# Patient Record
Sex: Female | Born: 1990
Health system: Southern US, Community
[De-identification: ages and names within clinical notes are randomized; demographics above are authoritative.]

## PROBLEM LIST (undated history)

## (undated) ENCOUNTER — Inpatient Hospital Stay (HOSPITAL_COMMUNITY): Payer: Self-pay

## (undated) DIAGNOSIS — I6350 Cerebral infarction due to unspecified occlusion or stenosis of unspecified cerebral artery: Secondary | ICD-10-CM

## (undated) DIAGNOSIS — Z9181 History of falling: Secondary | ICD-10-CM

## (undated) DIAGNOSIS — J45909 Unspecified asthma, uncomplicated: Secondary | ICD-10-CM

## (undated) DIAGNOSIS — F419 Anxiety disorder, unspecified: Secondary | ICD-10-CM

## (undated) DIAGNOSIS — K829 Disease of gallbladder, unspecified: Secondary | ICD-10-CM

## (undated) DIAGNOSIS — R531 Weakness: Secondary | ICD-10-CM

## (undated) DIAGNOSIS — F32A Depression, unspecified: Secondary | ICD-10-CM

## (undated) DIAGNOSIS — I639 Cerebral infarction, unspecified: Secondary | ICD-10-CM

## (undated) DIAGNOSIS — E669 Obesity, unspecified: Secondary | ICD-10-CM

## (undated) DIAGNOSIS — R7303 Prediabetes: Secondary | ICD-10-CM

## (undated) DIAGNOSIS — D580 Hereditary spherocytosis: Secondary | ICD-10-CM

## (undated) DIAGNOSIS — G4730 Sleep apnea, unspecified: Secondary | ICD-10-CM

## (undated) DIAGNOSIS — M419 Scoliosis, unspecified: Secondary | ICD-10-CM

## (undated) DIAGNOSIS — Z3480 Encounter for supervision of other normal pregnancy, unspecified trimester: Principal | ICD-10-CM

## (undated) DIAGNOSIS — O139 Gestational [pregnancy-induced] hypertension without significant proteinuria, unspecified trimester: Secondary | ICD-10-CM

## (undated) DIAGNOSIS — M549 Dorsalgia, unspecified: Secondary | ICD-10-CM

## (undated) DIAGNOSIS — E7800 Pure hypercholesterolemia, unspecified: Secondary | ICD-10-CM

## (undated) HISTORY — DX: Dorsalgia, unspecified: M54.9

## (undated) HISTORY — DX: Scoliosis, unspecified: M41.9

## (undated) HISTORY — DX: Neonatal cerebral infarction, unspecified side: P91.829

## (undated) HISTORY — DX: History of falling: Z91.81

## (undated) HISTORY — DX: Pure hypercholesterolemia, unspecified: E78.00

## (undated) HISTORY — DX: Obesity, unspecified: E66.9

## (undated) HISTORY — DX: Gestational (pregnancy-induced) hypertension without significant proteinuria, unspecified trimester: O13.9

## (undated) HISTORY — DX: Weakness: R53.1

## (undated) HISTORY — DX: Encounter for supervision of other normal pregnancy, unspecified trimester: Z34.80

## (undated) HISTORY — DX: Cerebral infarction, unspecified: I63.9

## (undated) HISTORY — PX: FOOT SURGERY: SHX648

## (undated) HISTORY — DX: Cerebral infarction due to unspecified occlusion or stenosis of unspecified cerebral artery: I63.50

## (undated) HISTORY — DX: Anxiety disorder, unspecified: F41.9

## (undated) HISTORY — DX: Hereditary spherocytosis: D58.0

## (undated) HISTORY — DX: Depression, unspecified: F32.A

## (undated) HISTORY — DX: Unspecified asthma, uncomplicated: J45.909

## (undated) HISTORY — PX: OTHER SURGICAL HISTORY: SHX169

## (undated) HISTORY — DX: Disease of gallbladder, unspecified: K82.9

## (undated) HISTORY — DX: Prediabetes: R73.03

---
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
## 2009-01-08 ENCOUNTER — Telehealth: Payer: Self-pay | Admitting: Internal Medicine

## 2009-01-09 ENCOUNTER — Encounter: Payer: Self-pay | Admitting: Internal Medicine

## 2009-01-16 DIAGNOSIS — M4120 Other idiopathic scoliosis, site unspecified: Secondary | ICD-10-CM | POA: Insufficient documentation

## 2009-07-03 ENCOUNTER — Ambulatory Visit: Payer: Self-pay | Admitting: Internal Medicine

## 2009-07-03 DIAGNOSIS — J029 Acute pharyngitis, unspecified: Secondary | ICD-10-CM | POA: Insufficient documentation

## 2009-07-03 DIAGNOSIS — R599 Enlarged lymph nodes, unspecified: Secondary | ICD-10-CM | POA: Insufficient documentation

## 2009-07-03 DIAGNOSIS — B2790 Infectious mononucleosis, unspecified without complication: Secondary | ICD-10-CM | POA: Insufficient documentation

## 2009-07-03 LAB — CONVERTED CEMR LAB: Rapid Strep: NEGATIVE

## 2009-07-04 ENCOUNTER — Encounter: Payer: Self-pay | Admitting: Internal Medicine

## 2009-07-15 ENCOUNTER — Ambulatory Visit: Payer: Self-pay | Admitting: Internal Medicine

## 2009-07-15 DIAGNOSIS — T50995A Adverse effect of other drugs, medicaments and biological substances, initial encounter: Secondary | ICD-10-CM | POA: Insufficient documentation

## 2009-07-15 DIAGNOSIS — R21 Rash and other nonspecific skin eruption: Secondary | ICD-10-CM | POA: Insufficient documentation

## 2009-08-12 ENCOUNTER — Ambulatory Visit: Payer: Self-pay | Admitting: Internal Medicine

## 2009-08-22 ENCOUNTER — Emergency Department (HOSPITAL_COMMUNITY): Admission: EM | Admit: 2009-08-22 | Discharge: 2009-08-22 | Payer: Self-pay | Admitting: Emergency Medicine

## 2010-03-31 ENCOUNTER — Observation Stay (HOSPITAL_COMMUNITY): Admission: AD | Admit: 2010-03-31 | Discharge: 2010-04-01 | Payer: Self-pay | Admitting: Obstetrics & Gynecology

## 2010-03-31 ENCOUNTER — Ambulatory Visit: Payer: Self-pay | Admitting: Obstetrics & Gynecology

## 2010-04-09 ENCOUNTER — Ambulatory Visit: Payer: Self-pay | Admitting: Family Medicine

## 2010-04-09 ENCOUNTER — Inpatient Hospital Stay (HOSPITAL_COMMUNITY): Admission: AD | Admit: 2010-04-09 | Discharge: 2010-04-12 | Payer: Self-pay | Admitting: Family Medicine

## 2011-02-16 ENCOUNTER — Ambulatory Visit (INDEPENDENT_AMBULATORY_CARE_PROVIDER_SITE_OTHER): Payer: PRIVATE HEALTH INSURANCE | Admitting: Internal Medicine

## 2011-02-16 ENCOUNTER — Encounter: Payer: Self-pay | Admitting: Internal Medicine

## 2011-02-16 VITALS — BP 120/80 | HR 66 | Wt 176.0 lb

## 2011-02-16 DIAGNOSIS — G8190 Hemiplegia, unspecified affecting unspecified side: Secondary | ICD-10-CM

## 2011-02-16 DIAGNOSIS — G8191 Hemiplegia, unspecified affecting right dominant side: Secondary | ICD-10-CM | POA: Insufficient documentation

## 2011-02-16 DIAGNOSIS — M25539 Pain in unspecified wrist: Secondary | ICD-10-CM

## 2011-02-16 DIAGNOSIS — S4990XA Unspecified injury of shoulder and upper arm, unspecified arm, initial encounter: Secondary | ICD-10-CM

## 2011-02-16 DIAGNOSIS — S4980XA Other specified injuries of shoulder and upper arm, unspecified arm, initial encounter: Secondary | ICD-10-CM

## 2011-02-16 NOTE — Progress Notes (Signed)
  Subjective:    Patient ID: Tina Mckee, female    DOB: 1991/06/06, 20 y.o.   MRN: [id number]  HPI  patient comes in today for the above problem. Her last visit with [place] was in 2010 since that time she has had vaginal delivery under child is now 37 months old.  She was in her usual state of health when about 3 weeks ago she slipped on ice on the steps x3 and fell mostly on her back but also on her right arm. The next day she noted pain in her right wrist and hand that is since then traveled up her forearm and has been significant. She describes it as a pain of 10 out of 10.   She doesn't remember bruising but there is swelling at her wrist arm area.   The right arm is paretic from a perinatal stroke. But she is weaker because of pain and she has having a hard time gripping doing her job and taking care of her child. Her shoulder and elbow do not hurt.  has taken an over-the-counter pain medicine without significant help. Past Medical History  Diagnosis Date  . Stroke birth    rt hemiparesis  Perinatal  . Scoliosis   . Perinatal arterial ischemic stroke    Past Surgical History  Procedure Date  . Foot surgery     rt foot    reports that she has quit smoking. She does not have any smokeless tobacco history on file. She reports that she drinks alcohol. She reports that she does not use illicit drugs. family history includes Asthma in her mother and sister and Hypertension in her father and mother.     Review of Systems  negative chest pain shortness of breath bruising bleeding other joint swelling. No history of fracture in her right arm.  Gets back pain from her scoliosis.    Objective:   Physical Exam  this is a well-nourished healthy-appearing young lady in no acute distress but has some moderate pain with her right extremity. Images a red mostly distally with some mild edema of the wrist. There is no redness or bruising. She is very tender along the medial and lateral breast and  distal forearm. Her elbow and shoulder are nontender. Examination is limited by her weakness. No obvious paresthesia and pulses are good.       Assessment & Plan:   right upper extremity injury he appears to be near the wrist and hand and a previously progress upper extremity with continuing severe pain.    She will need imaging and a consult Will refer to orthopedics as soon as possible.

## 2011-03-03 LAB — COMPREHENSIVE METABOLIC PANEL
ALT: 19 U/L (ref 0–35)
AST: 22 U/L (ref 0–37)
Albumin: 2.4 g/dL — ABNORMAL LOW (ref 3.5–5.2)
Albumin: 2.8 g/dL — ABNORMAL LOW (ref 3.5–5.2)
Albumin: 3.1 g/dL — ABNORMAL LOW (ref 3.5–5.2)
Alkaline Phosphatase: 74 U/L (ref 39–117)
Alkaline Phosphatase: 87 U/L (ref 39–117)
BUN: 11 mg/dL (ref 6–23)
BUN: 9 mg/dL (ref 6–23)
Calcium: 6.4 mg/dL — CL (ref 8.4–10.5)
Chloride: 105 mEq/L (ref 96–112)
Creatinine, Ser: 0.47 mg/dL (ref 0.4–1.2)
Creatinine, Ser: 0.57 mg/dL (ref 0.4–1.2)
GFR calc Af Amer: >60 mL/min (ref >60)
Glucose, Bld: 75 mg/dL (ref 70–99)
Glucose, Bld: 85 mg/dL (ref 70–99)
Potassium: 3.7 mEq/L (ref 3.5–5.1)
Sodium: 136 mEq/L (ref 135–145)
Total Bilirubin: 1 mg/dL (ref 0.3–1.2)
Total Protein: 4.7 g/dL — ABNORMAL LOW (ref 6.0–8.3)
Total Protein: 5.6 g/dL — ABNORMAL LOW (ref 6.0–8.3)
Total Protein: 6.2 g/dL (ref 6.0–8.3)

## 2011-03-03 LAB — CBC
HCT: 36.9 % (ref 36.0–46.0)
HCT: 39.8 % (ref 36.0–46.0)
Hemoglobin: 12.8 g/dL (ref 12.0–15.0)
Hemoglobin: 13.4 g/dL (ref 12.0–15.0)
MCHC: 34.7 g/dL (ref 30.0–36.0)
MCV: 90.6 fL (ref 78.0–100.0)
MCV: 91.4 fL (ref 78.0–100.0)
MCV: 91.6 fL (ref 78.0–100.0)
Platelets: 232 10*3/uL (ref 150–400)
Platelets: 260 10*3/uL (ref 150–400)
Platelets: 280 10*3/uL (ref 150–400)
Platelets: 283 10*3/uL (ref 150–400)
RBC: 4.16 MIL/uL (ref 3.87–5.11)
RDW: 13 % (ref 11.5–15.5)
RDW: 13.2 % (ref 11.5–15.5)
RDW: 13.2 % (ref 11.5–15.5)
WBC: 16.3 10*3/uL — ABNORMAL HIGH (ref 4.0–10.5)

## 2011-03-03 LAB — CREATININE CLEARANCE, URINE, 24 HOUR
Collection Interval-CRCL: 24 hours
Creatinine Clearance: 184 mL/min — ABNORMAL HIGH (ref 75–115)
Creatinine, 24H Ur: 1191 mg/d (ref 700–1800)
Creatinine, Urine: 50.7 mg/dL
Urine Total Volume-CRCL: 2350 mL

## 2011-03-03 LAB — URINALYSIS, DIPSTICK ONLY
Bilirubin Urine: NEGATIVE
Nitrite: NEGATIVE
Specific Gravity, Urine: 1.025 (ref 1.005–1.030)
pH: 6.5 (ref 5.0–8.0)

## 2011-03-03 LAB — RPR: RPR Ser Ql: NONREACTIVE

## 2011-03-03 LAB — RH IMMUNE GLOB WKUP(>/=20WKS)(NOT WOMEN'S HOSP): Fetal Screen: NEGATIVE

## 2011-03-03 LAB — STREP B DNA PROBE: Strep Group B Ag: NEGATIVE

## 2011-03-03 LAB — PROTEIN, URINE, 24 HOUR
Collection Interval-UPROT: 24 hours
Protein, Urine: 10 mg/dL

## 2011-03-03 LAB — URIC ACID: Uric Acid, Serum: 4.8 mg/dL (ref 2.4–7.0)

## 2011-03-03 LAB — MRSA PCR SCREENING: MRSA by PCR: NEGATIVE

## 2011-07-03 ENCOUNTER — Telehealth: Payer: Self-pay | Admitting: Internal Medicine

## 2011-07-03 NOTE — Telephone Encounter (Signed)
Per Dr. Fabian Sharp- can do prior to cpx or if she wants we can wait until the day of the visit.

## 2011-07-03 NOTE — Telephone Encounter (Signed)
Pt reschedule cpx.. There was no lab attached does pt need lab prior to cpx? Please contact.

## 2011-07-06 NOTE — Telephone Encounter (Signed)
Let message for pt to contact and schedule lab.

## 2011-07-07 ENCOUNTER — Other Ambulatory Visit: Payer: Self-pay | Admitting: Obstetrics and Gynecology

## 2011-07-07 ENCOUNTER — Ambulatory Visit: Payer: Self-pay | Admitting: Internal Medicine

## 2011-07-11 ENCOUNTER — Encounter (HOSPITAL_COMMUNITY): Payer: Self-pay | Admitting: Emergency Medicine

## 2011-07-11 ENCOUNTER — Emergency Department (HOSPITAL_COMMUNITY)
Admission: EM | Admit: 2011-07-11 | Discharge: 2011-07-11 | Disposition: A | Discharge status: Home / Self Care | Payer: PRIVATE HEALTH INSURANCE | Attending: Emergency Medicine | Admitting: Emergency Medicine

## 2011-07-11 DIAGNOSIS — Z8679 Personal history of other diseases of the circulatory system: Secondary | ICD-10-CM | POA: Insufficient documentation

## 2011-07-11 DIAGNOSIS — M2550 Pain in unspecified joint: Secondary | ICD-10-CM

## 2011-07-11 DIAGNOSIS — M25559 Pain in unspecified hip: Secondary | ICD-10-CM | POA: Insufficient documentation

## 2011-07-11 DIAGNOSIS — M25539 Pain in unspecified wrist: Secondary | ICD-10-CM | POA: Insufficient documentation

## 2011-07-11 MED ORDER — ONDANSETRON HCL 4 MG PO TABS
4.0000 mg | ORAL_TABLET | Freq: Once | ORAL | Status: AC
  Administered 2011-07-11: 4 mg via ORAL
  Filled 2011-07-11: qty 1

## 2011-07-11 MED ORDER — IBUPROFEN 800 MG PO TABS
800.0000 mg | ORAL_TABLET | Freq: Once | ORAL | Status: AC
  Administered 2011-07-11: 800 mg via ORAL
  Filled 2011-07-11: qty 1

## 2011-07-11 MED ORDER — HYDROCODONE-ACETAMINOPHEN 5-325 MG PO TABS: ORAL_TABLET | ORAL | Status: DC

## 2011-07-11 MED ORDER — HYDROCODONE-ACETAMINOPHEN 5-325 MG PO TABS
2.0000 | ORAL_TABLET | Freq: Once | ORAL | Status: AC
  Administered 2011-07-11: 2 via ORAL
  Filled 2011-07-11: qty 2

## 2011-07-11 MED ORDER — MELOXICAM 7.5 MG PO TABS: ORAL_TABLET | ORAL | Status: DC

## 2011-07-11 NOTE — ED Provider Notes (Signed)
History     Chief Complaint  Patient presents with  . Wrist Pain  . Hip Pain   HPI Comments: Pt report hx of CVA at birth involving weakness of the right upper ext. 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 She does report hx of a fall a few months ago, but not recent injury.  Patient is a 20 y.o. female presenting with wrist pain and hip pain.  Wrist Pain Associated symptoms include arthralgias, myalgias and weakness. Pertinent negatives include no abdominal pain, chest pain, coughing or neck pain.  Hip Pain Associated symptoms include arthralgias, myalgias and weakness. Pertinent negatives include no abdominal pain, chest pain, coughing or neck pain.    Past Medical History  Diagnosis Date  . Stroke birth    rt hemiparesis  Perinatal  . Scoliosis   . Perinatal arterial ischemic stroke     Past Surgical History  Procedure Date  . Foot surgery     rt foot  . Foot surgery     Bone placed    Family History  Problem Relation Age of Onset  . Hypertension Mother   . Asthma Mother   . Hypertension Father   . Asthma Sister     History  Substance Use Topics  . Smoking status: Former Smoker    Quit date: 07/11/2007  . Smokeless tobacco: Never Used  . Alcohol Use: 0.5 oz/week    1 drink(s) per week     weekends every 3 weeks    OB History    Grav Para Term Preterm Abortions TAB SAB Ect Mult Living   1 1  1             Review of Systems  Constitutional: Negative for activity change.       All ROS Neg except as noted in HPI  HENT: Negative for nosebleeds and neck pain.   Eyes: Negative for photophobia and discharge.  Respiratory: Negative for cough, shortness of breath and wheezing.   Cardiovascular: Negative for chest pain and palpitations.  Gastrointestinal: Negative for abdominal pain and blood in stool.  Genitourinary: Negative for dysuria, frequency and hematuria.  Musculoskeletal: Positive for myalgias and arthralgias.  Negative for back pain.  Skin: Negative.   Neurological: Positive for weakness. Negative for dizziness, seizures and speech difficulty.  Psychiatric/Behavioral: Negative for hallucinations and confusion.    Physical Exam  BP 139/90  Pulse 91  Temp(Src) 98.7 F (37.1 C) (Oral)  Resp 18  Ht 5\' 1"  (1.549 m)  Wt 187 lb (84.823 kg)  BMI 35.33 kg/m2  SpO2 98%  LMP 07/06/2011  Physical Exam  Nursing note and vitals reviewed. Constitutional: She is oriented to person, place, and time. She appears well-developed and well-nourished.  Non-toxic appearance.  HENT:  Head: Normocephalic.  Right Ear: Tympanic membrane and external ear normal.  Left Ear: Tympanic membrane and external ear normal.  Eyes: EOM and lids are normal. Pupils are equal, round, and reactive to light.  Neck: Normal range of motion. Neck supple. Carotid bruit is not present.  Cardiovascular: Normal rate, regular rhythm, normal heart sounds, intact distal pulses and normal pulses.   Pulmonary/Chest: Breath sounds normal. No respiratory distress.  Abdominal: Soft. Bowel sounds are normal. There is no tenderness. There is no guarding.  Musculoskeletal: Normal range of motion.  Lymphadenopathy:       Head (right side): No submandibular adenopathy present.       Head (left side): No  submandibular adenopathy present.    She has no cervical adenopathy.  Neurological: She is alert and oriented to person, place, and time. She has normal strength. No cranial nerve deficit or sensory deficit.  Skin: Skin is warm and dry.  Psychiatric: She has a normal mood and affect. Her speech is normal.    ED Course  Procedures  MDM       Kathie Dike, [place] 07/17/11 1326

## 2011-07-11 NOTE — ED Notes (Signed)
Patient c/o right wrist/hand and right hip pain. Patient states "I feel a couple of months ago and I have had pain in my wrist and hand ever since. I have been to the doctors multiple times and they can't find anything wrong. This morning I woke up and It was hurting really bad.

## 2011-07-11 NOTE — ED Notes (Signed)
Pt states that she fell in may, had xrays done at [facility] orthopedic, was told that is may be soft tissue damage, has a splint for  right wrist that pt is not wearing, , pt c/o pain to right wrist and right hip area, denies any new injury, states that the splint makes that pain worse,

## 2011-07-31 ENCOUNTER — Ambulatory Visit (INDEPENDENT_AMBULATORY_CARE_PROVIDER_SITE_OTHER): Payer: PRIVATE HEALTH INSURANCE | Admitting: Family Medicine

## 2011-07-31 ENCOUNTER — Encounter: Payer: Self-pay | Admitting: Family Medicine

## 2011-07-31 VITALS — BP 118/80 | HR 90 | Temp 98.4°F | Wt 181.0 lb

## 2011-07-31 DIAGNOSIS — M549 Dorsalgia, unspecified: Secondary | ICD-10-CM

## 2011-07-31 MED ORDER — OXYCODONE-ACETAMINOPHEN 10-325 MG PO TABS: 1.0000 | ORAL_TABLET | Freq: Four times a day (QID) | ORAL | Status: DC | PRN

## 2011-07-31 MED ORDER — PREDNISONE (PAK) 10 MG PO TABS: ORAL_TABLET | ORAL | Status: DC

## 2011-07-31 MED ORDER — KETOROLAC TROMETHAMINE 60 MG/2ML IM SOLN
60.0000 mg | Freq: Once | INTRAMUSCULAR | Status: AC
  Administered 2011-07-31: 60 mg via INTRAMUSCULAR

## 2011-07-31 NOTE — ED Provider Notes (Signed)
Medical screening examination/treatment/procedure(s) were conducted as a shared visit with non-physician practitioner(s) and myself.  I personally evaluated the patient during the encounter  Doug Sou, MD 07/31/11 [phone]

## 2011-07-31 NOTE — Progress Notes (Signed)
  Subjective:    Patient ID: Tina Mckee, female    DOB: [birth year], 20 y.o.   MRN: [id number]  HPI Here for the onset 10 days ago of severe pains in the neck and upper back which radiate down the left arm. There is a lot of spasm and stiffness in the neck and upper back. She has scoliosis and is used to chronic back pain, but this pain is new for her. No recent trauma. She is getting no relief with heat, Mobic, and Norco.    Review of Systems  Constitutional: Negative.   Musculoskeletal: Positive for back pain.       Objective:   Physical Exam  Constitutional:       In a lot of pain  Musculoskeletal:       She has scoliosis with a pronounced spinal curvature. She is very tender along the entire neck and upper back with spasm greatest along the left side. Very reduced ROM.           Assessment & Plan:  Acute pain which indicated pinched nerves along the neck and upper back, on top of scoliosis. We will write her oput of work over this weekend. Use heat. Given a Toradol shot. Try a Prednisone taper pack and Percocet for pain. Follow up with Dr. Fabian Sharp on Monday

## 2011-08-03 ENCOUNTER — Ambulatory Visit (INDEPENDENT_AMBULATORY_CARE_PROVIDER_SITE_OTHER): Payer: PRIVATE HEALTH INSURANCE | Admitting: Internal Medicine

## 2011-08-03 ENCOUNTER — Encounter: Payer: Self-pay | Admitting: Internal Medicine

## 2011-08-03 VITALS — BP 120/80 | HR 72 | Ht 62.0 in | Wt 186.0 lb

## 2011-08-03 DIAGNOSIS — G8191 Hemiplegia, unspecified affecting right dominant side: Secondary | ICD-10-CM

## 2011-08-03 DIAGNOSIS — G8190 Hemiplegia, unspecified affecting unspecified side: Secondary | ICD-10-CM

## 2011-08-03 DIAGNOSIS — Z0000 Encounter for general adult medical examination without abnormal findings: Secondary | ICD-10-CM

## 2011-08-03 DIAGNOSIS — M5412 Radiculopathy, cervical region: Secondary | ICD-10-CM | POA: Insufficient documentation

## 2011-08-03 DIAGNOSIS — M4120 Other idiopathic scoliosis, site unspecified: Secondary | ICD-10-CM

## 2011-08-03 DIAGNOSIS — IMO0002 Reserved for concepts with insufficient information to code with codable children: Secondary | ICD-10-CM

## 2011-08-03 DIAGNOSIS — Z23 Encounter for immunization: Secondary | ICD-10-CM

## 2011-08-03 LAB — LIPID PANEL
LDL Cholesterol: 82 mg/dL (ref 0–99)
Total CHOL/HDL Ratio: 3
Triglycerides: 118 mg/dL (ref 0.0–149.0)

## 2011-08-03 LAB — TSH: TSH: 0.52 u[IU]/mL (ref 0.35–5.50)

## 2011-08-03 LAB — CBC WITH DIFFERENTIAL/PLATELET
Basophils Relative: 0.2 % (ref 0.0–3.0)
Eosinophils Relative: 0 % (ref 0.0–5.0)
MCV: 88.2 fl (ref 78.0–100.0)
Monocytes Relative: 4 % (ref 3.0–12.0)
Neutrophils Relative %: 84.2 % — ABNORMAL HIGH (ref 43.0–77.0)
Platelets: 467 10*3/uL — ABNORMAL HIGH (ref 150.0–400.0)
RBC: 4.64 Mil/uL (ref 3.87–5.11)
WBC: 15.9 10*3/uL — ABNORMAL HIGH (ref 4.5–10.5)

## 2011-08-03 LAB — BASIC METABOLIC PANEL
CO2: 25 mEq/L (ref 19–32)
Calcium: 9.1 mg/dL (ref 8.4–10.5)
Chloride: 104 mEq/L (ref 96–112)
Creatinine, Ser: 0.6 mg/dL (ref 0.4–1.2)
Sodium: 140 mEq/L (ref 135–145)

## 2011-08-03 LAB — HEPATIC FUNCTION PANEL
ALT: 20 U/L (ref 0–35)
Alkaline Phosphatase: 67 U/L (ref 39–117)
Bilirubin, Direct: 0.1 mg/dL (ref 0.0–0.3)
Total Bilirubin: 0.6 mg/dL (ref 0.3–1.2)
Total Protein: 7 g/dL (ref 6.0–8.3)

## 2011-08-03 NOTE — Patient Instructions (Signed)
I agree with seeing specialist about your arm and neck pain and numbness.  Will notify you  of labs when available. When convenient can schedule for pap and pelvic exam .  lifestyle intervention healthy eating and exercise  In the meantime

## 2011-08-03 NOTE — Progress Notes (Signed)
Subjective:    Patient ID: Tina Mckee, female    DOB: 06/13/1991, 20 y.o.   MRN: [id number]  HPI Patient comes in today for preventive visit and followup of medical problems. She was seen by Dr. Clent Ridges last week for increasing neck pain left arm pain and numbness. 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  And then  Having left arm pain and numbness left arm  that has progressed to the point of more severe pain affecting her sleep. Sometimes it  feels weak and then hip hurts. She was placed on prednisone and pain medication. She's been on this at least 3 days Not better at all.(  Hip and arm still hurts from fall in   February. ) Denies any weakness new in her leg. Her right hemiparesis from birth is no change. Has been difficult at work to do her job because of the above.   Her last Pap smear was before her child was born and she is 33 months old. No history of abnormals.   Is on her period today so not doing Pap. Uses Camila and condoms for contraception she does not use tobacco.   Review of Systems ROS:  GEN/ HEENTNo fever, significant weight changes sweats headaches vision problems hearing changes, CV/ PULM; No chest pain shortness of breath cough, syncope,edema  change in exercise tolerance. GI /GU: No adominal pain, vomiting, change in bowel habits. No blood in the stool. No significant GU symptoms. SKIN/HEME: ,no acute skin rashes suspicious lesions or bleeding. No lymphadenopathy, nodules, masses.  NEURO/ PSYCH:   No depression anxiety. Except as related to above IMM/ Allergy: No unusual infections.  Allergy .   REST of 12 system review negative Past history family history social history reviewed in the electronic medical record. Past Medical History  Diagnosis Date  . Stroke birth    rt hemiparesis  Perinatal  . Scoliosis   . Perinatal arterial ischemic stroke    Past Surgical History  Procedure Date  . Foot surgery     rt foot  . Foot surgery     Bone placed    reports that  she quit smoking about 4 years ago. She has never used smokeless tobacco. She reports that she drinks about .5 ounces of alcohol per week. She reports that she does not use illicit drugs. family history includes Asthma in her mother and sister and Hypertension in her father and mother. Allergies  Allergen Reactions  . Cephalexin     REACTION: rash  with mono        Objective:   Physical Exam Physical Exam: Vital signs reviewed WUJ:WJXB is a  well-nourished alert cooperative  white female who appears her stated age in no acute distress. mild discomfort here with her 55 m old. Has right hemiparesis and weakness and some atrophy right arm.  HEENT: normocephalic  traumatic , Eyes: PERRL EOM's full, conjunctiva clear, Nares: paten,t no deformity discharge or tenderness., Ears: no deformity EAC's clear TMs with normal landmarks. Mouth: clear OP, no lesions, edema.  Moist mucous membranes. Dentition in adequate repair. NECK: supple without masses, thyromegaly or bruits. Tender left neck trapezius CHEST/PULM:  Clear to auscultation and percussion breath sounds equal no wheeze , rales or rhonchi. No chest wall deformities or tenderness. CV: PMI is nondisplaced, S1 S2 no gallops, murmurs, rubs. Peripheral pulses are full without delay.No JVD .  Breast: normal by inspection . No dimpling, discharge, masses, tenderness or discharge . LN: no cervical  axillary inguinal adenopathy ABDOMEN: Bowel sounds normal nontender  No guard or rebound, no hepato splenomegal no CVA tenderness.  No hernia. Extremtities:  No clubbing cyanosis or edema, no acute joint swelling or redness right UE with some atrophy strength dec right ? Seems normal left . NEURO:  Oriented x3, cranial nerves 3-12 appear to be intact, right  Hemiparesis rue more than le.    Reflex brisker on right more than left one beat clonus right foot.  SKIN: No acute rashes normal turgor, color, no bruising or petechiae. Has facial erythema no papules    PSYCH: Oriented, good eye contact, no obvious depression anxiety, cognition and judgment appear normal. Some stress with pain issue     Assessment & Plan:  Preventive Health Care Counseled regarding healthy nutrition, exercise, sleep, injury prevention, calcium vit d and healthy weight .  Contraception  Return when convenient for pap/pelvic   get cpx labs today realizing she is on prednisone  Could change wbc and bg .  Left neck pain with radiating arm pain and numbness  Concern for radiculopathy  In her normal extremity. ( right hemiparesis)  No response to pred at this time. Disc referral and will have Dr Ophelia Charter see her tomorrow   And management as per her.   Stable right hemiparesis from  Perinatal cva.

## 2011-08-05 ENCOUNTER — Encounter: Payer: Self-pay | Admitting: *Deleted

## 2011-09-10 ENCOUNTER — Telehealth: Payer: Self-pay | Admitting: Internal Medicine

## 2011-09-10 NOTE — Telephone Encounter (Signed)
Pt was recently in for her physical but could not receive a pap because of her menstrual cycle. Pt is wanting to come back in to receive her pap. How does this need to be scheduled?

## 2011-09-10 NOTE — Telephone Encounter (Signed)
Please schedule her in a 15 min pap only appt per me.

## 2011-09-21 ENCOUNTER — Encounter: Payer: Self-pay | Admitting: Internal Medicine

## 2011-09-21 ENCOUNTER — Other Ambulatory Visit (HOSPITAL_COMMUNITY)
Admission: RE | Admit: 2011-09-21 | Discharge: 2011-09-21 | Disposition: A | Discharge status: Home / Self Care | Payer: PRIVATE HEALTH INSURANCE | Source: Ambulatory Visit | Attending: Internal Medicine | Admitting: Internal Medicine

## 2011-09-21 ENCOUNTER — Ambulatory Visit (INDEPENDENT_AMBULATORY_CARE_PROVIDER_SITE_OTHER): Payer: PRIVATE HEALTH INSURANCE | Admitting: Internal Medicine

## 2011-09-21 DIAGNOSIS — Z0000 Encounter for general adult medical examination without abnormal findings: Secondary | ICD-10-CM

## 2011-09-21 DIAGNOSIS — R739 Hyperglycemia, unspecified: Secondary | ICD-10-CM

## 2011-09-21 DIAGNOSIS — Z01419 Encounter for gynecological examination (general) (routine) without abnormal findings: Secondary | ICD-10-CM | POA: Insufficient documentation

## 2011-09-21 DIAGNOSIS — IMO0002 Reserved for concepts with insufficient information to code with codable children: Secondary | ICD-10-CM

## 2011-09-21 DIAGNOSIS — Z23 Encounter for immunization: Secondary | ICD-10-CM

## 2011-09-21 DIAGNOSIS — R7309 Other abnormal glucose: Secondary | ICD-10-CM | POA: Insufficient documentation

## 2011-09-21 DIAGNOSIS — M4120 Other idiopathic scoliosis, site unspecified: Secondary | ICD-10-CM

## 2011-09-21 DIAGNOSIS — M5412 Radiculopathy, cervical region: Secondary | ICD-10-CM

## 2011-09-21 DIAGNOSIS — R296 Repeated falls: Secondary | ICD-10-CM | POA: Insufficient documentation

## 2011-09-21 DIAGNOSIS — G8190 Hemiplegia, unspecified affecting unspecified side: Secondary | ICD-10-CM

## 2011-09-21 DIAGNOSIS — R279 Unspecified lack of coordination: Secondary | ICD-10-CM

## 2011-09-21 DIAGNOSIS — W19XXXA Unspecified fall, initial encounter: Secondary | ICD-10-CM

## 2011-09-21 DIAGNOSIS — G8191 Hemiplegia, unspecified affecting right dominant side: Secondary | ICD-10-CM

## 2011-09-21 LAB — GLUCOSE, POCT (MANUAL RESULT ENTRY): POC Glucose: 105

## 2011-09-21 MED ORDER — NORETHINDRONE 0.35 MG PO TABS: 1.0000 | ORAL_TABLET | Freq: Every day | ORAL | Status: DC

## 2011-09-21 NOTE — Progress Notes (Signed)
  Subjective:    Patient ID: Tina Mckee, female    DOB: 11-12-1991, 20 y.o.   MRN: [id number]  HPI Comes in today for GYNE exam and also follow up of ms pain.   Since last visit has seen Dr Ophelia Charter and had  x rays done.  So far treatment  Plan  not  Helpful in getting better .  Taking pain meds but has pain in  left side continuing  ; after fall on right.  Has cut back hours of work because of this.  No period issues at present . Needs refill of camila.  recurrent falling even at home unsure why she is doing this now.  Used to see Dr Sharene Skeans last time in HS .  No headache new numbness . Concerned because of her 26 month old at home.    Review of Systems Neg cp sob fever uti other gu sx . Pain back  No head injury, no change in vision  Hearing unusual headaches . Joint swelling bleeding.  Past history family history social history reviewed in the electronic medical record.     Objective:   Physical Exam  WDWN in nad Abdomen:  Sof,t normal bowel sounds without hepatosplenomegaly, no guarding rebound or masses no CVA tenderness Oriented x 3 right hemiparesis some spasticity  Or right side no tremor  Pelvic: NL ext GU, labia clear without lesions or rash . Vagina no lesions .Cervix: clear  UTERUS: Neg CMT Adnexa:  clear no masses . PAP done. Labs reviewed with patient. bg fbs 105      Assessment & Plan:  Nl gyne exam pap done  Persistent pain after fall .  Recurring falling  Out of range of normal per patient.   Advise neuro consult   May need to transition to adult neurologist ( used to see Dr Sharene Skeans  And he may be only seeing peds patients at this time)   Hyperglycemia  Fasting   Counseled. About prevention of getting diabetes etc. Stable hemiparesis

## 2011-09-21 NOTE — Patient Instructions (Addendum)
Avoid simple sugars and sweets to  Avoid getting diabetes. Will get  Neuro consult Will notify you  of PAP when available. Check up in a year or as needed

## 2011-09-23 ENCOUNTER — Encounter: Payer: Self-pay | Admitting: Internal Medicine

## 2011-09-23 DIAGNOSIS — R739 Hyperglycemia, unspecified: Secondary | ICD-10-CM | POA: Insufficient documentation

## 2011-09-23 NOTE — Progress Notes (Signed)
Quick Note:  Tell patient PAP is normal. ______ 

## 2011-09-24 ENCOUNTER — Encounter: Payer: Self-pay | Admitting: *Deleted

## 2012-04-08 ENCOUNTER — Telehealth: Payer: Self-pay | Admitting: Internal Medicine

## 2012-04-08 NOTE — Telephone Encounter (Signed)
Pt needs a doctors note stating that she had a cpx in August of last year, for her work. Pt also needs to get the doctors report from that visit. Pls fax to [facility] : attn Marianne Sofia at Fax # [phone].

## 2012-04-08 NOTE — Telephone Encounter (Signed)
Pls advise.  

## 2012-04-08 NOTE — Telephone Encounter (Signed)
Ok to do what patient asks for . Need to sign relase for her notes.  ( 2 visits)  (NO t sure why I need to give the ok for this)

## 2012-04-11 ENCOUNTER — Ambulatory Visit (INDEPENDENT_AMBULATORY_CARE_PROVIDER_SITE_OTHER): Payer: PRIVATE HEALTH INSURANCE | Admitting: Internal Medicine

## 2012-04-11 ENCOUNTER — Telehealth: Payer: Self-pay | Admitting: Internal Medicine

## 2012-04-11 ENCOUNTER — Encounter: Payer: Self-pay | Admitting: Internal Medicine

## 2012-04-11 VITALS — BP 128/86 | HR 94 | Temp 98.2°F | Wt 195.0 lb

## 2012-04-11 DIAGNOSIS — G8190 Hemiplegia, unspecified affecting unspecified side: Secondary | ICD-10-CM

## 2012-04-11 DIAGNOSIS — R739 Hyperglycemia, unspecified: Secondary | ICD-10-CM

## 2012-04-11 DIAGNOSIS — G8191 Hemiplegia, unspecified affecting right dominant side: Secondary | ICD-10-CM

## 2012-04-11 DIAGNOSIS — N390 Urinary tract infection, site not specified: Secondary | ICD-10-CM

## 2012-04-11 DIAGNOSIS — R609 Edema, unspecified: Secondary | ICD-10-CM

## 2012-04-11 DIAGNOSIS — R7309 Other abnormal glucose: Secondary | ICD-10-CM

## 2012-04-11 LAB — POCT URINALYSIS DIPSTICK
Nitrite, UA: POSITIVE
Protein, UA: NEGATIVE
Spec Grav, UA: 1.025
Urobilinogen, UA: 1

## 2012-04-11 NOTE — Telephone Encounter (Addendum)
Triage call:  Pt states that both her hands are swollen up to wrist with tingling since last night.  No other symtoms.   Please let me know when we can see her? Phone note given to [facility] to discuss with Dr. Fabian Sharp.

## 2012-04-11 NOTE — Telephone Encounter (Signed)
Pt came into the office to sign release for paperwork.

## 2012-04-11 NOTE — Telephone Encounter (Signed)
Pt hands are swollen requesting ov today.

## 2012-04-11 NOTE — Telephone Encounter (Signed)
Pt is sch with Dr Fabian Sharp 3pm

## 2012-04-11 NOTE — Progress Notes (Signed)
  Subjective:    Patient ID: Tina Mckee, female    DOB: [birth year], 21 y.o.   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 She had noted the onset yesterday evening of some swelling in her hands after her daughter's 42-year-old birthday party. She went to bed and this morning had puffy hands but was associated with tingly feeling and harder to feel but no severe pain she also noted that her feet are puffy. She has taken no medication for this. No injury but does work at [employer] as a Conservation officer, nature. She only works a few days a week.  She has not had this problem before although  No chest pain new shortness of breath swelling in her face high as cough chest pain vomiting diarrhea or falling. She has not taken Loxitane on for about 3 weeks. She is not on narcotics for her pain.  She is on Camila. R. irregular missing at times. Notes that her blood pressure has been borderline elevated and she had a history of preeclampsia with her pregnancy and that her glucose was slightly elevated but she had labs done through her husband's wellness lab at his work. She realizes she has gained weight over the last year and should lose some.   Review of Systems No fever weight loss vision changes her right hemiparesthesias is about the same although sometimes feels weaker. 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 Back pain. No uti sx or blood in urine  Past history family history social history reviewed in the electronic medical record.     Objective:   Physical Exam BP 128/86  Pulse 94  Temp(Src) 98.2 F (36.8 C) (Oral)  Wt 195 lb (88.451 kg)  SpO2 98%  Bp left arm 140/88 reg cuff  Sitting Right hemiparesis mild   HEETN op clear  eyes clear  Neck: Supple without adenopathy or masses or bruits Chest:  Clear to A&P without wheezes rales or rhonchi CV:  S1-S2 no gallops or murmurs  peripheral perfusion is normal Abdomen:  Sof,t normal bowel sounds without hepatosplenomegaly, no guarding rebound or masses no CVA tenderness Extr Puffiness on bothe hands lweft for than right  ( some right hemiparesis and atrophy noted old) also puffiness in feet        Assessment & Plan:   swelling ; Uncertain cause; ok exam except for puffiness in hands and feet. Seems more in her left non-paretic hand than the right. cv seem intact borderline elevated bp  Today.; Need to check labs to check renal function liver thyroid etc. We'll also check a UCG.   Blood pressure borderline today history of hyperglycemia labs pending. Do not think that this is related to above. She did have preeclampsia in her pregnancy that required IV medication.

## 2012-04-11 NOTE — Telephone Encounter (Signed)
Left a detailed message for pt stating that a release needs to be signed for her office notes.

## 2012-04-11 NOTE — Telephone Encounter (Signed)
Nettie Elm had asked me to schedule this pt at 3 pm today, but there is no answer.  Left message to call us back.  She wanted to  See her at 3 pm.

## 2012-04-11 NOTE — Patient Instructions (Addendum)
Uncertain why you are swelling. However your heart and lung sounds normal and I see no other new abnormalities on your exam today.  I would like you to avoid taking Mobic and anti-inflammatories in case that causes fluid retention.  can take Tylenol if you need for pain.  A feeding salt and sodium in your food would be helpful at this time.  If this is not improving we can consider a diuretic pill.   We will notify you when labs are back.    Plan followup visit in  2-3 weeks or earlier if it is getting worse.

## 2012-04-12 LAB — CBC WITH DIFFERENTIAL/PLATELET
Basophils Absolute: 0 10*3/uL (ref 0.0–0.1)
Eosinophils Relative: 2.8 % (ref 0.0–5.0)
HCT: 44.2 % (ref 36.0–46.0)
Hemoglobin: 15.1 g/dL — ABNORMAL HIGH (ref 12.0–15.0)
Lymphs Abs: 3.1 10*3/uL (ref 0.7–4.0)
MCV: 87.7 fl (ref 78.0–100.0)
Monocytes Absolute: 0.6 10*3/uL (ref 0.1–1.0)
Monocytes Relative: 5.5 % (ref 3.0–12.0)
Neutro Abs: 6.4 10*3/uL (ref 1.4–7.7)
RDW: 12.5 % (ref 11.5–14.6)

## 2012-04-12 LAB — HEPATIC FUNCTION PANEL
ALT: 24 U/L (ref 0–35)
AST: 26 U/L (ref 0–37)
Albumin: 4.8 g/dL (ref 3.5–5.2)

## 2012-04-12 LAB — T4, FREE: Free T4: 0.72 ng/dL (ref 0.60–1.60)

## 2012-04-12 LAB — BASIC METABOLIC PANEL
BUN: 11 mg/dL (ref 6–23)
CO2: 22 mEq/L (ref 19–32)
Chloride: 106 mEq/L (ref 96–112)
GFR: 155.32 mL/min (ref >60.00)
Glucose, Bld: 72 mg/dL (ref 70–99)
Potassium: 3.9 mEq/L (ref 3.5–5.1)
Sodium: 141 mEq/L (ref 135–145)

## 2012-04-12 LAB — TSH: TSH: 1.4 u[IU]/mL (ref 0.35–5.50)

## 2012-04-12 NOTE — Telephone Encounter (Signed)
Pt was in on 04/11/12 and signed release.

## 2012-04-13 LAB — URINE CULTURE: Colony Count: >=100000

## 2012-04-13 NOTE — Progress Notes (Signed)
Quick Note:  Left a message for pt to return call. ______ 

## 2012-04-14 MED ORDER — AMOXICILLIN 500 MG PO CAPS: 500.0000 mg | ORAL_CAPSULE | Freq: Three times a day (TID) | ORAL | Status: AC

## 2012-04-14 NOTE — Progress Notes (Signed)
Quick Note:  Pt aware ______ 

## 2012-04-14 NOTE — Progress Notes (Signed)
Addended by: Azucena Freed on: 04/14/2012 08:06 AM   Modules accepted: Orders

## 2012-04-14 NOTE — Progress Notes (Signed)
Quick Note:  Pt aware of lab results and states she can take either penicillin or amoxicillin. ______

## 2012-04-22 ENCOUNTER — Ambulatory Visit (INDEPENDENT_AMBULATORY_CARE_PROVIDER_SITE_OTHER): Payer: BC Managed Care – PPO | Admitting: Internal Medicine

## 2012-04-22 ENCOUNTER — Encounter: Payer: Self-pay | Admitting: Internal Medicine

## 2012-04-22 VITALS — BP 114/90 | HR 91 | Temp 98.4°F | Wt 190.0 lb

## 2012-04-22 DIAGNOSIS — G8190 Hemiplegia, unspecified affecting unspecified side: Secondary | ICD-10-CM

## 2012-04-22 DIAGNOSIS — R609 Edema, unspecified: Secondary | ICD-10-CM

## 2012-04-22 DIAGNOSIS — R259 Unspecified abnormal involuntary movements: Secondary | ICD-10-CM

## 2012-04-22 DIAGNOSIS — N390 Urinary tract infection, site not specified: Secondary | ICD-10-CM

## 2012-04-22 DIAGNOSIS — R251 Tremor, unspecified: Secondary | ICD-10-CM

## 2012-04-22 DIAGNOSIS — G8191 Hemiplegia, unspecified affecting right dominant side: Secondary | ICD-10-CM

## 2012-04-22 LAB — POCT URINALYSIS DIP (MANUAL ENTRY)
Bilirubin, UA: NEGATIVE
Glucose, UA: NEGATIVE
Ketones, POC UA: NEGATIVE
Protein Ur, POC: NEGATIVE
Spec Grav, UA: >=1.03

## 2012-04-22 NOTE — Progress Notes (Signed)
  Subjective:    Patient ID: Tina Mckee, female    DOB: 04-01-1991, 21 y.o.   MRN: [id number]  HPI Patient comes in for followup as recommended. She was seen a few weeks ago for swelling and what she calls shaking. Since that time she had laboratory studies done swelling is a little bit better but she still has tremors and shaking at time mostly with her left arm. She states it could be from anxiety but she has to do lifting with her small child on her nonaffected arm. She may be getting some tremors in her right arm also which is the paretic arm. She has decreased her soda and caffeine some but not totally. She has limited her stop the anti-inflammatory  Her laboratory studies shows that she had a group B strep UTI so we began her on amoxicillin. Review of Systems Negative for chest pain shortness of breath recent syncope or numbness. Past history family history social history reviewed in the electronic medical record.     Objective:   Physical Exam BP 114/90  Pulse 91  Temp(Src) 98.4 F (36.9 C) (Oral)  Wt 190 lb (86.183 kg)  SpO2 96% Well-nourished alert in no acute distress here with her small child. She may have mild anxiety. Right upper arm paretic no tremor left upper extremity minimal puffiness no significant edema redness.  Laboratory studies reviewed with patient individually.       Assessment & Plan:  Swelling seems to be some better does not look like she has a cause of renal disease cardiac disease or liver disease.  Is not taking an anti-inflammatory this time which may have helped.  UTI with group B strep without significant symptoms except for swelling.  Tremo/r shaking does not sound like a seizure could be anxiety but with hemiparesis  and history of neck pain and falling.   I recommend we proceed with a neurology consul.

## 2012-04-22 NOTE — Patient Instructions (Signed)
The tremor may be  From anxiety but   i would like the neurology opinion on this  For the reasons we discussed .  Continue avoiding.    Sodium salt and  Antiinflammatory.    Someone will contact you in about the referral. Contact us if  Getting worse.

## 2012-04-28 ENCOUNTER — Encounter: Payer: Self-pay | Admitting: Neurology

## 2012-05-26 ENCOUNTER — Ambulatory Visit (INDEPENDENT_AMBULATORY_CARE_PROVIDER_SITE_OTHER): Payer: BC Managed Care – PPO | Admitting: Neurology

## 2012-05-26 ENCOUNTER — Encounter: Payer: Self-pay | Admitting: Neurology

## 2012-05-26 VITALS — BP 118/80 | HR 88 | Wt 194.0 lb

## 2012-05-26 DIAGNOSIS — R569 Unspecified convulsions: Secondary | ICD-10-CM

## 2012-05-26 MED ORDER — LEVETIRACETAM 250 MG PO TABS: 750.0000 mg | ORAL_TABLET | Freq: Two times a day (BID) | ORAL | Status: DC

## 2012-05-26 NOTE — Patient Instructions (Addendum)
Your MRI is scheduled for Thursday, June 20 at 8:00am.  Please arrive to [facility], first floor admitting by 7:45am  [phone].  Your EEG is also scheduled for Thursday, June 20 at 9:00am.   [phone].   Titration to Keppra(levetiracetam) 750mg  twice a day using  Keppra 250mg  tablets.   Morning Dose Evening Dose  Week 1 0 tablets  1 tablet (250mg )  Week 2 1 tablet (250mg ) 1 tablet (250mg )  Week 3 1 tablet (250mg ) 2 tablets (500mg )  Week 4 2 tablets (500mg ) 2 tablets (500mg )  Week 5 2 tablets (500mg ) 3 tablets (750mg )  Week 6 3 tablets (750mg ) 3 tablets (750mg )  After Week 6 continue at 3 tablets twice per day. Titration requires 147 tablets 250mg  tablets.

## 2012-05-26 NOTE — Progress Notes (Signed)
Dear Dr. Fabian Sharp,  Thank you for having me see Tina Mckee in consultation today at [facility] for her problem with frequent twitching movements of her arms.  As you may recall, she is a 21 y.o. 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  She says that it is continuous, but at times it wakes her out of sleep, with a feeling of her heart pounding.  She has had chronic twitching of her right foot for years, but notes that it has gotten worse.  She sometimes gets intermittent numbness of her right face.  Interestingly, she doesn't think the jerking in her right foot occurs at the same time as the jerking in her left hand.  There is some minor pain involved in her left arm at times.  She also gets what sounds like dystonic cramping of her left foot particularly at night.  She is wondering if it is related to anxiety.  She has noted no precipitating factors that may have explained her exacerbation.  She has use of her right arm and leg.  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  She has not noted a worsening in her function.  Past Medical History  Diagnosis Date  . Stroke birth    rt hemiparesis  Perinatal  . Scoliosis   . Perinatal arterial ischemic stroke     Past Surgical History  Procedure Date  . Foot surgery     rt foot  . Foot surgery     Bone placed    History   Social History  . Marital Status: Single    Spouse Name: N/A    Number of Children: N/A  . Years of Education: N/A   Social History Main Topics  . Smoking status: Former Smoker    Quit date: 07/11/2007  . Smokeless tobacco: Never Used  . Alcohol Use: 0.5 oz/week    1 drink(s) per week     weekends every 3 weeks  . Drug Use: No  . Sexually Active: Yes    Birth Control/ Protection: Pill   Other Topics Concern  . 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  Currently nonsmoking HH of 3 no pets  had mild preeclampsia with vaginal childbirth.  2011    Family History  Problem Relation Age of Onset  . Hypertension Mother   . Asthma Mother   . Hypertension Father   . Asthma Sister     Current Outpatient Prescriptions on File Prior to Visit  Medication Sig Dispense Refill  . acetaminophen (TYLENOL) 500 MG tablet Take 1,000 mg by mouth every 6 (six) hours as needed. Pain       . Multiple Vitamins-Minerals (WOMENS MULTIVITAMIN PLUS PO) Take 1 tablet by mouth daily.        . norethindrone (CAMILA) 0.35 MG tablet Take 1 tablet (0.35 mg total) by mouth daily.  1 Package  12  . levETIRAcetam (KEPPRA) 250 MG tablet Take 3 tablets (750 mg total) by mouth 2 (two) times daily. Please increase as per titration schedule.  180 tablet  3    Allergies  Allergen Reactions  . Cephalexin     REACTION: rash  with mono  ROS:  13 systems were reviewed and are notable for a history of back pain, particularly when she was a CNA.  It has improved.  She also has a history of migraine headaches, for which she takes excedrin migraine for with modest success.  All other review of systems are unremarkable.   Examination:  Filed Vitals:   05/26/12 1011  BP: 118/80  Pulse: 88  Weight: 194 lb (87.998 kg)     In general, well nourished appearing women.  H&N:  obvious right facial hypoplasia  Cardiovascular: The patient has a regular rate and rhythm and no carotid bruits.  Fundoscopy:  Disks are flat. Vessel caliber within normal limits.  Mental status:   The patient is oriented to person, place and time. Recent and remote memory are intact. Attention span and concentration are normal. Language including repetition, naming, following commands are intact. Fund of knowledge of current and historical events, as well as vocabulary are normal.  Cranial Nerves: Pupils  are equally round and reactive to light. Visual fields full to confrontation. EOMs are full. Facial sensation and muscles of mastication are intact. Muscles of facial expression move symmetrically. Hearing intact to bilateral finger rub. Tongue protrusion, uvula, palate midline.  Shoulder shrug intact  Motor:  The patient has mildly increased spasticity in her RUE, but it is mild. She has continuous asynchronous movements of her right foot, that are myoclonic, and not like tremor.  They have different directions of action.  She has similar movements of her left hand, which are not as high amplitude and no synchronous with her right foot.  I do not see similar movements in her right hand.  She does have jerking movements of her right arm, asynchronously when she holds it in place - mainly elbow extension movements.  I do not notice movements of right or left face.  5/5 muscle strength bilaterally except for the intrinsic muscles of her right hand which are 3/5.    Reflexes:  Brisk on the right, 2+ left.  Right toe up, left toe down.  Coordination:  Normal finger to nose.  No dysdiadokinesia.  Sensation is decreased to light touch on the right, particularly in arm and leg.  Gait and Station are hemiparetic although she walks quite well.   Impression/Recs: 1.  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  However, they are not stereotyped, but this can happen in [facility].  The foot is also an unusual area to be affected.  Other differentials are tremor, dystonic tremor, and myoclonus.  I am going to go ahead and start the patient on Keppra to increase to 750 bid.  She will also get a routine EEG and MRI brain to see the area of involvement.   We will see the patient back in 1 month.  Thank you for having [place] see Tina Mckee in consultation.  Feel free to contact me  with any questions.  Lupita Raider Modesto Charon, MD [facility], [place] [address] [place], [place] [address] Phone: [phone] Fax: [phone].

## 2012-06-02 ENCOUNTER — Ambulatory Visit (HOSPITAL_COMMUNITY)
Admission: RE | Admit: 2012-06-02 | Discharge: 2012-06-02 | Disposition: A | Discharge status: Home / Self Care | Payer: BC Managed Care – PPO | Source: Ambulatory Visit | Attending: Neurology | Admitting: Neurology

## 2012-06-02 DIAGNOSIS — R569 Unspecified convulsions: Secondary | ICD-10-CM

## 2012-06-02 DIAGNOSIS — G9389 Other specified disorders of brain: Secondary | ICD-10-CM | POA: Insufficient documentation

## 2012-06-06 ENCOUNTER — Telehealth: Payer: Self-pay | Admitting: Neurology

## 2012-06-06 NOTE — Telephone Encounter (Signed)
Spoke with [employer]. Info given as per Dr. Modesto Charon below. She reports that she is still twitching but is a little less since starting the medication. She is still in the titration phase of the Keppra and currently taking one in the am and one in the pm. This Thursday she will increase to one in the am and two in the pm. She is tolerating the Keppra ok but states she feels tired and is sleeping better at night. She says she can deal with the fatigue as long as the twitching gets better. I told her I would let Dr. Modesto Charon know. **Dr. Modesto Charon, [place] Childes.Marland KitchenMarland Kitchen

## 2012-06-06 NOTE — Telephone Encounter (Signed)
Left a message for the patient to return my call.  

## 2012-06-06 NOTE — Telephone Encounter (Signed)
Message copied by Benay Spice on Mon Jun 06, 2012  2:39 PM ------      Message from: Milas Gain      Created: Mon Jun 06, 2012  2:25 PM       Let Saryn know that her MRI showed the old damage from her stroke but nothing that would supprot new damage.  The EEG was abnormal, and I think it is very likely that the twitching events are seizures.  Has she noted any improvement on the medication?

## 2012-06-08 NOTE — Procedures (Signed)
EEG NUMBER:  [id number]  This routine EEG was requested in this 21 year old female who has a history of a left hemispheric perinatal stroke.  She started having bilateral trembling involving her left side.  The purpose of this EEG is to determine if these trembling episodes were secondary to seizures.  The EEG was done with the patient awake.  During periods of maximal wakefulness, she had a 10 cycle per second posterior dominant rhythm that attenuated with eye opening and did appear largely symmetric.  Note was made of bilateral beta activities that were frontally dominant. However, in addition, there was delta and theta slowing, greatest in the left posterior quadrant in particular in the left central parietal region.  There were frequent spells of right leg and right arm jerking throughout the tracing.  Some of the right arm jerks did seem to correlate with the onset at the intermittent slowing in the left central parietal region.  Slowing at times did seem to also involve the right central region sometimes synchronously with the left central region as well as sometimes on its own.  The spells of the left arm trembling does not seem to have clear correlate.  No clear interictal epileptiform discharges were seen.  The patient did not sleep.  CLINICAL INTERPRETATION:  This routine EEG done with the patient awake is abnormal.  Intermittent arrhythmic theta and delta range slowing were seen in particular in the left central parietal regions.  Sometimes, these seemed to correlate with right arm jerking.  Left arm jerks which were much less frequent, did not seem to have a clear correlate.  However, it is felt that these jerking episodes are likely epileptic in nature.          ______________________________ Denton Meek, MD    [id number] D:  06/02/2012 16:22:41  T:  06/02/2012 16:48:52  Job #:  [id number]

## 2012-06-29 ENCOUNTER — Encounter: Payer: Self-pay | Admitting: Neurology

## 2012-06-29 ENCOUNTER — Ambulatory Visit (INDEPENDENT_AMBULATORY_CARE_PROVIDER_SITE_OTHER): Payer: BC Managed Care – PPO | Admitting: Neurology

## 2012-06-29 VITALS — BP 118/76 | HR 76 | Wt 195.0 lb

## 2012-06-29 DIAGNOSIS — R251 Tremor, unspecified: Secondary | ICD-10-CM

## 2012-06-29 DIAGNOSIS — R259 Unspecified abnormal involuntary movements: Secondary | ICD-10-CM

## 2012-06-29 DIAGNOSIS — G8190 Hemiplegia, unspecified affecting unspecified side: Secondary | ICD-10-CM

## 2012-06-29 DIAGNOSIS — G8191 Hemiplegia, unspecified affecting right dominant side: Secondary | ICD-10-CM

## 2012-06-29 MED ORDER — CLONAZEPAM 0.5 MG PO TABS: 1.0000 mg | ORAL_TABLET | Freq: Two times a day (BID) | ORAL | Status: DC

## 2012-06-29 NOTE — Progress Notes (Signed)
Dear Dr. Fabian Sharp,  I saw  Tina Mckee back in [place] [facility] for her problem with congenital right hemiparesis and possible EPC .  As you may recall, she is a 21 y.o. year old female with a history of congenital left MCA ischemic stroke who has right hemiparesis who has a long history of arrhythmic movements of her right foot, who over the last several months has developed shaking of her left arm and hand.  I felt that the movements in her right foot were likely EPC, and felt that the left hand movements may be epileptic in origin as well.  An MRI brain revealed old left MCA encephalomalacia, while an EEG revealed intermittent theta and delta range slowing in the left central parietal area, that I felt might correlate with the right sided leg movements.  Left sided movements did not have an EEG correlate.  I started her on Keppra and increased it to 750mg  bid.  She feels that the movements on the right side are better, but the movements on the left side are equivocally better.  Unfortunately she feels the Keppra is making her sleepy as well as causing spells of diaphoresis and dizziness that can last minutes.  These spells may occur about once per week.  She did not notice them before starting the Keppra.  Medical history, social history, and family history were reviewed and have not changed since the last clinic visit.  Current Outpatient Prescriptions on File Prior to Visit  Medication Sig Dispense Refill  . acetaminophen (TYLENOL) 500 MG tablet Take 1,000 mg by mouth every 6 (six) hours as needed. Pain       . levETIRAcetam (KEPPRA) 250 MG tablet Take 3 tablets (750 mg total) by mouth 2 (two) times daily. Please increase as per titration schedule.  180 tablet  3  . Multiple Vitamins-Minerals (WOMENS MULTIVITAMIN PLUS PO) Take 1 tablet by mouth daily.        . norethindrone (CAMILA) 0.35 MG tablet Take 1 tablet (0.35 mg total) by mouth daily.  1 Package  12  .        Allergies    Allergen Reactions  . Cephalexin     REACTION: rash  with mono    ROS:  13 systems were reviewed andare unremarkable.  Exam: . Filed Vitals:   06/29/12 0942  BP: 118/76  Pulse: 76  Weight: 195 lb (88.451 kg)    In general, well appearing obese women.   Motor:  Right spastic hemiparesis, there are frequent inversion movements of the foot, sometimes with a brief myoclonic component.  The movements in the left hand and forearm are different.  Most of the time they look like a high frequency low amplitude tremor, but at other times there is a brief myoclonic component   Impression/Recommendations:  1.  Abnormal movements - I am unsure as to the nature of the left hand movements.  The more I look at them, the less I think they are a seizure phenomenon.  I do think the right foot movements may be EPC, but certainly an this could be athetosis as well.  I am going to wean her off her Keppra to see if her nausea gets better.  When she is off the Keppra she will start clonazepam with the goal of helping the left hand tremor as that is what bothers her the most.  We will also see if the dizziness gets better.   Lupita Raider Modesto Charon, MD [facility],  [place]

## 2012-06-29 NOTE — Patient Instructions (Signed)
Decrease Keppra as instructed.  then start clonazepam 0.5mg  tabs  take 1/2 tab at night for 5 days, then 1/2 tab twice a day for 5 days, then 1/2 tab in the a.m., 1 at night for 5 days, then 1 tab twice a day for 5 days, then 1 in the a.m., 2 at night for 5 days then 2 twice a day from then on.

## 2012-08-16 ENCOUNTER — Ambulatory Visit (INDEPENDENT_AMBULATORY_CARE_PROVIDER_SITE_OTHER): Payer: BC Managed Care – PPO | Admitting: Neurology

## 2012-08-16 ENCOUNTER — Encounter: Payer: Self-pay | Admitting: Neurology

## 2012-08-16 VITALS — BP 108/74 | HR 80 | Wt 202.0 lb

## 2012-08-16 DIAGNOSIS — G8190 Hemiplegia, unspecified affecting unspecified side: Secondary | ICD-10-CM

## 2012-08-16 DIAGNOSIS — G8191 Hemiplegia, unspecified affecting right dominant side: Secondary | ICD-10-CM

## 2012-08-16 MED ORDER — CLONAZEPAM 1 MG PO TABS: 1.0000 mg | ORAL_TABLET | Freq: Two times a day (BID) | ORAL | Status: DC

## 2012-08-16 MED ORDER — DEXAMETHASONE 2 MG PO TABS: ORAL_TABLET | ORAL | Status: AC

## 2012-08-17 NOTE — Progress Notes (Signed)
Dear Dr. Fabian Sharp,   I saw Tina Mckee back in [place] [facility] clinic for her problem with congenital right hemiparesis and possible EPC and left sided tremor/myoclonus . As you may recall, she is a 21 y.o. year old female with a history of congenital left MCA ischemic stroke who has right hemiparesis who has a long history of arrhythmic movements of her right foot, who over the last several months has developed shaking of her left arm and hand. I felt that the movements in her right foot were likely EPC, and felt that the left hand movements may be epileptic in origin as well. An MRI brain revealed old left MCA encephalomalacia, while an EEG revealed intermittent theta and delta range slowing in the left central parietal area, that I felt might correlate with the right sided leg movements. Left sided movements did not have an EEG correlate.   I initially started her on Keppra and increased it to 750mg  bid. She felt that the movements on the right side were better, but the movements on the left side which were the most debilitating(as this is her good side) were not much better. Unfortunately she felt that the Keppra was  making her sleepy as well as causing spells of diaphoresis and dizziness that could last minutes. These spells could occur about once per week. She did not notice them before starting the Keppra.   At her last visit I instructed her to withdrawal the Keppra and start clonazepam and increase to 1mg  bid.  Her right sided movements which I am still unsure as to their etiology attenuated.  Her right foot movements which may be EPC also attenuated.  In addition she has not had the sleepiness and spells of dizziness after coming off the Keppra.  She also complains today of mainly left sided headaches, that are occuring every day, with photophobia.  She is taking Excedrin every day for them and has been doing so for months.  Medical history, social history, and family history were reviewed  and have not changed since the last clinic visit.  Current Outpatient Prescriptions on File Prior to Visit  Medication Sig Dispense Refill  . acetaminophen (TYLENOL) 500 MG tablet Take 1,000 mg by mouth every 6 (six) hours as needed. Pain       . clonazePAM (KLONOPIN) 1 MG tablet Take 1 tablet (1 mg total) by mouth 2 (two) times daily.  60 tablet  5  . Multiple Vitamins-Minerals (WOMENS MULTIVITAMIN PLUS PO) Take 1 tablet by mouth daily.        . norethindrone (CAMILA) 0.35 MG tablet Take 1 tablet (0.35 mg total) by mouth daily.  1 Package  12    Allergies  Allergen Reactions  . Cephalexin     REACTION: rash  with mono    ROS:  13 systems were reviewed and are notable for excessive fatigue(although she does not think this is worse on clonazepam).  All other review of systems are unremarkable.  Exam: . Filed Vitals:   08/16/12 1535  BP: 108/74  Pulse: 80  Weight: 202 lb (91.627 kg)    In general, obese young women.  Motor:  Right sided spastic hemiparesis, no obvious movements of left hand today, minor movements of right foot.   Impression/Recommendations:  1.  ?continuous movements of right foot/ and new tremor/myoclonic movements of left hand/arm - I think these may have two different etiologies; The clonazepam has certainly helped.  I am wary that these may return if  the affect of clonazepam attenuates. 2.  Migraine headaches - these are likely medication overuse headaches.  Stop Excedrin and will use decadron taper.  I may end up starting her on a preventative later. ?Topamax.  I am concerned about her use of an OCP with poorly controlled migraines - but given it is a progesterone  only medication it is likely safer than estrogen based meds.  We will see the patient back in 3 months.  She will follow with me at [facility].  Lupita Raider Modesto Charon, MD [facility], [place]

## 2012-09-20 ENCOUNTER — Other Ambulatory Visit (INDEPENDENT_AMBULATORY_CARE_PROVIDER_SITE_OTHER): Payer: BC Managed Care – PPO

## 2012-09-20 DIAGNOSIS — Z0000 Encounter for general adult medical examination without abnormal findings: Secondary | ICD-10-CM

## 2012-09-20 LAB — CBC WITH DIFFERENTIAL/PLATELET
Basophils Absolute: 0 10*3/uL (ref 0.0–0.1)
Eosinophils Absolute: 0.3 10*3/uL (ref 0.0–0.7)
Lymphocytes Relative: 35.1 % (ref 12.0–46.0)
MCHC: 33.7 g/dL (ref 30.0–36.0)
MCV: 89.7 fl (ref 78.0–100.0)
Monocytes Absolute: 0.7 10*3/uL (ref 0.1–1.0)
Neutrophils Relative %: 53.4 % (ref 43.0–77.0)
Platelets: 512 10*3/uL — ABNORMAL HIGH (ref 150.0–400.0)
RDW: 13.4 % (ref 11.5–14.6)

## 2012-09-20 LAB — BASIC METABOLIC PANEL
BUN: 11 mg/dL (ref 6–23)
CO2: 24 mEq/L (ref 19–32)
Calcium: 8.8 mg/dL (ref 8.4–10.5)
Creatinine, Ser: 0.6 mg/dL (ref 0.4–1.2)
Glucose, Bld: 103 mg/dL — ABNORMAL HIGH (ref 70–99)

## 2012-09-20 LAB — LDL CHOLESTEROL, DIRECT: Direct LDL: 101.2 mg/dL

## 2012-09-20 LAB — POCT URINALYSIS DIPSTICK
Bilirubin, UA: NEGATIVE
Ketones, UA: NEGATIVE
Protein, UA: NEGATIVE
Spec Grav, UA: 1.025
pH, UA: 5.5

## 2012-09-20 LAB — LIPID PANEL
HDL: 29.5 mg/dL — ABNORMAL LOW (ref >39.00)
Triglycerides: 227 mg/dL — ABNORMAL HIGH (ref 0.0–149.0)

## 2012-09-20 LAB — HEPATIC FUNCTION PANEL
ALT: 37 U/L — ABNORMAL HIGH (ref 0–35)
AST: 27 U/L (ref 0–37)
Alkaline Phosphatase: 63 U/L (ref 39–117)
Total Bilirubin: 0.9 mg/dL (ref 0.3–1.2)

## 2012-09-20 LAB — MICROALBUMIN / CREATININE URINE RATIO: Microalb Creat Ratio: 0.5 mg/g (ref 0.0–30.0)

## 2012-09-20 LAB — HEMOGLOBIN A1C: Hgb A1c MFr Bld: 5.5 % (ref 4.6–6.5)

## 2012-09-20 LAB — TSH: TSH: 3.42 u[IU]/mL (ref 0.35–5.50)

## 2012-09-23 ENCOUNTER — Other Ambulatory Visit: Payer: Self-pay | Admitting: Internal Medicine

## 2012-09-27 ENCOUNTER — Ambulatory Visit (INDEPENDENT_AMBULATORY_CARE_PROVIDER_SITE_OTHER): Payer: BC Managed Care – PPO | Admitting: Internal Medicine

## 2012-09-27 ENCOUNTER — Other Ambulatory Visit (HOSPITAL_COMMUNITY)
Admission: RE | Admit: 2012-09-27 | Discharge: 2012-09-27 | Disposition: A | Discharge status: Home / Self Care | Payer: BC Managed Care – PPO | Source: Ambulatory Visit | Attending: Internal Medicine | Admitting: Internal Medicine

## 2012-09-27 ENCOUNTER — Encounter: Payer: Self-pay | Admitting: Internal Medicine

## 2012-09-27 VITALS — BP 116/80 | HR 97 | Temp 98.4°F | Ht 62.75 in | Wt 208.0 lb

## 2012-09-27 DIAGNOSIS — R7989 Other specified abnormal findings of blood chemistry: Secondary | ICD-10-CM | POA: Insufficient documentation

## 2012-09-27 DIAGNOSIS — R945 Abnormal results of liver function studies: Secondary | ICD-10-CM

## 2012-09-27 DIAGNOSIS — M4120 Other idiopathic scoliosis, site unspecified: Secondary | ICD-10-CM

## 2012-09-27 DIAGNOSIS — Z23 Encounter for immunization: Secondary | ICD-10-CM

## 2012-09-27 DIAGNOSIS — D75839 Thrombocytosis, unspecified: Secondary | ICD-10-CM

## 2012-09-27 DIAGNOSIS — G8190 Hemiplegia, unspecified affecting unspecified side: Secondary | ICD-10-CM

## 2012-09-27 DIAGNOSIS — Z01419 Encounter for gynecological examination (general) (routine) without abnormal findings: Secondary | ICD-10-CM

## 2012-09-27 DIAGNOSIS — R635 Abnormal weight gain: Secondary | ICD-10-CM

## 2012-09-27 DIAGNOSIS — N760 Acute vaginitis: Secondary | ICD-10-CM | POA: Insufficient documentation

## 2012-09-27 DIAGNOSIS — R7309 Other abnormal glucose: Secondary | ICD-10-CM

## 2012-09-27 DIAGNOSIS — R739 Hyperglycemia, unspecified: Secondary | ICD-10-CM

## 2012-09-27 DIAGNOSIS — E781 Pure hyperglyceridemia: Secondary | ICD-10-CM

## 2012-09-27 DIAGNOSIS — D473 Essential (hemorrhagic) thrombocythemia: Secondary | ICD-10-CM

## 2012-09-27 DIAGNOSIS — Z0000 Encounter for general adult medical examination without abnormal findings: Secondary | ICD-10-CM

## 2012-09-27 DIAGNOSIS — G8191 Hemiplegia, unspecified affecting right dominant side: Secondary | ICD-10-CM

## 2012-09-27 NOTE — Patient Instructions (Addendum)
Weight loss and healthy eating and activity  will help your cholesterol level and avoid diabetes and fatty liver. And also heart burn. Plan labs in 4 months and ROV. We can refer for nutritional  advice at any time.  Stop all simple sugars  And sweets. This can make you hungrier . Get sleep .  Consider weight watchers ,water exercise .   Hypertriglyceridemia  Diet for High blood levels of Triglycerides Most fats in food are triglycerides. Triglycerides in your blood are stored as fat in your body. High levels of triglycerides in your blood may put you at a greater risk for heart disease and stroke.  Normal triglyceride levels are less than 150 mg/dL. Borderline high levels are 150-199 mg/dl. High levels are 200 - 499 mg/dL, and very high triglyceride levels are greater than 500 mg/dL. The decision to treat high triglycerides is generally based on the level. For people with borderline or high triglyceride levels, treatment includes weight loss and exercise. Drugs are recommended for people with very high triglyceride levels. Many people who need treatment for high triglyceride levels have metabolic syndrome. This syndrome is a collection of disorders that often include: insulin resistance, high blood pressure, blood clotting problems, high cholesterol and triglycerides. TESTING PROCEDURE FOR TRIGLYCERIDES  You should not eat 4 hours before getting your triglycerides measured. The normal range of triglycerides is between 10 and 250 milligrams per deciliter (mg/dl). Some people may have extreme levels (1000 or above), but your triglyceride level may be too high if it is above 150 mg/dl, depending on what other risk factors you have for heart disease.  People with high blood triglycerides may also have high blood cholesterol levels. If you have high blood cholesterol as well as high blood triglycerides, your risk for heart disease is probably greater than if you only had high triglycerides. High blood  cholesterol is one of the main risk factors for heart disease. CHANGING YOUR DIET  Your weight can affect your blood triglyceride level. If you are more than 20% above your ideal body weight, you may be able to lower your blood triglycerides by losing weight. Eating less and exercising regularly is the best way to combat this. Fat provides more calories than any other food. The best way to lose weight is to eat less fat. Only 30% of your total calories should come from fat. Less than 7% of your diet should come from saturated fat. A diet low in fat and saturated fat is the same as a diet to decrease blood cholesterol. By eating a diet lower in fat, you may lose weight, lower your blood cholesterol, and lower your blood triglyceride level.  Eating a diet low in fat, especially saturated fat, may also help you lower your blood triglyceride level. Ask your dietitian to help you figure how much fat you can eat based on the number of calories your caregiver has prescribed for you.  Exercise, in addition to helping with weight loss may also help lower triglyceride levels.   Alcohol can increase blood triglycerides. You may need to stop drinking alcoholic beverages.  Too much carbohydrate in your diet may also increase your blood triglycerides. Some complex carbohydrates are necessary in your diet. These may include bread, rice, potatoes, other starchy vegetables and cereals.  Reduce "simple" carbohydrates. These may include pure sugars, candy, honey, and jelly without losing other nutrients. If you have the kind of high blood triglycerides that is affected by the amount of carbohydrates in your diet,  you will need to eat less sugar and less high-sugar foods. Your caregiver can help you with this.  Adding 2-4 grams of fish oil (EPA+ DHA) may also help lower triglycerides. Speak with your caregiver before adding any supplements to your regimen. Following the Diet  Maintain your ideal weight. Your caregivers  can help you with a diet. Generally, eating less food and getting more exercise will help you lose weight. Joining a weight control group may also help. Ask your caregivers for a good weight control group in your area.  Eat low-fat foods instead of high-fat foods. This can help you lose weight too.  These foods are lower in fat. Eat MORE of these:   Dried beans, peas, and lentils.  Egg whites.  Low-fat cottage cheese.  Fish.  Lean cuts of meat, such as round, sirloin, rump, and flank (cut extra fat off meat you fix).  Whole grain breads, cereals and pasta.  Skim and nonfat dry milk.  Low-fat yogurt.  Poultry without the skin.  Cheese made with skim or part-skim milk, such as mozzarella, parmesan, farmers', ricotta, or pot cheese. These are higher fat foods. Eat LESS of these:   Whole milk and foods made from whole milk, such as American, blue, cheddar, monterey jack, and swiss cheese  High-fat meats, such as luncheon meats, sausages, knockwurst, bratwurst, hot dogs, ribs, corned beef, ground pork, and regular ground beef.  Fried foods. Limit saturated fats in your diet. Substituting unsaturated fat for saturated fat may decrease your blood triglyceride level. You will need to read package labels to know which products contain saturated fats.  These foods are high in saturated fat. Eat LESS of these:   Fried pork skins.  Whole milk.  Skin and fat from poultry.  Palm oil.  Butter.  Shortening.  Cream cheese.  Tomasa Blase.  Margarines and baked goods made from listed oils.  Vegetable shortenings.  Chitterlings.  Fat from meats.  Coconut oil.  Palm kernel oil.  Lard.  Cream.  Sour cream.  Fatback.  Coffee whiteners and non-dairy creamers made with these oils.  Cheese made from whole milk. Use unsaturated fats (both polyunsaturated and monounsaturated) moderately. Remember, even though unsaturated fats are better than saturated fats; you still want a diet  low in total fat.  These foods are high in unsaturated fat:   Canola oil.  Sunflower oil.  Mayonnaise.  Almonds.  Peanuts.  Pine nuts.  Margarines made with these oils.  Safflower oil.  Olive oil.  Avocados.  Cashews.  Peanut butter.  Sunflower seeds.  Soybean oil.  Peanut oil.  Olives.  Pecans.  Walnuts.  Pumpkin seeds. Avoid sugar and other high-sugar foods. This will decrease carbohydrates without decreasing other nutrients. Sugar in your food goes rapidly to your blood. When there is excess sugar in your blood, your liver may use it to make more triglycerides. Sugar also contains calories without other important nutrients.  Eat LESS of these:   Sugar, brown sugar, powdered sugar, jam, jelly, preserves, honey, syrup, molasses, pies, candy, cakes, cookies, frosting, pastries, colas, soft drinks, punches, fruit drinks, and regular gelatin.  Avoid alcohol. Alcohol, even more than sugar, may increase blood triglycerides. In addition, alcohol is high in calories and low in nutrients. Ask for sparkling water, or a diet soft drink instead of an alcoholic beverage. Suggestions for planning and preparing meals   Bake, broil, grill or roast meats instead of frying.  Remove fat from meats and skin from poultry before cooking.  Add spices, herbs, lemon juice or vinegar to vegetables instead of salt, rich sauces or gravies.  Use a non-stick skillet without fat or use no-stick sprays.  Cool and refrigerate stews and broth. Then remove the hardened fat floating on the surface before serving.  Refrigerate meat drippings and skim off fat to make low-fat gravies.  Serve more fish.  Use less butter, margarine and other high-fat spreads on bread or vegetables.  Use skim or reconstituted non-fat dry milk for cooking.  Cook with low-fat cheeses.  Substitute low-fat yogurt or cottage cheese for all or part of the sour cream in recipes for sauces, dips or congealed  salads.  Use half yogurt/half mayonnaise in salad recipes.  Substitute evaporated skim milk for cream. Evaporated skim milk or reconstituted non-fat dry milk can be whipped and substituted for whipped cream in certain recipes.  Choose fresh fruits for dessert instead of high-fat foods such as pies or cakes. Fruits are naturally low in fat. When Dining Out   Order low-fat appetizers such as fruit or vegetable juice, pasta with vegetables or tomato sauce.  Select clear, rather than cream soups.  Ask that dressings and gravies be served on the side. Then use less of them.  Order foods that are baked, broiled, poached, steamed, stir-fried, or roasted.  Ask for margarine instead of butter, and use only a small amount.  Drink sparkling water, unsweetened tea or coffee, or diet soft drinks instead of alcohol or other sweet beverages. QUESTIONS AND ANSWERS ABOUT OTHER FATS IN THE BLOOD: SATURATED FAT, TRANS FAT, AND CHOLESTEROL What is trans fat? Trans fat is a type of fat that is formed when vegetable oil is hardened through a process called hydrogenation. This process helps makes foods more solid, gives them shape, and prolongs their shelf life. Trans fats are also called hydrogenated or partially hydrogenated oils.  What do saturated fat, trans fat, and cholesterol in foods have to do with heart disease? Saturated fat, trans fat, and cholesterol in the diet all raise the level of LDL "bad" cholesterol in the blood. The higher the LDL cholesterol, the greater the risk for coronary heart disease (CHD). Saturated fat and trans fat raise LDL similarly.  What foods contain saturated fat, trans fat, and cholesterol? High amounts of saturated fat are found in animal products, such as fatty cuts of meat, chicken skin, and full-fat dairy products like butter, whole milk, cream, and cheese, and in tropical vegetable oils such as palm, palm kernel, and coconut oil. Trans fat is found in some of the same  foods as saturated fat, such as vegetable shortening, some margarines (especially hard or stick margarine), crackers, cookies, baked goods, fried foods, salad dressings, and other processed foods made with partially hydrogenated vegetable oils. Small amounts of trans fat also occur naturally in some animal products, such as milk products, beef, and lamb. Foods high in cholesterol include liver, other organ meats, egg yolks, shrimp, and full-fat dairy products. How can I use the new food label to make heart-healthy food choices? Check the Nutrition Facts panel of the food label. Choose foods lower in saturated fat, trans fat, and cholesterol. For saturated fat and cholesterol, you can also use the Percent Daily Value (%DV): 5% DV or less is low, and 20% DV or more is high. (There is no %DV for trans fat.) Use the Nutrition Facts panel to choose foods low in saturated fat and cholesterol, and if the trans fat is not listed, read the ingredients and  limit products that list shortening or hydrogenated or partially hydrogenated vegetable oil, which tend to be high in trans fat. POINTS TO REMEMBER:   Discuss your risk for heart disease with your caregivers, and take steps to reduce risk factors.  Change your diet. Choose foods that are low in saturated fat, trans fat, and cholesterol.  Add exercise to your daily routine if it is not already being done. Participate in physical activity of moderate intensity, like brisk walking, for at least 30 minutes on most, and preferably all days of the week. No time? Break the 30 minutes into three, 10-minute segments during the day.  Stop smoking. If you do smoke, contact your caregiver to discuss ways in which they can help you quit.  Do not use street drugs.  Maintain a normal weight.  Maintain a healthy blood pressure.  Keep up with your blood work for checking the fats in your blood as directed by your caregiver. Document Released: 09/17/2004 Document Revised:  05/31/2012 Document Reviewed: 04/15/2009 [facility] Patient Information 2013 [place], [place].

## 2012-09-27 NOTE — Progress Notes (Signed)
Subjective:    Patient ID: Tina Mckee, female    DOB: 20-Jun-1991, 21 y.o.   MRN: [id number]  HPI Patient comes in today for preventive visit and follow-up of medical issues. Update  history since  last visit: Needs PAP:  On minipill  Skipping periods    3-4 days.  When gets them .  Has gained a good bit of weight No hops of injury  Working part time grocery store. Under care Neuro Dr Modesto Charon for her Right hemiparesis and tremor  . Trying different meds  Will fu with him at [facility].   Review of Systems ROS:  GEN/ HEENT: No fever, significant weight changes sweats headaches vision problems hearing changes, CV/ PULM; No chest pain shortness of breath has had a nocturnal cough recently no sob wheeze , syncope,edema  change in exercise tolerance. GI /GU: No adominal pain, vomiting, change in bowel habits. No blood in the stool.  Has some heart burn at times SKIN/HEME: ,no acute skin rashes suspicious lesions or bleeding. No lymphadenopathy, nodules, masses.  NEURO/ PSYCH:  As above  No depression anxiety. IMM/ Allergy: No unusual infections.  Allergy .  Ragweed seasonal REST of 12 system review negative except as per HPI  Outpatient Encounter Prescriptions as of 09/27/2012  Medication Sig Dispense Refill  . clonazePAM (KLONOPIN) 1 MG tablet Take 1 tablet (1 mg total) by mouth 2 (two) times daily.  60 tablet  5  . Multiple Vitamins-Minerals (WOMENS MULTIVITAMIN PLUS PO) Take 1 tablet by mouth daily.        . norethindrone (MICRONOR,CAMILA,ERRIN) 0.35 MG tablet TAKE 1 TABLET BY MOUTH DAILY.  28 tablet  0  . DISCONTD: acetaminophen (TYLENOL) 500 MG tablet Take 1,000 mg by mouth every 6 (six) hours as needed. Pain            Objective:   Physical Exam BP 116/80  Pulse 97  Temp 98.4 F (36.9 C) (Oral)  Ht 5' 2.75" (1.594 m)  Wt 208 lb (94.348 kg)  BMI 37.14 kg/m2  SpO2 98%  LMP 09/20/2012 Wt Readings from Last 3 Encounters:  09/27/12 208 lb (94.348 kg)  08/16/12 202 lb (91.627  kg)  06/29/12 195 lb (88.451 kg)    Physical Exam: Vital signs reviewed [id number] is a well-nourished alert cooperative  white female who appears her stated age in no acute distress.  HEENT: normocephalic atraumatic , Eyes: PERRL EOM's full, conjunctiva clear, Nares: paten,t no deformity discharge or tenderness., Ears: no deformity EAC's clear TMs with normal landmarks. Mouth: clear OP, no lesions, edema.  Moist mucous membranes. Dentition in adequate repair. NECK: supple without masses, thyromegaly or bruits. CHEST/PULM:  Clear to auscultation and percussion breath sounds equal no wheeze , rales or rhonchi. No chest wall deformities or tenderness. Breast: normal by inspection . No dimpling, discharge, masses, tenderness or discharge . CV: PMI is nondisplaced, S1 S2 no gallops, murmurs, rubs. Peripheral pulses are full without delay.No JVD .  ABDOMEN: Bowel sounds normal nontender  No guard or rebound, no hepato splenomegal no CVA tenderness.  No hernia. Extremtities:  No clubbing cyanosis or edema, no acute joint swelling or redness no focal atrophy NEURO:  Oriented x3, cranial nerves 3-12 appear to be intact, right hemiparesis not obv  At rest   Left hand not shaking today l SKIN: No acute rashes normal turgor, color, no bruising or petechiae. PSYCH: Oriented, good eye contact, no obvious depression anxiety, cognition and judgment appear normal. LN: no cervical axillary inguinal adenopathy  Pelvic: NL ext GU, labia clear without lesions or rash . Vagina no lesions .Cervix: clear  Inferior not fully visualized   UTERUS: Neg CMT Adnexa:  clear no masses . PAP done white dc    Lab Results  Component Value Date   WBC 9.2 09/20/2012   HGB 14.7 09/20/2012   HCT 43.6 09/20/2012   PLT 512.0* 09/20/2012   GLUCOSE 103* 09/20/2012   CHOL 159 09/20/2012   TRIG 227.0* 09/20/2012   HDL 29.50* 09/20/2012   LDLDIRECT 101.2 09/20/2012   LDLCALC 82 08/03/2011   ALT 37* 09/20/2012   AST 27 09/20/2012   NA 138  09/20/2012   K 3.6 09/20/2012   CL 106 09/20/2012   CREATININE 0.6 09/20/2012   BUN 11 09/20/2012   CO2 24 09/20/2012   TSH 3.42 09/20/2012   HGBA1C 5.5 09/20/2012   MICROALBUR 0.9 09/20/2012        Assessment & Plan:  Preventive Health Care Counseled regarding healthy nutrition, exercise, sleep, injury prevention, calcium vit d and healthy weight . Weight gain noted   prob causing metabolic changes  Hyper glycemia triglycerides Uncertain cause of the elevated pt count will follow . No obv iron defic.  Minor alt change related to above probably.  Recent cough prob allergy or resolving uri  Nl exam fu if  persistent or progressive consider elr as cause Neuro hemiparesis and tremor shaking  Under care with meds .

## 2012-10-01 DIAGNOSIS — D75839 Thrombocytosis, unspecified: Secondary | ICD-10-CM | POA: Insufficient documentation

## 2012-10-01 DIAGNOSIS — D473 Essential (hemorrhagic) thrombocythemia: Secondary | ICD-10-CM | POA: Insufficient documentation

## 2012-10-04 ENCOUNTER — Encounter: Payer: Self-pay | Admitting: Family Medicine

## 2012-10-04 NOTE — Progress Notes (Signed)
Quick Note:  Tell patient PAP is normal. ______ 

## 2012-10-09 ENCOUNTER — Telehealth: Payer: Self-pay | Admitting: Internal Medicine

## 2012-10-09 ENCOUNTER — Encounter: Payer: Self-pay | Admitting: Internal Medicine

## 2012-10-10 ENCOUNTER — Encounter: Payer: Self-pay | Admitting: Internal Medicine

## 2012-10-10 ENCOUNTER — Other Ambulatory Visit: Payer: Self-pay | Admitting: Internal Medicine

## 2012-10-10 MED ORDER — NORETHINDRONE 0.35 MG PO TABS: 1.0000 | ORAL_TABLET | Freq: Every day | ORAL | Status: DC

## 2012-10-10 NOTE — Telephone Encounter (Signed)
Sent to the pharmacy by e-scribe. 

## 2013-01-18 ENCOUNTER — Other Ambulatory Visit (INDEPENDENT_AMBULATORY_CARE_PROVIDER_SITE_OTHER): Payer: BC Managed Care – PPO

## 2013-01-18 DIAGNOSIS — D649 Anemia, unspecified: Secondary | ICD-10-CM

## 2013-01-18 DIAGNOSIS — E119 Type 2 diabetes mellitus without complications: Secondary | ICD-10-CM

## 2013-01-18 DIAGNOSIS — E785 Hyperlipidemia, unspecified: Secondary | ICD-10-CM

## 2013-01-18 LAB — CBC WITH DIFFERENTIAL/PLATELET
Basophils Absolute: 0 10*3/uL (ref 0.0–0.1)
Eosinophils Absolute: 0.3 10*3/uL (ref 0.0–0.7)
HCT: 44.2 % (ref 36.0–46.0)
Hemoglobin: 15.1 g/dL — ABNORMAL HIGH (ref 12.0–15.0)
Lymphs Abs: 3.4 10*3/uL (ref 0.7–4.0)
MCHC: 34.2 g/dL (ref 30.0–36.0)
Neutro Abs: 5.3 10*3/uL (ref 1.4–7.7)
RDW: 12.4 % (ref 11.5–14.6)

## 2013-01-18 LAB — HEPATIC FUNCTION PANEL: Albumin: 4.2 g/dL (ref 3.5–5.2)

## 2013-01-18 LAB — HEMOGLOBIN A1C: Hgb A1c MFr Bld: 5.3 % (ref 4.6–6.5)

## 2013-01-18 LAB — LIPID PANEL: Cholesterol: 166 mg/dL (ref 0–200)

## 2013-01-25 ENCOUNTER — Ambulatory Visit: Payer: BC Managed Care – PPO | Admitting: Internal Medicine

## 2013-02-14 ENCOUNTER — Other Ambulatory Visit: Payer: Self-pay | Admitting: Neurology

## 2013-02-20 ENCOUNTER — Other Ambulatory Visit: Payer: Self-pay | Admitting: Internal Medicine

## 2013-02-21 NOTE — Telephone Encounter (Signed)
Had CPE on 09/27/12.  Last filled on 08/16/12 #60 with 5 additional refills.  No follow up scheduled.  Please advise.  Thanks!!

## 2013-09-15 ENCOUNTER — Encounter: Payer: Self-pay | Admitting: Internal Medicine

## 2013-09-22 ENCOUNTER — Other Ambulatory Visit: Payer: BC Managed Care – PPO

## 2013-09-29 ENCOUNTER — Other Ambulatory Visit (HOSPITAL_COMMUNITY)
Admission: RE | Admit: 2013-09-29 | Discharge: 2013-09-29 | Disposition: A | Discharge status: Home / Self Care | Payer: BC Managed Care – PPO | Source: Ambulatory Visit | Attending: Internal Medicine | Admitting: Internal Medicine

## 2013-09-29 ENCOUNTER — Encounter: Payer: Self-pay | Admitting: Internal Medicine

## 2013-09-29 ENCOUNTER — Ambulatory Visit (INDEPENDENT_AMBULATORY_CARE_PROVIDER_SITE_OTHER): Payer: BC Managed Care – PPO | Admitting: Internal Medicine

## 2013-09-29 VITALS — BP 102/80 | HR 94 | Temp 97.4°F | Ht 62.5 in | Wt 207.0 lb

## 2013-09-29 DIAGNOSIS — Z23 Encounter for immunization: Secondary | ICD-10-CM

## 2013-09-29 DIAGNOSIS — E669 Obesity, unspecified: Secondary | ICD-10-CM | POA: Insufficient documentation

## 2013-09-29 DIAGNOSIS — Z01419 Encounter for gynecological examination (general) (routine) without abnormal findings: Secondary | ICD-10-CM | POA: Insufficient documentation

## 2013-09-29 DIAGNOSIS — G8191 Hemiplegia, unspecified affecting right dominant side: Secondary | ICD-10-CM

## 2013-09-29 DIAGNOSIS — R7309 Other abnormal glucose: Secondary | ICD-10-CM

## 2013-09-29 DIAGNOSIS — Z0000 Encounter for general adult medical examination without abnormal findings: Secondary | ICD-10-CM

## 2013-09-29 DIAGNOSIS — G8190 Hemiplegia, unspecified affecting unspecified side: Secondary | ICD-10-CM

## 2013-09-29 DIAGNOSIS — E781 Pure hyperglyceridemia: Secondary | ICD-10-CM

## 2013-09-29 DIAGNOSIS — R739 Hyperglycemia, unspecified: Secondary | ICD-10-CM

## 2013-09-29 MED ORDER — NORETHINDRONE 0.35 MG PO TABS: 1.0000 | ORAL_TABLET | Freq: Every day | ORAL | Status: DC

## 2013-09-29 NOTE — Patient Instructions (Signed)
150 minutes of exercise weeks  ,  Lose weight  To healthy levels.bmi Avoid trans fats and processed foods;  Increase fresh fruits and veges to 5 servings per day. And avoid sweet beverages  Including tea and juice.  Keep in touch with your neurologist if needed.

## 2013-09-29 NOTE — Progress Notes (Signed)
Chief Complaint  Patient presents with  . Annual Exam  . Gynecologic Exam    HPI: Patient comes in today for Preventive Health Care visit  Here with daughter  Would like also to get pap cause of family hx of concerns .  Period ok on  Mini pill most of the time .  No more seizures or shakes  Wonders if from stress .   Not on meds now.   Has labs from husband work program and nl lipids but tg 161  fbs 105 nl a1c at  5.3  No change in her paresis  working  ROS:  GEN/ HEENT: No fever, significant weight changes sweats headaches vision problems hearing changes, CV/ PULM; No chest pain shortness of breath cough, syncope,edema  change in exercise tolerance. GI /GU: No adominal pain, vomiting, change in bowel habits. No blood in the stool. No significant GU symptoms. SKIN/HEME: ,no acute skin rashes suspicious lesions or bleeding. No lymphadenopathy, nodules, masses.  NEURO/ PSYCH:  No neurologic signs such as weakness numbness. No depression anxiety. IMM/ Allergy: No unusual infections.  Allergy .   REST of 12 system review negative except as per HPI   Past Medical History  Diagnosis Date  . Stroke birth    rt hemiparesis  Perinatal  . Scoliosis   . Perinatal arterial ischemic stroke     Family History  Problem Relation Age of Onset  . Hypertension Mother   . Asthma Mother   . Hypertension Father   . Asthma Sister     History   Social History  . Marital Status: Single    Spouse Name: N/A    Number of Children: N/A  . Years of Education: N/A   Social History Main Topics  . Smoking status: Former Smoker    Quit date: 07/11/2007  . Smokeless tobacco: Never Used  . Alcohol Use: 0.5 oz/week    1 drink(s) per week     Comment: weekends every 3 weeks  . Drug Use: No  . Sexual Activity: Yes    Birth Control/ Protection: Pill   Other Topics Concern  . 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  5 days per week 2 days off .     Currently nonsmoking    HH of 3 no pets     had mild preeclampsia with vaginal childbirth.  2011   ocass etoh.    Exercise  Walking     Sleep ok    caffiene limited per day .                 Outpatient Encounter Prescriptions as of 09/29/2013  Medication Sig Dispense Refill  . Multiple Vitamins-Minerals (WOMENS MULTIVITAMIN PLUS PO) Take 1 tablet by mouth daily.        . norethindrone (MICRONOR,CAMILA,ERRIN) 0.35 MG tablet Take 1 tablet (0.35 mg total) by mouth daily.  28 tablet  11  . [DISCONTINUED] norethindrone (MICRONOR,CAMILA,ERRIN) 0.35 MG tablet Take 1 tablet (0.35 mg total) by mouth daily.  28 tablet  11  . [DISCONTINUED] clonazePAM (KLONOPIN) 1 MG tablet Take 1 tablet (1 mg total) by mouth 2 (two) times daily.  60 tablet  5   No facility-administered encounter medications on file as of 09/29/2013.    EXAM:  BP 102/80  Pulse 94  Temp(Src) 97.4 F (36.3 C) (Oral)  Ht 5'  2.5" (1.588 m)  Wt 207 lb (93.895 kg)  BMI 37.23 kg/m2  SpO2 98%  LMP 09/02/2013  Body mass index is 37.23 kg/(m^2).  Physical Exam: Vital signs reviewed [id number] is a well-developed well-nourished alert cooperative   female who appears her stated age in no acute distress.  HEENT: normocephalic atraumatic , Eyes: PERRL EOM's full, conjunctiva clear, Nares: paten,t no deformity discharge or tenderness., Ears: no deformity EAC's clear TMs with normal landmarks. Mouth: clear OP, no lesions, edema.  Moist mucous membranes. Dentition in adequate repair. NECK: supple without masses, thyromegaly or bruits. CHEST/PULM:  Clear to auscultation and percussion breath sounds equal no wheeze , rales or rhonchi. No chest wall deformities or tenderness. Breast: normal by inspection . No dimpling, discharge, masses, tenderness or discharge .  CV: PMI is nondisplaced, S1 S2 no gallops, murmurs, rubs. Peripheral pulses are full without delay.No JVD .  ABDOMEN: Bowel sounds  normal nontender  No guard or rebound, no hepato splenomegal no CVA tenderness.  No hernia. Extremtities:  No clubbing cyanosis or edema, no acute joint swelling or redness right arm dec muscle mass mild weakness NEURO:  Oriented x3, cranial nerves 3-12 appear to be intact, mild paresisi right mostly upper extremity ,gait within normal limitsSKIN: No acute rashes normal turgor, color, no bruising or petechiae. PSYCH: Oriented, good eye contact, no obvious depression anxiety, cognition and judgment appear normal. LN: no cervical axillary inguinal adenopathy Pelvic: NL ext GU, labia clear without lesions or rash . Vagina no lesions .Cervix: clear  UTERUS: Neg CMT Adnexa:  clear no masses . PAP done   Lab Results  Component Value Date   WBC 9.7 01/18/2013   HGB 15.1* 01/18/2013   HCT 44.2 01/18/2013   PLT 415.0* 01/18/2013   GLUCOSE 103* 09/20/2012   CHOL 166 01/18/2013   TRIG 272.0* 01/18/2013   HDL 26.80* 01/18/2013   LDLDIRECT 104.9 01/18/2013   LDLCALC 82 08/03/2011   ALT 20 01/18/2013   AST 17 01/18/2013   NA 138 09/20/2012   K 3.6 09/20/2012   CL 106 09/20/2012   CREATININE 0.6 09/20/2012   BUN 11 09/20/2012   CO2 24 09/20/2012   TSH 3.42 09/20/2012   HGBA1C 5.3 01/18/2013   MICROALBUR 0.9 09/20/2012    ASSESSMENT AND PLAN:  Discussed the following assessment and plan:  Visit for preventive health examination  Routine gynecological examination  Hemiparesis, right  High blood triglycerides  Need for prophylactic vaccination and inoculation against influenza - Plan: Flu Vaccine QUAD 36+ mos PF IM (Fluarix), Cytology - PAP  Hyperglycemia - mild not diabeteic Disc other optinos  iud etc  Will think about this  Patient Care Team: Madelin Headings, MD as PCP - General Deetta Perla, MD (Neurology) Tilda Burrow, MD (Obstetrics and Gynecology) Eldred Manges, MD (Orthopedic Surgery) wong (Neurology) Patient Instructions  150 minutes of exercise weeks  ,  Lose weight  To healthy levels.bmi  Avoid trans fats and processed foods;  Increase fresh fruits and veges to 5 servings per day. And avoid sweet beverages  Including tea and juice.  Keep in touch with your neurologist if needed.       anda K. Panosh M.D. Health Maintenance  Topic Date Due  . Influenza Vaccine  07/14/2013  . Pap Smear  09/28/2015  . Tetanus/tdap  08/02/2021   Health Maintenance Review

## 2013-10-03 NOTE — Progress Notes (Signed)
Quick Note:  Tell patient PAP is normal. ______ 

## 2013-10-04 ENCOUNTER — Encounter: Payer: Self-pay | Admitting: Family Medicine

## 2013-10-20 ENCOUNTER — Telehealth: Payer: Self-pay | Admitting: Internal Medicine

## 2013-10-20 ENCOUNTER — Ambulatory Visit (INDEPENDENT_AMBULATORY_CARE_PROVIDER_SITE_OTHER): Payer: BC Managed Care – PPO | Admitting: Family Medicine

## 2013-10-20 ENCOUNTER — Encounter: Payer: Self-pay | Admitting: Family Medicine

## 2013-10-20 VITALS — BP 110/80 | HR 90 | Temp 98.1°F | Wt 207.0 lb

## 2013-10-20 DIAGNOSIS — J988 Other specified respiratory disorders: Secondary | ICD-10-CM

## 2013-10-20 MED ORDER — ALBUTEROL SULFATE HFA 108 (90 BASE) MCG/ACT IN AERS: 2.0000 | INHALATION_SPRAY | RESPIRATORY_TRACT | Status: DC | PRN

## 2013-10-20 MED ORDER — PREDNISONE 10 MG PO TABS: ORAL_TABLET | ORAL | Status: DC

## 2013-10-20 NOTE — Telephone Encounter (Signed)
Patient Information:  Caller Name: Rogene  Phone: [phone]  Patient: Tina Mckee  Gender: Female  DOB: 1991/01/15  Age: 22 Years  PCP: Berniece Andreas [facility])  Pregnant: No  Office Follow Up:  Does the office need to follow up with this patient?: Yes  Instructions For The Office: Go to office Now per guideline.  Next available is 13:45 with Dr. Caryl Never at time of call.  Patient declined to schedule at alternate office.  Please follow up and contact patient.  I scheduled her for 13:45 pending your checking with provider so place will not be lost; cancel appointment if you want her to go to [facility] or ED. Thank you.   Symptoms  Reason For Call & Symptoms: Patient calling.  Reports stuffy nose, followed by loss of voice, felt like elephant on chest 10/19/13.  She sounds hoarse.  Body aches, back pain in shoulders.  Chest pain rated at 6 of 10 with deep breath.  Wheezing reported.    Congested cough heard during the conversation.   Go to Office Now per Cough protocol due to Wheezing is present.  Reviewed Health History In EMR: Yes  Reviewed Medications In EMR: Yes  Reviewed Allergies In EMR: Yes  Reviewed Surgeries / Procedures: Yes  Date of Onset of Symptoms: 10/19/2013  Treatments Tried: Benadryl - no relief  Treatments Tried Worked: No OB / GYN:  LMP: 10/01/2013  Guideline(s) Used:  Breathing Difficulty  Cough  Disposition Per Guideline:   Go to Office Now  Reason For Disposition Reached:   Wheezing is present  Advice Given:  Reassurance  Coughing is the way that our lungs remove irritants and mucus. It helps protect our lungs from getting pneumonia.  You can get a dry hacking cough after a chest cold. Sometimes this type of cough can last 1-3 weeks, and be worse at night.  You can also get a cough after being exposed to irritating substances like smoke, strong perfumes, and dust.  Here is some care advice that should help.  Cough Medicines:  OTC Cough Drops:  Cough drops can help a lot, especially for mild coughs. They reduce coughing by soothing your irritated throat and removing that tickle sensation in the back of the throat. Cough drops also have the advantage of portability - you can carry them with you.  Home Remedy - Hard Candy: Hard candy works just as well as medicine-flavored OTC cough drops. Diabetics should use sugar-free candy.  Home Remedy - Honey: This old home remedy has been shown to help decrease coughing at night. The adult dosage is 2 teaspoons (10 ml) at bedtime. Honey should not be given to infants under one year of age.  Coughing Spasms:  Drink warm fluids. Inhale warm mist (Reason: both relax the airway and loosen up the phlegm).  Suck on cough drops or hard candy to coat the irritated throat.  Prevent Dehydration:  Drink adequate liquids.  This will help soothe an irritated or dry throat and loosen up the phlegm.  Fever Medicines:  For fevers above 101 F (38.3 C) take either acetaminophen or ibuprofen.  They are over-the-counter (OTC) drugs that help treat both fever and pain. You can buy them at the drugstore.l  The goal of fever therapy is to bring the fever down to a comfortable level. Remember that fever medicine usually lowers fever 2 degrees F (1 - 1 1/2 degrees C).  Ibuprofen (e.g., Motrin, Advil):  Take 400 mg (two 200 mg pills) by  mouth every 6 hours.  Another choice is to take 600 mg (three 200 mg pills) by mouth every 8 hours.  The most you should take each day is 1,200 mg (six 200 mg pills), unless your doctor has told you to take more.  Expected Course:   The expected course depends on what is causing the cough.  Viral bronchitis (chest cold) causes a cough that lasts 1 to 3 weeks. Sometimes you may cough up lots of phlegm (sputum, mucus). The mucus can normally be white, gray, yellow, or green.  Call Back If:  You become worse.  RN Overrode Recommendation:  Make Appointment  Go to Office Now  per  guideline.

## 2013-10-20 NOTE — Telephone Encounter (Signed)
On BB schedule.  Please check to make sure this is ok.  I will notify WP.  Thanks!

## 2013-10-20 NOTE — Progress Notes (Signed)
  Subjective:    Patient ID: Tina Mckee, female    DOB: Jan 15, 1991, 22 y.o.   MRN: [id number]  HPI  Acute visit Onset yesterday of cough, rhinorrhea, body aches, wheezing, intermittent hoarseness. No fever. No sick contacts. Denies any nausea vomiting. She's had some mild headaches. No neck stiffness. No skin rash. She's had some tightness in her chest which she attributed to wheezing. No history of asthma. Nonsmoker. Does have positive family history of asthma in sister and mother.  Past Medical History  Diagnosis Date  . Stroke birth    rt hemiparesis  Perinatal  . Scoliosis   . Perinatal arterial ischemic stroke    Past Surgical History  Procedure Laterality Date  . Foot surgery      rt foot  . Foot surgery      Bone placed    reports that she quit smoking about 6 years ago. She has never used smokeless tobacco. She reports that she drinks about 0.5 ounces of alcohol per week. She reports that she does not use illicit drugs. family history includes Asthma in her mother and sister; Hypertension in her father and mother. Allergies  Allergen Reactions  . Cephalexin     REACTION: rash  with mono     Review of Systems  Constitutional: Positive for fatigue. Negative for fever and chills.  HENT: Positive for rhinorrhea, sore throat and voice change.   Respiratory: Positive for cough and wheezing.   Cardiovascular: Negative for chest pain.       Objective:   Physical Exam  Constitutional: She appears well-developed and well-nourished.  HENT:  Right Ear: External ear normal.  Left Ear: External ear normal.  Mouth/Throat: Oropharynx is clear and moist.  Neck: Neck supple.  Cardiovascular: Normal rate and regular rhythm.   Pulmonary/Chest:  Patient has some diffuse wheezes but no retractions. No rales.  Lymphadenopathy:    She has no cervical adenopathy.  Skin: No rash noted.          Assessment & Plan:  Wheezing associated respiratory illness. No respiratory  distress. Pulse oximetry 98%. Suspect viral. Prednisone taper over the next week. Albuterol inhaler for as needed use. Followup for any progressive symptoms

## 2013-10-20 NOTE — Telephone Encounter (Signed)
Pt is on the schedule  For this afternoon.

## 2013-10-20 NOTE — Telephone Encounter (Signed)
Spoke to the pt to see if she wanted [facility] to work her in later this afternoon.  Stated she will keep the appt with BB.

## 2013-10-20 NOTE — Patient Instructions (Signed)
Acute Bronchitis Bronchitis is inflammation of the airways that extend from the windpipe into the lungs (bronchi). The inflammation often causes mucus to develop. This leads to a cough, which is the most common symptom of bronchitis.  In acute bronchitis, the condition usually develops suddenly and goes away over time, usually in a couple weeks. Smoking, allergies, and asthma can make bronchitis worse. Repeated episodes of bronchitis may cause further lung problems.  CAUSES Acute bronchitis is most often caused by the same virus that causes a cold. The virus can spread from person to person (contagious).  SIGNS AND SYMPTOMS   Cough.   Fever.   Coughing up mucus.   Body aches.   Chest congestion.   Chills.   Shortness of breath.   Sore throat.  DIAGNOSIS  Acute bronchitis is usually diagnosed through a physical exam. Tests, such as chest X-rays, are sometimes done to rule out other conditions.  TREATMENT  Acute bronchitis usually goes away in a couple weeks. Often times, no medical treatment is necessary. Medicines are sometimes given for relief of fever or cough. Antibiotics are usually not needed but may be prescribed in certain situations. In some cases, an inhaler may be recommended to help reduce shortness of breath and control the cough. A cool mist vaporizer may also be used to help thin bronchial secretions and make it easier to clear the chest.  HOME CARE INSTRUCTIONS  Get plenty of rest.   Drink enough fluids to keep your urine clear or pale yellow (unless you have a medical condition that requires fluid restriction). Increasing fluids may help thin your secretions and will prevent dehydration.   Only take over-the-counter or prescription medicines as directed by your health care provider.   Avoid smoking and secondhand smoke. Exposure to cigarette smoke or irritating chemicals will make bronchitis worse. If you are a smoker, consider using nicotine gum or skin  patches to help control withdrawal symptoms. Quitting smoking will help your lungs heal faster.   Reduce the chances of another bout of acute bronchitis by washing your hands frequently, avoiding people with cold symptoms, and trying not to touch your hands to your mouth, nose, or eyes.   Follow up with your health care provider as directed.  SEEK MEDICAL CARE IF: Your symptoms do not improve after 1 week of treatment.  SEEK IMMEDIATE MEDICAL CARE IF:  You develop an increased fever or chills.   You have chest pain.   You have severe shortness of breath.  You have bloody sputum.   You develop dehydration.  You develop fainting.  You develop repeated vomiting.  You develop a severe headache. MAKE SURE YOU:   Understand these instructions.  Will watch your condition.  Will get help right away if you are not doing well or get worse. Document Released: 01/07/2005 Document Revised: 08/02/2013 Document Reviewed: 05/23/2013 [employer] Patient Information 2014 [employer].  

## 2013-11-17 ENCOUNTER — Encounter: Payer: Self-pay | Admitting: Nurse Practitioner

## 2013-11-17 ENCOUNTER — Ambulatory Visit (INDEPENDENT_AMBULATORY_CARE_PROVIDER_SITE_OTHER): Payer: BC Managed Care – PPO | Admitting: Nurse Practitioner

## 2013-11-17 ENCOUNTER — Encounter: Payer: Self-pay | Admitting: Internal Medicine

## 2013-11-17 VITALS — BP 120/80 | HR 71 | Temp 98.0°F | Ht 62.5 in | Wt 203.2 lb

## 2013-11-17 DIAGNOSIS — N926 Irregular menstruation, unspecified: Secondary | ICD-10-CM

## 2013-11-17 DIAGNOSIS — R1013 Epigastric pain: Secondary | ICD-10-CM

## 2013-11-17 LAB — POCT URINALYSIS DIPSTICK
Glucose, UA: NEGATIVE
Nitrite, UA: NEGATIVE
Urobilinogen, UA: 2
pH, UA: 6

## 2013-11-17 LAB — POCT URINE PREGNANCY: Preg Test, Ur: NEGATIVE

## 2013-11-17 MED ORDER — OMEPRAZOLE 40 MG PO CPDR: 40.0000 mg | DELAYED_RELEASE_CAPSULE | Freq: Every day | ORAL | Status: DC

## 2013-11-17 NOTE — Progress Notes (Signed)
Pre-visit discussion using our clinic review tool. No additional management support is needed unless otherwise documented below in the visit note.  

## 2013-11-17 NOTE — Progress Notes (Signed)
Subjective:     Tina Mckee is a 22 y.o. female who presents for evaluation of abdominal pain. Onset was 5 days ago. Symptoms have been unchanged. The pain is described as burning, and is 7/10 in intensity. Pain is located in the epigastric region without radiation.  Aggravating factors: eating.  Alleviating factors: using pepto-bismol and store brand antacid w/no relief. Associated symptoms: none. The patient denies belching, constipation, diarrhea, dysuria, fever, frequency, nausea and vomiting. Pt is tearful as she describes pain. She also c/o irreg menses. Had Mc 1 mo ago for 4 days, then started spotting again 2 da. Took home preg test 2 da-neg. Does not always take minipill at same hour every day.   The patient's history has been marked as reviewed and updated as appropriate.  Review of Systems Constitutional: negative for chills, fevers and weight loss Ears, nose, mouth, throat, and face: negative Respiratory: negative, treated for bronchitis 1 mo -feels better although still coughing some. Cardiovascular: negative for chest pain, irregular heart beat, lower extremity edema and near-syncope Gastrointestinal: positive for abdominal pain and reflux symptoms, negative for diarrhea and vomiting Genitourinary:negative Behavioral/Psych: states she drinks 2 drinks weekly now, but prior to daughter being born, she drank heavily, States father is alcoholic.     Objective:    BP 120/80  Pulse 71  Temp(Src) 98 F (36.7 C) (Oral)  Ht 5' 2.5" (1.588 m)  Wt 203 lb 4 oz (92.194 kg)  BMI 36.56 kg/m2  SpO2 98%  LMP 11/03/2013 General appearance: alert, cooperative, appears stated age, mild distress and tearful as she discusses pain and fear that she may be pregnant. Head: Normocephalic, without obvious abnormality, atraumatic Eyes: negative findings: lids and lashes normal and conjunctivae and sclerae normal Throat: lips, mucosa, and tongue normal; teeth and gums normal and ips are chapped.  jaw is asymmetrical, short mandible. Lungs: clear to auscultation bilaterally Heart: regular rate and rhythm, S1, S2 normal, no murmur, click, rub or gallop Abdomen: No HSM, epigastric tenderness    Assessment:    Abdominal pain, likely secondary to PUD, GERD, or gastritis, UTI.    Strong odor to urine when ran preg test:sent for culture POC ua-sm bili, mod blood, trace protein, tr leuks.  Abnormal menses, likely r/t not taking minipill consistently every 24 hours POC preg-neg     Plan:   PPI X 4 weeks, diet changes. See pt instructions. Will monitor for urine cx results. Discussed more reliable birth control method such as mirena or depo-provera. Pt does not want implanon. Pt will consider.

## 2013-11-17 NOTE — Patient Instructions (Signed)
I think your pain is related to gastritis, reflux, or peptic ulcer disease. Start omeprazole daily. Eat bland diet-no spicy foods, no carbonated drinks or strong peppermint. Eat Fruits & vegetables, yogurt, baked or broiled meats. 4-5 small meals rather than 3 meals. Limit coffee and alcohol as these can make symptoms worse. If your pain does not get better, you will need further tests like blood work and possibly endoscopy. Regarding irregular menses, progesterone oral birth control is time sensitive. It works best when you take within the same hour every day. The efficacy declines for preventing pregnancy when it is not taken every 24 hours. Not taking it every 24 hours could also cause spotting. Your pregnancy test today is negative. Consider a more reliable method such as mirena or depo-provera.  Please follow up with Dr Fabian Sharp in 3 weeks or sooner if pain gets worse. Pleasure to meet you!  Peptic Ulcer A peptic ulcer is a sore in the lining of in your esophagus (esophageal ulcer), stomach (gastric ulcer), or in the first part of your small intestine (duodenal ulcer). The ulcer causes erosion into the deeper tissue. CAUSES  Normally, the lining of the stomach and the small intestine protects itself from the acid that digests food. The protective lining can be damaged by:  An infection caused by a bacterium called Helicobacter pylori (H. pylori).  Regular use of nonsteroidal anti-inflammatory drugs (NSAIDs), such as ibuprofen or aspirin.  Smoking tobacco. Other risk factors include being older than 50, drinking alcohol excessively, and having a family history of ulcer disease.  SYMPTOMS   Burning pain or gnawing in the area between the chest and the belly button.  Heartburn.  Nausea and vomiting.  Bloating. The pain can be worse on an empty stomach and at night. If the ulcer results in bleeding, it can cause:  Black, tarry stools.  Vomiting of bright red blood.  Vomiting of coffee  ground looking materials. DIAGNOSIS  A diagnosis is usually made based upon your history and an exam. Other tests and procedures may be performed to find the cause of the ulcer. Finding a cause will help determine the best treatment. Tests and procedures may include:  Blood tests, stool tests, or breath tests to check for the bacterium H. pylori.  An upper gastrointestinal (GI) series of the esophagus, stomach, and small intestine.  An endoscopy to examine the esophagus, stomach, and small intestine.  A biopsy. TREATMENT  Treatment may include:  Eliminating the cause of the ulcer, such as smoking, NSAIDs, or alcohol.  Medicines to reduce the amount of acid in your digestive tract.  Antibiotic medicines if the ulcer is caused by the H. pylori bacterium.  An upper endoscopy to treat a bleeding ulcer.  Surgery if the bleeding is severe or if the ulcer created a hole somewhere in the digestive system. HOME CARE INSTRUCTIONS   Avoid tobacco, alcohol, and caffeine. Smoking can increase the acid in the stomach, and continued smoking will impair the healing of ulcers.  Avoid foods and drinks that seem to cause discomfort or aggravate your ulcer.  Only take medicines as directed by your caregiver. Do not substitute over-the-counter medicines for prescription medicines without talking to your caregiver.  Keep any follow-up appointments and tests as directed. SEEK MEDICAL CARE IF:   Your do not improve within 7 days of starting treatment.  You have ongoing indigestion or heartburn. SEEK IMMEDIATE MEDICAL CARE IF:   You have sudden, sharp, or persistent abdominal pain.  You have  bloody or dark black, tarry stools.  You vomit blood or vomit that looks like coffee grounds.  You become light headed, weak, or feel faint.  You become sweaty or clammy. MAKE SURE YOU:   Understand these instructions.  Will watch your condition.  Will get help right away if you are not doing well or  get worse. Document Released: 11/27/2000 Document Revised: 08/24/2012 Document Reviewed: 06/29/2012 [facility] Patient Information 2014 [place], [place].

## 2013-11-19 LAB — URINALYSIS, ROUTINE W REFLEX MICROSCOPIC
Blood, UA: POSITIVE — AB
Glucose: NEGATIVE
Ketones, UA: NEGATIVE
Leukocytes, UA: NEGATIVE
Protein, UA: NEGATIVE
pH: 5.5

## 2013-11-28 ENCOUNTER — Telehealth: Payer: Self-pay | Admitting: Nurse Practitioner

## 2013-11-28 NOTE — Telephone Encounter (Signed)
Urine culture appears to be contaminated_ 70,000 colonies, polymorphous bacteria. Urine micro shows trace blood o/w nml. Pt reported having menses. No treatment.

## 2013-11-28 NOTE — Telephone Encounter (Signed)
[employer] and they are faxing results to office.

## 2013-12-12 ENCOUNTER — Ambulatory Visit: Payer: BC Managed Care – PPO | Admitting: Internal Medicine

## 2013-12-14 NOTE — L&D Delivery Note (Signed)
Patient is 23 y.o. [id number] [redacted]w[redacted]d admitted for IOL 2/2 gHTN with superimposed preeclampsia, hx of stroke at birth, CF carrier, obesity, Rh neg, rubella non-immune   Delivery Note At 6:49 PM a viable female was delivered via Vaginal, Spontaneous Delivery (Presentation: Left Occiput Anterior).  APGAR: 8, 8; weight  .   Placenta status: Intact, Spontaneous.  Cord: 3 vessels with the following complications: None.   Anesthesia: Epidural  Episiotomy: None Lacerations: None Suture Repair: n/a Est. Blood Loss (mL): 450 -received cytotec 1058mcg PR  Mom to postpartum.  Baby to Couplet care / Skin to Skin.  Sangeeta Youse ROCIO 12/13/2014, 7:21 PM

## 2014-02-05 ENCOUNTER — Other Ambulatory Visit: Payer: Self-pay

## 2014-02-05 DIAGNOSIS — R1013 Epigastric pain: Secondary | ICD-10-CM

## 2014-02-05 MED ORDER — OMEPRAZOLE 40 MG PO CPDR: 40.0000 mg | DELAYED_RELEASE_CAPSULE | Freq: Every day | ORAL | Status: DC

## 2014-04-20 ENCOUNTER — Encounter: Payer: Self-pay | Admitting: Obstetrics & Gynecology

## 2014-04-20 ENCOUNTER — Ambulatory Visit (INDEPENDENT_AMBULATORY_CARE_PROVIDER_SITE_OTHER): Payer: BC Managed Care – PPO | Admitting: Obstetrics & Gynecology

## 2014-04-20 VITALS — BP 130/88 | Ht 62.0 in | Wt 206.0 lb

## 2014-04-20 DIAGNOSIS — Z3201 Encounter for pregnancy test, result positive: Secondary | ICD-10-CM

## 2014-04-20 LAB — POCT URINE PREGNANCY: PREG TEST UR: POSITIVE

## 2014-04-20 NOTE — Progress Notes (Signed)
Patient ID: Tina Mckee, female   DOB: 1991-01-10, 23 y.o.   MRN: [id number] Pt here for pregnancy test, resulted positive. Pt states LMP 03/12/2014. Pt to return in 2 weeks for new ob. Pt states taking PNV.

## 2014-04-28 ENCOUNTER — Encounter: Payer: Self-pay | Admitting: Internal Medicine

## 2014-04-30 ENCOUNTER — Ambulatory Visit (INDEPENDENT_AMBULATORY_CARE_PROVIDER_SITE_OTHER): Payer: BC Managed Care – PPO | Admitting: Internal Medicine

## 2014-04-30 ENCOUNTER — Encounter: Payer: Self-pay | Admitting: Internal Medicine

## 2014-04-30 VITALS — BP 126/90 | Temp 98.6°F | Ht 62.5 in | Wt 204.0 lb

## 2014-04-30 DIAGNOSIS — G8190 Hemiplegia, unspecified affecting unspecified side: Secondary | ICD-10-CM

## 2014-04-30 DIAGNOSIS — G8191 Hemiplegia, unspecified affecting right dominant side: Secondary | ICD-10-CM

## 2014-04-30 DIAGNOSIS — Z331 Pregnant state, incidental: Secondary | ICD-10-CM

## 2014-04-30 DIAGNOSIS — J069 Acute upper respiratory infection, unspecified: Secondary | ICD-10-CM

## 2014-04-30 NOTE — Patient Instructions (Addendum)
This acts like a viral respiratory infectoin viral.  Although could have allergy also . This may last a week.  Your chest exam is normal today.  Can use afrin nose spray for 3 days limit to help with congestion and face pain.  alos nasanex nose spray for nose allergy sx . Avoid deocngestants  Hot decon tea and honey  Can calm cough in children . Can ask ob office what they advise with a head and chest cold . Plain mucinex   Ok.  Other cough meds have hydorcodone narcotic ingredients.  Contact us  If face pain is not improving   After the week or if worse.

## 2014-04-30 NOTE — Progress Notes (Signed)
Pre visit review using our clinic review tool, if applicable. No additional management support is needed unless otherwise documented below in the visit note.   Chief Complaint  Patient presents with  . Cough    Started Saturday.  Patient is [redacted] weeks pregnant.  . Nasal Congestion  . Abdominal Pain    HPI: Patient comes in today for SDA for  new problem evaluation. 3-4 days of "allergy" but now worse  ? Daughter  Got sick  Recently.  Allergies and now she is sick .  Cough is worset sx and hurting stomach after coughing but no vomitng chills or wheezing , has face pressure tenderness and very stuffy nose.[facility] in [place].  She is [redacted] weeks pregnant has first visit this week .  ROS: See pertinent positives and negatives per HPI.remote hx of bronchitis  Not seeing neuro stable since dec stress   Past Medical History  Diagnosis Date  . Stroke birth    rt hemiparesis  Perinatal  . Scoliosis   . Perinatal arterial ischemic stroke     Family History  Problem Relation Age of Onset  . Hypertension Mother   . Asthma Mother   . Hypertension Father   . Asthma Sister     History   Social History  . Marital Status: Single    Spouse Name: N/A    Number of Children: N/A  . Years of Education: N/A   Social History Main Topics  . Smoking status: Former Smoker    Quit date: 07/11/2007  . Smokeless tobacco: Never Used  . Alcohol Use: No     Comment: prior to pregnancy  . Drug Use: No  . Sexual Activity: Yes    Birth Control/ Protection: Pill   Other Topics Concern  . 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  5 days per week 2 days off .     Currently nonsmoking    HH of 3 no pets     had mild preeclampsia with vaginal childbirth.  2011   ocass etoh.    Exercise  Walking     Sleep ok    caffiene limited per day .                 Outpatient Encounter Prescriptions as of  04/30/2014  Medication Sig  . Prenatal Multivit-Min-Fe-FA (PRENATAL VITAMINS PO) Take 1 tablet by mouth daily.  Marland Kitchen albuterol (PROVENTIL HFA;VENTOLIN HFA) 108 (90 BASE) MCG/ACT inhaler Inhale 2 puffs into the lungs every 4 (four) hours as needed for wheezing or shortness of breath.  . Multiple Vitamins-Minerals (WOMENS MULTIVITAMIN PLUS PO) Take 1 tablet by mouth daily.    . norethindrone (MICRONOR,CAMILA,ERRIN) 0.35 MG tablet Take 1 tablet (0.35 mg total) by mouth daily.  Marland Kitchen omeprazole (PRILOSEC) 40 MG capsule Take 1 capsule (40 mg total) by mouth daily.    EXAM:  BP 126/90  Temp(Src) 98.6 F (37 C) (Oral)  Ht 5' 2.5" (1.588 m)  Wt 204 lb (92.534 kg)  BMI 36.69 kg/m2  LMP 03/12/2014  Body mass index is 36.69 kg/(m^2). WDWN in NAD  quiet respirations;very nasally congested  somewhat hoarse. Non toxic . HEENT: Normocephalic ;atraumatic , Eyes;  PERRL, EOMs  Full, lids and conjunctiva clear,,Ears: no deformities, canals nl, TM landmarks normal, Nose: no deformity clear  discharge congested;face minimally tender Mouth : OP clear without lesion  or edema . Neck: Supple without adenopathy or masses or bruits Chest:  Clear to A without wheezes rales or rhonchi CV:  S1-S2 no gallops or murmurs peripheral perfusion is normal Skin :nl perfusion and no acute rashes  abd soft no masses's noted no g or r  PSYCH: pleasant and cooperative, no obvious depression or anxiety  ASSESSMENT AND PLAN:  Discussed the following assessment and plan:  Acute upper respiratory infection of multiple sites - samples o nasaonex given local care fu if needed chest clear today expectant managmentfor complications  Pregnancy as incidental finding  -Patient advised to return or notify health care team  if symptoms worsen ,persist or new concerns arise.  Patient Instructions  This acts like a viral respiratory infectoin viral.  Although could have allergy also . This may last a week.  Your chest exam is normal  today.  Can use afrin nose spray for 3 days limit to help with congestion and face pain.  alos nasanex nose spray for nose allergy sx . Avoid deocngestants  Hot decon tea and honey  Can calm cough in children . Can ask ob office what they advise with a head and chest cold . Plain mucinex   Ok.  Other cough meds have hydorcodone narcotic ingredients.  Contact us  If face pain is not improving   After the week or if worse.     Standley Brooking. Panosh M.D.

## 2014-05-02 ENCOUNTER — Other Ambulatory Visit: Payer: Self-pay | Admitting: Obstetrics and Gynecology

## 2014-05-02 DIAGNOSIS — O3680X0 Pregnancy with inconclusive fetal viability, not applicable or unspecified: Secondary | ICD-10-CM

## 2014-05-03 ENCOUNTER — Other Ambulatory Visit: Payer: Self-pay | Admitting: Obstetrics and Gynecology

## 2014-05-03 ENCOUNTER — Ambulatory Visit (INDEPENDENT_AMBULATORY_CARE_PROVIDER_SITE_OTHER): Payer: BC Managed Care – PPO

## 2014-05-03 DIAGNOSIS — O26849 Uterine size-date discrepancy, unspecified trimester: Secondary | ICD-10-CM

## 2014-05-03 DIAGNOSIS — O3680X0 Pregnancy with inconclusive fetal viability, not applicable or unspecified: Secondary | ICD-10-CM

## 2014-05-03 NOTE — Progress Notes (Signed)
U/S-single IUP with +FCA noted, FHR-132 bpm, CRL c/w 6+3wks EDD 12/24/2014, cx appears long and closed, bilateral adnexa appears wnl with C.L. Noted on RT, no free fluid noted within the pelvis

## 2014-05-04 ENCOUNTER — Ambulatory Visit (INDEPENDENT_AMBULATORY_CARE_PROVIDER_SITE_OTHER): Payer: BC Managed Care – PPO | Admitting: Adult Health

## 2014-05-04 ENCOUNTER — Encounter: Payer: Self-pay | Admitting: Adult Health

## 2014-05-04 VITALS — BP 120/78 | Wt 207.0 lb

## 2014-05-04 DIAGNOSIS — M4120 Other idiopathic scoliosis, site unspecified: Secondary | ICD-10-CM

## 2014-05-04 DIAGNOSIS — Z331 Pregnant state, incidental: Secondary | ICD-10-CM

## 2014-05-04 DIAGNOSIS — Z1389 Encounter for screening for other disorder: Secondary | ICD-10-CM

## 2014-05-04 DIAGNOSIS — O360990 Maternal care for other rhesus isoimmunization, unspecified trimester, not applicable or unspecified: Secondary | ICD-10-CM

## 2014-05-04 DIAGNOSIS — Z3480 Encounter for supervision of other normal pregnancy, unspecified trimester: Secondary | ICD-10-CM

## 2014-05-04 DIAGNOSIS — G8191 Hemiplegia, unspecified affecting right dominant side: Secondary | ICD-10-CM

## 2014-05-04 DIAGNOSIS — O09899 Supervision of other high risk pregnancies, unspecified trimester: Secondary | ICD-10-CM | POA: Insufficient documentation

## 2014-05-04 DIAGNOSIS — Z8673 Personal history of transient ischemic attack (TIA), and cerebral infarction without residual deficits: Secondary | ICD-10-CM | POA: Insufficient documentation

## 2014-05-04 HISTORY — DX: Encounter for supervision of other normal pregnancy, unspecified trimester: Z34.80

## 2014-05-04 LAB — POCT URINALYSIS DIPSTICK
Glucose, UA: NEGATIVE
Ketones, UA: NEGATIVE
Leukocytes, UA: NEGATIVE
NITRITE UA: NEGATIVE
Protein, UA: NEGATIVE
RBC UA: NEGATIVE

## 2014-05-04 LAB — CBC
HCT: 41 % (ref 36.0–46.0)
Hemoglobin: 14.2 g/dL (ref 12.0–15.0)
MCH: 29.2 pg (ref 26.0–34.0)
MCHC: 34.6 g/dL (ref 30.0–36.0)
MCV: 84.4 fL (ref 78.0–100.0)
Platelets: 408 10*3/uL — ABNORMAL HIGH (ref 150–400)
RBC: 4.86 MIL/uL (ref 3.87–5.11)
RDW: 13.4 % (ref 11.5–15.5)
WBC: 10.8 10*3/uL — ABNORMAL HIGH (ref 4.0–10.5)

## 2014-05-04 NOTE — Progress Notes (Signed)
Subjective:  Tina Mckee is a 23 y.o. G78P1001 Caucasian female at [redacted]w[redacted]d by [place] being seen today for her first obstetrical visit.  Her obstetrical history is significant for stroke at birth with right sided weakness, had preeclampsia last pregnancy delivered at 37 weeks.  Pregnancy history fully reviewed.  Patient reports no complaints. Denies vb, cramping, uti s/s, abnormal/malodorous vag d/c, or vulvovaginal itching/irritation.  BP 120/78  Wt 207 lb (93.895 kg)  LMP 03/12/2014  HISTORY: OB History  Gravida Para Term Preterm AB SAB TAB Ectopic Multiple Living  2 1 1  0      1    # Outcome Date GA Lbr Len/2nd Weight Sex Delivery Anes PTL Lv  2 CUR           1 TRM 04/10/10 [redacted]w[redacted]d   F  EPI  Y     Past Medical History  Diagnosis Date  . Stroke birth    rt hemiparesis  Perinatal  . Scoliosis   . Perinatal arterial ischemic stroke   . Gestational hypertension   . Supervision of other normal pregnancy 05/04/2014  . Asthma    Past Surgical History  Procedure Laterality Date  . Foot surgery      rt foot  . Foot surgery      Bone placed   Family History  Problem Relation Age of Onset  . Hypertension Mother   . Asthma Mother   . Hypertension Father   . Asthma Sister   . Cancer Maternal Grandfather     pancreatic  . Cancer Paternal Grandmother     cancer all over  . Other Paternal Grandfather     brain damage    Exam   System:     General: Well developed & nourished, no acute distress   Skin: Warm & dry, normal coloration and turgor, no rashes   Neurologic: Alert & oriented, normal mood   Cardiovascular: Regular rate & rhythm   Respiratory: Effort & rate normal, LCTAB, acyanotic   Abdomen: Soft, non tender   Extremities: normal strength, tone   Pelvic Exam:    Perineum: deferred   Vulva: deferrred   Vagina:  deferred   Cervix: deferred   Uterus: deferred   Thin prep pap smear  Was negative 09/29/13 FHR: 141 [place]   Assessment:   Pregnancy: G2P1001 Patient  Active Problem List   Diagnosis Date Noted  . Supervision of other normal pregnancy 05/04/2014  . Pregnancy as incidental finding 04/30/2014  . Acute upper respiratory infection of multiple sites 04/30/2014  . Obesity (BMI 30-39.9) 09/29/2013  . Thrombocytosis 10/01/2012  . High blood triglycerides 09/27/2012  . Abnormal LFTs 09/27/2012  . Weight gain 09/27/2012  . Visit for preventive health examination 09/27/2012  . Shaking 04/22/2012  . Swelling 04/22/2012  . Hyperglycemia 09/23/2011  . Routine gynecological examination 09/21/2011  . Abnormal glucose 09/21/2011  . Falling episodes 09/21/2011  . Cervical radicular pain 08/03/2011  . Wrist pain 02/16/2011  . Hemiparesis, right 02/16/2011  . ADVERSE REACTION TO MEDICATION 07/15/2009  . SCOLIOSIS 01/16/2009    [redacted]w[redacted]d G2P1001 New OB visit     Plan:  Initial labs drawn Continue prenatal vitamins Problem list reviewed and updated Reviewed n/v relief measures and warning s/s to report Reviewed recommended weight gain based on pre-gravid BMI Encouraged well-balanced diet Genetic Screening discussed Integrated Screen: requested Cystic fibrosis screening discussed declined Ultrasound discussed; fetal survey: requested Follow up in 4 weeks for OB visit with Dr Elonda Husky [place]ill Dooms, NP 05/04/2014  11:12 AM

## 2014-05-04 NOTE — Patient Instructions (Signed)

## 2014-05-05 LAB — URINALYSIS
BILIRUBIN URINE: NEGATIVE
GLUCOSE, UA: NEGATIVE mg/dL
Hgb urine dipstick: NEGATIVE
Ketones, ur: NEGATIVE mg/dL
Leukocytes, UA: NEGATIVE
Nitrite: NEGATIVE
PROTEIN: NEGATIVE mg/dL
Specific Gravity, Urine: 1.023 (ref 1.005–1.030)
Urobilinogen, UA: 0.2 mg/dL (ref 0.0–1.0)
pH: 5.5 (ref 5.0–8.0)

## 2014-05-05 LAB — DRUG SCREEN, URINE, NO CONFIRMATION
Amphetamine Screen, Ur: NEGATIVE
Barbiturate Quant, Ur: NEGATIVE
Benzodiazepines.: NEGATIVE
COCAINE METABOLITES: NEGATIVE
Creatinine,U: 169 mg/dL
Marijuana Metabolite: NEGATIVE
Methadone: NEGATIVE
Opiate Screen, Urine: NEGATIVE
PHENCYCLIDINE (PCP): NEGATIVE
Propoxyphene: NEGATIVE

## 2014-05-05 LAB — HEPATITIS B SURFACE ANTIGEN: Hepatitis B Surface Ag: NEGATIVE

## 2014-05-05 LAB — RUBELLA SCREEN: RUBELLA: 0.88 {index} (ref <0.90)

## 2014-05-05 LAB — ABO AND RH: Rh Type: NEGATIVE

## 2014-05-05 LAB — HIV ANTIBODY (ROUTINE TESTING W REFLEX): HIV: NONREACTIVE

## 2014-05-05 LAB — ANTIBODY SCREEN: Antibody Screen: NEGATIVE

## 2014-05-05 LAB — GC/CHLAMYDIA PROBE AMP
CT Probe RNA: NEGATIVE
GC Probe RNA: NEGATIVE

## 2014-05-05 LAB — TSH: TSH: 1.099 u[IU]/mL (ref 0.350–4.500)

## 2014-05-05 LAB — OXYCODONE SCREEN, UA, RFLX CONFIRM: OXYCODONE SCRN UR: NEGATIVE ng/mL

## 2014-05-05 LAB — RPR

## 2014-05-05 LAB — VARICELLA ZOSTER ANTIBODY, IGG: Varicella IgG: 905.3 Index — ABNORMAL HIGH (ref <135.00)

## 2014-05-06 LAB — URINE CULTURE: Colony Count: >=100000

## 2014-05-08 ENCOUNTER — Encounter: Payer: Self-pay | Admitting: Adult Health

## 2014-05-29 ENCOUNTER — Ambulatory Visit (INDEPENDENT_AMBULATORY_CARE_PROVIDER_SITE_OTHER): Payer: BC Managed Care – PPO | Admitting: Obstetrics and Gynecology

## 2014-05-29 VITALS — BP 120/76 | Wt 202.0 lb

## 2014-05-29 DIAGNOSIS — Z1389 Encounter for screening for other disorder: Secondary | ICD-10-CM

## 2014-05-29 DIAGNOSIS — Z331 Pregnant state, incidental: Secondary | ICD-10-CM

## 2014-05-29 DIAGNOSIS — Z3480 Encounter for supervision of other normal pregnancy, unspecified trimester: Secondary | ICD-10-CM

## 2014-05-29 LAB — POCT URINALYSIS DIPSTICK
Blood, UA: NEGATIVE
GLUCOSE UA: NEGATIVE
Ketones, UA: NEGATIVE
LEUKOCYTES UA: NEGATIVE
NITRITE UA: NEGATIVE
Protein, UA: NEGATIVE

## 2014-05-29 MED ORDER — ASPIRIN EC 81 MG PO TBEC: 81.0000 mg | DELAYED_RELEASE_TABLET | Freq: Every day | ORAL | Status: DC

## 2014-05-29 NOTE — Progress Notes (Signed)
[redacted]w[redacted]d G2P1001 1. Hx PreE with first preg---> ASA 81 mg /day 2. Sx noted of heart fluttering intermittently, noted several x /day. No syncope. Pt feeels "winded", No vision changes.      Pulse is 70-80  With perception of a skipped beat. Not able to catch the irregularity on pulse monitoring. Will follow for now. Consider holter if pt becomes symptomatic.

## 2014-05-29 NOTE — Progress Notes (Signed)
Pt states that she is having chest pain, for about a week, pain kind of comes and goes, and heart feels like fluttering.

## 2014-06-01 ENCOUNTER — Encounter: Payer: BC Managed Care – PPO | Admitting: Obstetrics and Gynecology

## 2014-06-26 ENCOUNTER — Other Ambulatory Visit: Payer: BC Managed Care – PPO

## 2014-07-02 ENCOUNTER — Ambulatory Visit (INDEPENDENT_AMBULATORY_CARE_PROVIDER_SITE_OTHER): Payer: BC Managed Care – PPO | Admitting: Obstetrics & Gynecology

## 2014-07-02 ENCOUNTER — Other Ambulatory Visit: Payer: BC Managed Care – PPO

## 2014-07-02 ENCOUNTER — Encounter: Payer: Self-pay | Admitting: Obstetrics & Gynecology

## 2014-07-02 VITALS — BP 100/80 | Wt 201.0 lb

## 2014-07-02 DIAGNOSIS — Z1389 Encounter for screening for other disorder: Secondary | ICD-10-CM

## 2014-07-02 DIAGNOSIS — Z3482 Encounter for supervision of other normal pregnancy, second trimester: Secondary | ICD-10-CM

## 2014-07-02 DIAGNOSIS — Z3480 Encounter for supervision of other normal pregnancy, unspecified trimester: Secondary | ICD-10-CM

## 2014-07-02 DIAGNOSIS — Z331 Pregnant state, incidental: Secondary | ICD-10-CM

## 2014-07-02 LAB — POCT URINALYSIS DIPSTICK
Blood, UA: NEGATIVE
GLUCOSE UA: NEGATIVE
Ketones, UA: NEGATIVE
NITRITE UA: NEGATIVE
PROTEIN UA: NEGATIVE

## 2014-07-02 NOTE — Addendum Note (Signed)
Addended by: Doyne Keel on: 07/02/2014 02:27 PM   Modules accepted: Orders

## 2014-07-02 NOTE — Progress Notes (Signed)
G2P1001 [redacted]w[redacted]d Estimated Date of Delivery: 12/24/14  Blood pressure 100/80, weight 201 lb (91.173 kg), last menstrual period 03/12/2014.   BP weight and urine results all reviewed and noted.  Please refer to the obstetrical flow sheet for the fundal height and fetal heart rate documentation:  Patient reports good fetal movement, denies any bleeding and no rupture of membranes symptoms or regular contractions. Patient is without complaints. All questions were answered.  Plan:  Continued routine obstetrical care,   Follow up in 4 weeks for OB appointment, sonogram Quad today

## 2014-07-03 LAB — AFP, QUAD SCREEN
AFP: 23.4 IU/mL
Age Alone: 1:1030 {titer}
CURR GEST AGE: 15 wks.days
Down Syndrome Scr Risk Est: 1:15600 {titer}
HCG TOTAL: 30233 m[IU]/mL
INH: 267.9 pg/mL
INTERPRETATION-AFP: NEGATIVE
MOM FOR AFP: 1.07
MOM FOR INH: 1.74
MoM for hCG: 1.17
OPEN SPINA BIFIDA: NEGATIVE
Osb Risk: 1:10600 {titer}
Tri 18 Scr Risk Est: NEGATIVE
Trisomy 18 (Edward) Syndrome Interp.: <1:90600 {titer}
UE3 MOM: 2.68
uE3 Value: 0.9 ng/mL

## 2014-07-31 ENCOUNTER — Ambulatory Visit (INDEPENDENT_AMBULATORY_CARE_PROVIDER_SITE_OTHER): Payer: BC Managed Care – PPO

## 2014-07-31 ENCOUNTER — Other Ambulatory Visit: Payer: Self-pay | Admitting: Obstetrics & Gynecology

## 2014-07-31 ENCOUNTER — Encounter: Payer: Self-pay | Admitting: Women's Health

## 2014-07-31 ENCOUNTER — Ambulatory Visit (INDEPENDENT_AMBULATORY_CARE_PROVIDER_SITE_OTHER): Payer: BC Managed Care – PPO | Admitting: Women's Health

## 2014-07-31 VITALS — BP 122/80 | Wt 203.0 lb

## 2014-07-31 DIAGNOSIS — Z6791 Unspecified blood type, Rh negative: Secondary | ICD-10-CM | POA: Insufficient documentation

## 2014-07-31 DIAGNOSIS — Z1389 Encounter for screening for other disorder: Secondary | ICD-10-CM

## 2014-07-31 DIAGNOSIS — Z3480 Encounter for supervision of other normal pregnancy, unspecified trimester: Secondary | ICD-10-CM

## 2014-07-31 DIAGNOSIS — Z283 Underimmunization status: Secondary | ICD-10-CM | POA: Insufficient documentation

## 2014-07-31 DIAGNOSIS — O360121 Maternal care for anti-D [Rh] antibodies, second trimester, fetus 1: Secondary | ICD-10-CM

## 2014-07-31 DIAGNOSIS — Z363 Encounter for antenatal screening for malformations: Secondary | ICD-10-CM

## 2014-07-31 DIAGNOSIS — O09299 Supervision of pregnancy with other poor reproductive or obstetric history, unspecified trimester: Secondary | ICD-10-CM

## 2014-07-31 DIAGNOSIS — Z331 Pregnant state, incidental: Secondary | ICD-10-CM

## 2014-07-31 DIAGNOSIS — O09292 Supervision of pregnancy with other poor reproductive or obstetric history, second trimester: Secondary | ICD-10-CM

## 2014-07-31 DIAGNOSIS — Z3482 Encounter for supervision of other normal pregnancy, second trimester: Secondary | ICD-10-CM

## 2014-07-31 DIAGNOSIS — O26899 Other specified pregnancy related conditions, unspecified trimester: Secondary | ICD-10-CM

## 2014-07-31 DIAGNOSIS — Z2839 Other underimmunization status: Secondary | ICD-10-CM

## 2014-07-31 DIAGNOSIS — Z8759 Personal history of other complications of pregnancy, childbirth and the puerperium: Secondary | ICD-10-CM

## 2014-07-31 DIAGNOSIS — O9989 Other specified diseases and conditions complicating pregnancy, childbirth and the puerperium: Secondary | ICD-10-CM

## 2014-07-31 LAB — POCT URINALYSIS DIPSTICK
Glucose, UA: NEGATIVE
KETONES UA: NEGATIVE
LEUKOCYTES UA: NEGATIVE
NITRITE UA: NEGATIVE
RBC UA: NEGATIVE

## 2014-07-31 NOTE — Progress Notes (Signed)
Low-risk OB appointment G2P1001 [redacted]w[redacted]d Estimated Date of Delivery: 12/24/14 BP 122/80  Wt 203 lb (92.08 kg)  LMP 03/12/2014  BP, weight, and urine reviewed.  Refer to obstetrical flow sheet for FH & FHR.  Reports good fm.  Denies regular uc's, lof, vb, or uti s/s. Still having occ nose bleeds, stops easily- reassured Reviewed ptl s/s, fm. Plan:  Continue routine obstetrical care  F/U in 4wks for OB appointment

## 2014-07-31 NOTE — Patient Instructions (Signed)
Second Trimester of Pregnancy The second trimester is from week 13 through week 28, months 4 through 6. The second trimester is often a time when you feel your best. Your body has also adjusted to being pregnant, and you begin to feel better physically. Usually, morning sickness has lessened or quit completely, you may have more energy, and you may have an increase in appetite. The second trimester is also a time when the fetus is growing rapidly. At the end of the sixth month, the fetus is about 9 inches long and weighs about 1 pounds. You will likely begin to feel the baby move (quickening) between 18 and 20 weeks of the pregnancy. BODY CHANGES Your body goes through many changes during pregnancy. The changes vary from woman to woman.   Your weight will continue to increase. You will notice your lower abdomen bulging out.  You may begin to get stretch marks on your hips, abdomen, and breasts.  You may develop headaches that can be relieved by medicines approved by your health care provider.  You may urinate more often because the fetus is pressing on your bladder.  You may develop or continue to have heartburn as a result of your pregnancy.  You may develop constipation because certain hormones are causing the muscles that push waste through your intestines to slow down.  You may develop hemorrhoids or swollen, bulging veins (varicose veins).  You may have back pain because of the weight gain and pregnancy hormones relaxing your joints between the bones in your pelvis and as a result of a shift in weight and the muscles that support your balance.  Your breasts will continue to grow and be tender.  Your gums may bleed and may be sensitive to brushing and flossing.  Dark spots or blotches (chloasma, mask of pregnancy) may develop on your face. This will likely fade after the baby is born.  A dark line from your belly button to the pubic area (linea nigra) may appear. This will likely fade  after the baby is born.  You may have changes in your hair. These can include thickening of your hair, rapid growth, and changes in texture. Some women also have hair loss during or after pregnancy, or hair that feels dry or thin. Your hair will most likely return to normal after your baby is born. WHAT TO EXPECT AT YOUR PRENATAL VISITS During a routine prenatal visit:  You will be weighed to make sure you and the fetus are growing normally.  Your blood pressure will be taken.  Your abdomen will be measured to track your baby's growth.  The fetal heartbeat will be listened to.  Any test results from the previous visit will be discussed. Your health care provider may ask you:  How you are feeling.  If you are feeling the baby move.  If you have had any abnormal symptoms, such as leaking fluid, bleeding, severe headaches, or abdominal cramping.  If you have any questions. Other tests that may be performed during your second trimester include:  Blood tests that check for:  Low iron levels (anemia).  Gestational diabetes (between 24 and 28 weeks).  Rh antibodies.  Urine tests to check for infections, diabetes, or protein in the urine.  An ultrasound to confirm the proper growth and development of the baby.  An amniocentesis to check for possible genetic problems.  Fetal screens for spina bifida and Down syndrome. HOME CARE INSTRUCTIONS   Avoid all smoking, herbs, alcohol, and unprescribed   drugs. These chemicals affect the formation and growth of the baby.  Follow your health care provider's instructions regarding medicine use. There are medicines that are either safe or unsafe to take during pregnancy.  Exercise only as directed by your health care provider. Experiencing uterine cramps is a good sign to stop exercising.  Continue to eat regular, healthy meals.  Wear a good support bra for breast tenderness.  Do not use hot tubs, steam rooms, or saunas.  Wear your  seat belt at all times when driving.  Avoid raw meat, uncooked cheese, cat litter boxes, and soil used by cats. These carry germs that can cause birth defects in the baby.  Take your prenatal vitamins.  Try taking a stool softener (if your health care provider approves) if you develop constipation. Eat more high-fiber foods, such as fresh vegetables or fruit and whole grains. Drink plenty of fluids to keep your urine clear or pale yellow.  Take warm sitz baths to soothe any pain or discomfort caused by hemorrhoids. Use hemorrhoid cream if your health care provider approves.  If you develop varicose veins, wear support hose. Elevate your feet for 15 minutes, 3-4 times a day. Limit salt in your diet.  Avoid heavy lifting, wear low heel shoes, and practice good posture.  Rest with your legs elevated if you have leg cramps or low back pain.  Visit your dentist if you have not gone yet during your pregnancy. Use a soft toothbrush to brush your teeth and be gentle when you floss.  A sexual relationship may be continued unless your health care provider directs you otherwise.  Continue to go to all your prenatal visits as directed by your health care provider. SEEK MEDICAL CARE IF:   You have dizziness.  You have mild pelvic cramps, pelvic pressure, or nagging pain in the abdominal area.  You have persistent nausea, vomiting, or diarrhea.  You have a bad smelling vaginal discharge.  You have pain with urination. SEEK IMMEDIATE MEDICAL CARE IF:   You have a fever.  You are leaking fluid from your vagina.  You have spotting or bleeding from your vagina.  You have severe abdominal cramping or pain.  You have rapid weight gain or loss.  You have shortness of breath with chest pain.  You notice sudden or extreme swelling of your face, hands, ankles, feet, or legs.  You have not felt your baby move in over an hour.  You have severe headaches that do not go away with  medicine.  You have vision changes. Document Released: 11/24/2001 Document Revised: 12/05/2013 Document Reviewed: 01/31/2013 [employer] Patient Information 2015 [employer]. This information is not intended to replace advice given to you by your health care provider. Make sure you discuss any questions you have with your health care provider.  

## 2014-07-31 NOTE — Progress Notes (Signed)
U/S(19+1wks)-active fetus, meas c/w dates, fluid wnl, posterior Gr 0 placenta, cx appears closed (4.2cm), bilateral adnexa appears WNL, no major abnl noted, FHR-148 bpm

## 2014-08-28 ENCOUNTER — Ambulatory Visit (INDEPENDENT_AMBULATORY_CARE_PROVIDER_SITE_OTHER): Payer: BC Managed Care – PPO | Admitting: Women's Health

## 2014-08-28 ENCOUNTER — Encounter: Payer: Self-pay | Admitting: Women's Health

## 2014-08-28 VITALS — BP 122/78 | Wt 203.0 lb

## 2014-08-28 DIAGNOSIS — Z3482 Encounter for supervision of other normal pregnancy, second trimester: Secondary | ICD-10-CM

## 2014-08-28 DIAGNOSIS — Z3480 Encounter for supervision of other normal pregnancy, unspecified trimester: Secondary | ICD-10-CM

## 2014-08-28 DIAGNOSIS — Z1389 Encounter for screening for other disorder: Secondary | ICD-10-CM

## 2014-08-28 DIAGNOSIS — Z331 Pregnant state, incidental: Secondary | ICD-10-CM

## 2014-08-28 LAB — POCT URINALYSIS DIPSTICK
Blood, UA: NEGATIVE
Glucose, UA: NEGATIVE
Ketones, UA: NEGATIVE
Nitrite, UA: NEGATIVE
PROTEIN UA: NEGATIVE

## 2014-08-28 NOTE — Progress Notes (Signed)
Low-risk OB appointment G2P1001 [redacted]w[redacted]d Estimated Date of Delivery: 12/24/14 BP 122/78  Wt 203 lb (92.08 kg)  LMP 03/12/2014  BP, weight, and urine reviewed.  Refer to obstetrical flow sheet for FH & FHR.  Reports good fm.  Denies regular uc's, lof, vb, or uti s/s. Had asthma attack over weekend, in-date albuterol inhaler didn't really help, states she just slept it off- didn't want to go to hospital. Last attack was prior to beginning of pregnancy, usually brought on by allergies. Can try zyrtec or claritin. If happens again, and inhaler not working- to go immediately to hospital.  Reviewed ptl s/s, fm. Plan:  Continue routine obstetrical care  F/U in 4wks for OB appointment and pn2

## 2014-08-28 NOTE — Patient Instructions (Signed)
You will have your sugar test next visit.  Please do not eat or drink anything after midnight the night before you come, not even water.  You will be here for at least two hours.     Second Trimester of Pregnancy The second trimester is from week 13 through week 28, months 4 through 6. The second trimester is often a time when you feel your best. Your body has also adjusted to being pregnant, and you begin to feel better physically. Usually, morning sickness has lessened or quit completely, you may have more energy, and you may have an increase in appetite. The second trimester is also a time when the fetus is growing rapidly. At the end of the sixth month, the fetus is about 9 inches long and weighs about 1 pounds. You will likely begin to feel the baby move (quickening) between 18 and 20 weeks of the pregnancy. BODY CHANGES Your body goes through many changes during pregnancy. The changes vary from woman to woman.   Your weight will continue to increase. You will notice your lower abdomen bulging out.  You may begin to get stretch marks on your hips, abdomen, and breasts.  You may develop headaches that can be relieved by medicines approved by your health care provider.  You may urinate more often because the fetus is pressing on your bladder.  You may develop or continue to have heartburn as a result of your pregnancy.  You may develop constipation because certain hormones are causing the muscles that push waste through your intestines to slow down.  You may develop hemorrhoids or swollen, bulging veins (varicose veins).  You may have back pain because of the weight gain and pregnancy hormones relaxing your joints between the bones in your pelvis and as a result of a shift in weight and the muscles that support your balance.  Your breasts will continue to grow and be tender.  Your gums may bleed and may be sensitive to brushing and flossing.  Dark spots or blotches (chloasma, mask of  pregnancy) may develop on your face. This will likely fade after the baby is born.  A dark line from your belly button to the pubic area (linea nigra) may appear. This will likely fade after the baby is born.  You may have changes in your hair. These can include thickening of your hair, rapid growth, and changes in texture. Some women also have hair loss during or after pregnancy, or hair that feels dry or thin. Your hair will most likely return to normal after your baby is born. WHAT TO EXPECT AT YOUR PRENATAL VISITS During a routine prenatal visit:  You will be weighed to make sure you and the fetus are growing normally.  Your blood pressure will be taken.  Your abdomen will be measured to track your baby's growth.  The fetal heartbeat will be listened to.  Any test results from the previous visit will be discussed. Your health care provider may ask you:  How you are feeling.  If you are feeling the baby move.  If you have had any abnormal symptoms, such as leaking fluid, bleeding, severe headaches, or abdominal cramping.  If you have any questions. Other tests that may be performed during your second trimester include:  Blood tests that check for:  Low iron levels (anemia).  Gestational diabetes (between 24 and 28 weeks).  Rh antibodies.  Urine tests to check for infections, diabetes, or protein in the urine.  An ultrasound  to confirm the proper growth and development of the baby.  An amniocentesis to check for possible genetic problems.  Fetal screens for spina bifida and Down syndrome. HOME CARE INSTRUCTIONS   Avoid all smoking, herbs, alcohol, and unprescribed drugs. These chemicals affect the formation and growth of the baby.  Follow your health care provider's instructions regarding medicine use. There are medicines that are either safe or unsafe to take during pregnancy.  Exercise only as directed by your health care provider. Experiencing uterine cramps is a  good sign to stop exercising.  Continue to eat regular, healthy meals.  Wear a good support bra for breast tenderness.  Do not use hot tubs, steam rooms, or saunas.  Wear your seat belt at all times when driving.  Avoid raw meat, uncooked cheese, cat litter boxes, and soil used by cats. These carry germs that can cause birth defects in the baby.  Take your prenatal vitamins.  Try taking a stool softener (if your health care provider approves) if you develop constipation. Eat more high-fiber foods, such as fresh vegetables or fruit and whole grains. Drink plenty of fluids to keep your urine clear or pale yellow.  Take warm sitz baths to soothe any pain or discomfort caused by hemorrhoids. Use hemorrhoid cream if your health care provider approves.  If you develop varicose veins, wear support hose. Elevate your feet for 15 minutes, 3-4 times a day. Limit salt in your diet.  Avoid heavy lifting, wear low heel shoes, and practice good posture.  Rest with your legs elevated if you have leg cramps or low back pain.  Visit your dentist if you have not gone yet during your pregnancy. Use a soft toothbrush to brush your teeth and be gentle when you floss.  A sexual relationship may be continued unless your health care provider directs you otherwise.  Continue to go to all your prenatal visits as directed by your health care provider. SEEK MEDICAL CARE IF:   You have dizziness.  You have mild pelvic cramps, pelvic pressure, or nagging pain in the abdominal area.  You have persistent nausea, vomiting, or diarrhea.  You have a bad smelling vaginal discharge.  You have pain with urination. SEEK IMMEDIATE MEDICAL CARE IF:   You have a fever.  You are leaking fluid from your vagina.  You have spotting or bleeding from your vagina.  You have severe abdominal cramping or pain.  You have rapid weight gain or loss.  You have shortness of breath with chest pain.  You notice sudden  or extreme swelling of your face, hands, ankles, feet, or legs.  You have not felt your baby move in over an hour.  You have severe headaches that do not go away with medicine.  You have vision changes. Document Released: 11/24/2001 Document Revised: 12/05/2013 Document Reviewed: 01/31/2013 [facility] Patient Information 2015 [place], [place]. This information is not intended to replace advice given to you by your health care provider. Make sure you discuss any questions you have with your health care provider.

## 2014-09-25 ENCOUNTER — Ambulatory Visit (INDEPENDENT_AMBULATORY_CARE_PROVIDER_SITE_OTHER): Payer: BC Managed Care – PPO | Admitting: Women's Health

## 2014-09-25 ENCOUNTER — Other Ambulatory Visit: Payer: BC Managed Care – PPO

## 2014-09-25 ENCOUNTER — Encounter: Payer: Self-pay | Admitting: Women's Health

## 2014-09-25 VITALS — BP 126/78 | Wt 206.0 lb

## 2014-09-25 DIAGNOSIS — Z331 Pregnant state, incidental: Secondary | ICD-10-CM

## 2014-09-25 DIAGNOSIS — Z131 Encounter for screening for diabetes mellitus: Secondary | ICD-10-CM

## 2014-09-25 DIAGNOSIS — Z23 Encounter for immunization: Secondary | ICD-10-CM

## 2014-09-25 DIAGNOSIS — Z113 Encounter for screening for infections with a predominantly sexual mode of transmission: Secondary | ICD-10-CM

## 2014-09-25 DIAGNOSIS — Z3482 Encounter for supervision of other normal pregnancy, second trimester: Secondary | ICD-10-CM

## 2014-09-25 DIAGNOSIS — Z114 Encounter for screening for human immunodeficiency virus [HIV]: Secondary | ICD-10-CM

## 2014-09-25 DIAGNOSIS — Z0184 Encounter for antibody response examination: Secondary | ICD-10-CM

## 2014-09-25 DIAGNOSIS — Z1389 Encounter for screening for other disorder: Secondary | ICD-10-CM

## 2014-09-25 LAB — POCT URINALYSIS DIPSTICK
Blood, UA: NEGATIVE
GLUCOSE UA: NEGATIVE
Ketones, UA: NEGATIVE
Nitrite, UA: NEGATIVE
Protein, UA: NEGATIVE

## 2014-09-25 LAB — CBC
HCT: 37.8 % (ref 36.0–46.0)
Hemoglobin: 13.1 g/dL (ref 12.0–15.0)
MCH: 30.5 pg (ref 26.0–34.0)
MCHC: 34.7 g/dL (ref 30.0–36.0)
MCV: 87.9 fL (ref 78.0–100.0)
Platelets: 321 10*3/uL (ref 150–400)
RBC: 4.3 MIL/uL (ref 3.87–5.11)
RDW: 14.6 % (ref 11.5–15.5)
WBC: 12.9 10*3/uL — ABNORMAL HIGH (ref 4.0–10.5)

## 2014-09-25 NOTE — Progress Notes (Signed)
Low-risk OB appointment G2P1001 [redacted]w[redacted]d Estimated Date of Delivery: 12/24/14 BP 126/78  Wt 206 lb (93.441 kg)  LMP 03/12/2014  BP, weight, and urine reviewed.  Refer to obstetrical flow sheet for FH & FHR.  Reports good fm.  Denies regular uc's, lof, vb, or uti s/s. Some pressure when standing for >4hrs at work, so is cutting back hours- also recommended pregnancy belt.  Reviewed ptl s/s, fkc. Recommended Tdap at HD/PCP per CDC guidelines.  Plan:  Continue routine obstetrical care  F/U in 3wks for OB appointment  PN2 today, Flu shot today

## 2014-09-25 NOTE — Patient Instructions (Signed)
Tdap Vaccine  It is recommended that you get the Tdap vaccine during the third trimester of EACH pregnancy to help protect your baby from getting pertussis (whooping cough)  27-36 weeks is the BEST time to do this so that you can pass the protection on to your baby. During pregnancy is better than after pregnancy, but if you are unable to get it during pregnancy it will be offered at the hospital.   You can get this vaccine at the health department or your family doctor  Everyone who will be around your baby should also be up-to-date on their vaccines. Adults (who are not pregnant) only need 1 dose of Tdap during adulthood.    Call the office [phone]) or go to [facility] if:  You begin to have strong, frequent contractions  Your water breaks.  Sometimes it is a big gush of fluid, sometimes it is just a trickle that keeps getting your panties wet or running down your legs  You have vaginal bleeding.  It is normal to have a small amount of spotting if your cervix was checked.   You don't feel your baby moving like normal.  If you don't, get you something to eat and drink and lay down and focus on feeling your baby move.  You should feel at least 10 movements in 2 hours.  If you don't, you should call the office or go to [place] [facility] Pregnancy The third trimester is from week 29 through week 42, months 7 through 9. The third trimester is a time when the fetus is growing rapidly. At the end of the ninth month, the fetus is about 20 inches in length and weighs 6-10 pounds.  BODY CHANGES Your body goes through many changes during pregnancy. The changes vary from woman to woman.   Your weight will continue to increase. You can expect to gain 25-35 pounds (11-16 kg) by the end of the pregnancy.  You may begin to get stretch marks on your hips, abdomen, and breasts.  You may urinate more often because the fetus is moving lower into your pelvis and pressing on  your bladder.  You may develop or continue to have heartburn as a result of your pregnancy.  You may develop constipation because certain hormones are causing the muscles that push waste through your intestines to slow down.  You may develop hemorrhoids or swollen, bulging veins (varicose veins).  You may have pelvic pain because of the weight gain and pregnancy hormones relaxing your joints between the bones in your pelvis. Backaches may result from overexertion of the muscles supporting your posture.  You may have changes in your hair. These can include thickening of your hair, rapid growth, and changes in texture. Some women also have hair loss during or after pregnancy, or hair that feels dry or thin. Your hair will most likely return to normal after your baby is born.  Your breasts will continue to grow and be tender. A yellow discharge may leak from your breasts called colostrum.  Your belly button may stick out.  You may feel short of breath because of your expanding uterus.  You may notice the fetus "dropping," or moving lower in your abdomen.  You may have a bloody mucus discharge. This usually occurs a few days to a week before labor begins.  Your cervix becomes thin and soft (effaced) near your due date. WHAT TO EXPECT AT YOUR PRENATAL EXAMS  You will have  prenatal exams every 2 weeks until week 36. Then, you will have weekly prenatal exams. During a routine prenatal visit:  You will be weighed to make sure you and the fetus are growing normally.  Your blood pressure is taken.  Your abdomen will be measured to track your baby's growth.  The fetal heartbeat will be listened to.  Any test results from the previous visit will be discussed.  You may have a cervical check near your due date to see if you have effaced. At around 36 weeks, your caregiver will check your cervix. At the same time, your caregiver will also perform a test on the secretions of the vaginal tissue.  This test is to determine if a type of bacteria, Group B streptococcus, is present. Your caregiver will explain this further. Your caregiver may ask you:  What your birth plan is.  How you are feeling.  If you are feeling the baby move.  If you have had any abnormal symptoms, such as leaking fluid, bleeding, severe headaches, or abdominal cramping.  If you have any questions. Other tests or screenings that may be performed during your third trimester include:  Blood tests that check for low iron levels (anemia).  Fetal testing to check the health, activity level, and growth of the fetus. Testing is done if you have certain medical conditions or if there are problems during the pregnancy. FALSE LABOR You may feel small, irregular contractions that eventually go away. These are called Braxton Hicks contractions, or false labor. Contractions may last for hours, days, or even weeks before true labor sets in. If contractions come at regular intervals, intensify, or become painful, it is best to be seen by your caregiver.  SIGNS OF LABOR   Menstrual-like cramps.  Contractions that are 5 minutes apart or less.  Contractions that start on the top of the uterus and spread down to the lower abdomen and back.  A sense of increased pelvic pressure or back pain.  A watery or bloody mucus discharge that comes from the vagina. If you have any of these signs before the 37th week of pregnancy, call your caregiver right away. You need to go to the hospital to get checked immediately. HOME CARE INSTRUCTIONS   Avoid all smoking, herbs, alcohol, and unprescribed drugs. These chemicals affect the formation and growth of the baby.  Follow your caregiver's instructions regarding medicine use. There are medicines that are either safe or unsafe to take during pregnancy.  Exercise only as directed by your caregiver. Experiencing uterine cramps is a good sign to stop exercising.  Continue to eat regular,  healthy meals.  Wear a good support bra for breast tenderness.  Do not use hot tubs, steam rooms, or saunas.  Wear your seat belt at all times when driving.  Avoid raw meat, uncooked cheese, cat litter boxes, and soil used by cats. These carry germs that can cause birth defects in the baby.  Take your prenatal vitamins.  Try taking a stool softener (if your caregiver approves) if you develop constipation. Eat more high-fiber foods, such as fresh vegetables or fruit and whole grains. Drink plenty of fluids to keep your urine clear or pale yellow.  Take warm sitz baths to soothe any pain or discomfort caused by hemorrhoids. Use hemorrhoid cream if your caregiver approves.  If you develop varicose veins, wear support hose. Elevate your feet for 15 minutes, 3-4 times a day. Limit salt in your diet.  Avoid heavy lifting, wear low  heal shoes, and practice good posture.  Rest a lot with your legs elevated if you have leg cramps or low back pain.  Visit your dentist if you have not gone during your pregnancy. Use a soft toothbrush to brush your teeth and be gentle when you floss.  A sexual relationship may be continued unless your caregiver directs you otherwise.  Do not travel far distances unless it is absolutely necessary and only with the approval of your caregiver.  Take prenatal classes to understand, practice, and ask questions about the labor and delivery.  Make a trial run to the hospital.  Pack your hospital bag.  Prepare the baby's nursery.  Continue to go to all your prenatal visits as directed by your caregiver. SEEK MEDICAL CARE IF:  You are unsure if you are in labor or if your water has broken.  You have dizziness.  You have mild pelvic cramps, pelvic pressure, or nagging pain in your abdominal area.  You have persistent nausea, vomiting, or diarrhea.  You have a bad smelling vaginal discharge.  You have pain with urination. SEEK IMMEDIATE MEDICAL CARE IF:    You have a fever.  You are leaking fluid from your vagina.  You have spotting or bleeding from your vagina.  You have severe abdominal cramping or pain.  You have rapid weight loss or gain.  You have shortness of breath with chest pain.  You notice sudden or extreme swelling of your face, hands, ankles, feet, or legs.  You have not felt your baby move in over an hour.  You have severe headaches that do not go away with medicine.  You have vision changes. Document Released: 11/24/2001 Document Revised: 12/05/2013 Document Reviewed: 01/31/2013 [facility] Patient Information 2015 [place], [place]. This information is not intended to replace advice given to you by your health care provider. Make sure you discuss any questions you have with your health care provider.

## 2014-09-26 ENCOUNTER — Encounter: Payer: Self-pay | Admitting: Women's Health

## 2014-09-26 LAB — RPR

## 2014-09-26 LAB — HSV 2 ANTIBODY, IGG

## 2014-09-26 LAB — GLUCOSE TOLERANCE, 2 HOURS W/ 1HR
Glucose, 1 hour: 128 mg/dL (ref 70–170)
Glucose, 2 hour: 127 mg/dL (ref 70–139)
Glucose, Fasting: 81 mg/dL (ref 70–99)

## 2014-09-26 LAB — HIV ANTIBODY (ROUTINE TESTING W REFLEX): HIV: NONREACTIVE

## 2014-09-26 LAB — ANTIBODY SCREEN: Antibody Screen: NEGATIVE

## 2014-10-03 ENCOUNTER — Encounter: Payer: BC Managed Care – PPO | Admitting: Internal Medicine

## 2014-10-15 ENCOUNTER — Encounter: Payer: Self-pay | Admitting: Women's Health

## 2014-10-16 ENCOUNTER — Encounter: Payer: Self-pay | Admitting: Advanced Practice Midwife

## 2014-10-16 ENCOUNTER — Ambulatory Visit (INDEPENDENT_AMBULATORY_CARE_PROVIDER_SITE_OTHER): Payer: BC Managed Care – PPO | Admitting: Advanced Practice Midwife

## 2014-10-16 VITALS — BP 120/80 | Wt 206.0 lb

## 2014-10-16 DIAGNOSIS — Z3483 Encounter for supervision of other normal pregnancy, third trimester: Secondary | ICD-10-CM

## 2014-10-16 DIAGNOSIS — O360931 Maternal care for other rhesus isoimmunization, third trimester, fetus 1: Secondary | ICD-10-CM

## 2014-10-16 DIAGNOSIS — Z331 Pregnant state, incidental: Secondary | ICD-10-CM

## 2014-10-16 DIAGNOSIS — O360131 Maternal care for anti-D [Rh] antibodies, third trimester, fetus 1: Secondary | ICD-10-CM

## 2014-10-16 DIAGNOSIS — Z1389 Encounter for screening for other disorder: Secondary | ICD-10-CM

## 2014-10-16 LAB — POCT URINALYSIS DIPSTICK
Blood, UA: NEGATIVE
GLUCOSE UA: NEGATIVE
KETONES UA: NEGATIVE
Nitrite, UA: NEGATIVE
Protein, UA: NEGATIVE

## 2014-10-16 MED ORDER — RHO D IMMUNE GLOBULIN 1500 UNIT/2ML IJ SOSY
300.0000 ug | PREFILLED_SYRINGE | Freq: Once | INTRAMUSCULAR | Status: AC
  Administered 2014-10-16: 300 ug via INTRAMUSCULAR

## 2014-10-16 NOTE — Progress Notes (Signed)
G2P1001 [redacted]w[redacted]d Estimated Date of Delivery: 12/24/14  Blood pressure 120/80, weight 206 lb (93.441 kg), last menstrual period 03/12/2014.   BP weight and urine results all reviewed and noted.  Please refer to the obstetrical flow sheet for the fundal height and fetal heart rate documentation: is not gaining weight because she eats a lot more healthy.   Patient reports good fetal movement, denies any bleeding and no rupture of membranes symptoms or regular contractions. Thinks she lost her mucus plug, but it wasn't bloody.  SSE:  Normal appearing discharge.  CX feels LTC, no presenting part.  Patient is without complaints. All questions were answered.  Plan:  Continued routine obstetrical care, baby asa  Follow up in 2 weeks for OB appointment,

## 2014-10-16 NOTE — Progress Notes (Signed)
Pt states that she thinks she lost her mucus plug. Pt denies any bleeding.

## 2014-10-18 ENCOUNTER — Inpatient Hospital Stay (HOSPITAL_COMMUNITY)
Admission: AD | Admit: 2014-10-18 | Discharge: 2014-10-18 | DRG: 778 | Disposition: A | Discharge status: Other Acute Inpatient Hospital | Payer: BC Managed Care – PPO | Source: Ambulatory Visit | Attending: Obstetrics and Gynecology | Admitting: Obstetrics and Gynecology

## 2014-10-18 ENCOUNTER — Encounter (HOSPITAL_COMMUNITY): Payer: Self-pay | Admitting: *Deleted

## 2014-10-18 DIAGNOSIS — O4703 False labor before 37 completed weeks of gestation, third trimester: Secondary | ICD-10-CM

## 2014-10-18 DIAGNOSIS — Z3A30 30 weeks gestation of pregnancy: Secondary | ICD-10-CM | POA: Diagnosis present

## 2014-10-18 DIAGNOSIS — Z8249 Family history of ischemic heart disease and other diseases of the circulatory system: Secondary | ICD-10-CM | POA: Diagnosis not present

## 2014-10-18 DIAGNOSIS — Y92512 Supermarket, store or market as the place of occurrence of the external cause: Secondary | ICD-10-CM

## 2014-10-18 DIAGNOSIS — Y990 Civilian activity done for income or pay: Secondary | ICD-10-CM | POA: Diagnosis not present

## 2014-10-18 DIAGNOSIS — M419 Scoliosis, unspecified: Secondary | ICD-10-CM | POA: Diagnosis present

## 2014-10-18 DIAGNOSIS — Z825 Family history of asthma and other chronic lower respiratory diseases: Secondary | ICD-10-CM

## 2014-10-18 DIAGNOSIS — Z8673 Personal history of transient ischemic attack (TIA), and cerebral infarction without residual deficits: Secondary | ICD-10-CM

## 2014-10-18 DIAGNOSIS — W010XXA Fall on same level from slipping, tripping and stumbling without subsequent striking against object, initial encounter: Secondary | ICD-10-CM | POA: Diagnosis present

## 2014-10-18 DIAGNOSIS — O99333 Smoking (tobacco) complicating pregnancy, third trimester: Secondary | ICD-10-CM | POA: Diagnosis present

## 2014-10-18 DIAGNOSIS — Z7982 Long term (current) use of aspirin: Secondary | ICD-10-CM | POA: Diagnosis not present

## 2014-10-18 DIAGNOSIS — J45909 Unspecified asthma, uncomplicated: Secondary | ICD-10-CM | POA: Diagnosis present

## 2014-10-18 LAB — KLEIHAUER-BETKE STAIN
# Vials RhIg: 1
FETAL CELLS %: 0 %
Quantitation Fetal Hemoglobin: 0 mL

## 2014-10-18 LAB — CBC
HCT: 36.8 % (ref 36.0–46.0)
Hemoglobin: 12.7 g/dL (ref 12.0–15.0)
MCH: 30.2 pg (ref 26.0–34.0)
MCHC: 34.5 g/dL (ref 30.0–36.0)
MCV: 87.6 fL (ref 78.0–100.0)
PLATELETS: 303 10*3/uL (ref 150–400)
RBC: 4.2 MIL/uL (ref 3.87–5.11)
RDW: 14.2 % (ref 11.5–15.5)
WBC: 15.1 10*3/uL — ABNORMAL HIGH (ref 4.0–10.5)

## 2014-10-18 MED ORDER — BETAMETHASONE SOD PHOS & ACET 6 (3-3) MG/ML IJ SUSP
12.0000 mg | INTRAMUSCULAR | Status: DC
  Administered 2014-10-18: 12 mg via INTRAMUSCULAR
  Filled 2014-10-18: qty 2

## 2014-10-18 MED ORDER — PRENATAL MULTIVITAMIN CH: 1.0000 | ORAL_TABLET | Freq: Every day | ORAL | Status: DC

## 2014-10-18 MED ORDER — ACETAMINOPHEN 325 MG PO TABS
650.0000 mg | ORAL_TABLET | ORAL | Status: DC | PRN
  Filled 2014-10-18: qty 2

## 2014-10-18 MED ORDER — NIFEDIPINE 10 MG PO CAPS: 10.0000 mg | ORAL_CAPSULE | Freq: Four times a day (QID) | ORAL | Status: DC

## 2014-10-18 MED ORDER — LACTATED RINGERS IV BOLUS (SEPSIS)
1000.0000 mL | Freq: Once | INTRAVENOUS | Status: AC
  Administered 2014-10-18: 1000 mL via INTRAVENOUS

## 2014-10-18 MED ORDER — DOCUSATE SODIUM 100 MG PO CAPS
100.0000 mg | ORAL_CAPSULE | Freq: Every day | ORAL | Status: DC
  Filled 2014-10-18 (×2): qty 1

## 2014-10-18 MED ORDER — ZOLPIDEM TARTRATE 5 MG PO TABS: 5.0000 mg | ORAL_TABLET | Freq: Every evening | ORAL | Status: DC | PRN

## 2014-10-18 MED ORDER — NIFEDIPINE 10 MG PO CAPS
20.0000 mg | ORAL_CAPSULE | Freq: Once | ORAL | Status: AC
  Administered 2014-10-18: 20 mg via ORAL
  Filled 2014-10-18: qty 2

## 2014-10-18 MED ORDER — CALCIUM CARBONATE ANTACID 500 MG PO CHEW: 2.0000 | CHEWABLE_TABLET | ORAL | Status: DC | PRN

## 2014-10-18 NOTE — Progress Notes (Signed)
Patient feels like her hand are swelling

## 2014-10-18 NOTE — MAU Note (Signed)
Patient states she tripped over a teter line for grocery carts and fell on her abdomen, hit her right knee and her head. Denies bleeding or leaking and reports feellng fetal movement. Patient states she has had a stroke that affects her right side with weakness.

## 2014-10-18 NOTE — H&P (Signed)
CC: Fall  HPI: Tina Mckee is a 23 y.o. female G2P1001 with IUP at [redacted]w[redacted]d presenting s/p fall approximately 3.5 hours prior to arrival in the MAU (fell around 1:15pm). Patient states that she was working at [employer] when she tripped over a tether line and fell flat on her abdomen and knees. Denies LOC or seizure like activity. Reports feeling pain in her hips and lower abdomen immediately afterwards. She thinks she may have started feeling contractions since her fall, but she is not sure. Patient reports that she works in [place], so it took her awhile to get to the MAU.   Denies any LOF or vaginal bleeding. Endorses good fetal movement.  Prenatal History/Complications: [facility]  FOB dustin Halter 23 yo wm 2nd  Dating By 6wk u/s  Pap 09/29/13 negative  GC/CT Initial:    -/-            36+wks:  Genetic Screen AFP: neg   CF screen Declined, pt known carrier  Dad negative  Anatomic [place] Normal female 'Emma'  Flu vaccine 09/25/14  Tdap Recommended ~ 28wks  Glucose Screen  2 hr normal: 81/128/127  GBS   Feed Preference breast  Contraception POPs  Circumcision Yes, if boy  Childbirth Classes declined  Pediatrician [facility]     Past Medical History: Past Medical History  Diagnosis Date  . Stroke birth    rt hemiparesis  Perinatal  . Scoliosis   . Perinatal arterial ischemic stroke   . Gestational hypertension   . Supervision of other normal pregnancy 05/04/2014  . Asthma     Past Surgical History: Past Surgical History  Procedure Laterality Date  . Foot surgery      rt foot  . Foot surgery      Bone placed    Obstetrical History: OB History    Gravida Para Term Preterm AB TAB SAB Ectopic Multiple Living   2 1 1  0      1      Social History: History   Social History  . Marital Status: Single    Spouse Name: N/A    Number of Children: N/A  . Years of Education: N/A   Social History Main Topics  . Smoking status: Former Smoker    Types:  Cigarettes    Quit date: 07/11/2007  . Smokeless tobacco: Never Used  . Alcohol Use: No     Comment: prior to pregnancy  . Drug Use: No  . Sexual Activity: Yes    Birth Control/ Protection: None   Other Topics Concern  . 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  5 days per week 2 days off .     Currently nonsmoking    HH of 3 no pets     had mild preeclampsia with vaginal childbirth.  2011   ocass etoh.    Exercise  Walking     Sleep ok    caffiene limited per day .                 Family History: Family History  Problem Relation Age of Onset  . Hypertension Mother   . Asthma Mother   . Hypertension Father   . Asthma Sister   . Cancer Maternal Grandfather     pancreatic  . Cancer Paternal Grandmother     cancer all over  .  Other Paternal Grandfather     brain damage    Allergies: Allergies  Allergen Reactions  . Cephalexin Rash    Prescriptions prior to admission  Medication Sig Dispense Refill Last Dose  . albuterol (PROVENTIL HFA;VENTOLIN HFA) 108 (90 BASE) MCG/ACT inhaler Inhale 2 puffs into the lungs every 4 (four) hours as needed for wheezing or shortness of breath. 1 Inhaler 2 Past Week at Unknown time  . aspirin EC 81 MG tablet Take 1 tablet (81 mg total) by mouth daily. 100 tablet prn 10/18/2014 at Unknown time  . Prenatal Vit-Fe Fumarate-FA (PRENATAL MULTIVITAMIN) TABS tablet Take 1 tablet by mouth daily at 12 noon.   10/18/2014 at Unknown time     Review of Systems-As per HPI, otherwise negative  Last menstrual period 03/12/2014. General appearance: alert, cooperative and no distress Lungs: clear to auscultation bilaterally Heart: regular rate and rhythm Abdomen: soft, non-tender; bowel sounds normal Extremities: Homans sign is negative, no sign of DVT DTR's wnl Presentation: unsure Fetal monitoringBaseline: 120 bpm, Variability: Good {> 6 bpm), Accelerations:  Reactive and Decelerations: Absent Uterine activity: Irregular Every 4-7 minutes Dilation: 1 Effacement (%): Thick Station: -3 Exam by:: Dr Lorelee New   Prenatal labs: ABO, Rh: A/NEG/-- (05/22 1135) Antibody: NEG (10/13 0859) Rubella:   RPR: NON REAC (10/13 0859)  HBsAg: NEGATIVE (05/22 1135)  HIV: NONREACTIVE (10/13 0859)  GBS:       No results found for this or any previous visit (from the past 24 hour(s)).  Assessment: Tina Mckee is a 23 y.o. G2P1001 at [redacted]w[redacted]d here with contractions s/p fall onto abdomen. FHT reassuring.   P: Will admit for 24 hour observation Will obtain CBC and Kleihauer-Betke stain (given Rh negative status) Will give LR bolus and procardia for tocolysis  Will administer betamethazone   Dimas Chyle 10/18/2014, 4:30 PM   OB fellow attestation:  I have seen and examined this patient; I agree with above documentation in the residet's note.   Tina Mckee is a 23 y.o. G2P1001 reporting to MAU after fall about 4 hours ago.  Has right hip and knee pain which are increased from baseline +FM, denies LOF, VB, vaginal discharge.  PE: LMP 03/12/2014 Gen: calm comfortable when not moving, NAD Resp: normal effort, no distress Abd: gravid  ROS, labs, PMH reviewed NST reactive, q2-3 contractions SVE: 1/thick/-3, moderate  Plan: Patient is 23 y.o. G2P1001 [redacted]w[redacted]d with hx of perinatal stroke with resulting right hemiparesis here with complaints of contractions after fall concerning for preterm labor with change in cervix as was closed 2 days ago  - admit for 24h observation, discussed with NICU and no beds available at this time and advise patient be transferred if able.   - BMZ, procardia, 1L IV now, continue to monitor contraction pattern, patient advised of risk of preterm labor. - Rh neg => KB stain, CBC - discussed with Dr. Elly Modena, will initiate transfer  Merla Riches, MD 4:43 PM

## 2014-10-18 NOTE — MAU Note (Signed)
Pt states she  Fell at 1315. Pt states she fell on her abdomen, while trying to catch herself on her knees.

## 2014-10-19 ENCOUNTER — Telehealth: Payer: Self-pay | Admitting: *Deleted

## 2014-10-31 ENCOUNTER — Ambulatory Visit (INDEPENDENT_AMBULATORY_CARE_PROVIDER_SITE_OTHER): Payer: BC Managed Care – PPO | Admitting: Women's Health

## 2014-10-31 ENCOUNTER — Encounter: Payer: Self-pay | Admitting: Women's Health

## 2014-10-31 VITALS — BP 110/80 | Wt 208.0 lb

## 2014-10-31 DIAGNOSIS — Z331 Pregnant state, incidental: Secondary | ICD-10-CM

## 2014-10-31 DIAGNOSIS — Z1389 Encounter for screening for other disorder: Secondary | ICD-10-CM

## 2014-10-31 DIAGNOSIS — Z3483 Encounter for supervision of other normal pregnancy, third trimester: Secondary | ICD-10-CM

## 2014-10-31 LAB — POCT URINALYSIS DIPSTICK
Blood, UA: NEGATIVE
Glucose, UA: NEGATIVE
Ketones, UA: NEGATIVE
LEUKOCYTES UA: NEGATIVE
Nitrite, UA: NEGATIVE
PROTEIN UA: NEGATIVE

## 2014-10-31 NOTE — Patient Instructions (Signed)
Call the office ([phone]) or go to [facility] if:  You begin to have strong, frequent contractions  Your water breaks.  Sometimes it is a big gush of fluid, sometimes it is just a trickle that keeps getting your panties wet or running down your legs  You have vaginal bleeding.  It is normal to have a small amount of spotting if your cervix was checked.   You don't feel your baby moving like normal.  If you don't, get you something to eat and drink and lay down and focus on feeling your baby move.  You should feel at least 10 movements in 2 hours.  If you don't, you should call the office or go to [facility].    Preterm Labor Information Preterm labor is when labor starts at less than 37 weeks of pregnancy. The normal length of a pregnancy is 39 to 41 weeks. CAUSES Often, there is no identifiable underlying cause as to why a woman goes into preterm labor. One of the most common known causes of preterm labor is infection. Infections of the uterus, cervix, vagina, amniotic sac, bladder, kidney, or even the lungs (pneumonia) can cause labor to start. Other suspected causes of preterm labor include:   Urogenital infections, such as yeast infections and bacterial vaginosis.   Uterine abnormalities (uterine shape, uterine septum, fibroids, or bleeding from the placenta).   A cervix that has been operated on (it may fail to stay closed).   Malformations in the fetus.   Multiple gestations (twins, triplets, and so on).   Breakage of the amniotic sac.  RISK FACTORS  Having a previous history of preterm labor.   Having premature rupture of membranes (PROM).   Having a placenta that covers the opening of the cervix (placenta previa).   Having a placenta that separates from the uterus (placental abruption).   Having a cervix that is too weak to hold the fetus in the uterus (incompetent cervix).   Having too much fluid in the amniotic sac (polyhydramnios).   Taking  illegal drugs or smoking while pregnant.   Not gaining enough weight while pregnant.   Being younger than 18 and older than 25 years old.   Having a low socioeconomic status.   Being African American. SYMPTOMS Signs and symptoms of preterm labor include:   Menstrual-like cramps, abdominal pain, or back pain.  Uterine contractions that are regular, as frequent as six in an hour, regardless of their intensity (may be mild or painful).  Contractions that start on the top of the uterus and spread down to the lower abdomen and back.   A sense of increased pelvic pressure.   A watery or bloody mucus discharge that comes from the vagina.  TREATMENT Depending on the length of the pregnancy and other circumstances, your health care provider may suggest bed rest. If necessary, there are medicines that can be given to stop contractions and to mature the fetal lungs. If labor happens before 34 weeks of pregnancy, a prolonged hospital stay may be recommended. Treatment depends on the condition of both you and the fetus.  WHAT SHOULD YOU DO IF YOU THINK YOU ARE IN PRETERM LABOR? Call your health care provider right away. You will need to go to the hospital to get checked immediately. HOW CAN YOU PREVENT PRETERM LABOR IN FUTURE PREGNANCIES? You should:   Stop smoking if you smoke.  Maintain healthy weight gain and avoid chemicals and drugs that are not necessary.  Be watchful for   any type of infection.  Inform your health care provider if you have a known history of preterm labor. Document Released: 02/20/2004 Document Revised: 08/02/2013 Document Reviewed: 01/02/2013 [employer] Patient Information 2015 [employer]. This information is not intended to replace advice given to you by your health care provider. Make sure you discuss any questions you have with your health care provider.  

## 2014-10-31 NOTE — Progress Notes (Signed)
Low-risk OB appointment G2P1001 [redacted]w[redacted]d Estimated Date of Delivery: 12/24/14 BP 110/80 mmHg  Wt 208 lb (94.348 kg)  LMP 03/12/2014  BP, weight, and urine reviewed.  Refer to obstetrical flow sheet for FH & FHR.  Reports good fm.  Denies regular uc's, lof, vb, or uti s/s. No complaints. Fell at work the other week, went to [employer] was tx to [employer] and nicu being full, had normal 24hr obs and uc's stopped.  Reviewed ptl s/s, fkc. Plan:  Continue routine obstetrical care  F/U in 2wks for OB appointment

## 2014-11-02 ENCOUNTER — Encounter (HOSPITAL_COMMUNITY): Payer: Self-pay | Admitting: *Deleted

## 2014-11-02 ENCOUNTER — Telehealth: Payer: Self-pay | Admitting: *Deleted

## 2014-11-02 ENCOUNTER — Inpatient Hospital Stay (HOSPITAL_COMMUNITY)
Admission: AD | Admit: 2014-11-02 | Discharge: 2014-11-02 | Disposition: A | Discharge status: Home / Self Care | Payer: BC Managed Care – PPO | Source: Ambulatory Visit | Attending: Family Medicine | Admitting: Family Medicine

## 2014-11-02 DIAGNOSIS — Z87891 Personal history of nicotine dependence: Secondary | ICD-10-CM | POA: Diagnosis not present

## 2014-11-02 DIAGNOSIS — O4703 False labor before 37 completed weeks of gestation, third trimester: Secondary | ICD-10-CM

## 2014-11-02 DIAGNOSIS — Z3A32 32 weeks gestation of pregnancy: Secondary | ICD-10-CM | POA: Diagnosis not present

## 2014-11-02 DIAGNOSIS — O479 False labor, unspecified: Secondary | ICD-10-CM

## 2014-11-02 LAB — URINALYSIS, ROUTINE W REFLEX MICROSCOPIC
Bilirubin Urine: NEGATIVE
GLUCOSE, UA: NEGATIVE mg/dL
KETONES UR: NEGATIVE mg/dL
NITRITE: NEGATIVE
Protein, ur: NEGATIVE mg/dL
SPECIFIC GRAVITY, URINE: 1.01 (ref 1.005–1.030)
Urobilinogen, UA: 0.2 mg/dL (ref 0.0–1.0)
pH: 6 (ref 5.0–8.0)

## 2014-11-02 LAB — URINE MICROSCOPIC-ADD ON

## 2014-11-02 MED ORDER — NIFEDIPINE 20 MG PO CAPS: 20.0000 mg | ORAL_CAPSULE | Freq: Three times a day (TID) | ORAL | Status: DC | PRN

## 2014-11-02 MED ORDER — NIFEDIPINE 10 MG PO CAPS: 20.0000 mg | ORAL_CAPSULE | Freq: Three times a day (TID) | ORAL | Status: DC | PRN

## 2014-11-02 NOTE — MAU Note (Signed)
Patient states she is having contractions every 5-8 minutes. Had a gush of fluid last night but none today. Reports good fetal movement.

## 2014-11-02 NOTE — Discharge Instructions (Signed)
Braxton Hicks Contractions °Contractions of the uterus can occur throughout pregnancy. Contractions are not always a sign that you are in labor.  °WHAT ARE BRAXTON HICKS CONTRACTIONS?  °Contractions that occur before labor are called Braxton Hicks contractions, or false labor. Toward the end of pregnancy (32-34 weeks), these contractions can develop more often and may become more forceful. This is not true labor because these contractions do not result in opening (dilatation) and thinning of the cervix. They are sometimes difficult to tell apart from true labor because these contractions can be forceful and people have different pain tolerances. You should not feel embarrassed if you go to the hospital with false labor. Sometimes, the only way to tell if you are in true labor is for your health care provider to look for changes in the cervix. °If there are no prenatal problems or other health problems associated with the pregnancy, it is completely safe to be sent home with false labor and await the onset of true labor. °HOW CAN YOU TELL THE DIFFERENCE BETWEEN TRUE AND FALSE LABOR? °False Labor °· The contractions of false labor are usually shorter and not as hard as those of true labor.   °· The contractions are usually irregular.   °· The contractions are often felt in the front of the lower abdomen and in the groin.   °· The contractions may go away when you walk around or change positions while lying down.   °· The contractions get weaker and are shorter lasting as time goes on.   °· The contractions do not usually become progressively stronger, regular, and closer together as with true labor.   °True Labor °· Contractions in true labor last 30-70 seconds, become very regular, usually become more intense, and increase in frequency.   °· The contractions do not go away with walking.   °· The discomfort is usually felt in the top of the uterus and spreads to the lower abdomen and low back.   °· True labor can be  determined by your health care provider with an exam. This will show that the cervix is dilating and getting thinner.   °WHAT TO REMEMBER °· Keep up with your usual exercises and follow other instructions given by your health care provider.   °· Take medicines as directed by your health care provider.   °· Keep your regular prenatal appointments.   °· Eat and drink lightly if you think you are going into labor.   °· If Braxton Hicks contractions are making you uncomfortable:   °¨ Change your position from lying down or resting to walking, or from walking to resting.   °¨ Sit and rest in a tub of warm water.   °¨ Drink 2-3 glasses of water. Dehydration may cause these contractions.   °¨ Do slow and deep breathing several times an hour.   °WHEN SHOULD I SEEK IMMEDIATE MEDICAL CARE? °Seek immediate medical care if: °· Your contractions become stronger, more regular, and closer together.   °· You have fluid leaking or gushing from your vagina.   °· You have a fever.   °· You pass blood-tinged mucus.   °· You have vaginal bleeding.   °· You have continuous abdominal pain.   °· You have low back pain that you never had before.   °· You feel your baby's head pushing down and causing pelvic pressure.   °· Your baby is not moving as much as it used to.   °Document Released: 11/30/2005 Document Revised: 12/05/2013 Document Reviewed: 09/11/2013 °[employer]® Patient Information ©2015 [employer]. This information is not intended to replace advice given to you by your health care   provider. Make sure you discuss any questions you have with your health care provider. ° °

## 2014-11-02 NOTE — MAU Note (Signed)
PT SAYS  SHE THINKS SROM AT 5-6PM- YESTERDAY   WHILE SHE WAS  IN CHAIR.  PT ON BEDREST- SINCE FELL -   TRIPPED OVER SOMETHING-   FELL ON ABD .    NOW  SHE DOES NOT FEEL FLUID COMING OUT OF VAG.     STARTED FEELING UC  TODAY  AT 12 NOON-  THEN AT 130PM-  STRONGER.     PNC-   [facility]-   SEEN LAST ON WED.   NEXT APPOINTMENT  -  12-2.      DENIES HSV AND MRSA.      PT SAYS HAD STROKE  WHEN BORN-  HAS PARTIAL PARALYSIS  ON RIGHT  SIDE- FOOT DRAGS.

## 2014-11-02 NOTE — MAU Provider Note (Signed)
History     CSN: [id number]  Arrival date and time: 11/02/14 1421   None     Chief Complaint  Patient presents with  . Labor Eval   HPI Tina Mckee is a 23yo G2P1001 @ 32.4wks presenting for eval of contractions. She denies leaking or bldg. Reports +FM. Her preg has been followed by the [facility] service and has been remarkable for 1) perinatal stroke resulting in hemiparesis 2) RH neg 3) rubella non immune 4) hx preeclampsia 5) obesity  OB History    Gravida Para Term Preterm AB TAB SAB Ectopic Multiple Living   2 1 1  0      1      Past Medical History  Diagnosis Date  . Stroke birth    rt hemiparesis  Perinatal  . Scoliosis   . Perinatal arterial ischemic stroke   . Gestational hypertension   . Supervision of other normal pregnancy 05/04/2014  . Asthma     Past Surgical History  Procedure Laterality Date  . Foot surgery      rt foot  . Foot surgery      Bone placed    Family History  Problem Relation Age of Onset  . Hypertension Mother   . Asthma Mother   . Hypertension Father   . Asthma Sister   . Cancer Maternal Grandfather     pancreatic  . Cancer Paternal Grandmother     cancer all over  . Other Paternal Grandfather     brain damage    History  Substance Use Topics  . Smoking status: Former Smoker    Types: Cigarettes    Quit date: 07/11/2007  . Smokeless tobacco: Never Used  . Alcohol Use: No     Comment: prior to pregnancy    Allergies:  Allergies  Allergen Reactions  . Cephalexin Rash    Prescriptions prior to admission  Medication Sig Dispense Refill Last Dose  . albuterol (PROVENTIL HFA;VENTOLIN HFA) 108 (90 BASE) MCG/ACT inhaler Inhale 2 puffs into the lungs every 4 (four) hours as needed for wheezing or shortness of breath. 1 Inhaler 2 Past Week at Unknown time  . aspirin EC 81 MG tablet Take 1 tablet (81 mg total) by mouth daily. 100 tablet prn 11/02/2014 at Unknown time  . Prenatal Vit-Fe Fumarate-FA (PRENATAL MULTIVITAMIN)  TABS tablet Take 1 tablet by mouth daily at 12 noon.   11/02/2014 at Unknown time    ROS Physical Exam   Blood pressure 138/82, pulse 111, temperature 97.9 F (36.6 C), temperature source Oral, resp. rate 20, height 5\' 2"  (1.575 m), weight 93.895 kg (207 lb), last menstrual period 03/12/2014, SpO2 100 %.  Physical Exam  Constitutional: She is oriented to person, place, and time. She appears well-developed.  HENT:  Head: Normocephalic.  Neck: Normal range of motion.  Cardiovascular: Normal rate.   Respiratory: Effort normal.  GI:  EFM 140s, +accels, no decels Irreg ctx; no pattern  Genitourinary: Vagina normal.  Cx closed/long; white vag d/c present  Neurological: She is alert and oriented to person, place, and time.  Skin: Skin is warm and dry.  Psychiatric: She has a normal mood and affect. Her behavior is normal. Thought content normal.   Urinalysis    Component Value Date/Time   COLORURINE YELLOW 11/02/2014 1445   APPEARANCEUR HAZY* 11/02/2014 1445   LABSPEC 1.010 11/02/2014 1445   PHURINE 6.0 11/02/2014 1445   GLUCOSEU NEGATIVE 11/02/2014 1445   HGBUR TRACE* 11/02/2014 1445   BILIRUBINUR  NEGATIVE 11/02/2014 1445   BILIRUBINUR Negative 11/19/2013   BILIRUBINUR small 11/17/2013 1622   BILIRUBINUR negative 04/22/2012 1005   BILIRUBINUR negative 04/22/2012 1005   KETONESUR NEGATIVE 11/02/2014 1445   PROTEINUR NEGATIVE 11/02/2014 1445   PROTEINUR neg 10/31/2014 1109   UROBILINOGEN 0.2 11/02/2014 1445   UROBILINOGEN 2.0 11/17/2013 1622   NITRITE NEGATIVE 11/02/2014 1445   NITRITE neg 10/31/2014 1109   NITRITE Negative 11/19/2013   NITRITE Negative 04/22/2012 1005   LEUKOCYTESUR LARGE* 11/02/2014 1445   LEUKOCYTESUR Negative 11/19/2013   ** leuks prob from vag d/c   MAU Course  Procedures    Assessment and Plan  IUP @ 32.4wks Braxton hicks ctx  D/C home with preterm labor precautions Offered Procardia prn due to discomfort of irreg ctx- pt agreeable- given  script to take prn over the next 2-3 weeks F/U as scheduled at [facility] [place] 11/02/2014, 4:35 PM

## 2014-11-02 NOTE — Telephone Encounter (Signed)
Pt states having contractions 6 minutes apart for the last 45 minutes, +FM, no gush of fluids. Pt informed to have someone drive her to [facility] for evaluation. Pt verbalized understanding.

## 2014-11-14 ENCOUNTER — Encounter: Payer: Self-pay | Admitting: Women's Health

## 2014-11-14 ENCOUNTER — Ambulatory Visit (INDEPENDENT_AMBULATORY_CARE_PROVIDER_SITE_OTHER): Payer: BC Managed Care – PPO | Admitting: Women's Health

## 2014-11-14 VITALS — BP 112/80 | Wt 209.0 lb

## 2014-11-14 DIAGNOSIS — Z3483 Encounter for supervision of other normal pregnancy, third trimester: Secondary | ICD-10-CM

## 2014-11-14 DIAGNOSIS — O360131 Maternal care for anti-D [Rh] antibodies, third trimester, fetus 1: Secondary | ICD-10-CM

## 2014-11-14 DIAGNOSIS — O09899 Supervision of other high risk pregnancies, unspecified trimester: Secondary | ICD-10-CM | POA: Insufficient documentation

## 2014-11-14 DIAGNOSIS — Z1389 Encounter for screening for other disorder: Secondary | ICD-10-CM

## 2014-11-14 DIAGNOSIS — O09893 Supervision of other high risk pregnancies, third trimester: Secondary | ICD-10-CM

## 2014-11-14 DIAGNOSIS — Z141 Cystic 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: Secondary | ICD-10-CM

## 2014-11-14 DIAGNOSIS — Z331 Pregnant state, incidental: Secondary | ICD-10-CM

## 2014-11-14 LAB — POCT URINALYSIS DIPSTICK
Glucose, UA: NEGATIVE
Ketones, UA: NEGATIVE
NITRITE UA: NEGATIVE
PROTEIN UA: NEGATIVE
RBC UA: NEGATIVE

## 2014-11-14 NOTE — Patient Instructions (Signed)
Call the office ([phone]) or go to [facility] if:  You begin to have strong, frequent contractions  Your water breaks.  Sometimes it is a big gush of fluid, sometimes it is just a trickle that keeps getting your panties wet or running down your legs  You have vaginal bleeding.  It is normal to have a small amount of spotting if your cervix was checked.   You don't feel your baby moving like normal.  If you don't, get you something to eat and drink and lay down and focus on feeling your baby move.  You should feel at least 10 movements in 2 hours.  If you don't, you should call the office or go to [facility].    Preterm Labor Information Preterm labor is when labor starts at less than 37 weeks of pregnancy. The normal length of a pregnancy is 39 to 41 weeks. CAUSES Often, there is no identifiable underlying cause as to why a woman goes into preterm labor. One of the most common known causes of preterm labor is infection. Infections of the uterus, cervix, vagina, amniotic sac, bladder, kidney, or even the lungs (pneumonia) can cause labor to start. Other suspected causes of preterm labor include:   Urogenital infections, such as yeast infections and bacterial vaginosis.   Uterine abnormalities (uterine shape, uterine septum, fibroids, or bleeding from the placenta).   A cervix that has been operated on (it may fail to stay closed).   Malformations in the fetus.   Multiple gestations (twins, triplets, and so on).   Breakage of the amniotic sac.  RISK FACTORS  Having a previous history of preterm labor.   Having premature rupture of membranes (PROM).   Having a placenta that covers the opening of the cervix (placenta previa).   Having a placenta that separates from the uterus (placental abruption).   Having a cervix that is too weak to hold the fetus in the uterus (incompetent cervix).   Having too much fluid in the amniotic sac (polyhydramnios).   Taking  illegal drugs or smoking while pregnant.   Not gaining enough weight while pregnant.   Being younger than 18 and older than 25 years old.   Having a low socioeconomic status.   Being African American. SYMPTOMS Signs and symptoms of preterm labor include:   Menstrual-like cramps, abdominal pain, or back pain.  Uterine contractions that are regular, as frequent as six in an hour, regardless of their intensity (may be mild or painful).  Contractions that start on the top of the uterus and spread down to the lower abdomen and back.   A sense of increased pelvic pressure.   A watery or bloody mucus discharge that comes from the vagina.  TREATMENT Depending on the length of the pregnancy and other circumstances, your health care provider may suggest bed rest. If necessary, there are medicines that can be given to stop contractions and to mature the fetal lungs. If labor happens before 34 weeks of pregnancy, a prolonged hospital stay may be recommended. Treatment depends on the condition of both you and the fetus.  WHAT SHOULD YOU DO IF YOU THINK YOU ARE IN PRETERM LABOR? Call your health care provider right away. You will need to go to the hospital to get checked immediately. HOW CAN YOU PREVENT PRETERM LABOR IN FUTURE PREGNANCIES? You should:   Stop smoking if you smoke.  Maintain healthy weight gain and avoid chemicals and drugs that are not necessary.  Be watchful for   any type of infection.  Inform your health care provider if you have a known history of preterm labor. Document Released: 02/20/2004 Document Revised: 08/02/2013 Document Reviewed: 01/02/2013 [employer] Patient Information 2015 [employer]. This information is not intended to replace advice given to you by your health care provider. Make sure you discuss any questions you have with your health care provider.  

## 2014-11-14 NOTE — Progress Notes (Signed)
Low-risk OB appointment G2P1001 [redacted]w[redacted]d Estimated Date of Delivery: 12/24/14 BP 112/80 mmHg  Wt 209 lb (94.802 kg)  LMP 03/12/2014  BP, weight, and urine reviewed.  Refer to obstetrical flow sheet for FH & FHR.  Reports good fm.  Denies lof, vb, or uti s/s. Has been having regular uc's- went to [facility]- [facility] were [facility].  Reviewed ptl s/s, fkc. To increase fluids, rest.  Plan:  Continue routine obstetrical care  F/U in 2wks for OB appointment and gbs

## 2014-11-28 ENCOUNTER — Ambulatory Visit (INDEPENDENT_AMBULATORY_CARE_PROVIDER_SITE_OTHER): Payer: BC Managed Care – PPO | Admitting: Obstetrics and Gynecology

## 2014-11-28 ENCOUNTER — Encounter: Payer: Self-pay | Admitting: Obstetrics and Gynecology

## 2014-11-28 VITALS — BP 130/90 | Wt 208.0 lb

## 2014-11-28 DIAGNOSIS — Z1389 Encounter for screening for other disorder: Secondary | ICD-10-CM

## 2014-11-28 DIAGNOSIS — N390 Urinary tract infection, site not specified: Secondary | ICD-10-CM

## 2014-11-28 DIAGNOSIS — Z331 Pregnant state, incidental: Secondary | ICD-10-CM

## 2014-11-28 LAB — POCT URINALYSIS DIPSTICK
Glucose, UA: NEGATIVE
KETONES UA: NEGATIVE
RBC UA: NEGATIVE

## 2014-11-28 MED ORDER — NITROFURANTOIN MONOHYD MACRO 100 MG PO CAPS: 100.0000 mg | ORAL_CAPSULE | Freq: Two times a day (BID) | ORAL | Status: DC

## 2014-11-28 NOTE — Progress Notes (Signed)
G2P1001 [redacted]w[redacted]d Estimated Date of Delivery: 12/24/14  Blood pressure 130/90, weight 208 lb (94.348 kg), last menstrual period 03/12/2014.   refer to the ob flow sheet for FH and FHR, also BP, Wt, Urine results:notable for positive for Nitrite, moderate 2+ Leukocytes, and trace of protein, otherwise negative  Patient reports good fetal movement, denies any bleeding and no rupture of membranes symptoms or regular contractions. Patient complaints: she reports that she has not noticed any symptoms of a bladder infection. she denies any other symptoms. She had a vaginal delivery for her previous birth.  Fetal Heart Rate: 146 Fundal Height: 38 cm   Questions were answered. Assessment:  Plan:  Continued routine obstetrical care.  Rx for bladder infection Macrobid  F/u in 1 weeks for routine OB  This chart was scribed for Jonnie Kind, MD by Steva Colder, ED Scribe. The patient was seen in room 3 at 11:49 AM.

## 2014-11-28 NOTE — Progress Notes (Signed)
Pt states that she has an area under her right arm.

## 2014-11-29 LAB — URINALYSIS
BILIRUBIN URINE: NEGATIVE
GLUCOSE, UA: NEGATIVE mg/dL
HGB URINE DIPSTICK: NEGATIVE
Ketones, ur: NEGATIVE mg/dL
Nitrite: POSITIVE — AB
Protein, ur: NEGATIVE mg/dL
Specific Gravity, Urine: 1.025 (ref 1.005–1.030)
Urobilinogen, UA: 1 mg/dL (ref 0.0–1.0)
pH: 6 (ref 5.0–8.0)

## 2014-11-30 LAB — URINE CULTURE
Colony Count: NO GROWTH
Organism ID, Bacteria: NO GROWTH

## 2014-12-05 ENCOUNTER — Ambulatory Visit (INDEPENDENT_AMBULATORY_CARE_PROVIDER_SITE_OTHER): Payer: BC Managed Care – PPO | Admitting: Advanced Practice Midwife

## 2014-12-05 ENCOUNTER — Encounter: Payer: BC Managed Care – PPO | Admitting: Obstetrics and Gynecology

## 2014-12-05 VITALS — BP 140/90 | Wt 209.5 lb

## 2014-12-05 DIAGNOSIS — Z1159 Encounter for screening for other viral diseases: Secondary | ICD-10-CM

## 2014-12-05 DIAGNOSIS — O133 Gestational [pregnancy-induced] hypertension without significant proteinuria, third trimester: Secondary | ICD-10-CM

## 2014-12-05 DIAGNOSIS — Z331 Pregnant state, incidental: Secondary | ICD-10-CM

## 2014-12-05 DIAGNOSIS — Z118 Encounter for screening for other infectious and parasitic diseases: Secondary | ICD-10-CM

## 2014-12-05 DIAGNOSIS — Z3685 Encounter for antenatal screening for Streptococcus B: Secondary | ICD-10-CM

## 2014-12-05 DIAGNOSIS — Z1389 Encounter for screening for other disorder: Secondary | ICD-10-CM

## 2014-12-05 DIAGNOSIS — Z3483 Encounter for supervision of other normal pregnancy, third trimester: Secondary | ICD-10-CM

## 2014-12-05 LAB — CBC
HCT: 41.3 % (ref 36.0–46.0)
HEMOGLOBIN: 13.9 g/dL (ref 12.0–15.0)
MCH: 30 pg (ref 26.0–34.0)
MCHC: 33.7 g/dL (ref 30.0–36.0)
MCV: 89.2 fL (ref 78.0–100.0)
MPV: 9.8 fL (ref 9.4–12.4)
Platelets: 337 10*3/uL (ref 150–400)
RBC: 4.63 MIL/uL (ref 3.87–5.11)
RDW: 14.6 % (ref 11.5–15.5)
WBC: 13.9 10*3/uL — ABNORMAL HIGH (ref 4.0–10.5)

## 2014-12-05 LAB — POCT URINALYSIS DIPSTICK
GLUCOSE UA: NEGATIVE
KETONES UA: NEGATIVE
Nitrite, UA: NEGATIVE
Protein, UA: 1

## 2014-12-05 LAB — COMPREHENSIVE METABOLIC PANEL
ALT: 12 U/L (ref 0–35)
AST: 16 U/L (ref 0–37)
Albumin: 3.3 g/dL — ABNORMAL LOW (ref 3.5–5.2)
Alkaline Phosphatase: 116 U/L (ref 39–117)
BUN: 11 mg/dL (ref 6–23)
CALCIUM: 9.2 mg/dL (ref 8.4–10.5)
CHLORIDE: 104 meq/L (ref 96–112)
CO2: 20 meq/L (ref 19–32)
Creat: 0.43 mg/dL — ABNORMAL LOW (ref 0.50–1.10)
Glucose, Bld: 75 mg/dL (ref 70–99)
Potassium: 4.1 mEq/L (ref 3.5–5.3)
Sodium: 138 mEq/L (ref 135–145)
Total Bilirubin: 1.1 mg/dL (ref 0.2–1.2)
Total Protein: 5.7 g/dL — ABNORMAL LOW (ref 6.0–8.3)

## 2014-12-05 LAB — OB RESULTS CONSOLE GC/CHLAMYDIA
CHLAMYDIA, DNA PROBE: NEGATIVE
Gonorrhea: NEGATIVE

## 2014-12-05 NOTE — Progress Notes (Signed)
G2P1001 [redacted]w[redacted]d Estimated Date of Delivery: 12/24/14  Blood pressure 140/90, weight 209 lb 8 oz (95.029 kg), last menstrual period 03/12/2014.   BP weight and urine results all reviewed and noted.  Please refer to the obstetrical flow sheet for the fundal height and fetal heart rate documentation:  Patient reports good fetal movement, denies any bleeding and no rupture of membranes symptoms or regular contractions. Patient is without complaints.  Denies HA, RUQ pain, vision changes.  DTR 1+. All questions were answered.  Plan: F/U tomorrow for BP check; PreX labs, GBS today

## 2014-12-06 ENCOUNTER — Ambulatory Visit (INDEPENDENT_AMBULATORY_CARE_PROVIDER_SITE_OTHER): Payer: BC Managed Care – PPO | Admitting: Obstetrics & Gynecology

## 2014-12-06 ENCOUNTER — Encounter: Payer: Self-pay | Admitting: Obstetrics & Gynecology

## 2014-12-06 VITALS — BP 136/84 | Wt 211.0 lb

## 2014-12-06 DIAGNOSIS — Z1389 Encounter for screening for other disorder: Secondary | ICD-10-CM

## 2014-12-06 DIAGNOSIS — Z331 Pregnant state, incidental: Secondary | ICD-10-CM

## 2014-12-06 DIAGNOSIS — Z3483 Encounter for supervision of other normal pregnancy, third trimester: Secondary | ICD-10-CM

## 2014-12-06 LAB — GC/CHLAMYDIA PROBE AMP
CT Probe RNA: NEGATIVE
GC Probe RNA: NEGATIVE

## 2014-12-06 LAB — POCT URINALYSIS DIPSTICK
Blood, UA: NEGATIVE
Glucose, UA: NEGATIVE
Ketones, UA: NEGATIVE
Nitrite, UA: NEGATIVE

## 2014-12-06 LAB — PROTEIN / CREATININE RATIO, URINE
CREATININE, URINE: 147.1 mg/dL
Protein Creatinine Ratio: 0.21 — ABNORMAL HIGH (ref <0.15)
Total Protein, Urine: 31 mg/dL — ABNORMAL HIGH (ref 5–24)

## 2014-12-06 LAB — STREP B DNA PROBE: STREP GROUP B AG: NOT DETECTED

## 2014-12-06 NOTE — Progress Notes (Signed)
All labs are normal, reviewed  G2P1001 [redacted]w[redacted]d Estimated Date of Delivery: 12/24/14  Blood pressure 136/84, weight 211 lb (95.709 kg), last menstrual period 03/12/2014.   BP weight and urine results all reviewed and noted.  Please refer to the obstetrical flow sheet for the fundal height and fetal heart rate documentation:  Patient reports good fetal movement, denies any bleeding and no rupture of membranes symptoms or regular contractions. Patient is without complaints. All questions were answered.  Plan:  Continued routine obstetrical care,   Follow up in 1 weeks for OB appointment, routine

## 2014-12-10 ENCOUNTER — Inpatient Hospital Stay (HOSPITAL_COMMUNITY)
Admission: AD | Admit: 2014-12-10 | Discharge: 2014-12-11 | Disposition: A | Discharge status: Home / Self Care | Payer: BLUE CROSS/BLUE SHIELD | Source: Ambulatory Visit | Attending: Obstetrics & Gynecology | Admitting: Obstetrics & Gynecology

## 2014-12-10 ENCOUNTER — Encounter (HOSPITAL_COMMUNITY): Payer: Self-pay | Admitting: *Deleted

## 2014-12-10 DIAGNOSIS — Z8673 Personal history of transient ischemic attack (TIA), and cerebral infarction without residual deficits: Secondary | ICD-10-CM | POA: Diagnosis not present

## 2014-12-10 DIAGNOSIS — O471 False labor at or after 37 completed weeks of gestation: Secondary | ICD-10-CM

## 2014-12-10 DIAGNOSIS — O09299 Supervision of pregnancy with other poor reproductive or obstetric history, unspecified trimester: Secondary | ICD-10-CM

## 2014-12-10 DIAGNOSIS — Z87891 Personal history of nicotine dependence: Secondary | ICD-10-CM | POA: Insufficient documentation

## 2014-12-10 DIAGNOSIS — Z141 Cystic 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: Secondary | ICD-10-CM

## 2014-12-10 DIAGNOSIS — O133 Gestational [pregnancy-induced] hypertension without significant proteinuria, third trimester: Secondary | ICD-10-CM | POA: Diagnosis not present

## 2014-12-10 DIAGNOSIS — Z3A38 38 weeks gestation of pregnancy: Secondary | ICD-10-CM | POA: Diagnosis not present

## 2014-12-10 DIAGNOSIS — O09893 Supervision of other high risk pregnancies, third trimester: Secondary | ICD-10-CM

## 2014-12-10 LAB — COMPREHENSIVE METABOLIC PANEL
ALK PHOS: 106 U/L (ref 39–117)
ALT: 13 U/L (ref 0–35)
ANION GAP: 10 (ref 5–15)
AST: 17 U/L (ref 0–37)
Albumin: 2.9 g/dL — ABNORMAL LOW (ref 3.5–5.2)
BILIRUBIN TOTAL: 1.2 mg/dL (ref 0.3–1.2)
BUN: 12 mg/dL (ref 6–23)
CHLORIDE: 106 meq/L (ref 96–112)
CO2: 21 mmol/L (ref 19–32)
Calcium: 9 mg/dL (ref 8.4–10.5)
Creatinine, Ser: 0.37 mg/dL — ABNORMAL LOW (ref 0.50–1.10)
GFR calc Af Amer: >90 mL/min (ref >90)
GFR calc non Af Amer: >90 mL/min (ref >90)
Glucose, Bld: 81 mg/dL (ref 70–99)
POTASSIUM: 3.5 mmol/L (ref 3.5–5.1)
Sodium: 137 mmol/L (ref 135–145)
Total Protein: 6 g/dL (ref 6.0–8.3)

## 2014-12-10 LAB — CBC
HEMATOCRIT: 38.7 % (ref 36.0–46.0)
Hemoglobin: 13.3 g/dL (ref 12.0–15.0)
MCH: 29.9 pg (ref 26.0–34.0)
MCHC: 34.4 g/dL (ref 30.0–36.0)
MCV: 87 fL (ref 78.0–100.0)
Platelets: 309 10*3/uL (ref 150–400)
RBC: 4.45 MIL/uL (ref 3.87–5.11)
RDW: 14.9 % (ref 11.5–15.5)
WBC: 15.3 10*3/uL — AB (ref 4.0–10.5)

## 2014-12-10 LAB — PROTEIN / CREATININE RATIO, URINE
Creatinine, Urine: 145 mg/dL
Protein Creatinine Ratio: 0.21 — ABNORMAL HIGH (ref 0.00–0.15)
Total Protein, Urine: 31 mg/dL

## 2014-12-10 NOTE — MAU Note (Signed)
Pt states that she started having contractions at 2am this morning. Having pressure in pelvic area. Denies vaginal bleeding. Positive fetal movement. Denies SROM/LOF. Denies MRSA or STD's.

## 2014-12-10 NOTE — Discharge Instructions (Signed)

## 2014-12-10 NOTE — MAU Provider Note (Signed)
History     CSN: [id number]  Arrival date and time: 12/10/14 1953   None     Chief Complaint  Patient presents with  . Labor Eval   HPI This is a 23 y.o. female at [redacted]w[redacted]d who presents for labor evaluation. She was found to be hypertensive, so labs were ordered. Has a history of preeclampsia. States has some mild headaches, none now. No visual disturbances. Mild edema.  RN Note:  Expand All Collapse All   Pt states that she started having contractions at 2am this morning. Having pressure in pelvic area. Denies vaginal bleeding. Positive fetal movement. Denies SROM/LOF. Denies MRSA or STD's.           OB History    Gravida Para Term Preterm AB TAB SAB Ectopic Multiple Living   2 1 1  0      1      Past Medical History  Diagnosis Date  . Stroke birth    rt hemiparesis  Perinatal  . Scoliosis   . Perinatal arterial ischemic stroke   . Gestational hypertension   . Supervision of other normal pregnancy 05/04/2014  . Asthma     Past Surgical History  Procedure Laterality Date  . Foot surgery      rt foot  . Foot surgery      Bone placed    Family History  Problem Relation Age of Onset  . Hypertension Mother   . Asthma Mother   . Hypertension Father   . Asthma Sister   . Cancer Maternal Grandfather     pancreatic  . Cancer Paternal Grandmother     cancer all over  . Other Paternal Grandfather     brain damage    History  Substance Use Topics  . Smoking status: Former Smoker    Types: Cigarettes    Quit date: 07/11/2007  . Smokeless tobacco: Never Used  . Alcohol Use: No     Comment: prior to pregnancy    Allergies:  Allergies  Allergen Reactions  . Cephalexin Rash    Prescriptions prior to admission  Medication Sig Dispense Refill Last Dose  . albuterol (PROVENTIL HFA;VENTOLIN HFA) 108 (90 BASE) MCG/ACT inhaler Inhale 2 puffs into the lungs every 4 (four) hours as needed for wheezing or shortness of breath. 1 Inhaler 2 Past Month at Unknown  time  . aspirin EC 81 MG tablet Take 1 tablet (81 mg total) by mouth daily. 100 tablet prn 12/10/2014 at Unknown time  . Prenatal Vit-Fe Fumarate-FA (PRENATAL MULTIVITAMIN) TABS tablet Take 1 tablet by mouth daily at 12 noon.   12/10/2014 at Unknown time    Review of Systems  Constitutional: Negative for fever, chills and malaise/fatigue.  Eyes: Negative for blurred vision and double vision.  Gastrointestinal: Positive for abdominal pain. Negative for nausea, vomiting, diarrhea and constipation.  Neurological: Positive for headaches. Negative for speech change and focal weakness.   Physical Exam   Blood pressure 133/89, pulse 96, temperature 98.4 F (36.9 C), temperature source Oral, resp. rate 20, height 5\' 1"  (1.549 m), weight 210 lb 6.4 oz (95.437 kg), last menstrual period 03/12/2014.  Filed Vitals:   12/10/14 2201 12/10/14 2215 12/10/14 2230 12/10/14 2245  BP: 146/97 142/99 134/102 133/89  Pulse: 89 91 93 96  Temp:      TempSrc:      Resp:      Height:      Weight:        Physical Exam  Constitutional: She  is oriented to person, place, and time. She appears well-developed and well-nourished. No distress.  HENT:  Head: Normocephalic.  Cardiovascular: Normal rate.   Respiratory: Effort normal.  GI: Soft. She exhibits no distension. There is no tenderness. There is no rebound and no guarding.  Genitourinary: Vagina normal. No vaginal discharge found.  Dilation: 2 Effacement (%): Thick Cervical Position: Posterior Station: -3 Presentation: Vertex Exam by:: Jeannene Patella RN    Musculoskeletal: Normal range of motion. She exhibits edema (trace pedal).  Neurological: She is alert and oriented to person, place, and time.  Skin: Skin is warm and dry.  Psychiatric: She has a normal mood and affect.   FHR reactive UCs irregular and mild, every 4-8 min  MAU Course  Procedures  MDM Results for orders placed or performed during the hospital encounter of 12/10/14 (from the past  24 hour(s))  CBC     Status: Abnormal   Collection Time: 12/10/14  9:05 PM  Result Value Ref Range   WBC 15.3 (H) 4.0 - 10.5 K/uL   RBC 4.45 3.87 - 5.11 MIL/uL   Hemoglobin 13.3 12.0 - 15.0 g/dL   HCT 38.7 36.0 - 46.0 %   MCV 87.0 78.0 - 100.0 fL   MCH 29.9 26.0 - 34.0 pg   MCHC 34.4 30.0 - 36.0 g/dL   RDW 14.9 11.5 - 15.5 %   Platelets 309 150 - 400 K/uL  Comprehensive metabolic panel     Status: Abnormal   Collection Time: 12/10/14  9:05 PM  Result Value Ref Range   Sodium 137 135 - 145 mmol/L   Potassium 3.5 3.5 - 5.1 mmol/L   Chloride 106 96 - 112 mEq/L   CO2 21 19 - 32 mmol/L   Glucose, Bld 81 70 - 99 mg/dL   BUN 12 6 - 23 mg/dL   Creatinine, Ser 0.37 (L) 0.50 - 1.10 mg/dL   Calcium 9.0 8.4 - 10.5 mg/dL   Total Protein 6.0 6.0 - 8.3 g/dL   Albumin 2.9 (L) 3.5 - 5.2 g/dL   AST 17 0 - 37 U/L   ALT 13 0 - 35 U/L   Alkaline Phosphatase 106 39 - 117 U/L   Total Bilirubin 1.2 0.3 - 1.2 mg/dL   GFR calc non Af Amer >90 >90 mL/min   GFR calc Af Amer >90 >90 mL/min   Anion gap 10 5 - 15  Protein / creatinine ratio, urine     Status: Abnormal   Collection Time: 12/10/14  9:21 PM  Result Value Ref Range   Creatinine, Urine 145.00 mg/dL   Total Protein, Urine 31 mg/dL   Protein Creatinine Ratio 0.21 (H) 0.00 - 0.15   PR/CR unchanged from last readings.   Assessment and Plan  A:  SIUP at [redacted]w[redacted]d       Hypertension with normal labs      Prodromal contractions with no cervical change  P; Discussed with Dr Ihor Dow      Discharge home      Will not start antihypertensives at this time      If BPs elevate more, would induce labor      Followup in clinic this week.  Has appt in 2 days with Doree Fudge CNM  [facility] 12/10/2014, 11:02 PM

## 2014-12-12 ENCOUNTER — Encounter: Payer: Self-pay | Admitting: Women's Health

## 2014-12-12 ENCOUNTER — Inpatient Hospital Stay (HOSPITAL_COMMUNITY)
Admission: AD | Admit: 2014-12-12 | Discharge: 2014-12-15 | DRG: 774 | Disposition: A | Discharge status: Home / Self Care | Payer: BLUE CROSS/BLUE SHIELD | Source: Ambulatory Visit | Attending: Obstetrics and Gynecology | Admitting: Obstetrics and Gynecology

## 2014-12-12 ENCOUNTER — Ambulatory Visit (INDEPENDENT_AMBULATORY_CARE_PROVIDER_SITE_OTHER): Payer: BLUE CROSS/BLUE SHIELD | Admitting: Women's Health

## 2014-12-12 ENCOUNTER — Encounter (HOSPITAL_COMMUNITY): Payer: Self-pay

## 2014-12-12 VITALS — BP 140/104 | Wt 210.5 lb

## 2014-12-12 DIAGNOSIS — O09893 Supervision of other high risk pregnancies, third trimester: Secondary | ICD-10-CM | POA: Diagnosis not present

## 2014-12-12 DIAGNOSIS — Z141 Cystic 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: Secondary | ICD-10-CM

## 2014-12-12 DIAGNOSIS — O1490 Unspecified pre-eclampsia, unspecified trimester: Secondary | ICD-10-CM | POA: Diagnosis present

## 2014-12-12 DIAGNOSIS — Z331 Pregnant state, incidental: Secondary | ICD-10-CM

## 2014-12-12 DIAGNOSIS — Z825 Family history of asthma and other chronic lower respiratory diseases: Secondary | ICD-10-CM | POA: Diagnosis not present

## 2014-12-12 DIAGNOSIS — Z8673 Personal history of transient ischemic attack (TIA), and cerebral infarction without residual deficits: Secondary | ICD-10-CM | POA: Diagnosis not present

## 2014-12-12 DIAGNOSIS — O133 Gestational [pregnancy-induced] hypertension without significant proteinuria, third trimester: Secondary | ICD-10-CM

## 2014-12-12 DIAGNOSIS — Z3A38 38 weeks gestation of pregnancy: Secondary | ICD-10-CM | POA: Diagnosis present

## 2014-12-12 DIAGNOSIS — Z6839 Body mass index (BMI) 39.0-39.9, adult: Secondary | ICD-10-CM

## 2014-12-12 DIAGNOSIS — O99214 Obesity complicating childbirth: Secondary | ICD-10-CM | POA: Diagnosis present

## 2014-12-12 DIAGNOSIS — O1493 Unspecified pre-eclampsia, third trimester: Secondary | ICD-10-CM

## 2014-12-12 DIAGNOSIS — O9852 Other viral diseases complicating childbirth: Secondary | ICD-10-CM | POA: Diagnosis present

## 2014-12-12 DIAGNOSIS — Z8249 Family history of ischemic heart disease and other diseases of the circulatory system: Secondary | ICD-10-CM | POA: Diagnosis not present

## 2014-12-12 DIAGNOSIS — O1413 Severe pre-eclampsia, third trimester: Principal | ICD-10-CM | POA: Diagnosis present

## 2014-12-12 DIAGNOSIS — O139 Gestational [pregnancy-induced] hypertension without significant proteinuria, unspecified trimester: Secondary | ICD-10-CM | POA: Insufficient documentation

## 2014-12-12 DIAGNOSIS — Z87891 Personal history of nicotine dependence: Secondary | ICD-10-CM

## 2014-12-12 DIAGNOSIS — O09299 Supervision of pregnancy with other poor reproductive or obstetric history, unspecified trimester: Secondary | ICD-10-CM

## 2014-12-12 DIAGNOSIS — B069 Rubella without complication: Secondary | ICD-10-CM | POA: Diagnosis present

## 2014-12-12 DIAGNOSIS — Z1389 Encounter for screening for other disorder: Secondary | ICD-10-CM

## 2014-12-12 LAB — URINALYSIS, ROUTINE W REFLEX MICROSCOPIC
BILIRUBIN URINE: NEGATIVE
GLUCOSE, UA: NEGATIVE mg/dL
HGB URINE DIPSTICK: NEGATIVE
KETONES UR: NEGATIVE mg/dL
Nitrite: NEGATIVE
PROTEIN: NEGATIVE mg/dL
Specific Gravity, Urine: >1.03 — ABNORMAL HIGH (ref 1.005–1.030)
UROBILINOGEN UA: 0.2 mg/dL (ref 0.0–1.0)
pH: 6 (ref 5.0–8.0)

## 2014-12-12 LAB — COMPREHENSIVE METABOLIC PANEL
ALT: 14 U/L (ref 0–35)
ANION GAP: 9 (ref 5–15)
AST: 15 U/L (ref 0–37)
Albumin: 3 g/dL — ABNORMAL LOW (ref 3.5–5.2)
Alkaline Phosphatase: 125 U/L — ABNORMAL HIGH (ref 39–117)
BILIRUBIN TOTAL: 1.1 mg/dL (ref 0.3–1.2)
BUN: 13 mg/dL (ref 6–23)
CHLORIDE: 109 meq/L (ref 96–112)
CO2: 21 mmol/L (ref 19–32)
Calcium: 9 mg/dL (ref 8.4–10.5)
Creatinine, Ser: 0.31 mg/dL — ABNORMAL LOW (ref 0.50–1.10)
GLUCOSE: 84 mg/dL (ref 70–99)
Potassium: 3.9 mmol/L (ref 3.5–5.1)
Sodium: 139 mmol/L (ref 135–145)
Total Protein: 5.9 g/dL — ABNORMAL LOW (ref 6.0–8.3)

## 2014-12-12 LAB — PROTEIN / CREATININE RATIO, URINE
Creatinine, Urine: 143 mg/dL
Protein Creatinine Ratio: 0.24 — ABNORMAL HIGH (ref 0.00–0.15)
TOTAL PROTEIN, URINE: 34 mg/dL

## 2014-12-12 LAB — POCT URINALYSIS DIPSTICK
Blood, UA: NEGATIVE
GLUCOSE UA: NEGATIVE
Ketones, UA: NEGATIVE
Nitrite, UA: NEGATIVE

## 2014-12-12 LAB — CBC
HEMATOCRIT: 38.6 % (ref 36.0–46.0)
Hemoglobin: 13.3 g/dL (ref 12.0–15.0)
MCH: 30.4 pg (ref 26.0–34.0)
MCHC: 34.5 g/dL (ref 30.0–36.0)
MCV: 88.3 fL (ref 78.0–100.0)
PLATELETS: 278 10*3/uL (ref 150–400)
RBC: 4.37 MIL/uL (ref 3.87–5.11)
RDW: 14.3 % (ref 11.5–15.5)
WBC: 13.7 10*3/uL — AB (ref 4.0–10.5)

## 2014-12-12 LAB — URINE MICROSCOPIC-ADD ON

## 2014-12-12 LAB — TYPE AND SCREEN
ABO/RH(D): A NEG
ANTIBODY SCREEN: POSITIVE
DAT, IgG: NEGATIVE

## 2014-12-12 LAB — RPR

## 2014-12-12 MED ORDER — ZOLPIDEM TARTRATE 5 MG PO TABS
5.0000 mg | ORAL_TABLET | Freq: Every evening | ORAL | Status: DC | PRN
  Administered 2014-12-12: 5 mg via ORAL
  Filled 2014-12-12: qty 1

## 2014-12-12 MED ORDER — MISOPROSTOL 25 MCG QUARTER TABLET
25.0000 ug | ORAL_TABLET | ORAL | Status: DC | PRN
Start: 2014-12-12 — End: 2014-12-13
  Administered 2014-12-12 – 2014-12-13 (×2): 25 ug via VAGINAL
  Filled 2014-12-12 (×2): qty 0.25

## 2014-12-12 MED ORDER — MISOPROSTOL 25 MCG QUARTER TABLET
50.0000 ug | ORAL_TABLET | Freq: Once | ORAL | Status: AC
Start: 2014-12-12 — End: 2014-12-12
  Administered 2014-12-12: 50 ug via ORAL
  Filled 2014-12-12: qty 0.5

## 2014-12-12 MED ORDER — LACTATED RINGERS IV SOLN: 500.0000 mL | INTRAVENOUS | Status: DC | PRN

## 2014-12-12 MED ORDER — OXYTOCIN 40 UNITS IN LACTATED RINGERS INFUSION - SIMPLE MED
62.5000 mL/h | INTRAVENOUS | Status: DC
  Administered 2014-12-13: 62.5 mL/h via INTRAVENOUS

## 2014-12-12 MED ORDER — OXYCODONE-ACETAMINOPHEN 5-325 MG PO TABS
2.0000 | ORAL_TABLET | ORAL | Status: DC | PRN
Start: 2014-12-12 — End: 2014-12-13

## 2014-12-12 MED ORDER — OXYTOCIN BOLUS FROM INFUSION: 500.0000 mL | INTRAVENOUS | Status: DC

## 2014-12-12 MED ORDER — CITRIC ACID-SODIUM CITRATE 334-500 MG/5ML PO SOLN: 30.0000 mL | ORAL | Status: DC | PRN

## 2014-12-12 MED ORDER — ALBUTEROL SULFATE (2.5 MG/3ML) 0.083% IN NEBU: 3.0000 mL | INHALATION_SOLUTION | RESPIRATORY_TRACT | Status: DC | PRN

## 2014-12-12 MED ORDER — MAGNESIUM SULFATE BOLUS VIA INFUSION
4.0000 g | Freq: Once | INTRAVENOUS | Status: AC
  Administered 2014-12-12: 4 g via INTRAVENOUS
  Filled 2014-12-12: qty 500

## 2014-12-12 MED ORDER — ACETAMINOPHEN 325 MG PO TABS
650.0000 mg | ORAL_TABLET | ORAL | Status: DC | PRN
  Administered 2014-12-13: 650 mg via ORAL
  Filled 2014-12-12: qty 2

## 2014-12-12 MED ORDER — LACTATED RINGERS IV SOLN
INTRAVENOUS | Status: DC
  Administered 2014-12-12 – 2014-12-13 (×4): via INTRAVENOUS

## 2014-12-12 MED ORDER — OXYCODONE-ACETAMINOPHEN 5-325 MG PO TABS
1.0000 | ORAL_TABLET | ORAL | Status: DC | PRN
Start: 2014-12-12 — End: 2014-12-13

## 2014-12-12 MED ORDER — LIDOCAINE HCL (PF) 1 % IJ SOLN
30.0000 mL | INTRAMUSCULAR | Status: DC | PRN
  Filled 2014-12-12: qty 30

## 2014-12-12 MED ORDER — ONDANSETRON HCL 4 MG/2ML IJ SOLN: 4.0000 mg | Freq: Four times a day (QID) | INTRAMUSCULAR | Status: DC | PRN

## 2014-12-12 MED ORDER — MAGNESIUM SULFATE 40 G IN LACTATED RINGERS - SIMPLE
2.0000 g/h | INTRAVENOUS | Status: DC
  Administered 2014-12-12 – 2014-12-13 (×2): 2 g/h via INTRAVENOUS
  Filled 2014-12-12 (×2): qty 500

## 2014-12-12 NOTE — Progress Notes (Signed)
High Risk Pregnancy Diagnosis(es): GHTN G2P1001 [redacted]w[redacted]d Estimated Date of Delivery: 12/24/14 BP 138/102 mmHg  Wt 210 lb 8 oz (95.482 kg)  LMP 03/12/2014, repeat bp 140/104 Urinalysis: Positive for trace protein HPI:  'Feels terrible', bp has been up since Monday- went to [facility] for r/o [facility], had neg pre-e labs and sent home. Has been checking bp's at home, diastolic consistently 94M-768G. Dizzy/lightheaded. Some ha's- nothing she's had to take apap for. No scotomata, ruq/epigastric pain, or n/v.  BP, weight, and urine reviewed.  Reports good fm. Denies regular uc's, lof, vb, uti s/s.  Fundal Height:  38 Fetal Heart rate:  135 Edema:  Trace DTRs: 3+ Clonus: 1 beat Lt foot, Rt foot very jerky- states this is normal d/t stroke  [facility] for [facility], possible IOL. Called Dr. Deniece Ree to notify her to be expecting pt.  Reviewed pre-e s/s, labor s/s, fkc All questions were answered Assessment: [redacted]w[redacted]d GHTN, r/o Pre-e Medication(s) Plans:  Continue baby asa Treatment Plan:  If d/c'd from hospital return on Monday (office closed Friday for holiday) to begin 2x/wk testing until IOL @ 39wks unless indicated earlier Follow up on Monday for high-risk OB appt and bpp/afi/efw/dopp u/s if d/c'd from [facility]

## 2014-12-12 NOTE — Plan of Care (Signed)
Problem: Consults Goal: Birthing Suites Patient Information Press F2 to bring up selections list  Outcome: Completed/Met Date Met:  12/12/14  Pt 37-[redacted] weeks EGA

## 2014-12-12 NOTE — MAU Note (Signed)
Patient was seen at [facility] this am and had elevated blood pressure. Sent to MAU for evaluation. Has a slight headache. Denies contractions, leaking or bleeding and reports good fetal movement.

## 2014-12-12 NOTE — Patient Instructions (Signed)
Call the office [phone]) or go to [facility] if:  You begin to have strong, frequent contractions  Your water breaks.  Sometimes it is a big gush of fluid, sometimes it is just a trickle that keeps getting your panties wet or running down your legs  You have vaginal bleeding.  It is normal to have a small amount of spotting if your cervix was checked.   You don't feel your baby moving like normal.  If you don't, get you something to eat and drink and lay down and focus on feeling your baby move.  You should feel at least 10 movements in 2 hours.  If you don't, you should call the office or go to [facility].    Call the office [phone]) or go to [facility] hospital for these signs of pre-eclampsia:  Severe headache that does not go away with Tylenol  Visual changes- seeing spots, double, blurred vision  Pain under your right breast or upper abdomen that does not go away with Tums or heartburn medicine  Nausea and/or vomiting  Severe swelling in your hands, feet, and face    Hypertension During Pregnancy Hypertension, or high blood pressure, is when there is extra pressure inside your blood vessels that carry blood from the heart to the rest of your body (arteries). It can happen at any time in life, including pregnancy. Hypertension during pregnancy can cause problems for you and your baby. Your baby might not weigh as much as he or she should at birth or might be born early (premature). Very bad cases of hypertension during pregnancy can be life-threatening.  Different types of hypertension can occur during pregnancy. These include:  Chronic hypertension. This happens when a woman has hypertension before pregnancy and it continues during pregnancy.  Gestational hypertension. This is when hypertension develops during pregnancy.  Preeclampsia or toxemia of pregnancy. This is a very serious type of hypertension that develops only during pregnancy. It affects the whole body and  can be very dangerous for both mother and baby.  Gestational hypertension and preeclampsia usually go away after your baby is born. Your blood pressure will likely stabilize within 6 weeks. Women who have hypertension during pregnancy have a greater chance of developing hypertension later in life or with future pregnancies. RISK FACTORS There are certain factors that make it more likely for you to develop hypertension during pregnancy. These include:  Having hypertension before pregnancy.  Having hypertension during a previous pregnancy.  Being overweight.  Being older than 40 years.  Being pregnant with more than one baby.  Having diabetes or kidney problems. SIGNS AND SYMPTOMS Chronic and gestational hypertension rarely cause symptoms. Preeclampsia has symptoms, which may include:  Increased protein in your urine. Your health care provider will check for this at every prenatal visit.  Swelling of your hands and face.  Rapid weight gain.  Headaches.  Visual changes.  Being bothered by light.  Abdominal pain, especially in the upper right area.  Chest pain.  Shortness of breath.  Increased reflexes.  Seizures. These occur with a more severe form of preeclampsia, called eclampsia. DIAGNOSIS  You may be diagnosed with hypertension during a regular prenatal exam. At each prenatal visit, you may have:  Your blood pressure checked.  A urine test to check for protein in your urine. The type of hypertension you are diagnosed with depends on when you developed it. It also depends on your specific blood pressure reading.  Developing hypertension before 20 weeks of  pregnancy is consistent with chronic hypertension.  Developing hypertension after 20 weeks of pregnancy is consistent with gestational hypertension.  Hypertension with increased urinary protein is diagnosed as preeclampsia.  Blood pressure measurements that stay above 253 systolic or 664 diastolic are a sign of  severe preeclampsia. TREATMENT Treatment for hypertension during pregnancy varies. Treatment depends on the type of hypertension and how serious it is.  If you take medicine for chronic hypertension, you may need to switch medicines.  Medicines called ACE inhibitors should not be taken during pregnancy.  Low-dose aspirin may be suggested for women who have risk factors for preeclampsia.  If you have gestational hypertension, you may need to take a blood pressure medicine that is safe during pregnancy. Your health care provider will recommend the correct medicine.  If you have severe preeclampsia, you may need to be in the hospital. Health care providers will watch you and your baby very closely. You also may need to take medicine called magnesium sulfate to prevent seizures and lower blood pressure.  Sometimes, an early delivery is needed. This may be the case if the condition worsens. It would be done to protect you and your baby. The only cure for preeclampsia is delivery.  Your health care provider may recommend that you take one low-dose aspirin (81 mg) each day to help prevent high blood pressure during your pregnancy if you are at risk for preeclampsia. You may be at risk for preeclampsia if:  You had preeclampsia or eclampsia during a previous pregnancy.  Your baby did not grow as expected during a previous pregnancy.  You experienced preterm birth with a previous pregnancy.  You experienced a separation of the placenta from the uterus (placental abruption) during a previous pregnancy.  You experienced the loss of your baby during a previous pregnancy.  You are pregnant with more than one baby.  You have other medical conditions, such as diabetes or an autoimmune disease. HOME CARE INSTRUCTIONS  Schedule and keep all of your regular prenatal care appointments. This is important.  Take medicines only as directed by your health care provider. Tell your health care provider  about all medicines you take.  Eat as little salt as possible.  Get regular exercise.  Do not drink alcohol.  Do not use tobacco products.  Do not drink products with caffeine.  Lie on your left side when resting. SEEK IMMEDIATE MEDICAL CARE IF:  You have severe abdominal pain.  You have sudden swelling in your hands, ankles, or face.  You gain 4 pounds (1.8 kg) or more in 1 week.  You vomit repeatedly.  You have vaginal bleeding.  You do not feel your baby moving as much.  You have a headache.  You have blurred or double vision.  You have muscle twitching or spasms.  You have shortness of breath.  You have blue fingernails or lips.  You have blood in your urine. MAKE SURE YOU:  Understand these instructions.  Will watch your condition.  Will get help right away if you are not doing well or get worse. Document Released: 08/18/2011 Document Revised: 04/16/2014 Document Reviewed: 06/29/2013 [facility] Patient Information 2015 [place], [place]. This information is not intended to replace advice given to you by your health care provider. Make sure you discuss any questions you have with your health care provider.

## 2014-12-12 NOTE — MAU Provider Note (Signed)
Chief Complaint:  Hypertension   First Provider Initiated Contact with Patient 12/12/14 1146      HPI: Tina Mckee is a 23 y.o. G2P1001 at [redacted]w[redacted]d pt of [facility] who presents to maternity admissions sent from office for elevated BP and new onset mild h/a today.  She has hx of preeclamsia with IOL and NSVD with previous pregnancy.  She denies epigastric pain or visual disturbances.  She reports good fetal movement, denies LOF, vaginal bleeding, vaginal itching/burning, urinary symptoms, dizziness, n/v, or fever/chills.     Past Medical History: Past Medical History  Diagnosis Date  . Stroke birth    rt hemiparesis  Perinatal  . Scoliosis   . Perinatal arterial ischemic stroke   . Gestational hypertension   . Supervision of other normal pregnancy 05/04/2014  . Asthma     Past obstetric history: OB History  Gravida Para Term Preterm AB SAB TAB Ectopic Multiple Living  2 1 1  0      1    # Outcome Date GA Lbr Len/2nd Weight Sex Delivery Anes PTL Lv  2 Current           1 Term 04/10/10 [redacted]w[redacted]d   F Vag-Spont EPI  Y      Past Surgical History: Past Surgical History  Procedure Laterality Date  . Foot surgery      rt foot  . Foot surgery      Bone placed    Family History: Family History  Problem Relation Age of Onset  . Hypertension Mother   . Asthma Mother   . Hypertension Father   . Asthma Sister   . Cancer Maternal Grandfather     pancreatic  . Cancer Paternal Grandmother     cancer all over  . Other Paternal Grandfather     brain damage    Social History: History  Substance Use Topics  . Smoking status: Former Smoker    Types: Cigarettes    Quit date: 07/11/2007  . Smokeless tobacco: Never Used  . Alcohol Use: No     Comment: prior to pregnancy    Allergies:  Allergies  Allergen Reactions  . Cephalexin Rash    Meds:  Prescriptions prior to admission  Medication Sig Dispense Refill Last Dose  . albuterol (PROVENTIL HFA;VENTOLIN HFA) 108 (90 BASE)  MCG/ACT inhaler Inhale 2 puffs into the lungs every 4 (four) hours as needed for wheezing or shortness of breath. 1 Inhaler 2 Past Month at Unknown time  . aspirin EC 81 MG tablet Take 1 tablet (81 mg total) by mouth daily. 100 tablet prn 12/12/2014 at Unknown time  . Prenatal Vit-Fe Fumarate-FA (PRENATAL MULTIVITAMIN) TABS tablet Take 1 tablet by mouth daily at 12 noon.   12/12/2014 at Unknown time    ROS: Pertinent findings in history of present illness.  Physical Exam  Blood pressure 150/108, pulse 96, temperature 98.1 F (36.7 C), temperature source Oral, resp. rate 20, last menstrual period 03/12/2014, SpO2 97 %.   Patient Vitals for the past 24 hrs:  BP Temp Temp src Pulse Resp SpO2  12/12/14 1202 (!) 150/108 mmHg - - 96 - -  12/12/14 1147 (!) 144/103 mmHg - - 96 - -  12/12/14 1132 (!) 139/101 mmHg - - 97 - -  12/12/14 1120 138/100 mmHg - - 92 - -  12/12/14 1057 140/100 mmHg 98.1 F (36.7 C) Oral 95 20 97 %   GENERAL: Well-developed, well-nourished female in no acute distress.  HEENT: normocephalic HEART: normal  rate RESP: normal effort ABDOMEN: Soft, non-tender, gravid appropriate for gestational age EXTREMITIES: Nontender, no edema NEURO: alert and oriented    FHT:  Baseline 130 , moderate variability, accelerations present, no decelerations Contractions: None on toco or to palpation   Labs: Results for orders placed or performed during the hospital encounter of 12/12/14 (from the past 24 hour(s))  Protein / creatinine ratio, urine     Status: Abnormal   Collection Time: 12/12/14 11:05 AM  Result Value Ref Range   Creatinine, Urine 143.00 mg/dL   Total Protein, Urine 34 mg/dL   Protein Creatinine Ratio 0.24 (H) 0.00 - 0.15  Urinalysis, Routine w reflex microscopic     Status: Abnormal   Collection Time: 12/12/14 11:05 AM  Result Value Ref Range   Color, Urine YELLOW YELLOW   APPearance CLEAR CLEAR   Specific Gravity, Urine >1.030 (H) 1.005 - 1.030   pH 6.0 5.0 -  8.0   Glucose, UA NEGATIVE NEGATIVE mg/dL   Hgb urine dipstick NEGATIVE NEGATIVE   Bilirubin Urine NEGATIVE NEGATIVE   Ketones, ur NEGATIVE NEGATIVE mg/dL   Protein, ur NEGATIVE NEGATIVE mg/dL   Urobilinogen, UA 0.2 0.0 - 1.0 mg/dL   Nitrite NEGATIVE NEGATIVE   Leukocytes, UA SMALL (A) NEGATIVE  Urine microscopic-add on     Status: Abnormal   Collection Time: 12/12/14 11:05 AM  Result Value Ref Range   Squamous Epithelial / LPF MANY (A) RARE   WBC, UA 11-20 <3 WBC/hpf   Bacteria, UA FEW (A) RARE   Crystals CA OXALATE CRYSTALS (A) NEGATIVE  CBC     Status: Abnormal   Collection Time: 12/12/14 11:45 AM  Result Value Ref Range   WBC 13.7 (H) 4.0 - 10.5 K/uL   RBC 4.37 3.87 - 5.11 MIL/uL   Hemoglobin 13.3 12.0 - 15.0 g/dL   HCT 38.6 36.0 - 46.0 %   MCV 88.3 78.0 - 100.0 fL   MCH 30.4 26.0 - 34.0 pg   MCHC 34.5 30.0 - 36.0 g/dL   RDW 14.3 11.5 - 15.5 %   Platelets 278 150 - 400 K/uL  Comprehensive metabolic panel     Status: Abnormal   Collection Time: 12/12/14 11:45 AM  Result Value Ref Range   Sodium 139 135 - 145 mmol/L   Potassium 3.9 3.5 - 5.1 mmol/L   Chloride 109 96 - 112 mEq/L   CO2 21 19 - 32 mmol/L   Glucose, Bld 84 70 - 99 mg/dL   BUN 13 6 - 23 mg/dL   Creatinine, Ser 0.31 (L) 0.50 - 1.10 mg/dL   Calcium 9.0 8.4 - 10.5 mg/dL   Total Protein 5.9 (L) 6.0 - 8.3 g/dL   Albumin 3.0 (L) 3.5 - 5.2 g/dL   AST 15 0 - 37 U/L   ALT 14 0 - 35 U/L   Alkaline Phosphatase 125 (H) 39 - 117 U/L   Total Bilirubin 1.1 0.3 - 1.2 mg/dL   GFR calc non Af Amer >90 >90 mL/min   GFR calc Af Amer >90 >90 mL/min   Anion gap 9 5 - 15    Assessment: 1. Hx of preeclampsia, prior pregnancy, currently pregnant, unspecified trimester   2. Gestational hypertension w/o significant proteinuria in 3rd trimester     Plan: Admit to [facility] for IOL for GHTN Anticipate NSVD     Medication List    ASK your doctor about these medications        albuterol 108 (90 BASE) MCG/ACT  inhaler  Commonly known as:  PROVENTIL HFA;VENTOLIN HFA  Inhale 2 puffs into the lungs every 4 (four) hours as needed for wheezing or shortness of breath.     aspirin EC 81 MG tablet  Take 1 tablet (81 mg total) by mouth daily.     prenatal multivitamin Tabs tablet  Take 1 tablet by mouth daily at 12 noon.        Fatima Blank Certified Nurse-Midwife 12/12/2014 1:02 PM

## 2014-12-12 NOTE — MAU Note (Signed)
BP cycling stopped per CNM

## 2014-12-12 NOTE — MAU Note (Signed)
Report given to Myrtie Neither charge RN on [employer]. Will go to room 176 in 20 minutes

## 2014-12-12 NOTE — H&P (Signed)
Tina Mckee is a 23 y.o. female sent from office for evaluation for preeclampsia.  Pt reports headache for past two days.  Denies vision changes or epigastric pain.  Received prenatal care beginning at 6 wks IUP dated by 1st trimester ultrasound.  Pregnancy complicated by history of preeclampsia.    History OB History    Gravida Para Term Preterm AB TAB SAB Ectopic Multiple Living   2 1 1  0      1     Past Medical History  Diagnosis Date  . Stroke birth    rt hemiparesis  Perinatal  . Scoliosis   . Perinatal arterial ischemic stroke   . Gestational hypertension   . Supervision of other normal pregnancy 05/04/2014  . Asthma    Past Surgical History  Procedure Laterality Date  . Foot surgery      rt foot  . Foot surgery      Bone placed   Family History: family history includes Asthma in her mother and sister; Cancer in her maternal grandfather and paternal grandmother; Hypertension in her father and mother; Other in her paternal grandfather. Social History:  reports that she quit smoking about 7 years ago. Her smoking use included Cigarettes. She smoked 0.00 packs per day. She has never used smokeless tobacco. She reports that she does not drink alcohol or use illicit drugs.  Review of Systems  Eyes: Negative for blurred vision and double vision.  Gastrointestinal: Negative for abdominal pain.  Neurological: Positive for headaches.  All other systems reviewed and are negative.   Dilation: 1 Effacement (%): Thick Exam by:: W.Karim, CNM Blood pressure 150/108, pulse 96, temperature 98.1 F (36.7 C), temperature source Oral, resp. rate 20, last menstrual period 03/12/2014, SpO2 97 %. Maternal Exam:  Abdomen: Fetal presentation: vertex  Introitus: Vagina is positive for vaginal discharge (mucusy).  Pelvis: adequate for delivery.      Fetal Exam Fetal Monitor Review: Baseline rate: 130's.  Pattern: accelerations present.    Fetal State Assessment: Category I -  tracings are normal.     Physical Exam  Constitutional: She is oriented to person, place, and time. She appears well-developed and well-nourished. No distress.  HENT:  Head: Normocephalic.  Neck: Normal range of motion. Neck supple.  Cardiovascular: Normal rate, regular rhythm and normal heart sounds.   Respiratory: Effort normal and breath sounds normal.  GI: Soft. There is no tenderness.  Genitourinary: No bleeding in the vagina. Vaginal discharge (mucusy) found.  Musculoskeletal: Normal range of motion. She exhibits edema (1+ bilat pedal edema).  Neurological: She is alert and oriented to person, place, and time.  Skin: Skin is warm and dry.    Prenatal labs: ABO, Rh: A/NEG/-- (05/22 1135) Antibody: NEG (10/13 0859) Rubella: 0.88 (05/22 1135) RPR: NON REAC (10/13 0859)  HBsAg: NEGATIVE (05/22 1135)  HIV: NONREACTIVE (10/13 0859)  GBS: NOT DETECTED (12/23 1112)   Assessment/Plan: 23 yo G2P1001 at [redacted]w[redacted]d wks IUP Preeclampsia w/severe features GBS negative  Plan: Admit to [facility] augmentation with cytotec Magnesium Sulfate for seizure prophylaxis   KARIM, Vicky Schleich N 12/12/2014, 1:32 PM

## 2014-12-12 NOTE — MAU Note (Signed)
Urine in lab 

## 2014-12-13 ENCOUNTER — Encounter (HOSPITAL_COMMUNITY): Payer: Self-pay | Admitting: Anesthesiology

## 2014-12-13 ENCOUNTER — Inpatient Hospital Stay (HOSPITAL_COMMUNITY): Payer: BLUE CROSS/BLUE SHIELD | Admitting: Anesthesiology

## 2014-12-13 DIAGNOSIS — B069 Rubella without complication: Secondary | ICD-10-CM

## 2014-12-13 DIAGNOSIS — O1413 Severe pre-eclampsia, third trimester: Secondary | ICD-10-CM

## 2014-12-13 DIAGNOSIS — O9852 Other viral diseases complicating childbirth: Secondary | ICD-10-CM

## 2014-12-13 LAB — CBC
HEMATOCRIT: 39.2 % (ref 36.0–46.0)
HEMATOCRIT: 38 % (ref 36.0–46.0)
Hemoglobin: 13.5 g/dL (ref 12.0–15.0)
Hemoglobin: 13.2 g/dL (ref 12.0–15.0)
MCH: 30.4 pg (ref 26.0–34.0)
MCH: 30.6 pg (ref 26.0–34.0)
MCHC: 34.4 g/dL (ref 30.0–36.0)
MCHC: 34.7 g/dL (ref 30.0–36.0)
MCV: 88.3 fL (ref 78.0–100.0)
MCV: 88.2 fL (ref 78.0–100.0)
PLATELETS: 282 10*3/uL (ref 150–400)
PLATELETS: 284 10*3/uL (ref 150–400)
RBC: 4.44 MIL/uL (ref 3.87–5.11)
RBC: 4.31 MIL/uL (ref 3.87–5.11)
RDW: 14.4 % (ref 11.5–15.5)
RDW: 14.2 % (ref 11.5–15.5)
WBC: 15.2 10*3/uL — AB (ref 4.0–10.5)
WBC: 26.7 10*3/uL — AB (ref 4.0–10.5)

## 2014-12-13 MED ORDER — MISOPROSTOL 200 MCG PO TABS
1000.0000 ug | ORAL_TABLET | ORAL | Status: AC
  Administered 2014-12-13: 1000 ug via RECTAL

## 2014-12-13 MED ORDER — CARBOPROST TROMETHAMINE 250 MCG/ML IM SOLN: 250.0000 ug | Freq: Once | INTRAMUSCULAR | Status: DC

## 2014-12-13 MED ORDER — FLEET ENEMA 7-19 GM/118ML RE ENEM: 1.0000 | ENEMA | Freq: Every day | RECTAL | Status: DC | PRN

## 2014-12-13 MED ORDER — SIMETHICONE 80 MG PO CHEW: 80.0000 mg | CHEWABLE_TABLET | ORAL | Status: DC | PRN

## 2014-12-13 MED ORDER — PHENYLEPHRINE 40 MCG/ML (10ML) SYRINGE FOR IV PUSH (FOR BLOOD PRESSURE SUPPORT)
80.0000 ug | PREFILLED_SYRINGE | INTRAVENOUS | Status: DC | PRN
  Administered 2014-12-13: 80 ug via INTRAVENOUS
  Filled 2014-12-13: qty 2

## 2014-12-13 MED ORDER — ONDANSETRON HCL 4 MG/2ML IJ SOLN
4.0000 mg | INTRAMUSCULAR | Status: DC | PRN
Start: 2014-12-13 — End: 2014-12-15

## 2014-12-13 MED ORDER — EPHEDRINE 5 MG/ML INJ
10.0000 mg | INTRAVENOUS | Status: DC | PRN
  Filled 2014-12-13: qty 2

## 2014-12-13 MED ORDER — DIBUCAINE 1 % RE OINT: 1.0000 "application " | TOPICAL_OINTMENT | RECTAL | Status: DC | PRN

## 2014-12-13 MED ORDER — LACTATED RINGERS IV SOLN
500.0000 mL | Freq: Once | INTRAVENOUS | Status: AC
  Administered 2014-12-13: 500 mL via INTRAVENOUS

## 2014-12-13 MED ORDER — FENTANYL CITRATE 0.05 MG/ML IJ SOLN
100.0000 ug | INTRAMUSCULAR | Status: DC | PRN
Start: 2014-12-13 — End: 2014-12-13
  Administered 2014-12-13: 100 ug via INTRAVENOUS
  Filled 2014-12-13: qty 2

## 2014-12-13 MED ORDER — SODIUM CHLORIDE 0.9 % IV SOLN: 250.0000 mL | INTRAVENOUS | Status: DC | PRN

## 2014-12-13 MED ORDER — LIDOCAINE HCL (PF) 1 % IJ SOLN
INTRAMUSCULAR | Status: DC | PRN
  Administered 2014-12-13: 3 mL
  Administered 2014-12-13: 4 mL

## 2014-12-13 MED ORDER — OXYTOCIN 40 UNITS IN LACTATED RINGERS INFUSION - SIMPLE MED
1.0000 m[IU]/min | INTRAVENOUS | Status: DC
  Filled 2014-12-13: qty 1000

## 2014-12-13 MED ORDER — OXYTOCIN 40 UNITS IN LACTATED RINGERS INFUSION - SIMPLE MED: 62.5000 mL/h | INTRAVENOUS | Status: DC | PRN

## 2014-12-13 MED ORDER — MISOPROSTOL 200 MCG PO TABS
ORAL_TABLET | ORAL | Status: AC
  Filled 2014-12-13: qty 5

## 2014-12-13 MED ORDER — CARBOPROST TROMETHAMINE 250 MCG/ML IM SOLN
INTRAMUSCULAR | Status: AC
  Filled 2014-12-13: qty 1

## 2014-12-13 MED ORDER — WITCH HAZEL-GLYCERIN EX PADS: 1.0000 "application " | MEDICATED_PAD | CUTANEOUS | Status: DC | PRN

## 2014-12-13 MED ORDER — DIPHENHYDRAMINE HCL 50 MG/ML IJ SOLN: 12.5000 mg | INTRAMUSCULAR | Status: DC | PRN

## 2014-12-13 MED ORDER — FENTANYL CITRATE 0.05 MG/ML IJ SOLN
INTRAMUSCULAR | Status: DC | PRN
  Administered 2014-12-13: 100 ug via EPIDURAL

## 2014-12-13 MED ORDER — LANOLIN HYDROUS EX OINT: TOPICAL_OINTMENT | CUTANEOUS | Status: DC | PRN

## 2014-12-13 MED ORDER — SENNOSIDES-DOCUSATE SODIUM 8.6-50 MG PO TABS
2.0000 | ORAL_TABLET | ORAL | Status: DC
  Administered 2014-12-14: 2 via ORAL
  Filled 2014-12-13 (×2): qty 2

## 2014-12-13 MED ORDER — SODIUM BICARBONATE 8.4 % IV SOLN
INTRAVENOUS | Status: DC | PRN
  Administered 2014-12-13: 5 mL via EPIDURAL
  Administered 2014-12-13: 3 mL via EPIDURAL
  Administered 2014-12-13 (×2): 5 mL via EPIDURAL

## 2014-12-13 MED ORDER — MEASLES, MUMPS & RUBELLA VAC ~~LOC~~ INJ: 0.5000 mL | INJECTION | Freq: Once | SUBCUTANEOUS | Status: DC

## 2014-12-13 MED ORDER — IBUPROFEN 600 MG PO TABS
600.0000 mg | ORAL_TABLET | Freq: Four times a day (QID) | ORAL | Status: DC
  Administered 2014-12-13 – 2014-12-15 (×6): 600 mg via ORAL
  Filled 2014-12-13 (×6): qty 1

## 2014-12-13 MED ORDER — SODIUM CHLORIDE 0.9 % IJ SOLN: 3.0000 mL | INTRAMUSCULAR | Status: DC | PRN

## 2014-12-13 MED ORDER — FENTANYL CITRATE 0.05 MG/ML IJ SOLN
INTRAMUSCULAR | Status: AC
  Filled 2014-12-13: qty 2

## 2014-12-13 MED ORDER — PHENYLEPHRINE 40 MCG/ML (10ML) SYRINGE FOR IV PUSH (FOR BLOOD PRESSURE SUPPORT)
80.0000 ug | PREFILLED_SYRINGE | INTRAVENOUS | Status: DC | PRN
  Filled 2014-12-13: qty 2
  Filled 2014-12-13 (×2): qty 10

## 2014-12-13 MED ORDER — ZOLPIDEM TARTRATE 5 MG PO TABS
5.0000 mg | ORAL_TABLET | Freq: Every evening | ORAL | Status: DC | PRN
Start: 2014-12-13 — End: 2014-12-15

## 2014-12-13 MED ORDER — MISOPROSTOL 200 MCG PO TABS
50.0000 ug | ORAL_TABLET | ORAL | Status: DC | PRN
  Administered 2014-12-13: 50 ug via ORAL
  Filled 2014-12-13: qty 0.5

## 2014-12-13 MED ORDER — SODIUM CHLORIDE 0.9 % IJ SOLN: 3.0000 mL | Freq: Two times a day (BID) | INTRAMUSCULAR | Status: DC

## 2014-12-13 MED ORDER — FENTANYL 2.5 MCG/ML BUPIVACAINE 1/10 % EPIDURAL INFUSION (WH - ANES)
14.0000 mL/h | INTRAMUSCULAR | Status: DC | PRN
  Administered 2014-12-13 (×2): 14 mL/h via EPIDURAL
  Filled 2014-12-13 (×2): qty 125

## 2014-12-13 MED ORDER — FENTANYL 2.5 MCG/ML BUPIVACAINE 1/10 % EPIDURAL INFUSION (WH - ANES)
INTRAMUSCULAR | Status: DC | PRN
  Administered 2014-12-13: 13 mL/h via EPIDURAL

## 2014-12-13 MED ORDER — DIPHENHYDRAMINE HCL 25 MG PO CAPS: 25.0000 mg | ORAL_CAPSULE | Freq: Four times a day (QID) | ORAL | Status: DC | PRN

## 2014-12-13 MED ORDER — ONDANSETRON HCL 4 MG PO TABS: 4.0000 mg | ORAL_TABLET | ORAL | Status: DC | PRN

## 2014-12-13 MED ORDER — BISACODYL 10 MG RE SUPP
10.0000 mg | Freq: Every day | RECTAL | Status: DC | PRN
Start: 2014-12-13 — End: 2014-12-15

## 2014-12-13 MED ORDER — PRENATAL MULTIVITAMIN CH
1.0000 | ORAL_TABLET | Freq: Every day | ORAL | Status: DC
  Administered 2014-12-14 – 2014-12-15 (×2): 1 via ORAL
  Filled 2014-12-13 (×2): qty 1

## 2014-12-13 MED ORDER — BENZOCAINE-MENTHOL 20-0.5 % EX AERO: 1.0000 "application " | INHALATION_SPRAY | CUTANEOUS | Status: DC | PRN

## 2014-12-13 NOTE — Progress Notes (Signed)
1020 foley bulb fell out in bathroom. Few drops clear fluid noted on floor. Lab called for stat cbc to get epidural process started.

## 2014-12-13 NOTE — Progress Notes (Signed)
LABOR PROGRESS NOTE  Tina Mckee is a 23 y.o. G2P1001 at [redacted]w[redacted]d  admitted for induction of labor due to preEclampsia with severe features on magnesium.  Subjective: comfortable  Objective: BP 135/88 mmHg  Pulse 74  Temp(Src) 98 F (36.7 C) (Oral)  Resp 20  Ht 5\' 2"  (1.575 m)  Wt 210 lb 8 oz (95.482 kg)  BMI 38.49 kg/m2  SpO2 97%  LMP 03/12/2014 or  Filed Vitals:   12/13/14 0601 12/13/14 0701 12/13/14 0802 12/13/14 0902  BP: 128/94 132/87 133/83 135/88  Pulse: 74 80 81 74  Temp: 98.1 F (36.7 C)   98 F (36.7 C)  TempSrc: Oral   Oral  Resp: 18  18 20   Height:      Weight:      SpO2:        Total I/O In: 622.9 [P.O.:250; I.V.:372.9] Out: 500 [Urine:500]  FHT:  FHR: 130 bpm, variability: moderate,  accelerations:  Present,  decelerations:  Absent UC:   q7  SVE:   Dilation: 2.5 Effacement (%): 60 Station: -2 Exam by:: [email or web address],rnc-ob  Dilation: 2.5 Effacement (%): 60 Cervical Position: Posterior Station: -2 Presentation: Vertex Exam by:: rzhang,rnc-ob   Labs: Lab Results  Component Value Date   WBC 13.7* 12/12/2014   HGB 13.3 12/12/2014   HCT 38.6 12/12/2014   MCV 88.3 12/12/2014   PLT 278 12/12/2014    Assessment / Plan: IOL 2/2 preeclampsia currently on magnesium  Labor: Progressing normally, FB placed at 5945 without complication, give next dose of cytotec now (4th dose) Fetal Wellbeing:  Category I Pain Control:  Labor support without medications Anticipated MOD:  NSVD  [place] Huberty ROCIO, MD 12/13/2014, 9:21 AM

## 2014-12-13 NOTE — Anesthesia Procedure Notes (Signed)
Epidural Patient location during procedure: OB Start time: 12/13/2014 11:38 AM  Staffing Anesthesiologist: Fatmata Legere A. Performed by: anesthesiologist   Preanesthetic Checklist Completed: patient identified, site marked, surgical consent, pre-op evaluation, timeout performed, IV checked, risks and benefits discussed and monitors and equipment checked  Epidural Patient position: sitting Prep: site prepped and draped and DuraPrep Patient monitoring: continuous pulse ox and blood pressure Approach: midline Location: L3-L4 Injection technique: LOR air  Needle:  Needle type: Tuohy  Needle gauge: 17 G Needle length: 9 cm and 9 Needle insertion depth: 5 cm cm Catheter type: closed end flexible Catheter size: 19 Gauge Catheter at skin depth: 10 cm Test dose: negative and Other  Assessment Events: blood not aspirated, injection not painful, no injection resistance, negative IV test and no paresthesia  Additional Notes Patient identified. Risks and benefits discussed including failed block, incomplete  Pain control, post dural puncture headache, nerve damage, paralysis, blood pressure Changes, nausea, vomiting, reactions to medications-both toxic and allergic and post Partum back pain. All questions were answered. Patient expressed understanding and wished to proceed. Sterile technique was used throughout procedure. Epidural site was Dressed with sterile barrier dressing. No paresthesias, signs of intravascular injection Or signs of intrathecal spread were encountered.  Patient was more comfortable after the epidural was dosed. Please see RN's note for documentation of vital signs and FHR which are stable.

## 2014-12-13 NOTE — Plan of Care (Signed)
Problem: Phase I Progression Outcomes Goal: Assess per MD/Nurse,Routine-VS,FHR,UC,Head to Toe assess VS monitored closely per magnesium protocol. Pt educated on signs of worsening preeclampsia. Pt had mild headache this am but felt it was relieved after eating.

## 2014-12-13 NOTE — Anesthesia Preprocedure Evaluation (Signed)
Anesthesia Evaluation  Patient identified by MRN, date of birth, ID band Patient awake    Reviewed: Allergy & Precautions, H&P , NPO status , Patient's Chart, lab work & pertinent test results  Airway Mallampati: II  TM Distance: >3 FB Neck ROM: full    Dental no notable dental hx.    Pulmonary former smoker,  breath sounds clear to auscultation  Pulmonary exam normal       Cardiovascular hypertension, Pt. on medications Rhythm:regular Rate:Normal     Neuro/Psych Happened when a baby. Residual R sided weakness CVA, Residual Symptoms negative psych ROS   GI/Hepatic negative GI ROS, Neg liver ROS,   Endo/Other  Morbid obesity  Renal/GU negative Renal ROS     Musculoskeletal   Abdominal   Peds  Hematology negative hematology ROS (+)   Anesthesia Other Findings   Reproductive/Obstetrics (+) Pregnancy                             Anesthesia Physical Anesthesia Plan  ASA: III  Anesthesia Plan: Epidural   Post-op Pain Management:    Induction:   Airway Management Planned:   Additional Equipment:   Intra-op Plan:   Post-operative Plan:   Informed Consent: I have reviewed the patients History and Physical, chart, labs and discussed the procedure including the risks, benefits and alternatives for the proposed anesthesia with the patient or authorized representative who has indicated his/her understanding and acceptance.     Plan Discussed with:   Anesthesia Plan Comments:         Anesthesia Quick Evaluation

## 2014-12-14 LAB — CBC
HCT: 33.9 % — ABNORMAL LOW (ref 36.0–46.0)
Hemoglobin: 11.6 g/dL — ABNORMAL LOW (ref 12.0–15.0)
MCH: 30 pg (ref 26.0–34.0)
MCHC: 34.2 g/dL (ref 30.0–36.0)
MCV: 87.6 fL (ref 78.0–100.0)
Platelets: 257 10*3/uL (ref 150–400)
RBC: 3.87 MIL/uL (ref 3.87–5.11)
RDW: 14.4 % (ref 11.5–15.5)
WBC: 21.8 10*3/uL — AB (ref 4.0–10.5)

## 2014-12-14 LAB — MRSA PCR SCREENING: MRSA by PCR: NEGATIVE

## 2014-12-14 LAB — COMPREHENSIVE METABOLIC PANEL
ALT: 13 U/L (ref 0–35)
AST: 18 U/L (ref 0–37)
Albumin: 2.4 g/dL — ABNORMAL LOW (ref 3.5–5.2)
Alkaline Phosphatase: 90 U/L (ref 39–117)
Anion gap: 6 (ref 5–15)
BILIRUBIN TOTAL: 1.6 mg/dL — AB (ref 0.3–1.2)
BUN: 9 mg/dL (ref 6–23)
CO2: 25 mmol/L (ref 19–32)
Calcium: 7.1 mg/dL — ABNORMAL LOW (ref 8.4–10.5)
Chloride: 107 mEq/L (ref 96–112)
Creatinine, Ser: <0.3 mg/dL — ABNORMAL LOW (ref 0.50–1.10)
Glucose, Bld: 104 mg/dL — ABNORMAL HIGH (ref 70–99)
Potassium: 3.5 mmol/L (ref 3.5–5.1)
SODIUM: 138 mmol/L (ref 135–145)
Total Protein: 4.9 g/dL — ABNORMAL LOW (ref 6.0–8.3)

## 2014-12-14 MED ORDER — LACTATED RINGERS IV SOLN
INTRAVENOUS | Status: DC
  Administered 2014-12-14: 100 mL via INTRAVENOUS
  Administered 2014-12-14: 14:00:00 via INTRAVENOUS

## 2014-12-14 MED ORDER — RHO D IMMUNE GLOBULIN 1500 UNIT/2ML IJ SOSY
300.0000 ug | PREFILLED_SYRINGE | Freq: Once | INTRAMUSCULAR | Status: AC
  Administered 2014-12-14: 300 ug via INTRAVENOUS
  Filled 2014-12-14: qty 2

## 2014-12-14 MED ORDER — MAGNESIUM SULFATE 40 G IN LACTATED RINGERS - SIMPLE
2.0000 g/h | INTRAVENOUS | Status: AC
  Administered 2014-12-14: 2 g/h via INTRAVENOUS
  Filled 2014-12-14: qty 500

## 2014-12-14 NOTE — Lactation Note (Signed)
This note was copied from the chart of Tina Elveta Rape. Lactation Consultation Note  Patient Name: Tina Mckee Date: 12/14/2014 Reason for consult: Initial assessment; <6 lbs  Initial consult with a 46 hr old, 38.3 GA, BW 5 lbs, 12.8 oz.  Mom is a P2 and attempted breastfeeding with first child but states that her "milk never came in"; also states the first child had difficulty latching and this baby is doing better with latching. FOB in room and is supportive and helps with positioning.  Mom has limited use of her right hand d/t an in-utero stroke when she was a baby that affected her right hand and function of right hand.  Mom has been using hand pump and is able to get 1-3 ml colostrum from hand pump.  Infant has breastfed x4 (10-20 ml) + attempt x1 (10 min- current feeding on left side (harder side)) + EBM x2 (1-3 ml) since birth 15 hrs ago; voids-2; stools-0.  LS-7.  Mom stated the left side she has difficulty latching baby.  Attempted latching with LC assistance using cross-cradle and football hold.  Infant kept tongue thrusting and then got frustrated; at that point [facility] taught dad how to spoon feed and he spoon fed 1 ml colostrum to infant that mom had pre-pumped prior to [facility] arrival which helped calm infant and then [facility] put infant back STS with mom.  Encouraged mom to feed infant with cues and at least every 3 hours (since < 6 lbs) either by breast or by both breast and with EBM.  Curved-tip syring given and shown how to use at breast and how to use for spoon feeding.  Discussed having RN set her up with a DEBP and to use hands-on pumping method to maximize milk output.  Mom concerned about being able to breastfeed at home independently without assistance getting baby latched.  Encouragement given.  Encouraged to call insurance company to discuss pump options and benefits.  Lactation brochure given and left at bedside.  Encouraged to call for assistance as needed.     Maternal  Data Has patient been taught Hand Expression?: Yes (observation of colostrum) Does the patient have breastfeeding experience prior to this delivery?: Yes (attempted with first child "milk never came in")  Feeding Feeding Type: Breast Milk Length of feed: 10 min (on/off - tongue thrusting)  LATCH Score/Interventions Latch: Repeated attempts needed to sustain latch, nipple held in mouth throughout feeding, stimulation needed to elicit sucking reflex. (on left side) Intervention(s): Adjust position;Assist with latch;Breast compression  Audible Swallowing: A few with stimulation Intervention(s): Hand expression  Type of Nipple: Everted at rest and after stimulation  Comfort (Breast/Nipple): Soft / non-tender     Hold (Positioning): Assistance needed to correctly position infant at breast and maintain latch. Intervention(s): Breastfeeding basics reviewed;Support Pillows;Position options;Skin to skin  LATCH Score: 7  Lactation Tools Discussed/Used Tools: Other (comment) (spoon) WIC Program: Yes   Consult Status Consult Status: Follow-up Date: 12/15/14 Follow-up type: In-patient    Merlene Laughter 12/14/2014, 11:03 AM

## 2014-12-14 NOTE — Plan of Care (Signed)
Problem: Phase II Progression Outcomes Goal: Pain controlled on oral analgesia Outcome: Completed/Met Date Met:  12/14/14 God pain control on po Motrin. Goal: Progress activity as tolerated unless otherwise ordered Outcome: Completed/Met Date Met:  12/14/14 Ambulates in room and tolerates well. Goal: Afebrile, VS remain stable Outcome: Completed/Met Date Met:  12/14/14 Bp stable thus far off of Magnesium drip. Goal: Tolerating diet Outcome: Completed/Met Date Met:  12/14/14 Tolerates Regular diet well.

## 2014-12-14 NOTE — Plan of Care (Signed)
Problem: Phase I Progression Outcomes Goal: Foley catheter patent Outcome: Not Applicable Date Met:  12/14/14 Removed     

## 2014-12-14 NOTE — Progress Notes (Signed)
Pump in hand misreading pulse

## 2014-12-14 NOTE — Progress Notes (Signed)
Post Partum Day 1 Subjective: no complaints and headache resolved.  Objective: Blood pressure 116/62, pulse 103, temperature 98.4 F (36.9 C), temperature source Oral, resp. rate 18, height 5\' 2"  (1.575 m), weight 98.204 kg (216 lb 8 oz), last menstrual period 03/12/2014, SpO2 98 %, unknown if currently breastfeeding.  Physical Exam:  General: alert and no distress Lochia: appropriate Uterine Fundus: deep in pelvis well below umbilicus Incision:  DVT Evaluation: No evidence of DVT seen on physical exam.   Recent Labs  12/13/14 1930 12/14/14 0535  HGB 13.2 11.6*  HCT 38.0 33.9*    Assessment/Plan: D/c Mag at 6 pm,    LOS: 2 days   Ritchie Klee V 12/14/2014, 2:04 PM

## 2014-12-14 NOTE — Anesthesia Postprocedure Evaluation (Signed)
Anesthesia Post Note  Patient: Tina Mckee  Procedure(s) Performed: * No procedures listed *  Anesthesia type: Epidural  Patient location: Mother/Baby  Post pain: Pain level controlled  Post assessment: Post-op Vital signs reviewed  Last Vitals:  Filed Vitals:   12/14/14 0700  BP: 117/73  Pulse: 89  Temp:   Resp:     Post vital signs: Reviewed  Level of consciousness: awake  Complications: No apparent anesthesia complications

## 2014-12-14 NOTE — Plan of Care (Signed)
Problem: Discharge Progression Outcomes Goal: MMR given as ordered Outcome: Not Applicable Date Met:  93/40/68 Patient is Rubella non immune but just had Rhogam.MMR should be held x 3 months because it renders MMR less effective.

## 2014-12-14 NOTE — Plan of Care (Signed)
Problem: Phase II Progression Outcomes Goal: Rh isoimmunization per orders Outcome: Completed/Met Date Met:  12/14/14 Rhogam given on 12/14/2014 IV.     

## 2014-12-15 LAB — RH IG WORKUP (INCLUDES ABO/RH)
ABO/RH(D): A NEG
FETAL SCREEN: NEGATIVE
Gestational Age(Wks): 38
Unit division: 0

## 2014-12-15 NOTE — Progress Notes (Signed)
Records indicate patient is rubella non-immune. Per call to Pharmacy, MMR should not be administered until  3 months after receiving Rhogam postpartum. Patient received Rhogam during this admission; therefore, MMR vaccine was not administered. Deniece Ree, RN

## 2014-12-15 NOTE — Discharge Summary (Signed)
Obstetric Discharge Summary Reason for Admission: induction of labor for preeclampsia w/ severe features Prenatal Procedures: none Intrapartum Procedures: spontaneous vaginal delivery Postpartum Procedures: none Complications-Operative and Postpartum: none HEMOGLOBIN  Date Value Ref Range Status  12/14/2014 11.6* 12.0 - 15.0 g/dL Final   HCT  Date Value Ref Range Status  12/14/2014 33.9* 36.0 - 46.0 % Final   Tina Mckee is a 24yo G2P1001 admitted for IOL due to preeclampsia w/ severe features (H/A) on the afternoon of 12/30 @ 38.2wks. Her PIH bloodwork was WNL, but her BPs were elevated with some in the severe range and so mag sulfate was started. Cytotec was initially used for cx ripening. She progressed to SVD by the evening of 12/31. She was given cytotec for PP bleeding, and also stayed on mag x 24 hrs post delivery. Her BP remained WNL for the most part during the PP period without medication. By PPD#2 she is deemed to have received the full benefit of her hospital stay and will be discharged home. She is breast and bottlefeeding. She will further explore contraception at her PP visit. (She had a hx of a CVA at her own birth, and so hormonal contraception has to be taken into consideration.)  Physical Exam:  General: alert, cooperative and no distress  Lungs: nl effort Heart: RRR Lochia: appropriate Uterine Fundus: firm DVT Evaluation: No evidence of DVT seen on physical exam.  Discharge Diagnoses: Term Pregnancy-delivered  Discharge Information: Date: 12/15/2014 Activity: pelvic rest Diet: routine Medications: PNV Condition: stable Instructions: refer to practice specific booklet Discharge to: home Follow-up Information    Follow up with [facility]. Schedule an appointment as soon as possible for a visit in 4 weeks.   Why:  For your postpartum appointment.   Contact information:   [place] [id number] [phone]      Newborn  Data: Live born female  Birth Weight: 5 lb 12.8 oz (2631 g) APGAR: 8, 8  Home with mother.  Tina Mckee CNM 12/15/2014, 9:14 AM

## 2014-12-16 ENCOUNTER — Ambulatory Visit: Payer: Self-pay

## 2014-12-16 NOTE — Lactation Note (Signed)
This note was copied from the chart of Tina Mckee. Lactation Consultation Note  Mom states baby is latching and nursing well.  Milk is coming in and mom just pumped 10 mls.  Instructed to give any EBM back to baby.  Mom has a DEBP at home.  Instructed to keep a feeding diary the first week.  Pedi appt in the AM.  Outpatient lactation services reviewed and encouraged.  Patient Name: Tina Terie Lear [id number] Date: 12/16/2014     Maternal Data    Feeding    LATCH Score/Interventions                      Lactation Tools Discussed/Used     Consult Status      Ave Filter 12/16/2014, 10:30 AM

## 2014-12-17 ENCOUNTER — Encounter: Payer: BC Managed Care – PPO | Admitting: Women's Health

## 2014-12-17 ENCOUNTER — Other Ambulatory Visit: Payer: BC Managed Care – PPO

## 2015-01-16 ENCOUNTER — Ambulatory Visit (INDEPENDENT_AMBULATORY_CARE_PROVIDER_SITE_OTHER): Payer: BLUE CROSS/BLUE SHIELD | Admitting: Advanced Practice Midwife

## 2015-01-16 ENCOUNTER — Encounter: Payer: Self-pay | Admitting: Advanced Practice Midwife

## 2015-01-16 NOTE — Progress Notes (Signed)
  Tina Mckee is a 24 y.o. who presents for a postpartum visit. She is 4 weeks postpartum following a spontaneous vaginal delivery. I have fully reviewed the prenatal and intrapartum course. The delivery was at 38.2 gestational weeks. She underwent IOL for severe PreX. She did not have to start antihypertensives Anesthesia: epidural. Postpartum course has been uneventful. Baby's course has been uneventful. Baby is feeding by breast;  Baby favors one breast over the other. Pt produces ~ 300cc when she pumps.  Tips given. Bleeding: no bleeding. Bowel function is normal. Bladder function is normal. Patient is not sexually active. Contraception method is none. Postpartum depression screening: negative. Wants to go back to work (weekends only)  Current outpatient prescriptions:  .  albuterol (PROVENTIL HFA;VENTOLIN HFA) 108 (90 BASE) MCG/ACT inhaler, Inhale 2 puffs into the lungs every 4 (four) hours as needed for wheezing or shortness of breath., Disp: 1 Inhaler, Rfl: 2 .  Prenatal Vit-Fe Fumarate-FA (PRENATAL MULTIVITAMIN) TABS tablet, Take 1 tablet by mouth daily at 12 noon., Disp: , Rfl:   Review of Systems   Constitutional: Negative for fever and chills Eyes: Negative for visual disturbances Respiratory: Negative for shortness of breath, dyspnea Cardiovascular: Negative for chest pain or palpitations  Gastrointestinal: Negative for vomiting, diarrhea and constipation Genitourinary: Negative for dysuria and urgency Musculoskeletal: Negative for back pain, joint pain, myalgias  Neurological: Negative for dizziness and headaches   Objective:     Filed Vitals:   01/16/15 0854  BP: 128/94   General:  alert, cooperative and no distress   Breasts:  negative  Lungs: clear to auscultation bilaterally  Heart:  regular rate and rhythm  Abdomen: Soft, nontender   Vulva:  normal  Vagina: normal vagina  Cervix:  closed  Corpus: Well involuted     Rectal Exam: no hemorrhoids         Assessment:    normal postpartum exam. Preeclampsia, resolved  Plan:    1. Contraception: paragard 2. Follow up in: 2 weeks or as needed.

## 2015-02-05 ENCOUNTER — Ambulatory Visit: Payer: BLUE CROSS/BLUE SHIELD | Admitting: Advanced Practice Midwife

## 2015-02-12 ENCOUNTER — Encounter: Payer: Self-pay | Admitting: Advanced Practice Midwife

## 2015-02-12 ENCOUNTER — Ambulatory Visit (INDEPENDENT_AMBULATORY_CARE_PROVIDER_SITE_OTHER): Payer: BLUE CROSS/BLUE SHIELD | Admitting: Advanced Practice Midwife

## 2015-02-12 VITALS — BP 110/70 | Ht 62.0 in | Wt 180.0 lb

## 2015-02-12 DIAGNOSIS — Z3200 Encounter for pregnancy test, result unknown: Secondary | ICD-10-CM

## 2015-02-12 DIAGNOSIS — Z30430 Encounter for insertion of intrauterine contraceptive device: Secondary | ICD-10-CM

## 2015-02-12 DIAGNOSIS — Z3202 Encounter for pregnancy test, result negative: Secondary | ICD-10-CM

## 2015-02-12 LAB — POCT URINE PREGNANCY: Preg Test, Ur: NEGATIVE

## 2015-02-12 NOTE — Progress Notes (Addendum)
Tina Mckee is a 24 y.o. year old Caucasian female Gravida 2 Para 2  who presents for placement of a Paragard IUD. She is breastfeeding, has not had intercourse since delivery, and her pregnancy test today is negative.    The risks and benefits of the method and placement have been thouroughly reviewed with the patient and all questions were answered.  Specifically the patient is aware of failure rate of 12/998, expulsion of the IUD and of possible perforation.    Time out was performed.  A Graves speculum was placed.  The cervix was prepped using Betadine. The uterus was found to be retroflexed and it sounded to 8 cm.  The cervix was grasped with a tenaculum and the IUD was inserted to 8 cm.  It was pulled back 1 cm and the IUD was disengaged.  The strings were trimmed to 3 cm.  Sonogram was performed and the proper placement of the IUD was verified.  The patient was instructed on signs and symptoms of infection and to check for the strings after each menses or each month.  The patient is to refrain from intercourse for 3 days.  The patient is scheduled for a return appointment after her first menses or 4 weeks.  CRESENZO-DISHMAN,Hoyt Leanos 02/12/2015 3:19 PM

## 2015-02-12 NOTE — Assessment & Plan Note (Signed)
paragard 02/12/2015

## 2015-02-20 ENCOUNTER — Ambulatory Visit (INDEPENDENT_AMBULATORY_CARE_PROVIDER_SITE_OTHER): Payer: BLUE CROSS/BLUE SHIELD | Admitting: Internal Medicine

## 2015-02-20 ENCOUNTER — Encounter: Payer: Self-pay | Admitting: Internal Medicine

## 2015-02-20 VITALS — BP 110/76 | Temp 98.4°F | Ht 62.0 in | Wt 189.7 lb

## 2015-02-20 DIAGNOSIS — Z0000 Encounter for general adult medical examination without abnormal findings: Secondary | ICD-10-CM

## 2015-02-20 DIAGNOSIS — G8190 Hemiplegia, unspecified affecting unspecified side: Secondary | ICD-10-CM | POA: Diagnosis not present

## 2015-02-20 DIAGNOSIS — R739 Hyperglycemia, unspecified: Secondary | ICD-10-CM

## 2015-02-20 DIAGNOSIS — E785 Hyperlipidemia, unspecified: Secondary | ICD-10-CM | POA: Insufficient documentation

## 2015-02-20 DIAGNOSIS — Z9181 History of falling: Secondary | ICD-10-CM | POA: Diagnosis not present

## 2015-02-20 DIAGNOSIS — G8191 Hemiplegia, unspecified affecting right dominant side: Secondary | ICD-10-CM

## 2015-02-20 DIAGNOSIS — Z1331 Encounter for screening for depression: Secondary | ICD-10-CM | POA: Insufficient documentation

## 2015-02-20 DIAGNOSIS — L819 Disorder of pigmentation, unspecified: Secondary | ICD-10-CM | POA: Diagnosis not present

## 2015-02-20 NOTE — Patient Instructions (Addendum)
Suggests getting back with orthopedics and or sports medicine to help with gait and adivse on afoot support to halep avoid falling  ? If physical therapy would help?  Avoid sugars to prevent getting diabetes . Get copy of screening labs that include blood sugar and cholesterol levels  Will be contacted about dermatology consult  Can take pix of moles   Healthy lifestyle includes : At least 150 minutes of exercise weeks  , weight at healthy levels, which is usually   BMI 19-25. Avoid trans fats and processed foods;  Increase fresh fruits and veges to 5 servings per day. And avoid sweet beverages including tea and juice. Mediterranean diet with olive oil and nuts have been noted to be heart and brain healthy . Avoid tobacco products . Limit  alcohol to  7 per week for women and 14 servings for men.  Get adequate sleep . Wear seat belts . Don't text and drive .      Why follow it? Research shows. . Those who follow the Mediterranean diet have a reduced risk of heart disease  . The diet is associated with a reduced incidence of Parkinson's and Alzheimer's diseases . People following the diet may have longer life expectancies and lower rates of chronic diseases  . The Dietary Guidelines for Americans recommends the Mediterranean diet as an eating plan to promote health and prevent disease  What Is the Mediterranean Diet?  . Healthy eating plan based on typical foods and recipes of Mediterranean-style cooking . The diet is primarily a plant based diet; these foods should make up a majority of meals   Starches - Plant based foods should make up a majority of meals - They are an important sources of vitamins, minerals, energy, antioxidants, and fiber - Choose whole grains, foods high in fiber and minimally processed items  - Typical grain sources include wheat, oats, barley, corn, brown rice, bulgar, farro, millet, polenta, couscous  - Various types of beans include chickpeas, lentils, fava  beans, black beans, white beans   Fruits  Veggies - Large quantities of antioxidant rich fruits & veggies; 6 or more servings  - Vegetables can be eaten raw or lightly drizzled with oil and cooked  - Vegetables common to the traditional Mediterranean Diet include: artichokes, arugula, beets, broccoli, brussel sprouts, cabbage, carrots, celery, collard greens, cucumbers, eggplant, kale, leeks, lemons, lettuce, mushrooms, okra, onions, peas, peppers, potatoes, pumpkin, radishes, rutabaga, shallots, spinach, sweet potatoes, turnips, zucchini - Fruits common to the Mediterranean Diet include: apples, apricots, avocados, cherries, clementines, dates, figs, grapefruits, grapes, melons, nectarines, oranges, peaches, pears, pomegranates, strawberries, tangerines  Fats - Replace butter and margarine with healthy oils, such as olive oil, canola oil, and tahini  - Limit nuts to no more than a handful a day  - Nuts include walnuts, almonds, pecans, pistachios, pine nuts  - Limit or avoid candied, honey roasted or heavily salted nuts - Olives are central to the [employer] - can be eaten whole or used in a variety of dishes   Meats Protein - Limiting red meat: no more than a few times a month - When eating red meat: choose lean cuts and keep the portion to the size of deck of cards - Eggs: approx. 0 to 4 times a week  - Fish and lean poultry: at least 2 a week  - Healthy protein sources include, chicken, [place], lean beef, lamb - Increase intake of seafood such as tuna, salmon, trout, mackerel, shrimp, scallops -  Avoid or limit high fat processed meats such as sausage and bacon  Dairy - Include moderate amounts of low fat dairy products  - Focus on healthy dairy such as fat free yogurt, skim milk, low or reduced fat cheese - Limit dairy products higher in fat such as whole or 2% milk, cheese, ice cream  Alcohol - Moderate amounts of red wine is ok  - No more than 5 oz daily for women (all ages) and  men older than age 66  - No more than 10 oz of wine daily for men younger than 45  Other - Limit sweets and other desserts  - Use herbs and spices instead of salt to flavor foods  - Herbs and spices common to the traditional Mediterranean Diet include: basil, bay leaves, chives, cloves, cumin, fennel, garlic, lavender, marjoram, mint, oregano, parsley, pepper, rosemary, sage, savory, sumac, tarragon, thyme   It's not just a diet, it's a lifestyle:  . The Mediterranean diet includes lifestyle factors typical of those in the region  . Foods, drinks and meals are best eaten with others and savored . Daily physical activity is important for overall good health . This could be strenuous exercise like running and aerobics . This could also be more leisurely activities such as walking, housework, yard-work, or taking the stairs . Moderation is the key; a balanced and healthy diet accommodates most foods and drinks . Consider portion sizes and frequency of consumption of certain foods   Meal Ideas & Options:  . Breakfast:  o Whole wheat toast or whole wheat English muffins with peanut butter & hard boiled egg o Steel cut oats topped with apples & cinnamon and skim milk  o Fresh fruit: banana, strawberries, melon, berries, peaches  o Smoothies: strawberries, bananas, greek yogurt, peanut butter o Low fat greek yogurt with blueberries and granola  o Egg white omelet with spinach and mushrooms o Breakfast couscous: whole wheat couscous, apricots, skim milk, cranberries  . Sandwiches:  o Hummus and grilled vegetables (peppers, zucchini, squash) on whole wheat bread   o Grilled chicken on whole wheat pita with lettuce, tomatoes, cucumbers or tzatziki  o Tuna salad on whole wheat bread: tuna salad made with greek yogurt, olives, red peppers, capers, green onions o Garlic rosemary lamb pita: lamb sauted with garlic, rosemary, salt & pepper; add lettuce, cucumber, greek yogurt to pita - flavor with  lemon juice and black pepper  . Seafood:  o Mediterranean grilled salmon, seasoned with garlic, basil, parsley, lemon juice and black pepper o Shrimp, lemon, and spinach whole-grain pasta salad made with low fat greek yogurt  o Seared scallops with lemon orzo  o Seared tuna steaks seasoned salt, pepper, coriander topped with tomato mixture of olives, tomatoes, olive oil, minced garlic, parsley, green onions and cappers  . Meats:  o Herbed greek chicken salad with kalamata olives, cucumber, feta  o Red bell peppers stuffed with spinach, bulgur, lean ground beef (or lentils) & topped with feta   o Kebabs: skewers of chicken, tomatoes, onions, zucchini, squash  o [place] burgers: made with red onions, mint, dill, lemon juice, feta cheese topped with roasted red peppers . Vegetarian o Cucumber salad: cucumbers, artichoke hearts, celery, red onion, feta cheese, tossed in olive oil & lemon juice  o Hummus and whole grain pita points with a greek salad (lettuce, tomato, feta, olives, cucumbers, red onion) o Lentil soup with celery, carrots made with vegetable broth, garlic, salt and pepper  o Tabouli salad:  parsley, bulgur, mint, scallions, cucumbers, tomato, radishes, lemon juice, olive oil, salt and pepper.

## 2015-02-20 NOTE — Progress Notes (Signed)
Pre visit review using our clinic review tool, if applicable. No additional management support is needed unless otherwise documented below in the visit note.  Chief Complaint  Patient presents with  . Annual Exam    HPI: Patient  Tina Mckee  24 y.o. comes in today for Preventive Health Care visit   Delivery dec 29 15 healhty baby works 2 days aweek [employer]  Had some elevation bp in preg ok now . No dm Doing well but has had 2 falls in last 6 months  Fell  sunday deck was slipped. As with her.  High top  No brace right foot no residual injury but did have some back problems has history of right leg and hip pain. She is nursing at this time and has her 2-1/37-month-old infant with her looks healthy  Health Maintenance  Topic Date Due  . INFLUENZA VACCINE  07/15/2015  . PAP SMEAR  09/29/2016  . TETANUS/TDAP  08/02/2021  . HIV Screening  Completed   Health Maintenance Review LIFESTYLE:  Exercise:   Work up and down not formal Tobacco/ETS:no Alcohol: per day no Sugar beverages:downb to one ocass Sleep:6-7 hours  Drug use: no HHof 42 pet dog 66 year old and infant  ROS:  GEN/ HEENT: No fever, significant weight changes sweats headaches vision problems hearing changes, CV/ PULM; No chest pain shortness of breath cough, syncope,edema  change in exercise tolerance. GI /GU: No adominal pain, vomiting, change in bowel habits. No blood in the stool. No significant GU symptoms. SKIN/HEME: ,no acute skin rashes suspicious lesions or bleeding. No lymphadenopathy, nodules, masses.  NEURO/ PSYCH:  No regression of neurologic signs has right hemiparesis.. No depression anxiety. IMM/ Allergy: No unusual infections.  Allergy .   REST of 12 system review negative except as per HPI   Past Medical History  Diagnosis Date  . Stroke birth    rt hemiparesis  Perinatal  . Scoliosis   . Perinatal arterial ischemic stroke   . Gestational hypertension   . Supervision of other normal  pregnancy 05/04/2014  . Asthma     Past Surgical History  Procedure Laterality Date  . Foot surgery      rt foot  . Foot surgery      Bone placed    Family History  Problem Relation Age of Onset  . Hypertension Mother   . Asthma Mother   . Hypertension Father   . Asthma Sister   . Cancer Maternal Grandfather     pancreatic  . Cancer Paternal Grandmother     cancer all over  . Other Paternal Grandfather     brain damage    History   Social History  . Marital Status: Single    Spouse Name: N/A  . Number of Children: N/A  . Years of Education: N/A   Social History Main Topics  . Smoking status: Former Smoker    Types: Cigarettes    Quit date: 07/11/2007  . Smokeless tobacco: Never Used  . Alcohol Use: No     Comment: prior to pregnancy  . Drug Use: No  . Sexual Activity: Not Currently    Birth Control/ Protection: None   Other Topics Concern  . 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  5 days per week 2 days off .     Currently nonsmoking  HH of 3 no pets     had mild preeclampsia with vaginal childbirth.  2011   ocass etoh.    Exercise  Walking     Sleep ok    caffiene limited per day .                 Outpatient Encounter Prescriptions as of 02/20/2015  Medication Sig  . Misc Natural Products (PARA-GARD PO) Take by mouth.  . Prenatal Vit-Fe Fumarate-FA (PRENATAL MULTIVITAMIN) TABS tablet Take 1 tablet by mouth daily at 12 noon.  Marland Kitchen albuterol (PROVENTIL HFA;VENTOLIN HFA) 108 (90 BASE) MCG/ACT inhaler Inhale 2 puffs into the lungs every 4 (four) hours as needed for wheezing or shortness of breath. (Patient not taking: Reported on 02/20/2015)    EXAM:  BP 110/76 mmHg  Temp(Src) 98.4 F (36.9 C) (Oral)  Ht 5\' 2"  (1.575 m)  Wt 189 lb 11.2 oz (86.047 kg)  BMI 34.69 kg/m2  Body mass index is 34.69 kg/(m^2).  Physical Exam: Vital signs reviewed WUJ:WJXB is a  well-developed well-nourished alert cooperative    who appearsr stated age in no acute distress.  With obv hemiparesis ambulatory  Rue more than RLE  HEENT: normocephalic atraumatic , Eyes: PERRL EOM's full, conjunctiva clear, Nares: paten,t no deformity discharge or tenderness., Ears: no deformity EAC's clear TMs with normal landmarks. Mouth: clear OP, no lesions, edema.  Moist mucous membranes. Dentition in adequate repair. NECK: supple without masses, thyromegaly or bruits. CHEST/PULM:  Clear to auscultation and percussion breath sounds equal no wheeze , rales or rhonchi. No chest wall deformities or tenderness. Breast: normal by inspection . No dimpling, discharge, masses, tenderness or discharge .nursing mom  CV: PMI is nondisplaced, S1 S2 no gallops, murmurs, rubs. Peripheral pulses are full without delay.No JVD .  ABDOMEN: Bowel sounds normal nontender  No guard or rebound, no hepato splenomegal no CVA tenderness.  No hernia. Extremtities:  No clubbing cyanosis or edema, no acute joint swelling or redness no focal atrophy right foot some spasticiy  clonus  Right ue hemiparesis   NEURO:  Oriented x3, cranial nerves 3-12 appear to be intact, see above l SKIN: No acute rashes normal turgor, color, no bruising or petechiae. Multiple moles back one on breast and foot  2 dark moole  one 3 mm center  dark sight  Irregular  PSYCH: Oriented, good eye contact, no obvious depression anxiety, cognition and judgment appear normal. LN: no cervical axillary inguinal adenopathy  Lab Results  Component Value Date   WBC 21.8* 12/14/2014   HGB 11.6* 12/14/2014   HCT 33.9* 12/14/2014   PLT 257 12/14/2014   GLUCOSE 104* 12/14/2014   CHOL 166 01/18/2013   TRIG 272.0* 01/18/2013   HDL 26.80* 01/18/2013   LDLDIRECT 104.9 01/18/2013   LDLCALC 82 08/03/2011   ALT 13 12/14/2014   AST 18 12/14/2014   NA 138 12/14/2014   K 3.5 12/14/2014   CL 107 12/14/2014   CREATININE <0.30* 12/14/2014   BUN 9 12/14/2014     CO2 25 12/14/2014   TSH 1.099 05/04/2014   HGBA1C 5.3 01/18/2013   MICROALBUR 0.9 09/20/2012    ASSESSMENT AND PLAN:  Discussed the following assessment and plan:  Visit for preventive health examination - utd    Hemiparesis, right - hx of falling using high top instead of support afo alos right hip pain get back with otho of sm physical med   Hyperglycemia - no diabetes in preg  lsi get updated labs  Hyperlipidemia - will get labs via [employer] work   Pigmented skin lesion of uncertain nature - many moles and one dark and 2 colored on lower back get derm to see all moles  - Plan: Ambulatory referral to Dermatology  Hx of fall Counseled about prevention dietary lifestyle exercise for diabetes prevention Patient Care Team: Burnis Medin, MD as PCP - General Jodi Geralds, MD (Neurology) Jonnie Kind, MD (Obstetrics and Gynecology) Marybelle Killings, MD (Orthopedic Surgery) wong (Neurology) Patient Instructions   Suggests getting back with orthopedics and or sports medicine to help with gait and adivse on afoot support to halep avoid falling  ? If physical therapy would help?  Avoid sugars to prevent getting diabetes . Get copy of screening labs that include blood sugar and cholesterol levels  Will be contacted about dermatology consult  Can take pix of moles   Healthy lifestyle includes : At least 150 minutes of exercise weeks  , weight at healthy levels, which is usually   BMI 19-25. Avoid trans fats and processed foods;  Increase fresh fruits and veges to 5 servings per day. And avoid sweet beverages including tea and juice. Mediterranean diet with olive oil and nuts have been noted to be heart and brain healthy . Avoid tobacco products . Limit  alcohol to  7 per week for women and 14 servings for men.  Get adequate sleep . Wear seat belts . Don't text and drive .      Why follow it? Research shows. . Those who follow the Mediterranean diet have a reduced risk of  heart disease  . The diet is associated with a reduced incidence of Parkinson's and Alzheimer's diseases . People following the diet may have longer life expectancies and lower rates of chronic diseases  . The Dietary Guidelines for Americans recommends the Mediterranean diet as an eating plan to promote health and prevent disease  What Is the Mediterranean Diet?  . Healthy eating plan based on typical foods and recipes of Mediterranean-style cooking . The diet is primarily a plant based diet; these foods should make up a majority of meals   Starches - Plant based foods should make up a majority of meals - They are an important sources of vitamins, minerals, energy, antioxidants, and fiber - Choose whole grains, foods high in fiber and minimally processed items  - Typical grain sources include wheat, oats, barley, corn, brown rice, bulgar, farro, millet, polenta, couscous  - Various types of beans include chickpeas, lentils, fava beans, black beans, white beans   Fruits  Veggies - Large quantities of antioxidant rich fruits & veggies; 6 or more servings  - Vegetables can be eaten raw or lightly drizzled with oil and cooked  - Vegetables common to the traditional Mediterranean Diet include: artichokes, arugula, beets, broccoli, brussel sprouts, cabbage, carrots, celery, collard greens, cucumbers, eggplant, kale, leeks, lemons, lettuce, mushrooms, okra, onions, peas, peppers, potatoes, pumpkin, radishes, rutabaga, shallots, spinach, sweet potatoes, turnips, zucchini - Fruits common to the Mediterranean Diet include: apples, apricots, avocados, cherries, clementines, dates, figs, grapefruits, grapes, melons, nectarines, oranges, peaches, pears, pomegranates, strawberries, tangerines  Fats - Replace butter and margarine with healthy oils, such as olive oil, canola oil, and tahini  - Limit nuts to no more than a handful a day  - Nuts include walnuts, almonds, pecans, pistachios, pine nuts  - Limit or  avoid candied, honey roasted or heavily salted nuts - Olives are central to the Mediterranean diet - can  be eaten whole or used in a variety of dishes   Meats Protein - Limiting red meat: no more than a few times a month - When eating red meat: choose lean cuts and keep the portion to the size of deck of cards - Eggs: approx. 0 to 4 times a week  - Fish and lean poultry: at least 2 a week  - Healthy protein sources include, chicken, [place], lean beef, lamb - Increase intake of seafood such as tuna, salmon, trout, mackerel, shrimp, scallops - Avoid or limit high fat processed meats such as sausage and bacon  Dairy - Include moderate amounts of low fat dairy products  - Focus on healthy dairy such as fat free yogurt, skim milk, low or reduced fat cheese - Limit dairy products higher in fat such as whole or 2% milk, cheese, ice cream  Alcohol - Moderate amounts of red wine is ok  - No more than 5 oz daily for women (all ages) and men older than age 73  - No more than 10 oz of wine daily for men younger than 33  Other - Limit sweets and other desserts  - Use herbs and spices instead of salt to flavor foods  - Herbs and spices common to the traditional Mediterranean Diet include: basil, bay leaves, chives, cloves, cumin, fennel, garlic, lavender, marjoram, mint, oregano, parsley, pepper, rosemary, sage, savory, sumac, tarragon, thyme   It's not just a diet, it's a lifestyle:  . The Mediterranean diet includes lifestyle factors typical of those in the region  . Foods, drinks and meals are best eaten with others and savored . Daily physical activity is important for overall good health . This could be strenuous exercise like running and aerobics . This could also be more leisurely activities such as walking, housework, yard-work, or taking the stairs . Moderation is the key; a balanced and healthy diet accommodates most foods and drinks . Consider portion sizes and frequency of consumption of  certain foods   Meal Ideas & Options:  . Breakfast:  o Whole wheat toast or whole wheat English muffins with peanut butter & hard boiled egg o Steel cut oats topped with apples & cinnamon and skim milk  o Fresh fruit: banana, strawberries, melon, berries, peaches  o Smoothies: strawberries, bananas, greek yogurt, peanut butter o Low fat greek yogurt with blueberries and granola  o Egg white omelet with spinach and mushrooms o Breakfast couscous: whole wheat couscous, apricots, skim milk, cranberries  . Sandwiches:  o Hummus and grilled vegetables (peppers, zucchini, squash) on whole wheat bread   o Grilled chicken on whole wheat pita with lettuce, tomatoes, cucumbers or tzatziki  o Tuna salad on whole wheat bread: tuna salad made with greek yogurt, olives, red peppers, capers, green onions o Garlic rosemary lamb pita: lamb sauted with garlic, rosemary, salt & pepper; add lettuce, cucumber, greek yogurt to pita - flavor with lemon juice and black pepper  . Seafood:  o Mediterranean grilled salmon, seasoned with garlic, basil, parsley, lemon juice and black pepper o Shrimp, lemon, and spinach whole-grain pasta salad made with low fat greek yogurt  o Seared scallops with lemon orzo  o Seared tuna steaks seasoned salt, pepper, coriander topped with tomato mixture of olives, tomatoes, olive oil, minced garlic, parsley, green onions and cappers  . Meats:  o Herbed greek chicken salad with kalamata olives, cucumber, feta  o Red bell peppers stuffed with spinach, bulgur, lean ground beef (or lentils) & topped  with feta   o Kebabs: skewers of chicken, tomatoes, onions, zucchini, squash  o [place] burgers: made with red onions, mint, dill, lemon juice, feta cheese topped with roasted red peppers . Vegetarian o Cucumber salad: cucumbers, artichoke hearts, celery, red onion, feta cheese, tossed in olive oil & lemon juice  o Hummus and whole grain pita points with a greek salad (lettuce, tomato, feta,  olives, cucumbers, red onion) o Lentil soup with celery, carrots made with vegetable broth, garlic, salt and pepper  o Tabouli salad: parsley, bulgur, mint, scallions, cucumbers, tomato, radishes, lemon juice, olive oil, salt and pepper.          Standley Brooking. Juliette Standre M.D.

## 2015-03-08 ENCOUNTER — Other Ambulatory Visit: Payer: Self-pay

## 2015-03-20 ENCOUNTER — Ambulatory Visit (INDEPENDENT_AMBULATORY_CARE_PROVIDER_SITE_OTHER): Payer: BLUE CROSS/BLUE SHIELD | Admitting: Advanced Practice Midwife

## 2015-03-20 ENCOUNTER — Encounter: Payer: Self-pay | Admitting: Advanced Practice Midwife

## 2015-03-20 VITALS — BP 118/80 | HR 64 | Ht 61.0 in | Wt 188.0 lb

## 2015-03-20 DIAGNOSIS — N898 Other specified noninflammatory disorders of vagina: Secondary | ICD-10-CM | POA: Diagnosis not present

## 2015-03-20 DIAGNOSIS — Z30431 Encounter for routine checking of intrauterine contraceptive device: Secondary | ICD-10-CM

## 2015-03-20 NOTE — Progress Notes (Signed)
[facility] Visit  Patient name: Tina Mckee MRN [id number]  Date of birth: [birth year]  CC & HPI:  Tina Mckee is a 24 y.o. Caucasian female presenting today for IUD check.  She had a paragard placed a month ago, 6 weeks after an uncomplicated SVD.  She is still breastfeeding, so she has not had a period.  Has tried, but is unable, to find strings d/t cx being posterior.  Husband cannot feel them during sex.   Pertinent History Reviewed:  Medical & Surgical Hx:   Past Medical History  Diagnosis Date  . Stroke birth    rt hemiparesis  Perinatal  . Scoliosis   . Perinatal arterial ischemic stroke   . Gestational hypertension   . Supervision of other normal pregnancy 05/04/2014  . Asthma    Past Surgical History  Procedure Laterality Date  . Foot surgery      rt foot  . Foot surgery      Bone placed  . Moles removed      from back   Family History  Problem Relation Age of Onset  . Hypertension Mother   . Asthma Mother   . Hypertension Father   . Asthma Sister   . Cancer Maternal Grandfather     pancreatic  . Cancer Paternal Grandmother     cancer all over  . Other Paternal Grandfather     brain damage  . Other Maternal Grandmother     heart problems    Current outpatient prescriptions:  .  albuterol (PROVENTIL HFA;VENTOLIN HFA) 108 (90 BASE) MCG/ACT inhaler, Inhale 2 puffs into the lungs every 4 (four) hours as needed for wheezing or shortness of breath., Disp: 1 Inhaler, Rfl: 2 .  Misc Natural Products (PARA-GARD PO), Take by mouth., Disp: , Rfl:  .  Prenatal Vit-Fe Fumarate-FA (PRENATAL MULTIVITAMIN) TABS tablet, Take 1 tablet by mouth daily at 12 noon., Disp: , Rfl:  Social History: Reviewed -  reports that she quit smoking about 7 years ago. Her smoking use included Cigarettes. She has never used smokeless tobacco.  Review of Systems:   Constitutional: Negative for fever and chill  Gastrointestinal: Negative for vomiting, diarrhea and  constipation; no abdominal pain Genitourinary: Negative for dysuria and urgency, vaginal irritation or itching    Objective Findings:  Vitals: BP 118/80 mmHg  Pulse 64  Ht 5\' 1"  (1.549 m)  Wt 188 lb (85.276 kg)  BMI 35.54 kg/m2  Breastfeeding? Yes  Physical Examination: General appearance - alert, well appearing, and in no distress Abdomen - Soft, nontender Pelvic - Vulva:  Normal in appearance Vagina:  Thin white discharge with slight odor.  Strings visible, wrapped around cx.  Cervix: nonfriable Wet Prep:  No clue, trich, or yeast.  Scant WBC Neurological - Some right sided weakness from stroke Extremities - no pedal edema noted     Assessment & Plan:  A:   IUD check, normal exam  No evidence of vaginal infection P:  Continue to try to check strings   F/U prn   CRESENZO-DISHMAN,Leland Raver CNM 03/20/2015 9:15 AM

## 2015-04-04 ENCOUNTER — Encounter: Payer: Self-pay | Admitting: Internal Medicine

## 2015-05-16 ENCOUNTER — Ambulatory Visit: Payer: Self-pay | Admitting: Internal Medicine

## 2015-12-18 ENCOUNTER — Encounter: Payer: Self-pay | Admitting: *Deleted

## 2015-12-19 ENCOUNTER — Encounter: Payer: Self-pay | Admitting: Internal Medicine

## 2015-12-19 ENCOUNTER — Encounter: Payer: Self-pay | Admitting: *Deleted

## 2015-12-19 ENCOUNTER — Ambulatory Visit (INDEPENDENT_AMBULATORY_CARE_PROVIDER_SITE_OTHER): Payer: BLUE CROSS/BLUE SHIELD | Admitting: Internal Medicine

## 2015-12-19 VITALS — BP 130/100 | Temp 98.3°F | Wt 207.0 lb

## 2015-12-19 DIAGNOSIS — H538 Other visual disturbances: Secondary | ICD-10-CM

## 2015-12-19 DIAGNOSIS — R51 Headache: Secondary | ICD-10-CM | POA: Diagnosis not present

## 2015-12-19 DIAGNOSIS — D649 Anemia, unspecified: Secondary | ICD-10-CM | POA: Diagnosis not present

## 2015-12-19 DIAGNOSIS — R519 Headache, unspecified: Secondary | ICD-10-CM

## 2015-12-19 DIAGNOSIS — R739 Hyperglycemia, unspecified: Secondary | ICD-10-CM

## 2015-12-19 DIAGNOSIS — Z23 Encounter for immunization: Secondary | ICD-10-CM | POA: Diagnosis not present

## 2015-12-19 DIAGNOSIS — Z8673 Personal history of transient ischemic attack (TIA), and cerebral infarction without residual deficits: Secondary | ICD-10-CM

## 2015-12-19 LAB — CBC WITH DIFFERENTIAL/PLATELET
BASOS ABS: 0 10*3/uL (ref 0.0–0.1)
Basophils Relative: 0.4 % (ref 0.0–3.0)
Eosinophils Absolute: 0.4 10*3/uL (ref 0.0–0.7)
Eosinophils Relative: 3.7 % (ref 0.0–5.0)
HEMATOCRIT: 46.4 % — AB (ref 36.0–46.0)
HEMOGLOBIN: 15.7 g/dL — AB (ref 12.0–15.0)
Lymphocytes Relative: 27.7 % (ref 12.0–46.0)
Lymphs Abs: 2.9 10*3/uL (ref 0.7–4.0)
MCHC: 33.8 g/dL (ref 30.0–36.0)
MCV: 85.4 fl (ref 78.0–100.0)
Monocytes Absolute: 0.6 10*3/uL (ref 0.1–1.0)
Monocytes Relative: 5.4 % (ref 3.0–12.0)
NEUTROS ABS: 6.5 10*3/uL (ref 1.4–7.7)
Neutrophils Relative %: 62.8 % (ref 43.0–77.0)
PLATELETS: 438 10*3/uL — AB (ref 150.0–400.0)
RBC: 5.43 Mil/uL — ABNORMAL HIGH (ref 3.87–5.11)
RDW: 12.4 % (ref 11.5–15.5)
WBC: 10.4 10*3/uL (ref 4.0–10.5)

## 2015-12-19 LAB — BASIC METABOLIC PANEL
BUN: 20 mg/dL (ref 6–23)
CHLORIDE: 105 meq/L (ref 96–112)
CO2: 26 mEq/L (ref 19–32)
Calcium: 9.6 mg/dL (ref 8.4–10.5)
Creatinine, Ser: 0.46 mg/dL (ref 0.40–1.20)
GFR: 173.6 mL/min (ref >60.00)
Glucose, Bld: 91 mg/dL (ref 70–99)
Potassium: 4.7 mEq/L (ref 3.5–5.1)
Sodium: 139 mEq/L (ref 135–145)

## 2015-12-19 LAB — T4, FREE: Free T4: 0.62 ng/dL (ref 0.60–1.60)

## 2015-12-19 LAB — HEMOGLOBIN A1C: HEMOGLOBIN A1C: 5.1 % (ref 4.6–6.5)

## 2015-12-19 LAB — SEDIMENTATION RATE: Sed Rate: 8 mm/hr (ref 0–22)

## 2015-12-19 LAB — TSH: TSH: 2.35 u[IU]/mL (ref 0.35–4.50)

## 2015-12-19 NOTE — Patient Instructions (Addendum)
These may be   A new type of migraine  But atypical . Could be related to  Menstrual  cycle  Lab today to check for anemia and blood sugar and thyroid.  May benefit  From suppression medication.  At this time  Take 2 aleve for headaches .  Twice a day .  Sleep deprivation  Can trigger headaches .  Avoid excess caffeine.   Do a headache calendar and try to get regular sleep and eating in the interim .  Your bp is up some today check  To make sure below 140/90 .   See  opthalmologic for good eye exam because of  Blurry vision all the time.   Will be contacted about neurology help referral .     Recurrent Migraine Headache A migraine headache is an intense, throbbing pain on one or both sides of your head. Recurrent migraines keep coming back. A migraine can last for 30 minutes to several hours. CAUSES  The exact cause of a migraine headache is not always known. However, a migraine may be caused when nerves in the brain become irritated and release chemicals that cause inflammation. This causes pain. Certain things may also trigger migraines, such as:   Alcohol.  Smoking.  Stress.  Menstruation.  Aged cheeses.  Foods or drinks that contain nitrates, glutamate, aspartame, or tyramine.  Lack of sleep.  Chocolate.  Caffeine.  Hunger.  Physical exertion.  Fatigue.  Medicines used to treat chest pain (nitroglycerine), birth control pills, estrogen, and some blood pressure medicines. SYMPTOMS   Pain on one or both sides of your head.  Pulsating or throbbing pain.  Severe pain that prevents daily activities.  Pain that is aggravated by any physical activity.  Nausea, vomiting, or both.  Dizziness.  Pain with exposure to bright lights, loud noises, or activity.  General sensitivity to bright lights, loud noises, or smells. Before you get a migraine, you may get warning signs that a migraine is coming (aura). An aura may include:  Seeing flashing lights.  Seeing  bright spots, halos, or zigzag lines.  Having tunnel vision or blurred vision.  Having feelings of numbness or tingling.  Having trouble talking.  Having muscle weakness. DIAGNOSIS  A recurrent migraine headache is often diagnosed based on:  Symptoms.  Physical examination.  A CT scan or MRI of your head. These imaging tests cannot diagnose migraines but can help rule out other causes of headaches.  TREATMENT  Medicines may be given for pain and nausea. Medicines can also be given to help prevent recurrent migraines. HOME CARE INSTRUCTIONS  Only take over-the-counter or prescription medicines for pain or discomfort as directed by your health care provider. The use of long-term narcotics is not recommended.  Lie down in a dark, quiet room when you have a migraine.  Keep a journal to find out what may trigger your migraine headaches. For example, write down:  What you eat and drink.  How much sleep you get.  Any change to your diet or medicines.  Limit alcohol consumption.  Quit smoking if you smoke.  Get 7-9 hours of sleep, or as recommended by your health care provider.  Limit stress.  Keep lights dim if bright lights bother you and make your migraines worse. SEEK MEDICAL CARE IF:   You do not get relief from the medicines given to you.  You have a recurrence of pain.  You have a fever. SEEK IMMEDIATE MEDICAL CARE IF:  Your migraine becomes  severe.  You have a stiff neck.  You have loss of vision.  You have muscular weakness or loss of muscle control.  You start losing your balance or have trouble walking.  You feel faint or pass out.  You have severe symptoms that are different from your first symptoms. MAKE SURE YOU:   Understand these instructions.  Will watch your condition.  Will get help right away if you are not doing well or get worse.   This information is not intended to replace advice given to you by your health care provider. Make  sure you discuss any questions you have with your health care provider.   Document Released: 08/25/2001 Document Revised: 12/21/2014 Document Reviewed: 08/07/2013 Elsevier Interactive Patient Education [employer].

## 2015-12-19 NOTE — Telephone Encounter (Signed)
Pt seen in OV today. 

## 2015-12-19 NOTE — Progress Notes (Signed)
Pre visit review using our clinic review tool, if applicable. No additional management support is needed unless otherwise documented below in the visit note.   Chief Complaint  Patient presents with  . Headache    Has headaches 4 our of 7 days a week with dizziness and blurred vision.  Denies nausea and vomiting.  . Dizziness  . Blurred Vision    HPI: Tina Mckee 25 y.o.  comes in for an acute visit because of increasing frequency of new kind of headache that is sometimes very severe with associated symptoms. Coming on " Ever since   Has paraguard .   " Periods   Cramps bad  2-3 days and then skip sometimes .    Seem worse week o period.. Feels like a Knife in middle of head . The steering early gets photophobia and phonophobia no vomiting has blurred vision no diplopia but the blurred vision occurs almost all the time. She is getting these about 3 times a week or more becoming more severe and debilitating her husband has told her to go the doctor months ago.  Past   Normal  Migraines exceprin  Migraine [place]  When pregnancy .   Had had head injury but not particually from  [place].  No immediate headache after that  bp was not ok in pregnancy    Almost preeclmaped . bp usually  Normal  Since  .  Gets about 5 hours of sleep 42-year-old at home just stopped nursing couldn't take any strong medicine for headaches. Working 12 hour shifts at [employer]. No recent fall  ROS: See pertinent positives and negatives per HPI. No chest pain shortness of breath hasn't been to the neurologist since she saw Dr. Jacelyn Grip at [facility] "get over there. No change in her hemiparesis by history no fever weight loss weight gain. No significant alcohol some caffeine   Past Medical History  Diagnosis Date  . Stroke [facility]) birth    rt hemiparesis  Perinatal  . Scoliosis   . Perinatal arterial ischemic stroke ([place])   . Gestational hypertension   . Supervision of other normal pregnancy 05/04/2014  . Asthma      Family History  Problem Relation Age of Onset  . Hypertension Mother   . Asthma Mother   . Hypertension Father   . Asthma Sister   . Cancer Maternal Grandfather     pancreatic  . Cancer Paternal Grandmother     cancer all over  . Other Paternal Grandfather     brain damage  . Other Maternal Grandmother     heart problems    Social History   Social History  . Marital Status: Single    Spouse Name: N/A  . Number of Children: N/A  . Years of Education: N/A   Social History Main Topics  . Smoking status: Former Smoker    Types: Cigarettes    Quit date: 07/11/2007  . Smokeless tobacco: Never Used  . Alcohol Use: No     Comment: prior to pregnancy  . Drug Use: No  . Sexual Activity: Not Currently    Birth Control/ Protection: IUD   Other Topics Concern  . 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     Currently nonsmoking    HH of 4 no pets     had mild preeclampsia with vaginal  childbirth.  2011   cb 12 15 preeclampsi no complicatinos   ocass etoh.    Exercise  Walking     Sleep ok    caffiene limited per day .                 Outpatient Prescriptions Prior to Visit  Medication Sig Dispense Refill  . albuterol (PROVENTIL HFA;VENTOLIN HFA) 108 (90 BASE) MCG/ACT inhaler Inhale 2 puffs into the lungs every 4 (four) hours as needed for wheezing or shortness of breath. 1 Inhaler 2  . Misc Natural Products (PARA-GARD PO) Take by mouth.    . Prenatal Vit-Fe Fumarate-FA (PRENATAL MULTIVITAMIN) TABS tablet Take 1 tablet by mouth daily at 12 noon.     No facility-administered medications prior to visit.     EXAM:  BP 130/100 mmHg  Temp(Src) 98.3 F (36.8 C) (Oral)  Wt 207 lb (93.895 kg)  Body mass index is 39.13 kg/(m^2).  GENERAL: vitals reviewed and listed above, alert, oriented, appears well hydrated and in no acute distress HEENT: atraumatic, conjunctiva  clear, no  obvious abnormalities on inspection of external nose and ears OP : no lesion edema or exudate  NECK: no obvious masses on inspection palpation  LUNGS: clear to auscultation bilaterally, no wheezes, rales or rhonchi, good air movement CV: HRRR, no clubbing cyanosis or  peripheral edema nl cap refill  MS: moves all extremities without noticeable focal  abnormality PSYCH: pleasant and cooperative, no obvious depression or anxiety   ASSESSMENT AND PLAN:  Discussed the following assessment and plan:  Recurrent headache posterior severe frequent - new onset for over 6 months  more severe and recurring  and type could be atypical migraine for her consdier imaging exam r hemiparesis seems stable   neuro con - Plan: CBC with Differential/Platelet, Basic metabolic panel, TSH, T4, free, Sedimentation rate, Hemoglobin A1c, Ambulatory referral to Neurology  Blurry vision, bilateral - get eye check continual suboptimal fundi exam no he or e edema  - Plan: CBC with Differential/Platelet, Basic metabolic panel, TSH, T4, free, Sedimentation rate, Hemoglobin A1c, Ambulatory referral to Neurology  Hyperglycemia - in past check a1c  - Plan: CBC with Differential/Platelet, Basic metabolic panel, TSH, T4, free, Sedimentation rate, Hemoglobin A1c  Anemia, unspecified anemia type - hx of same around delivery with elevaetd wbc pt concern no fever  recheck today  - Plan: CBC with Differential/Platelet, Basic metabolic panel, TSH, T4, free, Sedimentation rate, Hemoglobin A1c  Need for prophylactic vaccination and inoculation against influenza - Plan: Flu Vaccine QUAD 36+ mos PF IM (Fluarix & Fluzone Quad PF)  History of stroke - from birth  -Patient advised to return or notify health care team  if symptoms worsen ,persist or new concerns arise. Headache calendar given with discussion also calendar her sleep. Neurology referral rule out metabolic issues lifestyle medication short-term use naproxen sodium she's no longer  nursing. Patient Instructions  These may be   A new type of migraine  But atypical . Could be related to  Menstrual  cycle  Lab today to check for anemia and blood sugar and thyroid.  May benefit  From suppression medication.  At this time  Take 2 aleve for headaches .  Twice a day .  Sleep deprivation  Can trigger headaches .  Avoid excess caffeine.   Do a headache calendar and try to get regular sleep and eating in the interim .  Your bp is up some today check  To make sure below 140/90 .  See  opthalmologic for good eye exam because of  Blurry vision all the time.   Will be contacted about neurology help referral .     Recurrent Migraine Headache A migraine headache is an intense, throbbing pain on one or both sides of your head. Recurrent migraines keep coming back. A migraine can last for 30 minutes to several hours. CAUSES  The exact cause of a migraine headache is not always known. However, a migraine may be caused when nerves in the brain become irritated and release chemicals that cause inflammation. This causes pain. Certain things may also trigger migraines, such as:   Alcohol.  Smoking.  Stress.  Menstruation.  Aged cheeses.  Foods or drinks that contain nitrates, glutamate, aspartame, or tyramine.  Lack of sleep.  Chocolate.  Caffeine.  Hunger.  Physical exertion.  Fatigue.  Medicines used to treat chest pain (nitroglycerine), birth control pills, estrogen, and some blood pressure medicines. SYMPTOMS   Pain on one or both sides of your head.  Pulsating or throbbing pain.  Severe pain that prevents daily activities.  Pain that is aggravated by any physical activity.  Nausea, vomiting, or both.  Dizziness.  Pain with exposure to bright lights, loud noises, or activity.  General sensitivity to bright lights, loud noises, or smells. Before you get a migraine, you may get warning signs that a migraine is coming (aura). An aura may  include:  Seeing flashing lights.  Seeing bright spots, halos, or zigzag lines.  Having tunnel vision or blurred vision.  Having feelings of numbness or tingling.  Having trouble talking.  Having muscle weakness. DIAGNOSIS  A recurrent migraine headache is often diagnosed based on:  Symptoms.  Physical examination.  A CT scan or MRI of your head. These imaging tests cannot diagnose migraines but can help rule out other causes of headaches.  TREATMENT  Medicines may be given for pain and nausea. Medicines can also be given to help prevent recurrent migraines. HOME CARE INSTRUCTIONS  Only take over-the-counter or prescription medicines for pain or discomfort as directed by your health care provider. The use of long-term narcotics is not recommended.  Lie down in a dark, quiet room when you have a migraine.  Keep a journal to find out what may trigger your migraine headaches. For example, write down:  What you eat and drink.  How much sleep you get.  Any change to your diet or medicines.  Limit alcohol consumption.  Quit smoking if you smoke.  Get 7-9 hours of sleep, or as recommended by your health care provider.  Limit stress.  Keep lights dim if bright lights bother you and make your migraines worse. SEEK MEDICAL CARE IF:   You do not get relief from the medicines given to you.  You have a recurrence of pain.  You have a fever. SEEK IMMEDIATE MEDICAL CARE IF:  Your migraine becomes severe.  You have a stiff neck.  You have loss of vision.  You have muscular weakness or loss of muscle control.  You start losing your balance or have trouble walking.  You feel faint or pass out.  You have severe symptoms that are different from your first symptoms. MAKE SURE YOU:   Understand these instructions.  Will watch your condition.  Will get help right away if you are not doing well or get worse.   This information is not intended to replace advice  given to you by your health care provider. Make sure you discuss any  questions you have with your health care provider.   Document Released: 08/25/2001 Document Revised: 12/21/2014 Document Reviewed: 08/07/2013 Elsevier Interactive Patient Education 2016 [place] K. Daquane Aguilar M.D.

## 2016-01-14 ENCOUNTER — Ambulatory Visit: Payer: BLUE CROSS/BLUE SHIELD | Admitting: Neurology

## 2016-02-06 ENCOUNTER — Ambulatory Visit (INDEPENDENT_AMBULATORY_CARE_PROVIDER_SITE_OTHER): Payer: BLUE CROSS/BLUE SHIELD | Admitting: Neurology

## 2016-02-06 ENCOUNTER — Encounter: Payer: Self-pay | Admitting: Neurology

## 2016-02-06 VITALS — BP 120/92 | HR 83 | Ht 62.5 in | Wt 214.0 lb

## 2016-02-06 DIAGNOSIS — H8110 Benign paroxysmal vertigo, unspecified ear: Secondary | ICD-10-CM | POA: Diagnosis not present

## 2016-02-06 DIAGNOSIS — R51 Headache: Secondary | ICD-10-CM

## 2016-02-06 DIAGNOSIS — I69351 Hemiplegia and hemiparesis following cerebral infarction affecting right dominant side: Secondary | ICD-10-CM

## 2016-02-06 DIAGNOSIS — R519 Headache, unspecified: Secondary | ICD-10-CM

## 2016-02-06 DIAGNOSIS — M542 Cervicalgia: Secondary | ICD-10-CM | POA: Diagnosis not present

## 2016-02-06 MED ORDER — TIZANIDINE HCL 2 MG PO TABS: 2.0000 mg | ORAL_TABLET | Freq: Four times a day (QID) | ORAL | Status: DC | PRN

## 2016-02-06 MED ORDER — NAPROXEN 500 MG PO TABS: 500.0000 mg | ORAL_TABLET | Freq: Two times a day (BID) | ORAL | Status: DC | PRN

## 2016-02-06 MED ORDER — NORTRIPTYLINE HCL 10 MG PO CAPS: 10.0000 mg | ORAL_CAPSULE | Freq: Every day | ORAL | Status: DC

## 2016-02-06 NOTE — Progress Notes (Signed)
NEUROLOGY CONSULTATION NOTE  Tina Mckee MRN: [id number] DOB: [birth year]  Referring provider: Dr. Regis Bill Primary care provider: Dr. 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  History obtained by patient, prior neurology note and PCP note.  Labs reviewed.  Old MRI imaging reviewed.  In November 2015, while pregnant with her second child, she tripped, falling on her stomach and then hitting the back of her head.  She did not lose consciousness.  She gave birth in December, without complications.  A few months later, she began developing a chronic headache.  It is a stabbing headache in the back of her head, although sometimes she feels it all over her head.  There is associated neck pain.  She also reports vertigo, sensation of spinning which appears positional.  There is no associated nausea or photophobia.  She notes some photophobia.  She also notices "shadows" in the corner of her vision.  She takes Excedrin almost daily, but usually every two days.    She does report migraines, which are different and usually frontal and throbbing.  They are manageable.   She has residual right spastic hemiparesis due to perinatal stroke. She was previously evaluated by a neurologist in 2013 for left sided myoclonus.  MRI of brain from 06/12/12 showed remote left MCA infarct with associated Wallerian degeneration.  Epilepsia partialis continua was initially suspected but EEG did not reveal electrographic correlate.  The movements were determined to be spasms.  She was previously on Keppra and clonazepam, which were discontinued.    PAST MEDICAL HISTORY: Past Medical History  Diagnosis Date  . Stroke [facility]) birth    rt hemiparesis  Perinatal  . Scoliosis   . Perinatal arterial ischemic stroke ([place])   . Gestational hypertension   .  Supervision of other normal pregnancy 05/04/2014  . Asthma     PAST SURGICAL HISTORY: Past Surgical History  Procedure Laterality Date  . Foot surgery      rt foot  . Foot surgery      Bone placed  . Moles removed      from back    MEDICATIONS: Current Outpatient Prescriptions on File Prior to Visit  Medication Sig Dispense Refill  . albuterol (PROVENTIL HFA;VENTOLIN HFA) 108 (90 BASE) MCG/ACT inhaler Inhale 2 puffs into the lungs every 4 (four) hours as needed for wheezing or shortness of breath. (Patient not taking: Reported on 02/06/2016) 1 Inhaler 2   No current facility-administered medications on file prior to visit.    ALLERGIES: Allergies  Allergen Reactions  . Cephalexin Rash    FAMILY HISTORY: Family History  Problem Relation Age of Onset  . Hypertension Mother   . Asthma Mother   . Hypertension Father   . Asthma Sister   . Cancer Maternal Grandfather     pancreatic  . Cancer Paternal Grandmother     cancer all over  . Other Paternal Grandfather     brain damage  . Other Maternal Grandmother     heart problems    SOCIAL HISTORY: Social History   Social History  . Marital Status: Married    Spouse Name: N/A  . Number of Children: 2  . Years of Education: N/A   Occupational History  . Customer Service    Social History Main Topics  . Smoking status: Former Smoker  Types: Cigarettes    Quit date: 07/11/2007  . Smokeless tobacco: Never Used  . Alcohol Use: 0.6 oz/week    1 Standard drinks or equivalent per week     Comment: Every other day  . Drug Use: No  . Sexual Activity: Not Currently    Birth Control/ Protection: IUD   Other Topics Concern  . 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     Currently nonsmoking    HH of 4 no pets     had mild preeclampsia with vaginal childbirth.  2011   cb 12 15 preeclampsi no complicatinos   ocass etoh.     Exercise  Walking     Sleep ok    caffiene limited per day .                 REVIEW OF SYSTEMS: Constitutional: No fevers, chills, or sweats, no generalized fatigue, change in appetite Eyes: No visual changes, double vision, eye pain Ear, nose and throat: No hearing loss, ear pain, nasal congestion, sore throat Cardiovascular: No chest pain, palpitations Respiratory:  No shortness of breath at rest or with exertion, wheezes GastrointestinaI: No nausea, vomiting, diarrhea, abdominal pain, fecal incontinence Genitourinary:  No dysuria, urinary retention or frequency Musculoskeletal:  Neck pain Integumentary: No rash, pruritus, skin lesions Neurological: as above Psychiatric: No depression, insomnia, anxiety Endocrine: No palpitations, fatigue, diaphoresis, mood swings, change in appetite, change in weight, increased thirst Hematologic/Lymphatic:  No anemia, purpura, petechiae. Allergic/Immunologic: no itchy/runny eyes, nasal congestion, recent allergic reactions, rashes  PHYSICAL EXAM: Filed Vitals:   02/06/16 1244  BP: 120/92  Pulse: 83   General: No acute distress.  Patient appears well-groomed.  Head:  Normocephalic/atraumatic Eyes:  fundi unremarkable, without vessel changes, exudates, hemorrhages or papilledema. Neck: supple, bilateral suboccipital and paraspinal tenderness, full range of motion Back: No paraspinal tenderness Heart: regular rate and rhythm Lungs: Clear to auscultation bilaterally. Vascular: No carotid bruits. Neurological Exam: Mental status: alert and oriented to person, place, and time, recent and remote memory intact, fund of knowledge intact, attention and concentration intact, speech fluent and not dysarthric, language intact. Cranial nerves: CN I: not tested CN II: pupils equal, round and reactive to light, visual fields intact, fundi unremarkable, without vessel changes, exudates, hemorrhages or papilledema. CN III, IV, VI:  full range of motion,  no nystagmus, no ptosis CN V: reduced right V1 distribution sensation CN VII: upper and lower face symmetric CN VIII: hearing intact CN IX, X: gag intact, uvula midline CN XI: sternocleidomastoid and trapezius muscles intact CN XII: tongue midline Bulk & Tone: mildly increased right upper extremity tone Motor:  5-/5 right deltoid and triceps and grip.  Otherwise, 5/5 Sensation:  Pinprick sensation decreased in right upper and lower extremities. Vibration sensation mildly decreased on right. Deep Tendon Reflexes:  3+ on right, 2+ on left, toes downgoing.  Finger to nose testing:  Without dysmetria. Athetosis of right hand noted. Heel to shin:  Without dysmetria.  Gait:  Mildly hemiparetic.  Able to turn and tandem walk. Romberg negative.  IMPRESSION: Chronic daily headache, possibly cervicogenic and complicated by medication overuse Probable BPPV Right spastic hemiparesis as late effect of perinatal stroke  PLAN: 1.  Start nortriptyline 10mg  at bedtime.  She is to call in 4 weeks with update and we can increase dose if needed 2.  Tizanidine 2mg  as needed for neck pain.  Caution for drowsiness 3.  MRI of brain to evaluate headache and dizziness in young patient with history of stroke 4.  If MRI unremarkable, would refer to PT for neck pain and/or vestibular rehab 5.  Follow up in 3 months.  Thank you for allowing me to take part in the care of this patient.  Metta Clines, DO  CC:  Shanon Ace, MD

## 2016-02-06 NOTE — Patient Instructions (Signed)
1.  Start nortriptyline 10mg  at bedtime.  Call in 4 weeks with update and we can increase dose if needed 2.  Stop Excedrin.  Instead, try naproxen 500mg , may take every 12 hours as needed but limit to no more than 2 days out of the week 3.  For neck pain, try tizanidine 2mg .  May take every 6 hours as needed.  Caution for drowsiness so you may want to first try it at bedtime and see how you feel 4.  Will get MRI of brain.  If unremarkable, will refer you for physical therapy on the neck 5.  Follow up in 3 to 4 months.

## 2016-02-20 ENCOUNTER — Ambulatory Visit (HOSPITAL_COMMUNITY)
Admission: RE | Admit: 2016-02-20 | Discharge: 2016-02-20 | Disposition: A | Discharge status: Home / Self Care | Payer: BLUE CROSS/BLUE SHIELD | Source: Ambulatory Visit | Attending: Neurology | Admitting: Neurology

## 2016-02-20 DIAGNOSIS — R51 Headache: Secondary | ICD-10-CM | POA: Diagnosis not present

## 2016-02-20 DIAGNOSIS — I69351 Hemiplegia and hemiparesis following cerebral infarction affecting right dominant side: Secondary | ICD-10-CM | POA: Insufficient documentation

## 2016-02-20 DIAGNOSIS — R519 Headache, unspecified: Secondary | ICD-10-CM

## 2016-02-24 ENCOUNTER — Encounter: Payer: Self-pay | Admitting: Neurology

## 2016-02-24 NOTE — Telephone Encounter (Signed)
Please see mychart message.

## 2016-05-13 ENCOUNTER — Ambulatory Visit (INDEPENDENT_AMBULATORY_CARE_PROVIDER_SITE_OTHER)
Admission: RE | Admit: 2016-05-13 | Discharge: 2016-05-13 | Disposition: A | Discharge status: Home / Self Care | Payer: BLUE CROSS/BLUE SHIELD | Source: Ambulatory Visit | Attending: Internal Medicine | Admitting: Internal Medicine

## 2016-05-13 ENCOUNTER — Other Ambulatory Visit: Payer: Self-pay | Admitting: Internal Medicine

## 2016-05-13 ENCOUNTER — Ambulatory Visit (INDEPENDENT_AMBULATORY_CARE_PROVIDER_SITE_OTHER): Payer: BLUE CROSS/BLUE SHIELD | Admitting: Internal Medicine

## 2016-05-13 ENCOUNTER — Encounter: Payer: Self-pay | Admitting: Internal Medicine

## 2016-05-13 VITALS — BP 126/82 | HR 89 | Temp 98.3°F | Ht 62.5 in | Wt 218.2 lb

## 2016-05-13 DIAGNOSIS — J989 Respiratory disorder, unspecified: Secondary | ICD-10-CM

## 2016-05-13 DIAGNOSIS — R509 Fever, unspecified: Principal | ICD-10-CM

## 2016-05-13 DIAGNOSIS — R05 Cough: Secondary | ICD-10-CM

## 2016-05-13 DIAGNOSIS — R1110 Vomiting, unspecified: Secondary | ICD-10-CM | POA: Diagnosis not present

## 2016-05-13 DIAGNOSIS — R059 Cough, unspecified: Secondary | ICD-10-CM

## 2016-05-13 DIAGNOSIS — Z8709 Personal history of other diseases of the respiratory system: Secondary | ICD-10-CM | POA: Diagnosis not present

## 2016-05-13 MED ORDER — AZITHROMYCIN 250 MG PO TABS: ORAL_TABLET | ORAL | Status: DC

## 2016-05-13 MED ORDER — HYDROCODONE-HOMATROPINE 5-1.5 MG/5ML PO SYRP: ORAL_SOLUTION | ORAL | Status: DC

## 2016-05-13 NOTE — Patient Instructions (Addendum)
Get x ray today  Concern about early pneumonia sx  Cough med for comfort . Antibiotic coverage depending on x ray results .   Feverish feeling should be gone in 48 hours .   Fu if  persistent or progressive

## 2016-05-13 NOTE — Progress Notes (Signed)
Chief Complaint  Patient presents with  . Cough    started about 5 days ago     HPI: Tina Mckee 25 y.o. comes in for sda  Day 6 of  Illness  Onset like a cold  And then inc cough   And meds not helpful     Gets coughing fits  And regurgitation with coughing fits .  Incontinence   Onset with fever and still feverish but not document  cholls and sweats at night Tried delsym  Also  No fever ..chills  At onset with fever.  Children  Just started day care some cold issues .  Well otherwise.  ? Feverish . Mucous yesllow green but not much since then.   Some short of breath with inhaler   ?  No Help    ROS: See pertinent positives and negatives per HPI.     Past Medical History  Diagnosis Date  . Stroke [facility]) birth    rt hemiparesis  Perinatal  . Scoliosis   . Perinatal arterial ischemic stroke ([place])   . Gestational hypertension   . Supervision of other normal pregnancy 05/04/2014  . Asthma     Family History  Problem Relation Age of Onset  . Hypertension Mother   . Asthma Mother   . Hypertension Father   . Asthma Sister   . Cancer Maternal Grandfather     pancreatic  . Cancer Paternal Grandmother     cancer all over  . Other Paternal Grandfather     brain damage  . Other Maternal Grandmother     heart problems    Social History   Social History  . Marital Status: Married    Spouse Name: N/A  . Number of Children: 2  . Years of Education: N/A   Occupational History  . Customer Service    Social History Main Topics  . Smoking status: Former Smoker    Types: Cigarettes    Quit date: 07/11/2007  . Smokeless tobacco: Never Used  . Alcohol Use: 0.6 oz/week    1 Standard drinks or equivalent per week     Comment: Every other day  . Drug Use: No  . Sexual Activity: Not Currently    Birth Control/ Protection: IUD   Other Topics Concern  . 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     Currently nonsmoking    HH of 4 no pets     had mild preeclampsia with vaginal childbirth.  2011   cb 12 15 preeclampsi no complicatinos   ocass etoh.    Exercise  Walking     Sleep ok    caffiene limited per day .                 Outpatient Prescriptions Prior to Visit  Medication Sig Dispense Refill  . albuterol (PROVENTIL HFA;VENTOLIN HFA) 108 (90 BASE) MCG/ACT inhaler Inhale 2 puffs into the lungs every 4 (four) hours as needed for wheezing or shortness of breath. 1 Inhaler 2  . levonorgestrel (MIRENA) 20 MCG/24HR IUD 1 each by Intrauterine route once.    . naproxen (NAPROSYN) 500 MG tablet Take 1 tablet (500 mg total) by mouth every 12 (twelve) hours as needed. 20 tablet 2  . nortriptyline (PAMELOR) 10 MG capsule Take 1 capsule (10 mg total) by mouth at bedtime. 30 capsule 3  .  tiZANidine (ZANAFLEX) 2 MG tablet Take 1 tablet (2 mg total) by mouth every 6 (six) hours as needed for muscle spasms. 30 tablet 3   No facility-administered medications prior to visit.     EXAM:  BP 126/82 mmHg  Pulse 89  Temp(Src) 98.3 F (36.8 C) (Oral)  Ht 5' 2.5" (1.588 m)  Wt 218 lb 3 oz (98.969 kg)  BMI 39.25 kg/m2  SpO2 98%  Body mass index is 39.25 kg/(m^2).  GENERAL: vitals reviewed and listed above, alert, oriented, appears well hydrated sick but non toxic  and i serious fits of coughing   No discrete stridor but  Some inhaltation sounds  Bronchial cough  HEENT: atraumatic, conjunctiva  clear, no obvious abnormalities on inspection of external nose and ears tms clear nares min cong OP : no lesion edema or exudate  NECK: no obvious masses on inspection palpation  LUNGS: ? clear to auscultation bilaterally, hard to examine  Seems ok air movements no rales CV: HRRR, no clubbing cyanosis or nl cap refill  MS: moves all extremities  Has right paresis     ASSESSMENT AND PLAN:  Discussed the following assessment and plan:  Febrile respiratory illness - Plan: DG Chest 2  View  Cough - Plan: DG Chest 2 View  Post-tussive emesis  Hx of extrinsic asthma Severity of symptoms and question of nocturnal fever sending for chest x-ray and will probably cover with antibiotic for early bacterial airspace disease. Continue her inhaler if needed cough medicine for comfort expectant management. Exposure to young children. -Patient advised to return or notify health care team  if symptoms worsen ,persist or new concerns arise.  Patient Instructions  Get x ray today  Concern about early pneumonia sx  Cough med for comfort . Antibiotic coverage depending on x ray results .   Feverish feeling should be gone in 48 hours .   Fu if  persistent or progressive      Tina Mckee. Tina Mckee M.D.

## 2016-05-13 NOTE — Progress Notes (Signed)
Pre visit review using our clinic review tool, if applicable. No additional management support is needed unless otherwise documented below in the visit note. 

## 2016-06-02 ENCOUNTER — Ambulatory Visit: Payer: BLUE CROSS/BLUE SHIELD | Admitting: Neurology

## 2016-06-03 DIAGNOSIS — D1801 Hemangioma of skin and subcutaneous tissue: Secondary | ICD-10-CM | POA: Diagnosis not present

## 2016-06-03 DIAGNOSIS — D485 Neoplasm of uncertain behavior of skin: Secondary | ICD-10-CM | POA: Diagnosis not present

## 2016-06-03 DIAGNOSIS — D235 Other benign neoplasm of skin of trunk: Secondary | ICD-10-CM | POA: Diagnosis not present

## 2016-06-03 DIAGNOSIS — L814 Other melanin hyperpigmentation: Secondary | ICD-10-CM | POA: Diagnosis not present

## 2016-06-09 ENCOUNTER — Encounter: Payer: BLUE CROSS/BLUE SHIELD | Admitting: Internal Medicine

## 2016-10-12 NOTE — Progress Notes (Signed)
Pre visit review using our clinic review tool, if applicable. No additional management support is needed unless otherwise documented below in the visit note.  Chief Complaint  Patient presents with  . Annual Exam    HPI: Patient  Tina Mckee  25 y.o. comes in today for Preventive Health Care visit  Here with her small child emma.   Health Maintenance  Topic Date Due  . PAP SMEAR  09/29/2016  . TETANUS/TDAP  08/02/2021  . INFLUENZA VACCINE  Completed  . 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 GI /GU: No adominal pain, vomiting, change in bowel habits. No blood in the stool. No significant GU symptoms. SKIN/HEME: ,no acute skin rashes suspicious lesions or bleeding. No lymphadenopathy, nodules, masses.  NEURO/ PSYCH:  NoNEW  neurologic signs such as weakness numbness. No depression anxiety. IMM/ Allergy: No unusual infections.  Allergy .   REST of 12 system review negative except as per HPI   Past Medical History:  Diagnosis Date  . Asthma   . Gestational hypertension   . Perinatal arterial ischemic stroke ([place])   . Scoliosis   . Stroke [facility]) birth   rt hemiparesis  Perinatal  . Supervision of other normal pregnancy 05/04/2014    Past Surgical History:  Procedure Laterality Date  . FOOT SURGERY     rt foot  . FOOT SURGERY     Bone placed  . moles removed     from back    Family History  Problem Relation Age of  Onset  . Hypertension Mother   . Asthma Mother   . Hypertension Father   . Asthma Sister   . Cancer Maternal Grandfather     pancreatic  . Cancer Paternal Grandmother     cancer all over  . Other Paternal Grandfather     brain damage  . Other Maternal Grandmother     heart problems    Social History   Social History  . Marital status: Married    Spouse name: N/A  . Number of children: 2  . Years of education: N/A   Occupational History  . Customer Service    Social History Main Topics  . Smoking status: Former Smoker    Types: Cigarettes    Quit date: 07/11/2007  . Smokeless tobacco: Never Used  . Alcohol use 0.6 oz/week    1 Standard drinks or equivalent per week     Comment: Every other day  . Drug use: No  . Sexual activity: Not Currently    Birth control/ protection: IUD   Other Topics Concern  . 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     Currently nonsmoking    HH of 4 no pets     had mild preeclampsia with vaginal childbirth.  2011  cb 12 15 preeclampsi no complicatinos   ocass etoh.    Exercise  Walking     Sleep ok    caffiene limited per day .                    EXAM:  BP 120/80 (BP Location: Right Arm, Patient Position: Sitting, Cuff Size: Large)   Temp 98.2 F (36.8 C) (Oral)   Ht 5' 2.5" (1.588 m)   Wt 222 lb 12.8 oz (101.1 kg)   BMI 40.10 kg/m   Body mass index is 40.1 kg/m.  Physical Exam: Vital signs reviewed [id number] is a well-developed well-nourished alert cooperative    who appearsr stated age in no acute distress. Mild right hemiparasis  Arm more than leg  HEENT: normocephalic atraumatic , Eyes: PERRL EOM's full, conjunctiva clear, Nares: paten,t no deformity discharge or tenderness., Ears: no deformity EAC's clear TMs with normal landmarks. Mouth: clear OP, no lesions, edema.  Moist mucous membranes. Dentition in adequate repair. NECK:  supple without masses, thyromegaly or bruits. CHEST/PULM:  Clear to auscultation and percussion breath sounds equal no wheeze , rales or rhonchi. No chest wall deformities or tenderness.Breast: normal by inspection . No dimpling, discharge, masses, tenderness or discharge . CV: PMI is nondisplaced, S1 S2 no gallops, murmurs, rubs. Peripheral pulses are full without delay.No JVD .  ABDOMEN: Bowel sounds normal nontender  No guard or rebound, no hepato splenomegal no CVA tenderness.  No hernia. Extremtities:  No clubbing cyanosis or edema, no acute joint swelling or redness no focal atrophy NEURO:  Oriented x3, cranial nerves 3-12 appear to be intactrue mild atrophy movement  no clonus   SKIN: No acute rashes normal turgor, color, no bruising or petechiae. Right upper back keloid at place of  Fbx?  Left foot   Small mole  PSYCH: Oriented, good eye contact, no obvious depression anxiety, cognition and judgment appear normal. LN: no cervical axillary inguinal adenopathy  Gyne  Ext gu nl pink mucosa   cx facing down cannot see string but not a open view   Pap done partly  Obstructed   bomanual no masses noted and no tenderness  ASSESSMENT AND PLAN:  Discussed the following assessment and plan:  Visit for preventive health examination - Plan: Basic metabolic panel, CBC with Differential/Platelet, Hepatic function panel, Lipid panel, TSH, PAP [[place]]  Encounter for gynecological examination without abnormal finding - Plan: PAP [[place]]  Need for prophylactic vaccination and inoculation against influenza - Plan: Flu Vaccine QUAD 36+ mos PF IM (Fluarix & Fluzone Quad PF)  Hx of extrinsic asthma  Hemiparesis, right ([place]) Can see her gyne    Pap pending   ? String placement periods pap . Etc .  Get labs  Fasting  In next weeks   Lipids have been up in past Patient Care Team: Burnis Medin, MD as PCP - General Jodi Geralds, MD (Neurology) Jonnie Kind, MD (Obstetrics and  Gynecology) Marybelle Killings, MD (Orthopedic Surgery) wong (Neurology) Patient Instructions  Will notify you when pap results are available. Cervix was popinting down and not  Sure about the string  Of the iud placement   Depending on the   Pap results  We may have you OB gyne check you also last pap was 2014 and was normal  Can get fasting blood work when convenient  To include cholesterol  Blood sugar and  Thyroid. .   Get enough sleep   Heart  healthy diet  Mediterranean  Healthy lifestyle includes : At least 150 minutes of exercise weeks  , weight at healthy levels, which is usually   BMI 19-25. Avoid trans fats and processed foods;  Increase fresh fruits and veges to 5 servings per day. And avoid sweet beverages including tea and juice. Mediterranean diet with olive oil and nuts have been noted to be heart and brain healthy . Avoid tobacco products . Limit  alcohol to  7 per week for women and 14 servings for men.  Get adequate sleep . Wear seat belts . Don't text and drive .        Standley Brooking. Alanea Woolridge M.D.   Medication List       Accurate as of 10/13/16  5:06 PM. Always use your most recent med list.          albuterol 108 (90 Base) MCG/ACT inhaler Commonly known as:  PROVENTIL HFA;VENTOLIN HFA Inhale 2 puffs into the lungs every 4 (four) hours as needed for wheezing or shortness of breath.   PARAGARD INTRAUTERINE COPPER IU by Intrauterine route.

## 2016-10-13 ENCOUNTER — Other Ambulatory Visit (HOSPITAL_COMMUNITY)
Admission: RE | Admit: 2016-10-13 | Discharge: 2016-10-13 | Disposition: A | Discharge status: Home / Self Care | Payer: BLUE CROSS/BLUE SHIELD | Source: Ambulatory Visit | Attending: Internal Medicine | Admitting: Internal Medicine

## 2016-10-13 ENCOUNTER — Encounter: Payer: Self-pay | Admitting: Internal Medicine

## 2016-10-13 ENCOUNTER — Ambulatory Visit (INDEPENDENT_AMBULATORY_CARE_PROVIDER_SITE_OTHER): Payer: BLUE CROSS/BLUE SHIELD | Admitting: Internal Medicine

## 2016-10-13 VITALS — BP 120/80 | Temp 98.2°F | Ht 62.5 in | Wt 222.8 lb

## 2016-10-13 DIAGNOSIS — Z8709 Personal history of other diseases of the respiratory system: Secondary | ICD-10-CM | POA: Diagnosis not present

## 2016-10-13 DIAGNOSIS — Z0000 Encounter for general adult medical examination without abnormal findings: Secondary | ICD-10-CM

## 2016-10-13 DIAGNOSIS — Z01419 Encounter for gynecological examination (general) (routine) without abnormal findings: Secondary | ICD-10-CM | POA: Diagnosis not present

## 2016-10-13 DIAGNOSIS — G8191 Hemiplegia, unspecified affecting right dominant side: Secondary | ICD-10-CM

## 2016-10-13 DIAGNOSIS — Z01411 Encounter for gynecological examination (general) (routine) with abnormal findings: Secondary | ICD-10-CM | POA: Diagnosis not present

## 2016-10-13 DIAGNOSIS — Z23 Encounter for immunization: Secondary | ICD-10-CM | POA: Diagnosis not present

## 2016-10-13 NOTE — Patient Instructions (Addendum)
Will notify you when pap results are available. Cervix was popinting down and not  Sure about the string  Of the iud placement   Depending on the   Pap results  We may have you OB gyne check you also last pap was 2014 and was normal  Can get fasting blood work when convenient  To include cholesterol  Blood sugar and  Thyroid. .   Get enough sleep   Heart healthy diet  Mediterranean  Healthy lifestyle includes : At least 150 minutes of exercise weeks  , weight at healthy levels, which is usually   BMI 19-25. Avoid trans fats and processed foods;  Increase fresh fruits and veges to 5 servings per day. And avoid sweet beverages including tea and juice. Mediterranean diet with olive oil and nuts have been noted to be heart and brain healthy . Avoid tobacco products . Limit  alcohol to  7 per week for women and 14 servings for men.  Get adequate sleep . Wear seat belts . Don't text and drive .

## 2016-10-16 ENCOUNTER — Other Ambulatory Visit: Payer: BLUE CROSS/BLUE SHIELD

## 2016-10-16 LAB — CYTOLOGY - PAP: Diagnosis: NEGATIVE

## 2016-10-23 ENCOUNTER — Other Ambulatory Visit (INDEPENDENT_AMBULATORY_CARE_PROVIDER_SITE_OTHER): Payer: BLUE CROSS/BLUE SHIELD

## 2016-10-23 ENCOUNTER — Encounter: Payer: Self-pay | Admitting: Women's Health

## 2016-10-23 ENCOUNTER — Ambulatory Visit (INDEPENDENT_AMBULATORY_CARE_PROVIDER_SITE_OTHER): Payer: BLUE CROSS/BLUE SHIELD | Admitting: Women's Health

## 2016-10-23 VITALS — BP 124/80 | HR 68 | Wt 225.0 lb

## 2016-10-23 DIAGNOSIS — Z0000 Encounter for general adult medical examination without abnormal findings: Secondary | ICD-10-CM

## 2016-10-23 DIAGNOSIS — B9689 Other specified bacterial agents as the cause of diseases classified elsewhere: Secondary | ICD-10-CM | POA: Diagnosis not present

## 2016-10-23 DIAGNOSIS — Z30431 Encounter for routine checking of intrauterine contraceptive device: Secondary | ICD-10-CM | POA: Diagnosis not present

## 2016-10-23 DIAGNOSIS — N898 Other specified noninflammatory disorders of vagina: Secondary | ICD-10-CM

## 2016-10-23 DIAGNOSIS — N760 Acute vaginitis: Secondary | ICD-10-CM | POA: Diagnosis not present

## 2016-10-23 DIAGNOSIS — Z113 Encounter for screening for infections with a predominantly sexual mode of transmission: Secondary | ICD-10-CM | POA: Diagnosis not present

## 2016-10-23 DIAGNOSIS — R102 Pelvic and perineal pain: Secondary | ICD-10-CM | POA: Diagnosis not present

## 2016-10-23 LAB — POCT WET PREP (WET MOUNT)
Clue Cells Wet Prep Whiff POC: POSITIVE
TRICHOMONAS WET PREP HPF POC: ABSENT

## 2016-10-23 LAB — CBC WITH DIFFERENTIAL/PLATELET
BASOS PCT: 0.4 % (ref 0.0–3.0)
Basophils Absolute: 0 10*3/uL (ref 0.0–0.1)
EOS PCT: 3 % (ref 0.0–5.0)
Eosinophils Absolute: 0.2 10*3/uL (ref 0.0–0.7)
HEMATOCRIT: 42.3 % (ref 36.0–46.0)
HEMOGLOBIN: 14.5 g/dL (ref 12.0–15.0)
LYMPHS PCT: 33.5 % (ref 12.0–46.0)
Lymphs Abs: 2.6 10*3/uL (ref 0.7–4.0)
MCHC: 34.3 g/dL (ref 30.0–36.0)
MCV: 86.9 fl (ref 78.0–100.0)
MONOS PCT: 6.2 % (ref 3.0–12.0)
Monocytes Absolute: 0.5 10*3/uL (ref 0.1–1.0)
Neutro Abs: 4.4 10*3/uL (ref 1.4–7.7)
Neutrophils Relative %: 56.9 % (ref 43.0–77.0)
Platelets: 375 10*3/uL (ref 150.0–400.0)
RBC: 4.86 Mil/uL (ref 3.87–5.11)
RDW: 12.9 % (ref 11.5–15.5)
WBC: 7.8 10*3/uL (ref 4.0–10.5)

## 2016-10-23 LAB — LIPID PANEL
CHOL/HDL RATIO: 4
Cholesterol: 179 mg/dL (ref 0–200)
HDL: 44.3 mg/dL (ref >39.00)
LDL Cholesterol: 115 mg/dL — ABNORMAL HIGH (ref 0–99)
NonHDL: 134.86
TRIGLYCERIDES: 99 mg/dL (ref 0.0–149.0)
VLDL: 19.8 mg/dL (ref 0.0–40.0)

## 2016-10-23 LAB — BASIC METABOLIC PANEL
BUN: 16 mg/dL (ref 6–23)
CHLORIDE: 109 meq/L (ref 96–112)
CO2: 22 mEq/L (ref 19–32)
CREATININE: 0.49 mg/dL (ref 0.40–1.20)
Calcium: 9 mg/dL (ref 8.4–10.5)
GFR: 160.38 mL/min (ref >60.00)
Glucose, Bld: 96 mg/dL (ref 70–99)
POTASSIUM: 3.9 meq/L (ref 3.5–5.1)
Sodium: 140 mEq/L (ref 135–145)

## 2016-10-23 LAB — HEPATIC FUNCTION PANEL
ALBUMIN: 4.4 g/dL (ref 3.5–5.2)
ALT: 17 U/L (ref 0–35)
AST: 13 U/L (ref 0–37)
Alkaline Phosphatase: 60 U/L (ref 39–117)
BILIRUBIN DIRECT: 0.1 mg/dL (ref 0.0–0.3)
TOTAL PROTEIN: 6.8 g/dL (ref 6.0–8.3)
Total Bilirubin: 0.9 mg/dL (ref 0.2–1.2)

## 2016-10-23 LAB — TSH: TSH: 2.34 u[IU]/mL (ref 0.35–4.50)

## 2016-10-23 MED ORDER — METRONIDAZOLE 500 MG PO TABS: 500.0000 mg | ORAL_TABLET | Freq: Two times a day (BID) | ORAL | 0 refills | Status: DC

## 2016-10-23 NOTE — Patient Instructions (Signed)

## 2016-10-23 NOTE — Progress Notes (Signed)
   [facility] Visit  Patient name: Nikolette Byers MRN [id number]  Date of birth: 05/19/1991  CC & HPI:  Estalene Kalu is a 25 y.o. G61P2002 Caucasian female presenting today for had pap w/ PCP 10/31, IUD strings not visible. No period since sept. +cramping. Paragard IUD placed 02/12/15. Has had regular periods until Sept.  No LMP recorded. Patient is not currently having periods (Reason: IUD).  Last pap 10/13/16 w/ PCP, neg  Pertinent History Reviewed:  Medical & Surgical Hx:   Past medical, surgical, family, and social history reviewed in electronic medical record Medications: Reviewed & Updated - see associated section Allergies: Reviewed in electronic medical record  Objective Findings:  Vitals: BP 124/80 (BP Location: Right Arm, Patient Position: Sitting, Cuff Size: Large)   Pulse 68   Wt 225 lb (102.1 kg)   BMI 40.50 kg/m  Body mass index is 40.5 kg/m.  Physical Examination: General appearance - alert, well appearing, and in no distress Pelvic - IUD strings visible, normal length, + thin white malodorous d/c Informal pelvic u/s: IUD in correct fundal placement  Results for orders placed or performed in visit on 10/23/16 (from the past 24 hour(s))  POCT Wet Prep [facility])   Collection Time: 10/23/16  1:24 PM  Result Value Ref Range   Source Wet Prep POC vaginal    WBC, Wet Prep HPF POC many    Bacteria Wet Prep HPF POC None None, Few, Too numerous to count   BACTERIA WET PREP MORPHOLOGY POC     Clue Cells Wet Prep HPF POC Many (A) None, Too numerous to count   Clue Cells Wet Prep Whiff POC Positive Whiff    Yeast Wet Prep HPF POC None    KOH Wet Prep POC     Trichomonas Wet Prep HPF POC Absent Absent     Assessment & Plan:  A:   Paragard IUD in place  BV  P:  Rx metronidazole 500mg  BID x 7d for BV, no sex or etoh while taking   GC/CT from urine  Return for prn.  Tawnya Crook CNM, [facility] 10/23/2016 1:25 PM

## 2016-10-26 LAB — GC/CHLAMYDIA PROBE AMP
Chlamydia trachomatis, NAA: NEGATIVE
NEISSERIA GONORRHOEAE BY PCR: NEGATIVE

## 2017-06-21 DIAGNOSIS — Z872 Personal history of diseases of the skin and subcutaneous tissue: Secondary | ICD-10-CM | POA: Diagnosis not present

## 2017-06-21 DIAGNOSIS — D225 Melanocytic nevi of trunk: Secondary | ICD-10-CM | POA: Diagnosis not present

## 2017-06-21 DIAGNOSIS — L910 Hypertrophic scar: Secondary | ICD-10-CM | POA: Diagnosis not present

## 2017-06-21 DIAGNOSIS — L218 Other seborrheic dermatitis: Secondary | ICD-10-CM | POA: Diagnosis not present

## 2017-09-03 ENCOUNTER — Encounter: Payer: Self-pay | Admitting: Internal Medicine

## 2017-09-13 ENCOUNTER — Encounter: Payer: Self-pay | Admitting: Internal Medicine

## 2017-09-15 NOTE — Telephone Encounter (Signed)
Can make ov to check  If concerned    (but if small   And not growing safe to  wait for exam next )

## 2017-10-04 ENCOUNTER — Other Ambulatory Visit: Payer: BLUE CROSS/BLUE SHIELD

## 2017-10-07 ENCOUNTER — Emergency Department (HOSPITAL_COMMUNITY): Payer: BLUE CROSS/BLUE SHIELD

## 2017-10-07 ENCOUNTER — Emergency Department (HOSPITAL_COMMUNITY)
Admission: EM | Admit: 2017-10-07 | Discharge: 2017-10-07 | Disposition: A | Discharge status: Home / Self Care | Payer: BLUE CROSS/BLUE SHIELD | Attending: Emergency Medicine | Admitting: Emergency Medicine

## 2017-10-07 ENCOUNTER — Encounter (HOSPITAL_COMMUNITY): Payer: Self-pay

## 2017-10-07 DIAGNOSIS — Z87891 Personal history of nicotine dependence: Secondary | ICD-10-CM | POA: Insufficient documentation

## 2017-10-07 DIAGNOSIS — R0600 Dyspnea, unspecified: Secondary | ICD-10-CM | POA: Insufficient documentation

## 2017-10-07 DIAGNOSIS — J45909 Unspecified asthma, uncomplicated: Secondary | ICD-10-CM | POA: Diagnosis not present

## 2017-10-07 DIAGNOSIS — I69351 Hemiplegia and hemiparesis following cerebral infarction affecting right dominant side: Secondary | ICD-10-CM | POA: Diagnosis not present

## 2017-10-07 DIAGNOSIS — R0602 Shortness of breath: Secondary | ICD-10-CM

## 2017-10-07 DIAGNOSIS — R079 Chest pain, unspecified: Secondary | ICD-10-CM | POA: Diagnosis not present

## 2017-10-07 DIAGNOSIS — R0789 Other chest pain: Secondary | ICD-10-CM

## 2017-10-07 LAB — CBC
HCT: 42.5 % (ref 36.0–46.0)
Hemoglobin: 14.4 g/dL (ref 12.0–15.0)
MCH: 29.3 pg (ref 26.0–34.0)
MCHC: 33.9 g/dL (ref 30.0–36.0)
MCV: 86.4 fL (ref 78.0–100.0)
PLATELETS: 439 10*3/uL — AB (ref 150–400)
RBC: 4.92 MIL/uL (ref 3.87–5.11)
RDW: 12.8 % (ref 11.5–15.5)
WBC: 10.3 10*3/uL (ref 4.0–10.5)

## 2017-10-07 LAB — BASIC METABOLIC PANEL
Anion gap: 9 (ref 5–15)
BUN: 14 mg/dL (ref 6–20)
CHLORIDE: 109 mmol/L (ref 101–111)
CO2: 22 mmol/L (ref 22–32)
CREATININE: 0.73 mg/dL (ref 0.44–1.00)
Calcium: 9.3 mg/dL (ref 8.9–10.3)
GFR calc non Af Amer: >60 mL/min (ref >60)
Glucose, Bld: 116 mg/dL — ABNORMAL HIGH (ref 65–99)
Potassium: 3.6 mmol/L (ref 3.5–5.1)
Sodium: 140 mmol/L (ref 135–145)

## 2017-10-07 LAB — I-STAT TROPONIN, ED: Troponin i, poc: 0 ng/mL (ref 0.00–0.08)

## 2017-10-07 MED ORDER — ALBUTEROL SULFATE (2.5 MG/3ML) 0.083% IN NEBU
5.0000 mg | INHALATION_SOLUTION | Freq: Once | RESPIRATORY_TRACT | Status: AC
  Administered 2017-10-07: 5 mg via RESPIRATORY_TRACT
  Filled 2017-10-07: qty 6

## 2017-10-07 MED ORDER — PREDNISONE 20 MG PO TABS
60.0000 mg | ORAL_TABLET | Freq: Once | ORAL | Status: AC
  Administered 2017-10-07: 60 mg via ORAL
  Filled 2017-10-07: qty 3

## 2017-10-07 MED ORDER — ALBUTEROL SULFATE HFA 108 (90 BASE) MCG/ACT IN AERS
2.0000 | INHALATION_SPRAY | Freq: Once | RESPIRATORY_TRACT | Status: AC
  Administered 2017-10-07: 2 via RESPIRATORY_TRACT
  Filled 2017-10-07: qty 6.7

## 2017-10-07 MED ORDER — PREDNISONE 20 MG PO TABS: 40.0000 mg | ORAL_TABLET | Freq: Every day | ORAL | 0 refills | Status: DC

## 2017-10-07 NOTE — ED Notes (Signed)
Patient Alert and oriented X4. Stable and ambulatory. Patient verbalized understanding of the discharge instructions.  Patient belongings were taken by the patient.  

## 2017-10-07 NOTE — ED Provider Notes (Signed)
[facility] EMERGENCY DEPARTMENT Provider Note   CSN: [id number] Arrival date & time: 10/07/17  1351     History   Chief Complaint Chief Complaint  Patient presents with  . Shortness of Breath    HPI Tina Mckee is a 26 y.o. female.  HPI  Resents with concern of dyspnea, chest pressure. Onset was about 3 days ago, about the same time she ran out of her home inhaler. Patient has multiple medical issues including perinatal stroke, persistent right-sided hemiparesis, but denies any substantial changes in her weakness, no new syncope, lightheadedness, headache. Patient has history of asthma, has not been hospitalized in a long time, has not been on steroids in a long time. Since running out of her medications several days ago she has had worsening intermittent chest pressure, anterior, sternal, nonradiating. There is associated persistent dyspnea.   Past Medical History:  Diagnosis Date  . Asthma   . Gestational hypertension   . Perinatal arterial ischemic stroke ([place])   . Scoliosis   . Stroke [facility]) birth   rt hemiparesis  Perinatal  . Supervision of other normal pregnancy 05/04/2014    Patient Active Problem List   Diagnosis Date Noted  . Hemiparesis affecting right side as late effect of cerebrovascular accident ([place]) 02/06/2016  . Pigmented skin lesion of uncertain nature 00/37/0488  . Hyperlipidemia 02/20/2015  . Visit for preventive health examination 02/20/2015  . Hx of fall 02/20/2015  . Encounter for IUD insertion 02/12/2015  . Cystic 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, antepartum 11/14/2014  . Rubella non-immune status, antepartum 07/31/2014  . History of stroke 05/04/2014  . Obesity (BMI 30-39.9) 09/29/2013  . Thrombocytosis ([place]) 10/01/2012  . High blood triglycerides 09/27/2012  . Abnormal LFTs 09/27/2012  . Weight gain 09/27/2012  . Shaking 04/22/2012  . Swelling 04/22/2012  . Hyperglycemia 09/23/2011  . Abnormal glucose 09/21/2011  . Falling  episodes 09/21/2011  . Cervical radicular pain 08/03/2011  . Wrist pain 02/16/2011  . Hemiparesis, right ([place]) 02/16/2011  . SCOLIOSIS 01/16/2009    Past Surgical History:  Procedure Laterality Date  . FOOT SURGERY     rt foot  . FOOT SURGERY     Bone placed  . moles removed     from back    OB History    Gravida Para Term Preterm AB Living   2 2 2  0   2   SAB TAB Ectopic Multiple Live Births         0 2       Home Medications    Prior to Admission medications   Medication Sig Start Date End Date Taking? Authorizing Provider  albuterol (PROVENTIL HFA;VENTOLIN HFA) 108 (90 BASE) MCG/ACT inhaler Inhale 2 puffs into the lungs every 4 (four) hours as needed for wheezing or shortness of breath. 10/20/13   Burchette, Alinda Sierras, MD  metroNIDAZOLE (FLAGYL) 500 MG tablet Take 1 tablet (500 mg total) by mouth 2 (two) times daily. X 7 days. No sex or alcohol while taking 10/23/16   Roma Schanz, CNM  PARAGARD INTRAUTERINE COPPER IU by Intrauterine route.    [provider]    Family History Family History  Problem Relation Age of Onset  . Hypertension Mother   . Asthma Mother   . Hypertension Father   . Asthma Sister   . Cancer Maternal Grandfather        pancreatic  . Cancer Paternal Grandmother        cancer all over  . Other  Paternal Grandfather        brain damage  . Other Maternal Grandmother        heart problems    Social History Social History  Substance Use Topics  . Smoking status: Former Smoker    Types: Cigarettes    Quit date: 07/11/2007  . Smokeless tobacco: Never Used  . Alcohol use 0.6 oz/week    1 Standard drinks or equivalent per week     Comment: Every other day     Allergies   Cephalexin   Review of Systems Review of Systems  Constitutional:       Per HPI, otherwise negative  HENT:       Per HPI, otherwise negative  Respiratory:       Per HPI, otherwise negative  Cardiovascular:       Per HPI, otherwise negative    Gastrointestinal: Negative for vomiting.  Endocrine:       Negative aside from HPI  Genitourinary:       Neg aside from HPI   Musculoskeletal:       Per HPI, otherwise negative  Skin: Negative.   Neurological: Positive for speech difficulty and weakness. Negative for syncope.     Physical Exam Updated Vital Signs BP 133/89   Pulse 98   Temp 98.1 F (36.7 C) (Oral)   Resp (!) 26   SpO2 99%   Physical Exam  Constitutional: She is oriented to person, place, and time. She appears well-developed and well-nourished. No distress.  HENT:  Head: Normocephalic and atraumatic.  Eyes: Conjunctivae and EOM are normal.  Cardiovascular: Normal rate and regular rhythm.   Pulmonary/Chest: No stridor. No respiratory distress. She has decreased breath sounds.  Abdominal: She exhibits no distension.  Musculoskeletal: She exhibits no edema.  Neurological: She is alert and oriented to person, place, and time. No cranial nerve deficit.  Right-sided atrophy and hemiparesis  Skin: Skin is warm and dry.  Psychiatric: She has a normal mood and affect.  Nursing note and vitals reviewed.    ED Treatments / Results  Labs (all labs ordered are listed, but only abnormal results are displayed) Labs Reviewed  BASIC METABOLIC PANEL - Abnormal; Notable for the following:       Result Value   Glucose, Bld 116 (*)    All other components within normal limits  CBC - Abnormal; Notable for the following:    Platelets 439 (*)    All other components within normal limits  I-STAT TROPONIN, ED    EKG  EKG Interpretation  Date/Time:  Thursday October 07 2017 13:59:40 EDT Ventricular Rate:  89 PR Interval:  158 QRS Duration: 86 QT Interval:  380 QTC Calculation: 462 R Axis:   59 Text Interpretation:  Normal sinus rhythm T wave abnormality Artifact Abnormal ekg Confirmed by Carmin Muskrat [phone]) on 10/07/2017 5:18:33 PM       Radiology Dg Chest 2 View  Result Date: 10/07/2017 CLINICAL  DATA:  Chest pain and shortness of breath EXAM: CHEST  2 VIEW COMPARISON:  May 13, 2016 FINDINGS: Lungs are clear. Heart size and pulmonary vascularity are normal. No adenopathy. No pneumothorax. No bone lesions. IMPRESSION: No edema or consolidation. Electronically Signed   By: Lowella Grip III M.D.   On: 10/07/2017 14:27    Procedures Procedures (including critical care time)  Medications Ordered in ED Medications  albuterol (PROVENTIL) (2.5 MG/3ML) 0.083% nebulizer solution 5 mg (5 mg Nebulization Given 10/07/17 1753)  albuterol (PROVENTIL HFA;VENTOLIN HFA) 108 (  90 Base) MCG/ACT inhaler 2 puff (2 puffs Inhalation Given 10/07/17 1753)  predniSONE (DELTASONE) tablet 60 mg (60 mg Oral Given 10/07/17 1753)     Initial Impression / Assessment and Plan / ED Course  I have reviewed the triage vital signs and the nursing notes.  Pertinent labs & imaging results that were available during my care of the patient were reviewed by me and considered in my medical decision making (see chart for details).  Female presents with 3 days of chest pressure and dyspnea. Patient has multiple medical issues, but most pertinently, has asthma, and no longer has her inhaler. Here, patient is awake and alert, with no fever, no leukocytosis, no x-ray evidence of pneumonia. Troponin unremarkable, EKG unremarkable. Patient received breathing treatment here, was started on steroids, received an albuterol inhaler and was discharged with primary care follow-up.  Final Clinical Impressions(s) / ED Diagnoses  Atypical chest pain Dyspnea   Carmin Muskrat, MD 10/07/17 1757

## 2017-10-07 NOTE — Discharge Instructions (Signed)
As discussed, your evaluation today has been largely reassuring.  But, it is important that you monitor your condition carefully, and do not hesitate to return to the ED if you develop new, or concerning changes in your condition. ? ?Otherwise, please follow-up with your physician for appropriate ongoing care. ? ?

## 2017-10-07 NOTE — ED Triage Notes (Signed)
Patient reports that she developed sharp chest pain and shortness of breath while at work today, arrived hyperventilating. Patient alert and oriented, denis cold symptoms but reports wheezing at night, NAD

## 2017-10-14 NOTE — Progress Notes (Signed)
Chief Complaint  Patient presents with  . Annual Exam    Pt notes heart racing and SOB at rest. Pt notes same sx's with exertion at times.     HPI: Patient  Tina Mckee  26 y.o. comes in today for Preventive Health Care visit here with husband and young child.  She was seen recently in the emergency room for chest pain atypical in shortness of breath had negative cardiac enzymes and was told that it was probably a? Panic attack .    In ed. however was also given an albuterol inhaler which does seem to help sometimes. She has gained weight in the last year which she thinks could be contributing however she does get short of breath on exertion and sometimes sitting sometimes waking in the middle the night feels short of breath but wonders if that is anxiety.  She has stress at work long days.  No change in her neurologic status with a right hemiparesis.  No recent falling. She has an IUD nonhormonal.  3 days still gets cramping sometimes gets breast tenderness shooting pains in breast.  Health Maintenance  Topic Date Due  . INFLUENZA VACCINE  07/14/2017  . PAP SMEAR  10/14/2019  . TETANUS/TDAP  08/02/2021  . HIV Screening  Completed   Health Maintenance Review LIFESTYLE:  Exercise:  Active   .   Tobacco/ETS:  no Alcohol:  Once a night  Sugar beverages: mores sodas lately     Sleep: 5-6  Drug use: no HH of  4  Work:  40 hours   10 - 7  iud praguard last 3 days .    Cramps.   ROS:  GEN/ HEENT: No fever, significant weight changes sweats headaches vision problems hearing changes, CV/ PULM; No cough, syncope,edema see above HPI GI /GU: No adominal pain, vomiting, change in bowel habits. No blood in the stool. No significant GU symptoms. SKIN/HEME: ,no acute skin rashes suspicious lesions or bleeding. No lymphadenopathy, nodules, masses.  NEURO/ PSYCH:  No progressive see HPI baseline neurologic signs such as weakness numbness.  anxiety. IMM/ Allergy: No unusual infections.   Allergy .   REST of 12 system review negative except as per HPI   Past Medical History:  Diagnosis Date  . Asthma   . Gestational hypertension   . Perinatal arterial ischemic stroke ([place])   . Scoliosis   . Stroke [facility]) birth   rt hemiparesis  Perinatal  . Supervision of other normal pregnancy 05/04/2014    Past Surgical History:  Procedure Laterality Date  . FOOT SURGERY     rt foot  . FOOT SURGERY     Bone placed  . moles removed     from back    Family History  Problem Relation Age of Onset  . Hypertension Mother   . Asthma Mother   . Hypertension Father   . Asthma Sister   . Cancer Maternal Grandfather        pancreatic  . Cancer Paternal Grandmother        cancer all over  . Other Paternal Grandfather        brain damage  . Other Maternal Grandmother        heart problems    Social History   Social History  . Marital status: Married    Spouse name: N/A  . Number of children: 2  . Years of education: N/A   Occupational History  . Customer Service    Social History  Main Topics  . Smoking status: Former Smoker    Types: Cigarettes    Quit date: 07/11/2007  . Smokeless tobacco: Never Used  . Alcohol use 0.6 oz/week    1 Standard drinks or equivalent per week     Comment: Every other day  . Drug use: No  . Sexual activity: Yes    Birth control/ protection: IUD   Other Topics Concern  . 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     Currently nonsmoking    HH of 4 no pets     had mild preeclampsia with vaginal childbirth.  2011   cb 12 15 preeclampsi no complicatinos   ocass etoh.    Exercise  Walking     Sleep ok    caffiene limited per day .                 Outpatient Medications Prior to Visit  Medication Sig Dispense Refill  . albuterol (PROVENTIL HFA;VENTOLIN HFA) 108 (90 BASE) MCG/ACT inhaler Inhale 2 puffs into the lungs every 4 (four) hours as needed  for wheezing or shortness of breath. 1 Inhaler 2  . aspirin-acetaminophen-caffeine (EXCEDRIN MIGRAINE) 250-250-65 MG tablet Take 2 tablets by mouth every 6 (six) hours as needed for headache.    . ibuprofen (ADVIL,MOTRIN) 200 MG tablet Take 400 mg by mouth every 6 (six) hours as needed for headache.    Marland Kitchen PARAGARD INTRAUTERINE COPPER IU by Intrauterine route.    . metroNIDAZOLE (FLAGYL) 500 MG tablet Take 1 tablet (500 mg total) by mouth 2 (two) times daily. X 7 days. No sex or alcohol while taking (Patient not taking: Reported on 10/07/2017) 14 tablet 0  . predniSONE (DELTASONE) 20 MG tablet Take 2 tablets (40 mg total) by mouth daily with breakfast. For the next four days (Patient not taking: Reported on 10/15/2017) 8 tablet 0   No facility-administered medications prior to visit.      EXAM:  BP (!) 132/96 (BP Location: Left Arm, Patient Position: Sitting, Cuff Size: Normal)   Pulse 73   Temp 98 F (36.7 C) (Oral)   Ht 5' 2.25" (1.581 m)   Wt 239 lb 6.4 oz (108.6 kg)   BMI 43.44 kg/m   Body mass index is 43.44 kg/m. Wt Readings from Last 3 Encounters:  10/15/17 239 lb 6.4 oz (108.6 kg)  10/23/16 225 lb (102.1 kg)  10/13/16 222 lb 12.8 oz (101.1 kg)    Physical Exam: Vital signs reviewed QBH:ALPF is a well-developed well-nourished alert cooperative    who appearsr stated age in no acute distress.  Right hemi-para-cysts stable ambulatory. HEENT: normocephalic atraumatic , Eyes: PERRL EOM's full, conjunctiva clear, Nares: paten,t no deformity discharge or tenderness., Ears: no deformity EAC's clear TMs with normal landmarks. Mouth: clear OP, no lesions, edema.  Moist mucous membranes. Dentition in adequate repair. NECK: supple without masses, thyromegaly or bruits. CHEST/PULM:  Clear to auscultation and percussion breath sounds equal no wheeze , rales or rhonchi. No chest wall deformities or tenderness. Breast: normal by inspection . No dimpling, discharge, masses, tenderness or  discharge . CV: PMI is nondisplaced, S1 S2 no gallops, murmurs, rubs. Peripheral pulses are full without delay.No JVD .  ABDOMEN: Bowel sounds normal nontender  No guard or rebound, no hepato splenomegal no CVA tenderness.   Extremtities:  No clubbing cyanosis or edema, no  acute joint swelling or redness no focal atrophy NEURO:  Oriented x3, cranial nerves 3-12 appear to be intact,   right hemi-parous is right upper extremity more than right lower extremity.  SKIN: No acute rashes normal turgor, color, no bruising or petechiae. PSYCH: Oriented, good eye contact, no obvious depression anxiety, cognition and judgment appear normal. LN: no cervical axillary inguinal adenopathy  Lab Results  Component Value Date   WBC 10.3 10/07/2017   HGB 14.4 10/07/2017   HCT 42.5 10/07/2017   PLT 439 (H) 10/07/2017   GLUCOSE 116 (H) 10/07/2017   CHOL 186 10/15/2017   TRIG 204.0 (H) 10/15/2017   HDL 43.50 10/15/2017   LDLDIRECT 180.0 10/15/2017   LDLCALC 115 (H) 10/23/2016   ALT 17 10/23/2016   AST 13 10/23/2016   NA 140 10/07/2017   K 3.6 10/07/2017   CL 109 10/07/2017   CREATININE 0.73 10/07/2017   BUN 14 10/07/2017   CO2 22 10/07/2017   TSH 1.59 10/15/2017   HGBA1C 5.4 10/15/2017   MICROALBUR 0.9 09/20/2012    BP Readings from Last 3 Encounters:  10/15/17 (!) 132/96  10/07/17 120/80  10/23/16 124/80   Wt Readings from Last 3 Encounters:  10/15/17 239 lb 6.4 oz (108.6 kg)  10/23/16 225 lb (102.1 kg)  10/13/16 222 lb 12.8 oz (101.1 kg)    Lab results reviewed with patient from the emergency room.  ASSESSMENT AND PLAN:  Discussed the following assessment and plan:  Visit for preventive health examination - Plan: Lipid panel, Hemoglobin A1c, TSH, Glucose, Fasting  Hx of extrinsic asthma - Plan: Lipid panel, Hemoglobin A1c, TSH, Glucose, Fasting  Hyperglycemia - Plan: Lipid panel, Hemoglobin A1c, TSH, Glucose, Fasting  Hyperlipidemia, unspecified hyperlipidemia type - Plan: Lipid  panel, Hemoglobin A1c, TSH, Glucose, Fasting  Dyspnea, unspecified type - Plan: Lipid panel, Hemoglobin A1c, TSH, Glucose, Fasting, Pulmonary function test  Need for influenza vaccination - Plan: Flu Vaccine QUAD 6+ mos PF IM (Fluarix Quad PF)  Hemiparesis, right (HCC) Difficult to tell if the shortness of breath is asthmatic other organic cause or related to her anxiety.  To me it is not clear-cut that it is all anxiety. She does have some nocturnal snoring and awakening family history of sleep apnea. At this time we will proceed with pulmonary function tests consider adding daily anxiety medication discussed and follow-up other strategies and measures.  Optimize lifestyle such as stopping her sodas which she is well aware.  Lab work today including blood sugar monitoring.  Plan follow-up depending on all the above. Patient Care Team: Neilani Duffee, Standley Brooking, MD as PCP - General Gaynell Face Princess Bruins, MD (Neurology) Jonnie Kind, MD (Obstetrics and Gynecology) Marybelle Killings, MD (Orthopedic Surgery) wong (Neurology) Patient Instructions  Dyspnea could be asthma and or stress .  Consideration also    Lung functions and   Consideration of  counseling anxiety he;lp     Medication   Sometimes helps    Over time.  Meds  such as celexa, Sertraline,     Daily .    And then follow .  Plan     NO SODAS       Preventive Care 18-39 Years, Female Preventive care refers to lifestyle choices and visits with your health care provider that can promote health and wellness. What does preventive care include?  A yearly physical exam. This is also called an annual well check.  Dental exams once or twice a year.  Routine eye exams. Ask  your health care provider how often you should have your eyes checked.  Personal lifestyle choices, including: ? Daily care of your teeth and gums. ? Regular physical activity. ? Eating a healthy diet. ? Avoiding tobacco and drug use. ? Limiting alcohol  use. ? Practicing safe sex. ? Taking vitamin and mineral supplements as recommended by your health care provider. What happens during an annual well check? The services and screenings done by your health care provider during your annual well check will depend on your age, overall health, lifestyle risk factors, and family history of disease. Counseling Your health care provider may ask you questions about your:  Alcohol use.  Tobacco use.  Drug use.  Emotional well-being.  Home and relationship well-being.  Sexual activity.  Eating habits.  Work and work Statistician.  Method of birth control.  Menstrual cycle.  Pregnancy history.  Screening You may have the following tests or measurements:  Height, weight, and BMI.  Diabetes screening. This is done by checking your blood sugar (glucose) after you have not eaten for a while (fasting).  Blood pressure.  Lipid and cholesterol levels. These may be checked every 5 years starting at age 44.  Skin check.  Hepatitis C blood test.  Hepatitis B blood test.  Sexually transmitted disease (STD) testing.  BRCA-related cancer screening. This may be done if you have a family history of breast, ovarian, tubal, or peritoneal cancers.  Pelvic exam and Pap test. This may be done every 3 years starting at age 87. Starting at age 75, this may be done every 5 years if you have a Pap test in combination with an HPV test.  Discuss your test results, treatment options, and if necessary, the need for more tests with your health care provider. Vaccines Your health care provider may recommend certain vaccines, such as:  Influenza vaccine. This is recommended every year.  Tetanus, diphtheria, and acellular pertussis (Tdap, Td) vaccine. You may need a Td booster every 10 years.  Varicella vaccine. You may need this if you have not been vaccinated.  HPV vaccine. If you are 31 or younger, you may need three doses over 6  months.  Measles, mumps, and rubella (MMR) vaccine. You may need at least one dose of MMR. You may also need a second dose.  Pneumococcal 13-valent conjugate (PCV13) vaccine. You may need this if you have certain conditions and were not previously vaccinated.  Pneumococcal polysaccharide (PPSV23) vaccine. You may need one or two doses if you smoke cigarettes or if you have certain conditions.  Meningococcal vaccine. One dose is recommended if you are age 47-21 years and a first-year college student living in a residence hall, or if you have one of several medical conditions. You may also need additional booster doses.  Hepatitis A vaccine. You may need this if you have certain conditions or if you travel or work in places where you may be exposed to hepatitis A.  Hepatitis B vaccine. You may need this if you have certain conditions or if you travel or work in places where you may be exposed to hepatitis B.  Haemophilus influenzae type b (Hib) vaccine. You may need this if you have certain risk factors.  Talk to your health care provider about which screenings and vaccines you need and how often you need them. This information is not intended to replace advice given to you by your health care provider. Make sure you discuss any questions you have with your health care provider.  Document Released: 01/26/2002 Document Revised: 08/19/2016 Document Reviewed: 10/01/2015 Elsevier Interactive Patient Education  2017 [place].      Generalized Anxiety Disorder, Adult Generalized anxiety disorder (GAD) is a mental health disorder. People with this condition constantly worry about everyday events. Unlike normal anxiety, worry related to GAD is not triggered by a specific event. These worries also do not fade or get better with time. GAD interferes with life functions, including relationships, work, and school. GAD can vary from mild to severe. People with severe GAD can have intense waves of  anxiety with physical symptoms (panic attacks). What are the causes? The exact cause of GAD is not known. What increases the risk? This condition is more likely to develop in:  Women.  People who have a family history of anxiety disorders.  People who are very shy.  People who experience very stressful life events, such as the death of a loved one.  People who have a very stressful family environment.  What are the signs or symptoms? People with GAD often worry excessively about many things in their lives, such as their health and family. They may also be overly concerned about:  Doing well at work.  Being on time.  Natural disasters.  Friendships.  Physical symptoms of GAD include:  Fatigue.  Muscle tension or having muscle twitches.  Trembling or feeling shaky.  Being easily startled.  Feeling like your heart is pounding or racing.  Feeling out of breath or like you cannot take a deep breath.  Having trouble falling asleep or staying asleep.  Sweating.  Nausea, diarrhea, or irritable bowel syndrome (IBS).  Headaches.  Trouble concentrating or remembering facts.  Restlessness.  Irritability.  How is this diagnosed? Your health care provider can diagnose GAD based on your symptoms and medical history. You will also have a physical exam. The health care provider will ask specific questions about your symptoms, including how severe they are, when they started, and if they come and go. Your health care provider may ask you about your use of alcohol or drugs, including prescription medicines. Your health care provider may refer you to a mental health specialist for further evaluation. Your health care provider will do a thorough examination and may perform additional tests to rule out other possible causes of your symptoms. To be diagnosed with GAD, a person must have anxiety that:  Is out of his or her control.  Affects several different aspects of his or  her life, such as work and relationships.  Causes distress that makes him or her unable to take part in normal activities.  Includes at least three physical symptoms of GAD, such as restlessness, fatigue, trouble concentrating, irritability, muscle tension, or sleep problems.  Before your health care provider can confirm a diagnosis of GAD, these symptoms must be present more days than they are not, and they must last for six months or longer. How is this treated? The following therapies are usually used to treat GAD:  Medicine. Antidepressant medicine is usually prescribed for long-term daily control. Antianxiety medicines may be added in severe cases, especially when panic attacks occur.  Talk therapy (psychotherapy). Certain types of talk therapy can be helpful in treating GAD by providing support, education, and guidance. Options include: ? Cognitive behavioral therapy (CBT). People learn coping skills and techniques to ease their anxiety. They learn to identify unrealistic or negative thoughts and behaviors and to replace them with positive ones. ? Acceptance and commitment therapy (ACT). This treatment  teaches people how to be mindful as a way to cope with unwanted thoughts and feelings. ? Biofeedback. This process trains you to manage your body's response (physiological response) through breathing techniques and relaxation methods. You will work with a therapist while machines are used to monitor your physical symptoms.  Stress management techniques. These include yoga, meditation, and exercise.  A mental health specialist can help determine which treatment is best for you. Some people see improvement with one type of therapy. However, other people require a combination of therapies. Follow these instructions at home:  Take over-the-counter and prescription medicines only as told by your health care provider.  Try to maintain a normal routine.  Try to anticipate stressful situations  and allow extra time to manage them.  Practice any stress management or self-calming techniques as taught by your health care provider.  Do not punish yourself for setbacks or for not making progress.  Try to recognize your accomplishments, even if they are small.  Keep all follow-up visits as told by your health care provider. This is important. Contact a health care provider if:  Your symptoms do not get better.  Your symptoms get worse.  You have signs of depression, such as: ? A persistently sad, cranky, or irritable mood. ? Loss of enjoyment in activities that used to bring you joy. ? Change in weight or eating. ? Changes in sleeping habits. ? Avoiding friends or family members. ? Loss of energy for normal tasks. ? Feelings of guilt or worthlessness. Get help right away if:  You have serious thoughts about hurting yourself or others. If you ever feel like you may hurt yourself or others, or have thoughts about taking your own life, get help right away. You can go to your nearest emergency department or call:  Your local emergency services (911 in the [place]  A suicide crisis helpline, such as the [facility] at 219-051-6416. This is open 24 hours a day.  Summary  Generalized anxiety disorder (GAD) is a mental health disorder that involves worry that is not triggered by a specific event.  People with GAD often worry excessively about many things in their lives, such as their health and family.  GAD may cause physical symptoms such as restlessness, trouble concentrating, sleep problems, frequent sweating, nausea, diarrhea, headaches, and trembling or muscle twitching.  A mental health specialist can help determine which treatment is best for you. Some people see improvement with one type of therapy. However, other people require a combination of therapies. This information is not intended to replace advice given to you by your health care  provider. Make sure you discuss any questions you have with your health care provider. Document Released: 03/27/2013 Document Revised: 10/20/2016 Document Reviewed: 10/20/2016 Elsevier Interactive Patient Education  2018 [place]. Raynald Rouillard M.D.

## 2017-10-15 ENCOUNTER — Encounter: Payer: Self-pay | Admitting: Internal Medicine

## 2017-10-15 ENCOUNTER — Ambulatory Visit (INDEPENDENT_AMBULATORY_CARE_PROVIDER_SITE_OTHER): Payer: BLUE CROSS/BLUE SHIELD | Admitting: Internal Medicine

## 2017-10-15 VITALS — BP 132/96 | HR 73 | Temp 98.0°F | Ht 62.25 in | Wt 239.4 lb

## 2017-10-15 DIAGNOSIS — R739 Hyperglycemia, unspecified: Secondary | ICD-10-CM | POA: Diagnosis not present

## 2017-10-15 DIAGNOSIS — Z8709 Personal history of other diseases of the respiratory system: Secondary | ICD-10-CM | POA: Diagnosis not present

## 2017-10-15 DIAGNOSIS — R0600 Dyspnea, unspecified: Secondary | ICD-10-CM | POA: Diagnosis not present

## 2017-10-15 DIAGNOSIS — G8191 Hemiplegia, unspecified affecting right dominant side: Secondary | ICD-10-CM

## 2017-10-15 DIAGNOSIS — E785 Hyperlipidemia, unspecified: Secondary | ICD-10-CM | POA: Diagnosis not present

## 2017-10-15 DIAGNOSIS — Z23 Encounter for immunization: Secondary | ICD-10-CM

## 2017-10-15 DIAGNOSIS — Z0000 Encounter for general adult medical examination without abnormal findings: Secondary | ICD-10-CM

## 2017-10-15 LAB — HEMOGLOBIN A1C: HEMOGLOBIN A1C: 5.4 % (ref 4.6–6.5)

## 2017-10-15 LAB — LIPID PANEL
Cholesterol: 186 mg/dL (ref 0–200)
HDL: 43.5 mg/dL (ref >39.00)
NONHDL: 142.79
Total CHOL/HDL Ratio: 4
Triglycerides: 204 mg/dL — ABNORMAL HIGH (ref 0.0–149.0)
VLDL: 40.8 mg/dL — ABNORMAL HIGH (ref 0.0–40.0)

## 2017-10-15 LAB — TSH: TSH: 1.59 u[IU]/mL (ref 0.35–4.50)

## 2017-10-15 LAB — LDL CHOLESTEROL, DIRECT: LDL DIRECT: 180 mg/dL

## 2017-10-15 NOTE — Patient Instructions (Addendum)
Dyspnea could be asthma and or stress .  Consideration also    Lung functions and   Consideration of  counseling anxiety he;lp     Medication   Sometimes helps    Over time.  Meds  such as celexa, Sertraline,     Daily .    And then follow .  Plan     NO SODAS       Preventive Care 18-39 Years, Female Preventive care refers to lifestyle choices and visits with your health care provider that can promote health and wellness. What does preventive care include?  A yearly physical exam. This is also called an annual well check.  Dental exams once or twice a year.  Routine eye exams. Ask your health care provider how often you should have your eyes checked.  Personal lifestyle choices, including: ? Daily care of your teeth and gums. ? Regular physical activity. ? Eating a healthy diet. ? Avoiding tobacco and drug use. ? Limiting alcohol use. ? Practicing safe sex. ? Taking vitamin and mineral supplements as recommended by your health care provider. What happens during an annual well check? The services and screenings done by your health care provider during your annual well check will depend on your age, overall health, lifestyle risk factors, and family history of disease. Counseling Your health care provider may ask you questions about your:  Alcohol use.  Tobacco use.  Drug use.  Emotional well-being.  Home and relationship well-being.  Sexual activity.  Eating habits.  Work and work Statistician.  Method of birth control.  Menstrual cycle.  Pregnancy history.  Screening You may have the following tests or measurements:  Height, weight, and BMI.  Diabetes screening. This is done by checking your blood sugar (glucose) after you have not eaten for a while (fasting).  Blood pressure.  Lipid and cholesterol levels. These may be checked every 5 years starting at age 11.  Skin check.  Hepatitis C blood test.  Hepatitis B blood test.  Sexually transmitted  disease (STD) testing.  BRCA-related cancer screening. This may be done if you have a family history of breast, ovarian, tubal, or peritoneal cancers.  Pelvic exam and Pap test. This may be done every 3 years starting at age 28. Starting at age 28, this may be done every 5 years if you have a Pap test in combination with an HPV test.  Discuss your test results, treatment options, and if necessary, the need for more tests with your health care provider. Vaccines Your health care provider may recommend certain vaccines, such as:  Influenza vaccine. This is recommended every year.  Tetanus, diphtheria, and acellular pertussis (Tdap, Td) vaccine. You may need a Td booster every 10 years.  Varicella vaccine. You may need this if you have not been vaccinated.  HPV vaccine. If you are 91 or younger, you may need three doses over 6 months.  Measles, mumps, and rubella (MMR) vaccine. You may need at least one dose of MMR. You may also need a second dose.  Pneumococcal 13-valent conjugate (PCV13) vaccine. You may need this if you have certain conditions and were not previously vaccinated.  Pneumococcal polysaccharide (PPSV23) vaccine. You may need one or two doses if you smoke cigarettes or if you have certain conditions.  Meningococcal vaccine. One dose is recommended if you are age 28-21 years and a first-year college student living in a residence hall, or if you have one of several medical conditions. You may also need  additional booster doses.  Hepatitis A vaccine. You may need this if you have certain conditions or if you travel or work in places where you may be exposed to hepatitis A.  Hepatitis B vaccine. You may need this if you have certain conditions or if you travel or work in places where you may be exposed to hepatitis B.  Haemophilus influenzae type b (Hib) vaccine. You may need this if you have certain risk factors.  Talk to your health care provider about which screenings and  vaccines you need and how often you need them. This information is not intended to replace advice given to you by your health care provider. Make sure you discuss any questions you have with your health care provider. Document Released: 01/26/2002 Document Revised: 08/19/2016 Document Reviewed: 10/01/2015 Elsevier Interactive Patient Education  2017 [place].      Generalized Anxiety Disorder, Adult Generalized anxiety disorder (GAD) is a mental health disorder. People with this condition constantly worry about everyday events. Unlike normal anxiety, worry related to GAD is not triggered by a specific event. These worries also do not fade or get better with time. GAD interferes with life functions, including relationships, work, and school. GAD can vary from mild to severe. People with severe GAD can have intense waves of anxiety with physical symptoms (panic attacks). What are the causes? The exact cause of GAD is not known. What increases the risk? This condition is more likely to develop in:  Women.  People who have a family history of anxiety disorders.  People who are very shy.  People who experience very stressful life events, such as the death of a loved one.  People who have a very stressful family environment.  What are the signs or symptoms? People with GAD often worry excessively about many things in their lives, such as their health and family. They may also be overly concerned about:  Doing well at work.  Being on time.  Natural disasters.  Friendships.  Physical symptoms of GAD include:  Fatigue.  Muscle tension or having muscle twitches.  Trembling or feeling shaky.  Being easily startled.  Feeling like your heart is pounding or racing.  Feeling out of breath or like you cannot take a deep breath.  Having trouble falling asleep or staying asleep.  Sweating.  Nausea, diarrhea, or irritable bowel syndrome (IBS).  Headaches.  Trouble  concentrating or remembering facts.  Restlessness.  Irritability.  How is this diagnosed? Your health care provider can diagnose GAD based on your symptoms and medical history. You will also have a physical exam. The health care provider will ask specific questions about your symptoms, including how severe they are, when they started, and if they come and go. Your health care provider may ask you about your use of alcohol or drugs, including prescription medicines. Your health care provider may refer you to a mental health specialist for further evaluation. Your health care provider will do a thorough examination and may perform additional tests to rule out other possible causes of your symptoms. To be diagnosed with GAD, a person must have anxiety that:  Is out of his or her control.  Affects several different aspects of his or her life, such as work and relationships.  Causes distress that makes him or her unable to take part in normal activities.  Includes at least three physical symptoms of GAD, such as restlessness, fatigue, trouble concentrating, irritability, muscle tension, or sleep problems.  Before your health care  provider can confirm a diagnosis of GAD, these symptoms must be present more days than they are not, and they must last for six months or longer. How is this treated? The following therapies are usually used to treat GAD:  Medicine. Antidepressant medicine is usually prescribed for long-term daily control. Antianxiety medicines may be added in severe cases, especially when panic attacks occur.  Talk therapy (psychotherapy). Certain types of talk therapy can be helpful in treating GAD by providing support, education, and guidance. Options include: ? Cognitive behavioral therapy (CBT). People learn coping skills and techniques to ease their anxiety. They learn to identify unrealistic or negative thoughts and behaviors and to replace them with positive ones. ? Acceptance  and commitment therapy (ACT). This treatment teaches people how to be mindful as a way to cope with unwanted thoughts and feelings. ? Biofeedback. This process trains you to manage your body's response (physiological response) through breathing techniques and relaxation methods. You will work with a therapist while machines are used to monitor your physical symptoms.  Stress management techniques. These include yoga, meditation, and exercise.  A mental health specialist can help determine which treatment is best for you. Some people see improvement with one type of therapy. However, other people require a combination of therapies. Follow these instructions at home:  Take over-the-counter and prescription medicines only as told by your health care provider.  Try to maintain a normal routine.  Try to anticipate stressful situations and allow extra time to manage them.  Practice any stress management or self-calming techniques as taught by your health care provider.  Do not punish yourself for setbacks or for not making progress.  Try to recognize your accomplishments, even if they are small.  Keep all follow-up visits as told by your health care provider. This is important. Contact a health care provider if:  Your symptoms do not get better.  Your symptoms get worse.  You have signs of depression, such as: ? A persistently sad, cranky, or irritable mood. ? Loss of enjoyment in activities that used to bring you joy. ? Change in weight or eating. ? Changes in sleeping habits. ? Avoiding friends or family members. ? Loss of energy for normal tasks. ? Feelings of guilt or worthlessness. Get help right away if:  You have serious thoughts about hurting yourself or others. If you ever feel like you may hurt yourself or others, or have thoughts about taking your own life, get help right away. You can go to your nearest emergency department or call:  Your local emergency services (911 in  the [place]  A suicide crisis helpline, such as the [facility] at [phone]. This is open 24 hours a day.  Summary  Generalized anxiety disorder (GAD) is a mental health disorder that involves worry that is not triggered by a specific event.  People with GAD often worry excessively about many things in their lives, such as their health and family.  GAD may cause physical symptoms such as restlessness, trouble concentrating, sleep problems, frequent sweating, nausea, diarrhea, headaches, and trembling or muscle twitching.  A mental health specialist can help determine which treatment is best for you. Some people see improvement with one type of therapy. However, other people require a combination of therapies. This information is not intended to replace advice given to you by your health care provider. Make sure you discuss any questions you have with your health care provider. Document Released: 03/27/2013 Document Revised: 10/20/2016 Document Reviewed: 10/20/2016  Chartered certified accountant Patient Education  [employer].

## 2017-10-16 LAB — GLUCOSE, RANDOM: Glucose, Bld: 87 mg/dL (ref 65–99)

## 2017-10-25 ENCOUNTER — Ambulatory Visit (INDEPENDENT_AMBULATORY_CARE_PROVIDER_SITE_OTHER): Payer: BLUE CROSS/BLUE SHIELD | Admitting: Internal Medicine

## 2017-10-25 DIAGNOSIS — R0600 Dyspnea, unspecified: Secondary | ICD-10-CM

## 2017-10-25 LAB — PULMONARY FUNCTION TEST
DL/VA % PRED: 115 %
DL/VA: 5.25 ml/min/mmHg/L
DLCO COR % PRED: 116 %
DLCO COR: 25.12 ml/min/mmHg
DLCO unc % pred: 124 %
DLCO unc: 26.8 ml/min/mmHg
FEF 25-75 POST: 2.83 L/s
FEF 25-75 Pre: 3 L/sec
FEF2575-%CHANGE-POST: -5 %
FEF2575-%PRED-POST: 83 %
FEF2575-%PRED-PRE: 88 %
FEV1-%Change-Post: -1 %
FEV1-%Pred-Post: 99 %
FEV1-%Pred-Pre: 100 %
FEV1-POST: 3 L
FEV1-Pre: 3.05 L
FEV1FVC-%CHANGE-POST: 1 %
FEV1FVC-%PRED-PRE: 95 %
FEV6-%CHANGE-POST: -3 %
FEV6-%Pred-Post: 103 %
FEV6-%Pred-Pre: 107 %
FEV6-Post: 3.65 L
FEV6-Pre: 3.78 L
FEV6FVC-%Pred-Post: 101 %
FEV6FVC-%Pred-Pre: 101 %
FVC-%Change-Post: -3 %
FVC-%PRED-POST: 103 %
FVC-%Pred-Pre: 106 %
FVC-PRE: 3.78 L
FVC-Post: 3.65 L
POST FEV1/FVC RATIO: 82 %
PRE FEV1/FVC RATIO: 81 %
Post FEV6/FVC ratio: 100 %
Pre FEV6/FVC Ratio: 100 %
RV % pred: 121 %
RV: 1.53 L
TLC % PRED: 108 %
TLC: 5.17 L

## 2017-10-25 NOTE — Progress Notes (Signed)
PFT done today by Maie Kesinger, CMA  

## 2017-11-01 ENCOUNTER — Other Ambulatory Visit: Payer: Self-pay | Admitting: Internal Medicine

## 2017-11-01 MED ORDER — CITALOPRAM HYDROBROMIDE 10 MG PO TABS
10.0000 mg | ORAL_TABLET | Freq: Every day | ORAL | 1 refills | Status: DC
Start: 2017-11-01 — End: 2017-12-31

## 2017-12-15 ENCOUNTER — Ambulatory Visit (INDEPENDENT_AMBULATORY_CARE_PROVIDER_SITE_OTHER): Payer: BLUE CROSS/BLUE SHIELD | Admitting: Nurse Practitioner

## 2017-12-15 ENCOUNTER — Encounter: Payer: Self-pay | Admitting: Nurse Practitioner

## 2017-12-15 VITALS — BP 126/90 | HR 91 | Temp 98.0°F | Ht 62.25 in | Wt 232.0 lb

## 2017-12-15 DIAGNOSIS — J9801 Acute bronchospasm: Secondary | ICD-10-CM

## 2017-12-15 DIAGNOSIS — H6503 Acute serous otitis media, bilateral: Secondary | ICD-10-CM | POA: Diagnosis not present

## 2017-12-15 DIAGNOSIS — J101 Influenza due to other identified influenza virus with other respiratory manifestations: Secondary | ICD-10-CM | POA: Diagnosis not present

## 2017-12-15 DIAGNOSIS — B349 Viral infection, unspecified: Secondary | ICD-10-CM

## 2017-12-15 LAB — POC INFLUENZA A&B (BINAX/QUICKVUE)
INFLUENZA A, POC: POSITIVE — AB
INFLUENZA B, POC: NEGATIVE

## 2017-12-15 MED ORDER — ALBUTEROL SULFATE HFA 108 (90 BASE) MCG/ACT IN AERS: 1.0000 | INHALATION_SPRAY | Freq: Four times a day (QID) | RESPIRATORY_TRACT | 0 refills | Status: DC | PRN

## 2017-12-15 MED ORDER — ALBUTEROL SULFATE (2.5 MG/3ML) 0.083% IN NEBU
2.5000 mg | INHALATION_SOLUTION | Freq: Once | RESPIRATORY_TRACT | Status: AC
  Administered 2017-12-15: 2.5 mg via RESPIRATORY_TRACT

## 2017-12-15 MED ORDER — LEVOFLOXACIN 500 MG PO TABS: 500.0000 mg | ORAL_TABLET | Freq: Every day | ORAL | 0 refills | Status: DC

## 2017-12-15 MED ORDER — METHYLPREDNISOLONE ACETATE 40 MG/ML IJ SUSP
40.0000 mg | Freq: Once | INTRAMUSCULAR | Status: AC
  Administered 2017-12-15: 40 mg via INTRAMUSCULAR

## 2017-12-15 MED ORDER — IPRATROPIUM BROMIDE 0.03 % NA SOLN: 2.0000 | Freq: Two times a day (BID) | NASAL | 0 refills | Status: DC

## 2017-12-15 MED ORDER — OSELTAMIVIR PHOSPHATE 75 MG PO CAPS: 75.0000 mg | ORAL_CAPSULE | Freq: Two times a day (BID) | ORAL | 0 refills | Status: DC

## 2017-12-15 MED ORDER — DM-GUAIFENESIN ER 30-600 MG PO TB12: 1.0000 | ORAL_TABLET | Freq: Two times a day (BID) | ORAL | 0 refills | Status: DC | PRN

## 2017-12-15 MED ORDER — HYDROCODONE-HOMATROPINE 5-1.5 MG/5ML PO SYRP: 5.0000 mL | ORAL_SOLUTION | Freq: Four times a day (QID) | ORAL | 0 refills | Status: DC | PRN

## 2017-12-15 NOTE — Progress Notes (Signed)
Subjective:  Patient ID: Tina Mckee, female    DOB: 01/28/1991  Age: 27 y.o. MRN: [id number]  CC: Cough (sore throat,coughing yelow/green mucus,congestion,ears stop up,bodyache/---3 days/ kids has bronchitis and pneumonia/ note for work?)   Cough  This is a new problem. The current episode started in the past 7 days. The problem has been rapidly worsening. The problem occurs constantly. The cough is productive of purulent sputum. Associated symptoms include chills, a fever, headaches, myalgias, nasal congestion, postnasal drip, rhinorrhea, a sore throat, shortness of breath and wheezing. Pertinent negatives include no chest pain. The symptoms are aggravated by lying down and cold air. She has tried OTC cough suppressant for the symptoms. The treatment provided no relief. Her past medical history is significant for asthma.  also has bilateral ear pain.  Outpatient Medications Prior to Visit  Medication Sig Dispense Refill  . aspirin-acetaminophen-caffeine (EXCEDRIN MIGRAINE) 250-250-65 MG tablet Take 2 tablets by mouth every 6 (six) hours as needed for headache.    . citalopram (CELEXA) 10 MG tablet Take 1 tablet (10 mg total) daily by mouth. 30 tablet 1  . ibuprofen (ADVIL,MOTRIN) 200 MG tablet Take 400 mg by mouth every 6 (six) hours as needed for headache.    Marland Kitchen PARAGARD INTRAUTERINE COPPER IU by Intrauterine route.    Marland Kitchen albuterol (PROVENTIL HFA;VENTOLIN HFA) 108 (90 BASE) MCG/ACT inhaler Inhale 2 puffs into the lungs every 4 (four) hours as needed for wheezing or shortness of breath. 1 Inhaler 2   No facility-administered medications prior to visit.     ROS See HPI  Objective:  BP 126/90   Pulse 91   Temp 98 F (36.7 C) (Oral)   Ht 5' 2.25" (1.581 m)   Wt 232 lb (105.2 kg)   SpO2 98%   BMI 42.09 kg/m   BP Readings from Last 3 Encounters:  12/15/17 126/90  10/15/17 (!) 132/96  10/07/17 120/80    Wt Readings from Last 3 Encounters:  12/15/17 232 lb (105.2 kg)    10/15/17 239 lb 6.4 oz (108.6 kg)  10/23/16 225 lb (102.1 kg)    Physical Exam  Constitutional: She is oriented to person, place, and time. No distress.  HENT:  Right Ear: No mastoid tenderness. Tympanic membrane is injected and erythematous. Tympanic membrane is not perforated. A middle ear effusion is present.  Left Ear: No mastoid tenderness. Tympanic membrane is injected and erythematous. Tympanic membrane is not perforated. A middle ear effusion is present.  Nose: Mucosal edema and rhinorrhea present. Right sinus exhibits maxillary sinus tenderness and frontal sinus tenderness. Left sinus exhibits maxillary sinus tenderness and frontal sinus tenderness.  Mouth/Throat: Uvula is midline. No trismus in the jaw. Posterior oropharyngeal erythema present. No oropharyngeal exudate.  Neck: Normal range of motion. Neck supple.  Cardiovascular: Normal rate and normal heart sounds.  Pulmonary/Chest: Effort normal. No respiratory distress. She has wheezes. She has rales.  Musculoskeletal: She exhibits no edema.  Lymphadenopathy:    She has cervical adenopathy.  Neurological: She is alert and oriented to person, place, and time.  Vitals reviewed.   Lab Results  Component Value Date   WBC 10.3 10/07/2017   HGB 14.4 10/07/2017   HCT 42.5 10/07/2017   PLT 439 (H) 10/07/2017   GLUCOSE 87 10/15/2017   CHOL 186 10/15/2017   TRIG 204.0 (H) 10/15/2017   HDL 43.50 10/15/2017   LDLDIRECT 180.0 10/15/2017   LDLCALC 115 (H) 10/23/2016   ALT 17 10/23/2016   AST 13 10/23/2016  NA 140 10/07/2017   K 3.6 10/07/2017   CL 109 10/07/2017   CREATININE 0.73 10/07/2017   BUN 14 10/07/2017   CO2 22 10/07/2017   TSH 1.59 10/15/2017   HGBA1C 5.4 10/15/2017   MICROALBUR 0.9 09/20/2012    Dg Chest 2 View  Result Date: 10/07/2017 CLINICAL DATA:  Chest pain and shortness of breath EXAM: CHEST  2 VIEW COMPARISON:  May 13, 2016 FINDINGS: Lungs are clear. Heart size and pulmonary vascularity are normal.  No adenopathy. No pneumothorax. No bone lesions. IMPRESSION: No edema or consolidation. Electronically Signed   By: Lowella Grip III M.D.   On: 10/07/2017 14:27    Assessment & Plan:   Tina Mckee was seen today for cough.  Diagnoses and all orders for this visit:  Influenza A -     POC Influenza A&B(BINAX/QUICKVUE) -     albuterol (PROVENTIL) (2.5 MG/3ML) 0.083% nebulizer solution 2.5 mg -     dextromethorphan-guaiFENesin (MUCINEX DM) 30-600 MG 12hr tablet; Take 1 tablet by mouth 2 (two) times daily as needed for cough. -     levofloxacin (LEVAQUIN) 500 MG tablet; Take 1 tablet (500 mg total) by mouth daily. -     methylPREDNISolone acetate (DEPO-MEDROL) injection 40 mg -     albuterol (PROVENTIL HFA;VENTOLIN HFA) 108 (90 Base) MCG/ACT inhaler; Inhale 1-2 puffs into the lungs every 6 (six) hours as needed for wheezing or shortness of breath. -     oseltamivir (TAMIFLU) 75 MG capsule; Take 1 capsule (75 mg total) by mouth 2 (two) times daily. -     HYDROcodone-homatropine (HYCODAN) 5-1.5 MG/5ML syrup; Take 5 mLs by mouth every 6 (six) hours as needed for cough. -     ipratropium (ATROVENT) 0.03 % nasal spray; Place 2 sprays into both nostrils 2 (two) times daily. Do not use for more than 5days.  Acute bronchospasm due to viral infection -     albuterol (PROVENTIL) (2.5 MG/3ML) 0.083% nebulizer solution 2.5 mg -     oseltamivir (TAMIFLU) 75 MG capsule; Take 1 capsule (75 mg total) by mouth 2 (two) times daily. -     HYDROcodone-homatropine (HYCODAN) 5-1.5 MG/5ML syrup; Take 5 mLs by mouth every 6 (six) hours as needed for cough.  Bilateral acute serous otitis media, recurrence not specified -     levofloxacin (LEVAQUIN) 500 MG tablet; Take 1 tablet (500 mg total) by mouth daily.   I have changed Tina Mckee's albuterol. I am also having her start on dextromethorphan-guaiFENesin, levofloxacin, oseltamivir, HYDROcodone-homatropine, and ipratropium. Additionally, I am having her  maintain her PARAGARD INTRAUTERINE COPPER IU, ibuprofen, aspirin-acetaminophen-caffeine, and citalopram. We administered albuterol and methylPREDNISolone acetate.  Meds ordered this encounter  Medications  . albuterol (PROVENTIL) (2.5 MG/3ML) 0.083% nebulizer solution 2.5 mg  . dextromethorphan-guaiFENesin (MUCINEX DM) 30-600 MG 12hr tablet    Sig: Take 1 tablet by mouth 2 (two) times daily as needed for cough.    Dispense:  14 tablet    Refill:  0    Order Specific Question:   Supervising Provider    Answer:   Lucille Passy [[id number]]  . levofloxacin (LEVAQUIN) 500 MG tablet    Sig: Take 1 tablet (500 mg total) by mouth daily.    Dispense:  7 tablet    Refill:  0    Order Specific Question:   Supervising Provider    Answer:   Lucille Passy [[id number]]  . methylPREDNISolone acetate (DEPO-MEDROL) injection 40 mg  . albuterol (PROVENTIL HFA;VENTOLIN HFA)  108 (90 Base) MCG/ACT inhaler    Sig: Inhale 1-2 puffs into the lungs every 6 (six) hours as needed for wheezing or shortness of breath.    Dispense:  1 Inhaler    Refill:  0    Order Specific Question:   Supervising Provider    Answer:   Lucille Passy [[id number]]  . oseltamivir (TAMIFLU) 75 MG capsule    Sig: Take 1 capsule (75 mg total) by mouth 2 (two) times daily.    Dispense:  10 capsule    Refill:  0    Order Specific Question:   Supervising Provider    Answer:   Lucille Passy [[id number]]  . HYDROcodone-homatropine (HYCODAN) 5-1.5 MG/5ML syrup    Sig: Take 5 mLs by mouth every 6 (six) hours as needed for cough.    Dispense:  60 mL    Refill:  0    Order Specific Question:   Supervising Provider    Answer:   Lucille Passy [[id number]]  . ipratropium (ATROVENT) 0.03 % nasal spray    Sig: Place 2 sprays into both nostrils 2 (two) times daily. Do not use for more than 5days.    Dispense:  30 mL    Refill:  0    Order Specific Question:   Supervising Provider    Answer:   Lucille Passy [[id number]]    Follow-up: Return if symptoms worsen or fail to  improve.  Wilfred Lacy, NP

## 2017-12-15 NOTE — Patient Instructions (Signed)

## 2017-12-22 ENCOUNTER — Encounter: Payer: Self-pay | Admitting: Internal Medicine

## 2017-12-22 NOTE — Telephone Encounter (Signed)
Please advise Dr Panosh, thanks.   

## 2017-12-22 NOTE — Telephone Encounter (Signed)
You were place on an antiviral and a broad spectrum antibiotic that would have  Treated any bacterial ear sinus infection .   I suspect the sx are still from the FLU Sometimes the medications give headaches    If still having a fever or   Any shortness of breath   Would advise can recheck other wise  Hydration .   Rest  And  ROV

## 2017-12-31 ENCOUNTER — Other Ambulatory Visit: Payer: Self-pay | Admitting: Internal Medicine

## 2018-06-09 DIAGNOSIS — D225 Melanocytic nevi of trunk: Secondary | ICD-10-CM | POA: Diagnosis not present

## 2018-06-09 DIAGNOSIS — L218 Other seborrheic dermatitis: Secondary | ICD-10-CM | POA: Diagnosis not present

## 2018-06-09 DIAGNOSIS — L821 Other seborrheic keratosis: Secondary | ICD-10-CM | POA: Diagnosis not present

## 2018-06-09 DIAGNOSIS — L814 Other melanin hyperpigmentation: Secondary | ICD-10-CM | POA: Diagnosis not present

## 2018-06-09 DIAGNOSIS — L905 Scar conditions and fibrosis of skin: Secondary | ICD-10-CM | POA: Diagnosis not present

## 2018-10-03 ENCOUNTER — Other Ambulatory Visit: Payer: Self-pay | Admitting: Internal Medicine

## 2018-10-03 ENCOUNTER — Ambulatory Visit (INDEPENDENT_AMBULATORY_CARE_PROVIDER_SITE_OTHER): Payer: Managed Care, Other (non HMO)

## 2018-10-03 ENCOUNTER — Ambulatory Visit: Payer: Managed Care, Other (non HMO) | Admitting: Internal Medicine

## 2018-10-03 ENCOUNTER — Encounter: Payer: Self-pay | Admitting: Internal Medicine

## 2018-10-03 VITALS — BP 142/100 | HR 84 | Temp 97.9°F | Wt 244.6 lb

## 2018-10-03 DIAGNOSIS — M79602 Pain in left arm: Secondary | ICD-10-CM

## 2018-10-03 DIAGNOSIS — Z803 Family history of malignant neoplasm of breast: Secondary | ICD-10-CM | POA: Diagnosis not present

## 2018-10-03 DIAGNOSIS — N644 Mastodynia: Secondary | ICD-10-CM

## 2018-10-03 DIAGNOSIS — R200 Anesthesia of skin: Secondary | ICD-10-CM

## 2018-10-03 DIAGNOSIS — I69351 Hemiplegia and hemiparesis following cerebral infarction affecting right dominant side: Secondary | ICD-10-CM | POA: Diagnosis not present

## 2018-10-03 DIAGNOSIS — R030 Elevated blood-pressure reading, without diagnosis of hypertension: Secondary | ICD-10-CM

## 2018-10-03 LAB — POCT URINE PREGNANCY: Preg Test, Ur: NEGATIVE

## 2018-10-03 NOTE — Patient Instructions (Addendum)
Numbness seems to be positional   And pinched nerve  Exam is reassuring   At this time   Monitor sleep position and  Follow   If  persistent or progressive will do more evaluation.   will be contacted about  mammogram and [place] of breast .   Poss to get a breast infection with pain but  Cannot tell at this time   Establish with new OB gyne as discussed   Get chest x ray today   bp up today will readresss at FU  cpx

## 2018-10-03 NOTE — Progress Notes (Signed)
Chief Complaint  Patient presents with  . Breast Pain    Breast tenderness x 1 month or more. Pt states that pain is constant. Burning sensation at times around her nipple. Having some left arm numbness when laying down. Pt states that she thinks that she has a knot on the inner breast toward center of chest.     HPI: Tina Mckee 27 y.o. come in for Chronic disease management  left side breast  And  Numb on left sidearm when  to sleep   Hands tingle.  Sitting in recliner.   No neck pain  new weakness( old r hemiparesis from stroke) Here with husband   concen  fam hx breast cancer but  ? Post menopausal   Trying to  Lose weight  nune diet  Needs new obgyn.  Hx of cyst  In underarm.  Periods are fine   Recent  Trip pr  Oct  Early .  Has iud .  2016  No reg work.  Now       Mild tremor getting owrse left hand  No neck pain  Or new injury repetetive motion ROS: See pertinent positives and negatives per HPI.  Past Medical History:  Diagnosis Date  . Asthma   . Gestational hypertension   . Perinatal arterial ischemic stroke ([place])   . Scoliosis   . Stroke [facility]) birth   rt hemiparesis  Perinatal  . Supervision of other normal pregnancy 05/04/2014    Family History  Problem Relation Age of Onset  . Hypertension Mother   . Asthma Mother   . Hypertension Father   . Asthma Sister   . Cancer Maternal Grandfather        pancreatic  . Cancer Paternal Grandmother        cancer all over  . Other Paternal Grandfather        brain damage  . Other Maternal Grandmother        heart problems    Social History   Socioeconomic History  . Marital status: Married    Spouse name: Not on file  . Number of children: 2  . Years of education: Not on file  . Highest education level: Not on file  Occupational History  . Occupation: Therapist, art  Social Needs  . Financial resource strain: Not on file  . Food insecurity:    Worry: Not on file    Inability: Not on file  .  Transportation needs:    Medical: Not on file    Non-medical: Not on file  Tobacco Use  . Smoking status: Former Smoker    Types: Cigarettes    Last attempt to quit: 07/11/2007    Years since quitting: 11.2  . Smokeless tobacco: Never Used  Substance and Sexual Activity  . Alcohol use: Yes    Alcohol/week: 1.0 standard drinks    Types: 1 Standard drinks or equivalent per week    Comment: Every other day  . Drug use: No  . Sexual activity: Yes    Birth control/protection: IUD  Lifestyle  . Physical activity:    Days per week: Not on file    Minutes per session: Not on file  . Stress: Not on file  Relationships  . Social connections:    Talks on phone: Not on file    Gets together: Not on file    Attends religious service: Not on file    Active member of club or organization: Not on file  Attends meetings of clubs or organizations: Not on file    Relationship status: Not on file  Other Topics Concern  . 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     Currently nonsmoking    HH of 4 no pets     had mild preeclampsia with vaginal childbirth.  2011   cb 12 15 preeclampsi no complicatinos   ocass etoh.    Exercise  Walking     Sleep ok    caffiene limited per day .              Allergies as of 10/03/2018      Reactions   Cephalexin Rash      Medication List        Accurate as of 10/03/18  6:42 PM. Always use your most recent med list.          albuterol 108 (90 Base) MCG/ACT inhaler Commonly known as:  PROVENTIL HFA;VENTOLIN HFA Inhale 1-2 puffs into the lungs every 6 (six) hours as needed for wheezing or shortness of breath.   aspirin-acetaminophen-caffeine 250-250-65 MG tablet Commonly known as:  EXCEDRIN MIGRAINE Take 2 tablets by mouth every 6 (six) hours as needed for headache.   citalopram 10 MG tablet Commonly known as:  CELEXA TAKE 1 TABLET (10 MG TOTAL) DAILY  BY MOUTH.   ibuprofen 200 MG tablet Commonly known as:  ADVIL,MOTRIN Take 400 mg by mouth every 6 (six) hours as needed for headache.   ONE-A-DAY WOMENS PO Take 1 tablet by mouth daily.   PARAGARD INTRAUTERINE COPPER IU by Intrauterine route.         EXAM:  BP (!) 142/100 (BP Location: Left Wrist, Patient Position: Sitting, Cuff Size: Normal)   Pulse 84   Temp 97.9 F (36.6 C) (Oral)   Wt 244 lb 9.6 oz (110.9 kg)   LMP 09/15/2018   BMI 44.38 kg/m   Body mass index is 44.38 kg/m.  GENERAL: vitals reviewed and listed above, alert, oriented, appears well hydrated and in no acute distress HEENT: atraumatic, conjunctiva  clear, no obvious abnormalities on inspection of external nose and ears NECK: no obvious masses on inspection palpation [place] area clear  LUNGS: clear to auscultation bilaterally, no wheezes, rales or rhonchi, good air movement CV: HRRR, no clubbing cyanosis or  peripheral edema nl cap refill  Breast: normal by inspection . No dimpling, discharge,or discharge . tedner and painful around 7 oclock r breast thickened area but no redness streaking  Or  Nodule   Axilla clear  Right breast no sx  MS: moves all extremities u atrophy good motor LUE   Fine mild tremor ue  No rigidity lue PSYCH: pleasant and cooperative, no obvious depression or anxiety  BP Readings from Last 3 Encounters:  10/03/18 (!) 142/100  12/15/17 126/90  10/15/17 (!) 132/96   Wt Readings from Last 3 Encounters:  10/03/18 244 lb 9.6 oz (110.9 kg)  12/15/17 232 lb (105.2 kg)  10/15/17 239 lb 6.4 oz (108.6 kg)   upt neg  ASSESSMENT AND PLAN:  Discussed the following assessment and plan:  Breast tenderness in female - Plan: POCT urine pregnancy, DG Chest 2 View, MM DIAG BREAST TOMO UNI LEFT, US BREAST LTD UNI LEFT INC AXILLA  Pain and numbness of left upper extremity - Plan: DG Chest 2 View, MM DIAG BREAST TOMO  UNI LEFT, US BREAST LTD UNI LEFT INC AXILLA  Family history of malignant  neoplasm of breast - Plan: MM DIAG BREAST TOMO UNI LEFT, US BREAST LTD UNI LEFT INC AXILLA  Hemiparesis affecting right side as late effect of cerebrovascular accident ([place])  Elevated BP without diagnosis of hypertension Uncertain cause but done think the positional numbness is related?  exam unrevealing  Has   Localized breast tenderness  Will evaluated and keep  Fu visit cpx   elev bp will need fu  -Patient advised to return or notify health care team  if  new concerns arise.  Patient Instructions  Numbness seems to be positional   And pinched nerve  Exam is reassuring   At this time   Monitor sleep position and  Follow   If  persistent or progressive will do more evaluation.   will be contacted about  mammogram and [place] of breast .   Poss to get a breast infection with pain but  Cannot tell at this time   Establish with new OB gyne as discussed   Get chest x ray today   bp up today will readresss at [place]. Tina Mckee M.D.

## 2018-10-13 ENCOUNTER — Ambulatory Visit
Admission: RE | Admit: 2018-10-13 | Discharge: 2018-10-13 | Disposition: A | Discharge status: Home / Self Care | Payer: Managed Care, Other (non HMO) | Source: Ambulatory Visit | Attending: Internal Medicine | Admitting: Internal Medicine

## 2018-10-13 ENCOUNTER — Other Ambulatory Visit: Payer: Self-pay | Admitting: Internal Medicine

## 2018-10-13 ENCOUNTER — Other Ambulatory Visit: Payer: Managed Care, Other (non HMO)

## 2018-10-13 ENCOUNTER — Other Ambulatory Visit: Payer: Self-pay

## 2018-10-13 DIAGNOSIS — R200 Anesthesia of skin: Secondary | ICD-10-CM

## 2018-10-13 DIAGNOSIS — M79602 Pain in left arm: Secondary | ICD-10-CM

## 2018-10-13 DIAGNOSIS — N644 Mastodynia: Secondary | ICD-10-CM

## 2018-10-13 DIAGNOSIS — Z803 Family history of malignant neoplasm of breast: Secondary | ICD-10-CM

## 2018-10-13 DIAGNOSIS — N6002 Solitary cyst of left breast: Secondary | ICD-10-CM

## 2018-10-18 NOTE — Progress Notes (Signed)
Chief Complaint  Patient presents with  . Annual Exam    HPI: Patient  Tina Mckee  27 y.o. comes in today for [place] visit  Arm is better    Breast US showed some apocrin poss metaplasia  And to fy in 6 mos    Feels better check right axilla nodule    Health Maintenance  Topic Date Due  . INFLUENZA VACCINE  07/14/2018  . PAP SMEAR  10/14/2019  . TETANUS/TDAP  11/14/2024  . HIV Screening  Completed   Health Maintenance Review LIFESTYLE:  Exercise:  [place].   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 GI /GU: No adominal pain, vomiting, change in bowel habits. No blood in the stool. No significant GU symptoms. SKIN/HEME: ,no acute skin rashes suspicious lesions or bleeding. No lymphadenopathy, nodules, masses.  NEURO/ PSYCH:  No neurologic signs such as weakness numbness. No depression anxiety. IMM/ Allergy: No unusual infections.  Allergy .   REST of 12 system review negative except as per HPI   Past Medical History:  Diagnosis Date  . Asthma   . Gestational hypertension   . Perinatal arterial ischemic stroke ([place])   . Scoliosis   . Stroke [facility]) birth   rt hemiparesis  Perinatal  . Supervision of other normal pregnancy 05/04/2014    Past Surgical History:  Procedure Laterality Date  . FOOT SURGERY     rt foot  . FOOT SURGERY     Bone placed  . moles removed     from back    Family History  Problem Relation Age of Onset  . Hypertension Mother   . Asthma Mother   . Hypertension Father   . Asthma Sister   . Cancer Maternal Grandfather        pancreatic    . Cancer Paternal Grandmother        cancer all over  . Other Paternal Grandfather        brain damage  . Other Maternal Grandmother        heart problems    Social History   Socioeconomic History  . Marital status: Married    Spouse name: Not on file  . Number of children: 2  . Years of education: Not on file  . Highest education level: Not on file  Occupational History  . Occupation: Therapist, art  Social Needs  . Financial resource strain: Not on file  . Food insecurity:    Worry: Not on file    Inability: Not on file  . Transportation needs:    Medical: Not on file    Non-medical: Not on file  Tobacco Use  . Smoking status: Former Smoker    Types: Cigarettes    Last attempt to quit: 07/11/2007    Years since quitting: 11.2  . Smokeless tobacco: Never Used  Substance and Sexual Activity  . Alcohol use: Yes    Alcohol/week: 1.0 standard drinks    Types: 1 Standard drinks or equivalent per week    Comment: Every other day  .  Drug use: No  . Sexual activity: Yes    Birth control/protection: IUD  Lifestyle  . Physical activity:    Days per week: Not on file    Minutes per session: Not on file  . Stress: Not on file  Relationships  . Social connections:    Talks on phone: Not on file    Gets together: Not on file    Attends religious service: Not on file    Active member of club or organization: Not on file    Attends meetings of clubs or organizations: Not on file    Relationship status: Not on file  Other Topics Concern  . 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     Currently nonsmoking    HH of 4 no pets     had mild preeclampsia with vaginal childbirth.  2011   cb 12 15 preeclampsi no complicatinos   ocass etoh.    Exercise  Walking     Sleep ok    caffiene limited per day .              Outpatient Medications Prior to Visit  Medication Sig Dispense  Refill  . albuterol (PROVENTIL HFA;VENTOLIN HFA) 108 (90 Base) MCG/ACT inhaler Inhale 1-2 puffs into the lungs every 6 (six) hours as needed for wheezing or shortness of breath. 1 Inhaler 0  . aspirin-acetaminophen-caffeine (EXCEDRIN MIGRAINE) 250-250-65 MG tablet Take 2 tablets by mouth every 6 (six) hours as needed for headache.    . citalopram (CELEXA) 10 MG tablet TAKE 1 TABLET (10 MG TOTAL) DAILY BY MOUTH. 30 tablet 5  . ibuprofen (ADVIL,MOTRIN) 200 MG tablet Take 400 mg by mouth every 6 (six) hours as needed for headache.    . Multiple Vitamins-Calcium (ONE-A-DAY WOMENS PO) Take 1 tablet by mouth daily.    Marland Kitchen PARAGARD INTRAUTERINE COPPER IU by Intrauterine route.     No facility-administered medications prior to visit.      EXAM:  BP 130/90 (BP Location: Left Arm, Patient Position: Sitting, Cuff Size: Large)   Pulse 72   Temp 98.3 F (36.8 C) (Oral)   Ht '5\' 2"'  (1.575 m)   Wt 235 lb 8 oz (106.8 kg)   SpO2 96%   BMI 43.07 kg/m   Body mass index is 43.07 kg/m. Wt Readings from Last 3 Encounters:  10/19/18 235 lb 8 oz (106.8 kg)  10/03/18 244 lb 9.6 oz (110.9 kg)  12/15/17 232 lb (105.2 kg)    Physical Exam: Vital signs reviewed VUY:EBXI is a well-developed well-nourished alert cooperative    who appearsr stated age in no acute distress.  HEENT: normocephalic atraumatic , Eyes: PERRL EOM's full, conjunctiva clear, Nares: paten,t no deformity discharge or tenderness., Ears: no deformity EAC's clear TMs with normal landmarks. Mouth: clear OP, no lesions, edema.  Moist mucous membranes. Dentition in adequate repair. NECK: supple without masses, thyromegaly or bruits. CHEST/PULM:  Clear to auscultation and percussion breath sounds equal no wheeze , rales or rhonchi. No chest wall deformities or tenderness. Breast: normal by inspection . No dimpling, discharge, masses, tenderness or discharge . r axilla  2-3 mm   Skin nodule ?  CV: PMI is nondisplaced, S1 S2 no gallops, murmurs,  rubs. Peripheral pulses are full without delay.No JVD .  ABDOMEN: Bowel sounds normal nontender  No guard or rebound, no hepato  splenomegal no CVA tenderness.  No hernia. Extremtities:  No clubbing cyanosis or edema, no acute joint swelling or redness some atrophy rue NEURO:  Oriented x3, cranial nerves 3-12 appear to be intact, righ hemiparesis independent wlaking  SKIN: No acute rashes normal turgor, color, no bruising or petechiae. PSYCH: Oriented, good eye contact, no obvious depression anxiety, cognition and judgment appear normal. LN: no cervical axillary inguinal adenopathy  Lab Results  Component Value Date   WBC 10.3 10/07/2017   HGB 14.4 10/07/2017   HCT 42.5 10/07/2017   PLT 439 (H) 10/07/2017   GLUCOSE 87 10/15/2017   CHOL 186 10/15/2017   TRIG 204.0 (H) 10/15/2017   HDL 43.50 10/15/2017   LDLDIRECT 180.0 10/15/2017   LDLCALC 115 (H) 10/23/2016   ALT 17 10/23/2016   AST 13 10/23/2016   NA 140 10/07/2017   K 3.6 10/07/2017   CL 109 10/07/2017   CREATININE 0.73 10/07/2017   BUN 14 10/07/2017   CO2 22 10/07/2017   TSH 1.59 10/15/2017   HGBA1C 5.4 10/15/2017   MICROALBUR 0.9 09/20/2012    BP Readings from Last 3 Encounters:  10/19/18 130/90  10/03/18 (!) 142/100  12/15/17 126/90   Wt Readings from Last 3 Encounters:  10/19/18 235 lb 8 oz (106.8 kg)  10/03/18 244 lb 9.6 oz (110.9 kg)  12/15/17 232 lb (105.2 kg)     ASSESSMENT AND PLAN:  Discussed the following assessment and plan:  Visit for preventive health examination - Plan: Basic metabolic panel, CBC with Differential/Platelet, Hepatic function panel, Lipid panel, TSH  Obesity (BMI 30-39.9) - Plan: Basic metabolic panel, CBC with Differential/Platelet, Hepatic function panel, Lipid panel, TSH  Hemiparesis affecting right side as late effect of cerebrovascular accident ([place])  Hyperlipidemia, unspecified hyperlipidemia type - Plan: Basic metabolic panel, CBC with Differential/Platelet, Hepatic function  panel, Lipid panel, TSH  History of stroke  Hemiparesis, right (HCC) - Plan: Basic metabolic panel, CBC with Differential/Platelet, Hepatic function panel, Lipid panel, TSH  BMI 40.0-44.9, adult [facility])  Patient Care Team: Archie Atilano, Standley Brooking, MD as PCP - General Gaynell Face Princess Bruins, MD (Neurology) Jonnie Kind, MD (Obstetrics and Gynecology) Marybelle Killings, MD (Orthopedic Surgery) wong (Neurology) Patient Instructions  Continue lifestyle intervention healthy eating and exercise .  bp goal is 120/80  But lifes tyle should help.   Will notify you  of labs when available.  Keep 6 mos [place] appt.   If bp  Is up and concnerning then plan fu with [place]   Otherwise  cpx in a year    Preventive Care 18-39 Years, Female Preventive care refers to lifestyle choices and visits with your health care provider that can promote health and wellness. What does preventive care include?  A yearly physical exam. This is also called an annual well check.  Dental exams once or twice a year.  Routine eye exams. Ask your health care provider how often you should have your eyes checked.  Personal lifestyle choices, including: ? Daily care of your teeth and gums. ? Regular physical activity. ? Eating a healthy diet. ? Avoiding tobacco and drug use. ? Limiting alcohol use. ? Practicing safe sex. ? Taking vitamin and mineral supplements as recommended by your health care provider. What happens during an annual well check? The services and screenings done by your health care provider during your annual well check will depend on your age, overall health, lifestyle risk factors, and family history of disease. Counseling Your health care provider may ask  you questions about your:  Alcohol use.  Tobacco use.  Drug use.  Emotional well-being.  Home and relationship well-being.  Sexual activity.  Eating habits.  Work and work Statistician.  Method of birth control.  Menstrual  cycle.  Pregnancy history.  Screening You may have the following tests or measurements:  Height, weight, and BMI.  Diabetes screening. This is done by checking your blood sugar (glucose) after you have not eaten for a while (fasting).  Blood pressure.  Lipid and cholesterol levels. These may be checked every 5 years starting at age 60.  Skin check.  Hepatitis C blood test.  Hepatitis B blood test.  Sexually transmitted disease (STD) testing.  BRCA-related cancer screening. This may be done if you have a family history of breast, ovarian, tubal, or peritoneal cancers.  Pelvic exam and Pap test. This may be done every 3 years starting at age 14. Starting at age 67, this may be done every 5 years if you have a Pap test in combination with an HPV test.  Discuss your test results, treatment options, and if necessary, the need for more tests with your health care provider. Vaccines Your health care provider may recommend certain vaccines, such as:  Influenza vaccine. This is recommended every year.  Tetanus, diphtheria, and acellular pertussis (Tdap, Td) vaccine. You may need a Td booster every 10 years.  Varicella vaccine. You may need this if you have not been vaccinated.  HPV vaccine. If you are 89 or younger, you may need three doses over 6 months.  Measles, mumps, and rubella (MMR) vaccine. You may need at least one dose of MMR. You may also need a second dose.  Pneumococcal 13-valent conjugate (PCV13) vaccine. You may need this if you have certain conditions and were not previously vaccinated.  Pneumococcal polysaccharide (PPSV23) vaccine. You may need one or two doses if you smoke cigarettes or if you have certain conditions.  Meningococcal vaccine. One dose is recommended if you are age 64-21 years and a first-year college student living in a residence hall, or if you have one of several medical conditions. You may also need additional booster doses.  Hepatitis A  vaccine. You may need this if you have certain conditions or if you travel or work in places where you may be exposed to hepatitis A.  Hepatitis B vaccine. You may need this if you have certain conditions or if you travel or work in places where you may be exposed to hepatitis B.  Haemophilus influenzae type b (Hib) vaccine. You may need this if you have certain risk factors.  Talk to your health care provider about which screenings and vaccines you need and how often you need them. This information is not intended to replace advice given to you by your health care provider. Make sure you discuss any questions you have with your health care provider. Document Released: 01/26/2002 Document Revised: 08/19/2016 Document Reviewed: 10/01/2015 Elsevier Interactive Patient Education  2018 [place]. Cheng Dec M.D.

## 2018-10-19 ENCOUNTER — Ambulatory Visit (INDEPENDENT_AMBULATORY_CARE_PROVIDER_SITE_OTHER): Payer: Managed Care, Other (non HMO) | Admitting: Internal Medicine

## 2018-10-19 ENCOUNTER — Encounter: Payer: Self-pay | Admitting: Internal Medicine

## 2018-10-19 VITALS — BP 130/90 | HR 72 | Temp 98.3°F | Ht 62.0 in | Wt 235.5 lb

## 2018-10-19 DIAGNOSIS — G8191 Hemiplegia, unspecified affecting right dominant side: Secondary | ICD-10-CM

## 2018-10-19 DIAGNOSIS — Z0000 Encounter for general adult medical examination without abnormal findings: Secondary | ICD-10-CM | POA: Diagnosis not present

## 2018-10-19 DIAGNOSIS — E669 Obesity, unspecified: Secondary | ICD-10-CM

## 2018-10-19 DIAGNOSIS — Z6841 Body Mass Index (BMI) 40.0 and over, adult: Secondary | ICD-10-CM

## 2018-10-19 DIAGNOSIS — Z8673 Personal history of transient ischemic attack (TIA), and cerebral infarction without residual deficits: Secondary | ICD-10-CM

## 2018-10-19 DIAGNOSIS — E785 Hyperlipidemia, unspecified: Secondary | ICD-10-CM

## 2018-10-19 DIAGNOSIS — I69351 Hemiplegia and hemiparesis following cerebral infarction affecting right dominant side: Secondary | ICD-10-CM | POA: Diagnosis not present

## 2018-10-19 LAB — CBC WITH DIFFERENTIAL/PLATELET
BASOS PCT: 0.6 % (ref 0.0–3.0)
Basophils Absolute: 0 10*3/uL (ref 0.0–0.1)
EOS PCT: 4.4 % (ref 0.0–5.0)
Eosinophils Absolute: 0.4 10*3/uL (ref 0.0–0.7)
HCT: 44.7 % (ref 36.0–46.0)
Hemoglobin: 15.6 g/dL — ABNORMAL HIGH (ref 12.0–15.0)
LYMPHS ABS: 2.8 10*3/uL (ref 0.7–4.0)
Lymphocytes Relative: 34.9 % (ref 12.0–46.0)
MCHC: 35 g/dL (ref 30.0–36.0)
MCV: 85.6 fl (ref 78.0–100.0)
MONO ABS: 0.5 10*3/uL (ref 0.1–1.0)
Monocytes Relative: 5.7 % (ref 3.0–12.0)
NEUTROS ABS: 4.3 10*3/uL (ref 1.4–7.7)
NEUTROS PCT: 54.4 % (ref 43.0–77.0)
PLATELETS: 437 10*3/uL — AB (ref 150.0–400.0)
RBC: 5.22 Mil/uL — ABNORMAL HIGH (ref 3.87–5.11)
RDW: 12.7 % (ref 11.5–15.5)
WBC: 8 10*3/uL (ref 4.0–10.5)

## 2018-10-19 LAB — BASIC METABOLIC PANEL
BUN: 19 mg/dL (ref 6–23)
CHLORIDE: 103 meq/L (ref 96–112)
CO2: 27 mEq/L (ref 19–32)
CREATININE: 0.58 mg/dL (ref 0.40–1.20)
Calcium: 9.6 mg/dL (ref 8.4–10.5)
GFR: 130.16 mL/min (ref >60.00)
Glucose, Bld: 92 mg/dL (ref 70–99)
POTASSIUM: 4.5 meq/L (ref 3.5–5.1)
Sodium: 138 mEq/L (ref 135–145)

## 2018-10-19 LAB — HEPATIC FUNCTION PANEL
ALT: 17 U/L (ref 0–35)
AST: 14 U/L (ref 0–37)
Albumin: 4.6 g/dL (ref 3.5–5.2)
Alkaline Phosphatase: 67 U/L (ref 39–117)
BILIRUBIN DIRECT: 0.2 mg/dL (ref 0.0–0.3)
BILIRUBIN TOTAL: 1.5 mg/dL — AB (ref 0.2–1.2)
Total Protein: 6.9 g/dL (ref 6.0–8.3)

## 2018-10-19 LAB — LIPID PANEL
CHOL/HDL RATIO: 5
CHOLESTEROL: 179 mg/dL (ref 0–200)
HDL: 33.7 mg/dL — AB (ref >39.00)
NonHDL: 144.96
TRIGLYCERIDES: 209 mg/dL — AB (ref 0.0–149.0)
VLDL: 41.8 mg/dL — AB (ref 0.0–40.0)

## 2018-10-19 LAB — LDL CHOLESTEROL, DIRECT: Direct LDL: 131 mg/dL

## 2018-10-19 LAB — TSH: TSH: 1.63 u[IU]/mL (ref 0.35–4.50)

## 2018-10-19 NOTE — Patient Instructions (Addendum)
Continue lifestyle intervention healthy eating and exercise .  bp goal is 120/80  But lifes tyle should help.   Will notify you  of labs when available.  Keep 6 mos [place] appt.   If bp  Is up and concnerning then plan fu with [place]   Otherwise  cpx in a year    Preventive Care 18-39 Years, Female Preventive care refers to lifestyle choices and visits with your health care provider that can promote health and wellness. What does preventive care include?  A yearly physical exam. This is also called an annual well check.  Dental exams once or twice a year.  Routine eye exams. Ask your health care provider how often you should have your eyes checked.  Personal lifestyle choices, including: ? Daily care of your teeth and gums. ? Regular physical activity. ? Eating a healthy diet. ? Avoiding tobacco and drug use. ? Limiting alcohol use. ? Practicing safe sex. ? Taking vitamin and mineral supplements as recommended by your health care provider. What happens during an annual well check? The services and screenings done by your health care provider during your annual well check will depend on your age, overall health, lifestyle risk factors, and family history of disease. Counseling Your health care provider may ask you questions about your:  Alcohol use.  Tobacco use.  Drug use.  Emotional well-being.  Home and relationship well-being.  Sexual activity.  Eating habits.  Work and work Statistician.  Method of birth control.  Menstrual cycle.  Pregnancy history.  Screening You may have the following tests or measurements:  Height, weight, and BMI.  Diabetes screening. This is done by checking your blood sugar (glucose) after you have not eaten for a while (fasting).  Blood pressure.  Lipid and cholesterol levels. These may be checked every 5 years starting at age 30.  Skin check.  Hepatitis C blood test.  Hepatitis B blood test.  Sexually transmitted disease  (STD) testing.  BRCA-related cancer screening. This may be done if you have a family history of breast, ovarian, tubal, or peritoneal cancers.  Pelvic exam and Pap test. This may be done every 3 years starting at age 86. Starting at age 15, this may be done every 5 years if you have a Pap test in combination with an HPV test.  Discuss your test results, treatment options, and if necessary, the need for more tests with your health care provider. Vaccines Your health care provider may recommend certain vaccines, such as:  Influenza vaccine. This is recommended every year.  Tetanus, diphtheria, and acellular pertussis (Tdap, Td) vaccine. You may need a Td booster every 10 years.  Varicella vaccine. You may need this if you have not been vaccinated.  HPV vaccine. If you are 87 or younger, you may need three doses over 6 months.  Measles, mumps, and rubella (MMR) vaccine. You may need at least one dose of MMR. You may also need a second dose.  Pneumococcal 13-valent conjugate (PCV13) vaccine. You may need this if you have certain conditions and were not previously vaccinated.  Pneumococcal polysaccharide (PPSV23) vaccine. You may need one or two doses if you smoke cigarettes or if you have certain conditions.  Meningococcal vaccine. One dose is recommended if you are age 14-21 years and a first-year college student living in a residence hall, or if you have one of several medical conditions. You may also need additional booster doses.  Hepatitis A vaccine. You may need this if you  have certain conditions or if you travel or work in places where you may be exposed to hepatitis A.  Hepatitis B vaccine. You may need this if you have certain conditions or if you travel or work in places where you may be exposed to hepatitis B.  Haemophilus influenzae type b (Hib) vaccine. You may need this if you have certain risk factors.  Talk to your health care provider about which screenings and vaccines  you need and how often you need them. This information is not intended to replace advice given to you by your health care provider. Make sure you discuss any questions you have with your health care provider. Document Released: 01/26/2002 Document Revised: 08/19/2016 Document Reviewed: 10/01/2015 Elsevier Interactive Patient Education  [employer].

## 2018-10-30 ENCOUNTER — Other Ambulatory Visit: Payer: Self-pay | Admitting: Internal Medicine

## 2018-10-31 NOTE — Telephone Encounter (Signed)
Last filled  12/31/17 citalopram (CELEXA) 10 MG tablet TAKE 1 TABLET (10 MG TOTAL) DAILY BY MOUTH., Starting Fri 12/31/2017, Normal  Dispense: 30 tablet  Refills: 5 ordered  Pharmacy: [id number] - [place], [place] - [address] [place] [address] [place] [address] (Ph: [phone])  Order Details Ordered on: 12/31/17  Authorizing provider: Burnis Medin, MD   Upcoming OV scheduled 10/23/2019

## 2018-12-04 ENCOUNTER — Encounter (HOSPITAL_COMMUNITY): Payer: Self-pay

## 2018-12-04 ENCOUNTER — Emergency Department (HOSPITAL_COMMUNITY)
Admission: EM | Admit: 2018-12-04 | Discharge: 2018-12-04 | Disposition: A | Discharge status: Home / Self Care | Payer: Managed Care, Other (non HMO) | Attending: Emergency Medicine | Admitting: Emergency Medicine

## 2018-12-04 ENCOUNTER — Emergency Department (HOSPITAL_COMMUNITY): Payer: Managed Care, Other (non HMO)

## 2018-12-04 DIAGNOSIS — W19XXXA Unspecified fall, initial encounter: Secondary | ICD-10-CM | POA: Insufficient documentation

## 2018-12-04 DIAGNOSIS — J45909 Unspecified asthma, uncomplicated: Secondary | ICD-10-CM | POA: Diagnosis not present

## 2018-12-04 DIAGNOSIS — Y999 Unspecified external cause status: Secondary | ICD-10-CM | POA: Insufficient documentation

## 2018-12-04 DIAGNOSIS — Z87891 Personal history of nicotine dependence: Secondary | ICD-10-CM | POA: Diagnosis not present

## 2018-12-04 DIAGNOSIS — Y929 Unspecified place or not applicable: Secondary | ICD-10-CM | POA: Insufficient documentation

## 2018-12-04 DIAGNOSIS — Y939 Activity, unspecified: Secondary | ICD-10-CM | POA: Diagnosis not present

## 2018-12-04 DIAGNOSIS — S8001XA Contusion of right knee, initial encounter: Secondary | ICD-10-CM | POA: Diagnosis not present

## 2018-12-04 DIAGNOSIS — S8991XA Unspecified injury of right lower leg, initial encounter: Secondary | ICD-10-CM | POA: Diagnosis present

## 2018-12-04 MED ORDER — HYDROCODONE-ACETAMINOPHEN 5-325 MG PO TABS
1.0000 | ORAL_TABLET | Freq: Once | ORAL | Status: AC
  Administered 2018-12-04: 1 via ORAL
  Filled 2018-12-04: qty 1

## 2018-12-04 MED ORDER — IBUPROFEN 600 MG PO TABS: 600.0000 mg | ORAL_TABLET | Freq: Four times a day (QID) | ORAL | 0 refills | Status: DC | PRN

## 2018-12-04 NOTE — ED Notes (Signed)
Patient transported to X-ray 

## 2018-12-04 NOTE — ED Notes (Signed)
Pt. returned from XR. 

## 2018-12-04 NOTE — ED Triage Notes (Signed)
Pt reports tripping yesterday and fell onto her right knee, pain is now radiating to her hip. Pt has brace on knee

## 2018-12-04 NOTE — ED Provider Notes (Signed)
[facility] EMERGENCY DEPARTMENT Provider Note   CSN: [id number] Arrival date & time: 12/04/18  1711     History   Chief Complaint Chief Complaint  Patient presents with  . Fall  . Knee Pain    HPI Tina Mckee is a 27 y.o. female with hx of right hemiparesis who presents to the ED with knee pain. The pain started yesterday after patient fell on her right knee. She took tylenol and rested the area. Today she continues to have pain and has not taken any medication for pain today. Patient reports due to the weakness on the right side she has falls. She has seen Dr. Lorin Mercy in the past and told that she will eventually need surgery on the right leg. Patient states she just needs to make sure she did not break anything.   HPI  Past Medical History:  Diagnosis Date  . Asthma   . Gestational hypertension   . Perinatal arterial ischemic stroke ([place])   . Scoliosis   . Stroke [facility]) birth   rt hemiparesis  Perinatal  . Supervision of other normal pregnancy 05/04/2014    Patient Active Problem List   Diagnosis Date Noted  . Hemiparesis affecting right side as late effect of cerebrovascular accident ([place]) 02/06/2016  . Pigmented skin lesion of uncertain nature 08/67/6195  . Hyperlipidemia 02/20/2015  . Visit for preventive health examination 02/20/2015  . Hx of fall 02/20/2015  . Encounter for IUD insertion 02/12/2015  . Cystic 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, antepartum 11/14/2014  . Rubella non-immune status, antepartum 07/31/2014  . History of stroke 05/04/2014  . Obesity (BMI 30-39.9) 09/29/2013  . Thrombocytosis ([place]) 10/01/2012  . High blood triglycerides 09/27/2012  . Abnormal LFTs 09/27/2012  . Weight gain 09/27/2012  . Shaking 04/22/2012  . Swelling 04/22/2012  . Hyperglycemia 09/23/2011  . Abnormal glucose 09/21/2011  . Falling episodes 09/21/2011  . Cervical radicular pain 08/03/2011  . Wrist pain 02/16/2011  . Hemiparesis, right ([place]) 02/16/2011  .  SCOLIOSIS 01/16/2009    Past Surgical History:  Procedure Laterality Date  . FOOT SURGERY     rt foot  . FOOT SURGERY     Bone placed  . moles removed     from back     OB History    Gravida  2   Para  2   Term  2   Preterm  0   AB      Living  2     SAB      TAB      Ectopic      Multiple  0   Live Births  2            Home Medications    Prior to Admission medications   Medication Sig Start Date End Date Taking? Authorizing Provider  albuterol (PROVENTIL HFA;VENTOLIN HFA) 108 (90 Base) MCG/ACT inhaler Inhale 1-2 puffs into the lungs every 6 (six) hours as needed for wheezing or shortness of breath. 12/15/17   Nche, Charlene Brooke, NP  ibuprofen (ADVIL,MOTRIN) 600 MG tablet Take 1 tablet (600 mg total) by mouth every 6 (six) hours as needed. 12/04/18   Ashley Murrain, NP  Multiple Vitamins-Calcium (ONE-A-DAY WOMENS PO) Take 1 tablet by mouth daily.    [provider]  PARAGARD INTRAUTERINE COPPER IU by Intrauterine route.    [provider]    Family History Family History  Problem Relation Age of Onset  . Hypertension Mother   . Asthma  Mother   . Hypertension Father   . Asthma Sister   . Cancer Maternal Grandfather        pancreatic  . Cancer Paternal Grandmother        cancer all over  . Other Paternal Grandfather        brain damage  . Other Maternal Grandmother        heart problems    Social History Social History   Tobacco Use  . Smoking status: Former Smoker    Types: Cigarettes    Last attempt to quit: 07/11/2007    Years since quitting: 11.4  . Smokeless tobacco: Never Used  Substance Use Topics  . Alcohol use: Yes    Alcohol/week: 1.0 standard drinks    Types: 1 Standard drinks or equivalent per week    Comment: Every other day  . Drug use: No     Allergies   Cephalexin   Review of Systems Review of Systems  Musculoskeletal: Positive for arthralgias.       Right knee pain  All other systems  reviewed and are negative.    Physical Exam Updated Vital Signs BP 134/84 (BP Location: Right Arm)   Pulse 84   Temp 97.9 F (36.6 C) (Oral)   Resp 18   SpO2 100%   Physical Exam Vitals signs and nursing note reviewed.  Constitutional:      Appearance: She is well-developed.  HENT:     Head: Normocephalic.  Eyes:     Conjunctiva/sclera: Conjunctivae normal.  Neck:     Musculoskeletal: Normal range of motion and neck supple.  Cardiovascular:     Rate and Rhythm: Normal rate.  Pulmonary:     Effort: Pulmonary effort is normal.  Abdominal:     Palpations: Abdomen is soft.  Musculoskeletal:     Right knee: She exhibits swelling and abnormal alignment. She exhibits no deformity, no laceration and no erythema. Decreased range of motion: due to pain. Tenderness found.       Legs:     Comments: Right side weakness that is not new. Pedal pulse 2+.  Skin:    General: Skin is warm and dry.  Neurological:     Mental Status: She is alert and oriented to person, place, and time.  Psychiatric:        Mood and Affect: Mood normal.      ED Treatments / Results  Labs (all labs ordered are listed, but only abnormal results are displayed) Labs Reviewed - No data to display Radiology Dg Knee Complete 4 Views Right  Result Date: 12/04/2018 CLINICAL DATA:  Fall EXAM: RIGHT KNEE - COMPLETE 4+ VIEW COMPARISON:  None. FINDINGS: No evidence of fracture, dislocation, or joint effusion. No evidence of arthropathy or other focal bone abnormality. Soft tissues are unremarkable. IMPRESSION: Negative. Electronically Signed   By: Donavan Foil M.D.   On: 12/04/2018 18:15    Procedures Procedures (including critical care time)  Medications Ordered in ED Medications  HYDROcodone-acetaminophen (NORCO/VICODIN) 5-325 MG per tablet 1 tablet (1 tablet Oral Given 12/04/18 1733)     Initial Impression / Assessment and Plan / ED Course  I have reviewed the triage vital signs and the nursing  notes. 27 y.o. female here with right knee injury stable for d/c without fracture or dislocation. Patient has knee brace and will f/u with Dr. Lorin Mercy. She will return for any problems.   Final Clinical Impressions(s) / ED Diagnoses   Final diagnoses:  Contusion of right knee,  initial encounter  Fall, initial encounter    ED Discharge Orders         Ordered    ibuprofen (ADVIL,MOTRIN) 600 MG tablet  Every 6 hours PRN     12/04/18 1923           Debroah Baller [place], [place] 12/04/18 1929    Orlie Dakin, MD 12/04/18 [phone]

## 2019-01-11 ENCOUNTER — Telehealth: Payer: Managed Care, Other (non HMO) | Admitting: Family

## 2019-01-11 ENCOUNTER — Ambulatory Visit (INDEPENDENT_AMBULATORY_CARE_PROVIDER_SITE_OTHER): Payer: Managed Care, Other (non HMO) | Admitting: Internal Medicine

## 2019-01-11 ENCOUNTER — Encounter: Payer: Self-pay | Admitting: Internal Medicine

## 2019-01-11 VITALS — BP 120/62 | HR 85 | Temp 98.2°F | Wt 236.2 lb

## 2019-01-11 DIAGNOSIS — R51 Headache: Secondary | ICD-10-CM

## 2019-01-11 DIAGNOSIS — R519 Headache, unspecified: Secondary | ICD-10-CM

## 2019-01-11 DIAGNOSIS — R112 Nausea with vomiting, unspecified: Secondary | ICD-10-CM

## 2019-01-11 DIAGNOSIS — R079 Chest pain, unspecified: Secondary | ICD-10-CM | POA: Diagnosis not present

## 2019-01-11 LAB — CBC WITH DIFFERENTIAL/PLATELET
Basophils Absolute: 0.1 10*3/uL (ref 0.0–0.1)
Basophils Relative: 0.6 % (ref 0.0–3.0)
Eosinophils Absolute: 0.3 10*3/uL (ref 0.0–0.7)
Eosinophils Relative: 2.3 % (ref 0.0–5.0)
HCT: 43.7 % (ref 36.0–46.0)
Hemoglobin: 15.1 g/dL — ABNORMAL HIGH (ref 12.0–15.0)
Lymphocytes Relative: 26.7 % (ref 12.0–46.0)
Lymphs Abs: 3.4 10*3/uL (ref 0.7–4.0)
MCHC: 34.7 g/dL (ref 30.0–36.0)
MCV: 85.6 fl (ref 78.0–100.0)
Monocytes Absolute: 0.6 10*3/uL (ref 0.1–1.0)
Monocytes Relative: 5 % (ref 3.0–12.0)
NEUTROS PCT: 65.4 % (ref 43.0–77.0)
Neutro Abs: 8.3 10*3/uL — ABNORMAL HIGH (ref 1.4–7.7)
Platelets: 455 10*3/uL — ABNORMAL HIGH (ref 150.0–400.0)
RBC: 5.1 Mil/uL (ref 3.87–5.11)
RDW: 12.6 % (ref 11.5–15.5)
WBC: 12.8 10*3/uL — ABNORMAL HIGH (ref 4.0–10.5)

## 2019-01-11 LAB — POC URINALSYSI DIPSTICK (AUTOMATED)
Bilirubin, UA: NEGATIVE
Glucose, UA: NEGATIVE
Ketones, UA: NEGATIVE
NITRITE UA: NEGATIVE
Protein, UA: POSITIVE — AB
Spec Grav, UA: >=1.03 — AB (ref 1.010–1.025)
Urobilinogen, UA: 0.2 E.U./dL
pH, UA: 5 (ref 5.0–8.0)

## 2019-01-11 LAB — HEPATIC FUNCTION PANEL
ALT: 15 U/L (ref 0–35)
AST: 13 U/L (ref 0–37)
Albumin: 4.5 g/dL (ref 3.5–5.2)
Alkaline Phosphatase: 71 U/L (ref 39–117)
BILIRUBIN DIRECT: 0.2 mg/dL (ref 0.0–0.3)
Total Bilirubin: 1.2 mg/dL (ref 0.2–1.2)
Total Protein: 6.9 g/dL (ref 6.0–8.3)

## 2019-01-11 LAB — BASIC METABOLIC PANEL
BUN: 12 mg/dL (ref 6–23)
CALCIUM: 10.5 mg/dL (ref 8.4–10.5)
CO2: 27 mEq/L (ref 19–32)
Chloride: 103 mEq/L (ref 96–112)
Creatinine, Ser: 0.6 mg/dL (ref 0.40–1.20)
GFR: 117.58 mL/min (ref >60.00)
GLUCOSE: 84 mg/dL (ref 70–99)
Potassium: 4.1 mEq/L (ref 3.5–5.1)
Sodium: 140 mEq/L (ref 135–145)

## 2019-01-11 LAB — POCT URINE PREGNANCY: Preg Test, Ur: NEGATIVE

## 2019-01-11 MED ORDER — ONDANSETRON HCL 4 MG/2ML IJ SOLN
4.0000 mg | Freq: Three times a day (TID) | INTRAMUSCULAR | Status: DC | PRN
  Administered 2019-01-11: 4 mg via INTRAMUSCULAR

## 2019-01-11 MED ORDER — ONDANSETRON 4 MG PO TBDP: 4.0000 mg | ORAL_TABLET | Freq: Three times a day (TID) | ORAL | 1 refills | Status: DC | PRN

## 2019-01-11 NOTE — Telephone Encounter (Signed)
Scheduled with Dr Regis Bill today at 245 per Dr Velora Mediate request Nothing further needed.

## 2019-01-11 NOTE — Progress Notes (Signed)
Chief Complaint  Patient presents with  . Emesis    Pt is having nausea and vomtiing since yesterday mornong at 5  pt states it feels like heart burn. Pt has been throwing up" stomach acid" and not foid pt has chest pain in middle. pt took pepto bismo and tums but had no relief     HPI: Tina Mckee 28 y.o. come in for acute visit   See above and  havind heaving and tight like heart burn and now feeling right upper pain and mid chest .  Diarrhea  Loose   No blood .  Felt feverish but   Not doumented  Onset was yesterday morning acute now she has some great fatigue feeling and decreased urination and she is tired of vomiting.  No shortness of breath but midline chest pain with acid feeling.  Denies risk of pregnancy no UTI symptoms. Continue vomiting   After water. Ginger ale.  Today her father is helping with the children her husband is working out of town.  She has had something similar before but got better.  The children have not been sick. She has not had her gallbladder taken out. ROS: See pertinent positives and negatives per HPI.  Had knee injury over the weekend and is in the knee brace but is getting better and to see orthopedics she is not taking an anti-inflammatory   Past Medical History:  Diagnosis Date  . Asthma   . Gestational hypertension   . Perinatal arterial ischemic stroke ([place])   . Scoliosis   . Stroke [facility]) birth   rt hemiparesis  Perinatal  . Supervision of other normal pregnancy 05/04/2014    Family History  Problem Relation Age of Onset  . Hypertension Mother   . Asthma Mother   . Hypertension Father   . Asthma Sister   . Cancer Maternal Grandfather        pancreatic  . Cancer Paternal Grandmother        cancer all over  . Other Paternal Grandfather        brain damage  . Other Maternal Grandmother        heart problems    Social History   Socioeconomic History  . Marital status: Married    Spouse name: Not on file  . Number of children:  2  . Years of education: Not on file  . Highest education level: Not on file  Occupational History  . Occupation: Therapist, art  Social Needs  . Financial resource strain: Not on file  . Food insecurity:    Worry: Not on file    Inability: Not on file  . Transportation needs:    Medical: Not on file    Non-medical: Not on file  Tobacco Use  . Smoking status: Former Smoker    Types: Cigarettes    Last attempt to quit: 07/11/2007    Years since quitting: 11.5  . Smokeless tobacco: Never Used  Substance and Sexual Activity  . Alcohol use: Yes    Alcohol/week: 1.0 standard drinks    Types: 1 Standard drinks or equivalent per week    Comment: Every other day  . Drug use: No  . Sexual activity: Yes    Birth control/protection: I.U.D.  Lifestyle  . Physical activity:    Days per week: Not on file    Minutes per session: Not on file  . Stress: Not on file  Relationships  . Social connections:    Talks on  phone: Not on file    Gets together: Not on file    Attends religious service: Not on file    Active member of club or organization: Not on file    Attends meetings of clubs or organizations: Not on file    Relationship status: Not on file  Other Topics Concern  . 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     Currently nonsmoking    HH of 4 no pets     had mild preeclampsia with vaginal childbirth.  2011   cb 12 15 preeclampsi no complicatinos   ocass etoh.    Exercise  Walking     Sleep ok    caffiene limited per day .              Outpatient Medications Prior to Visit  Medication Sig Dispense Refill  . albuterol (PROVENTIL HFA;VENTOLIN HFA) 108 (90 Base) MCG/ACT inhaler Inhale 1-2 puffs into the lungs every 6 (six) hours as needed for wheezing or shortness of breath. 1 Inhaler 0  . ibuprofen (ADVIL,MOTRIN) 600 MG tablet Take 1 tablet (600 mg total) by mouth every 6 (six)  hours as needed. 30 tablet 0  . Multiple Vitamins-Calcium (ONE-A-DAY WOMENS PO) Take 1 tablet by mouth daily.    Marland Kitchen PARAGARD INTRAUTERINE COPPER IU by Intrauterine route.     No facility-administered medications prior to visit.      EXAM:  BP 120/62 (BP Location: Right Arm, Patient Position: Sitting, Cuff Size: Large)   Pulse 85   Temp 98.2 F (36.8 C) (Oral)   Wt 236 lb 3.2 oz (107.1 kg)   LMP 11/29/2018   SpO2 99%   BMI 43.20 kg/m   Body mass index is 43.2 kg/m. Moderately ill-appearing in no acute distress nontoxic normal cognition HEENT atraumatic moist mucous membranes neck supple chest CTA BS equal cardiac S1-S2 no gallops or murmurs abdomen bowel sounds are present throughout she is tender in the epigastrium and a little bit right upper quadrant but no masses are noted she has discomfort when laying down flat reporting acid feeling in her mid chest esophagus. Skin normal capillary refill no acute rashes. Neurologic no change from baseline right lower extremity with a knee brace on    Lab Results  Component Value Date   WBC 12.8 (H) 01/11/2019   HGB 15.1 (H) 01/11/2019   HCT 43.7 01/11/2019   PLT 455.0 (H) 01/11/2019   GLUCOSE 84 01/11/2019   CHOL 179 10/19/2018   TRIG 209.0 (H) 10/19/2018   HDL 33.70 (L) 10/19/2018   LDLDIRECT 131.0 10/19/2018   LDLCALC 115 (H) 10/23/2016   ALT 15 01/11/2019   AST 13 01/11/2019   NA 140 01/11/2019   K 4.1 01/11/2019   CL 103 01/11/2019   CREATININE 0.60 01/11/2019   BUN 12 01/11/2019   CO2 27 01/11/2019   TSH 1.63 10/19/2018   HGBA1C 5.4 10/15/2017   MICROALBUR 0.9 09/20/2012   BP Readings from Last 3 Encounters:  01/11/19 120/62  12/04/18 (!) 135/94  10/19/18 130/90    ASSESSMENT AND PLAN:  Discussed the following assessment and plan:  Nausea and vomiting, intractability of vomiting not specified, unspecified vomiting type - Plan: Basic metabolic panel, CBC with Differential/Platelet, Hepatic function panel, POCT  urine pregnancy, POCT Urinalysis Dipstick (Automated), ondansetron (ZOFRAN) injection 4 mg, Urine Culture  Chest pain, unspecified  type - Felt related to vomiting. - Plan: Basic metabolic panel, CBC with Differential/Platelet, Hepatic function panel, POCT urine pregnancy, POCT Urinalysis Dipstick (Automated) Acute nausea vomiting mild diarrhea x1 supposed acute gastritis although has been fairly severe of the last 48 hours.  IM Zofran given in office prescription sent in rehydration discussed Gatorade etc. if signs of dehydration or not coming down seek care in the emergency room. Laboratory studies today rule out other underlying etiologies. If recurrent and progressive consider biliary evaluation.   Expectant management.  -Patient advised to return or notify health care team  if  new concerns arise.  Patient Instructions  This could be a stomach virus that will resolve but need to try to get hydrated in the interim .  Med for  vomiting  And then small amounts  1-2 oz every 30 minutes and advance  But no solids until vomiting subsiding .   Chest pain is probably from vo,iting and acid   Can get OTC pepcid 10 - 20 mg twice a day . To help.   If if severe pain dehydration then  May need to go to ED for hydrating.   If recurrent problem consider checking for gall bladder disease .  Standley Brooking. Romone Shaff M.D. UA shows pyuria will send for culture.

## 2019-01-11 NOTE — Telephone Encounter (Signed)
Scheduled with Dr Regis Bill today at 245 Nothing further needed.

## 2019-01-11 NOTE — Telephone Encounter (Signed)
Pt states that she came down with something yesterday morning - nausea and vomiting  Pt having some chest discomfort/pain -- thinking this is from where she has been heaving and throwing up for the last 24 hours.  Pt is wondering if this is from an old ulcer, states this feels like bad heartburn.  Denies shortness of breath  Feels feverish and hot to the touch, pt does not have a thermometer to test her temp at this time.  Feels very fatigued - has not slept much.  Pt has tried [employer] and Tums and nothing seems to help her symptoms.

## 2019-01-11 NOTE — Progress Notes (Signed)
Based on what you shared with me it looks like you have a serious condition that should be evaluated in a face to face office visit.  NOTE: If you entered your credit card information for this eVisit, you will not be charged. You may see a "hold" on your card for the $30 but that hold will drop off and you will not have a charge processed.  *Given your symptoms of nausea, vomiting, headache, and chest pain, I feel it is best you be evaluated face to face to rule out other causes. I hope you feel better soon.   If you are having a true medical emergency please call 911.  If you need an urgent face to face visit, [facility] has four urgent care centers for your convenience.  If you need care fast and have a high deductible or no insurance consider:   [email or web address] to reserve your spot online an avoid wait times  [facility] [address] [place], [place] [address] 8 am to 8 pm Monday-Friday 10 am to 4 pm Saturday-Sunday *Across the street from [employer]  [place], [address] 8 am to 5 pm Monday-Friday * In the [facility] on the [facility]   The following sites will take your  insurance:  . [facility] [place] a Provider at this Location  [address] [place], [place] [address] . 10 am to 8 pm Monday-Friday . 12 pm to 8 pm Saturday-Sunday   . [facility] [place] a Provider at this Location  [address], [place], [place] [address] . 8 am to 8 pm Monday-Friday . 9 am to 6 pm Saturday . 11 am to 6 pm Sunday   . [facility] at [place] Get Driving Directions  [address] [place], [place] [address] . 8 am to 8 pm Monday-Friday . 8 am to 4 pm Saturday-Sunday   Your e-visit answers were  reviewed by a board certified advanced clinical practitioner to complete your personal care plan.  Thank you for using e-Visits.

## 2019-01-11 NOTE — Patient Instructions (Addendum)
This could be a stomach virus that will resolve but need to try to get hydrated in the interim .  Med for  vomiting  And then small amounts  1-2 oz every 30 minutes and advance  But no solids until vomiting subsiding .   Chest pain is probably from vo,iting and acid   Can get OTC pepcid 10 - 20 mg twice a day . To help.   If if severe pain dehydration then  May need to go to ED for hydrating.   If recurrent problem consider checking for gall bladder disease .

## 2019-01-12 LAB — URINE CULTURE
MICRO NUMBER:: 122560
SPECIMEN QUALITY: ADEQUATE

## 2019-02-18 ENCOUNTER — Other Ambulatory Visit: Payer: Self-pay | Admitting: Nurse Practitioner

## 2019-02-18 ENCOUNTER — Other Ambulatory Visit: Payer: Self-pay | Admitting: Internal Medicine

## 2019-02-18 DIAGNOSIS — J101 Influenza due to other identified influenza virus with other respiratory manifestations: Secondary | ICD-10-CM

## 2019-03-17 ENCOUNTER — Other Ambulatory Visit: Payer: Self-pay | Admitting: Internal Medicine

## 2019-03-17 DIAGNOSIS — J101 Influenza due to other identified influenza virus with other respiratory manifestations: Secondary | ICD-10-CM

## 2019-04-06 ENCOUNTER — Other Ambulatory Visit: Payer: Self-pay | Admitting: Internal Medicine

## 2019-04-06 DIAGNOSIS — J101 Influenza due to other identified influenza virus with other respiratory manifestations: Secondary | ICD-10-CM

## 2019-04-18 ENCOUNTER — Other Ambulatory Visit: Payer: Managed Care, Other (non HMO)

## 2019-04-20 ENCOUNTER — Ambulatory Visit
Admission: RE | Admit: 2019-04-20 | Discharge: 2019-04-20 | Disposition: A | Discharge status: Home / Self Care | Payer: Managed Care, Other (non HMO) | Source: Ambulatory Visit | Attending: Internal Medicine | Admitting: Internal Medicine

## 2019-04-20 ENCOUNTER — Other Ambulatory Visit: Payer: Self-pay

## 2019-04-20 DIAGNOSIS — R200 Anesthesia of skin: Secondary | ICD-10-CM

## 2019-04-20 DIAGNOSIS — N6002 Solitary cyst of left breast: Secondary | ICD-10-CM

## 2019-04-20 DIAGNOSIS — N644 Mastodynia: Secondary | ICD-10-CM

## 2019-04-20 DIAGNOSIS — M79602 Pain in left arm: Secondary | ICD-10-CM

## 2019-04-20 DIAGNOSIS — Z803 Family history of malignant neoplasm of breast: Secondary | ICD-10-CM

## 2019-06-30 IMAGING — DX DG CHEST 2V
2 series · 2 of 2 positions shown · non-contrast
Comparison: 10/07/2017

CLINICAL DATA: Left breast chest pain, intermittent left arm
numbness

EXAM:
CHEST - 2 VIEW

[chest pa]
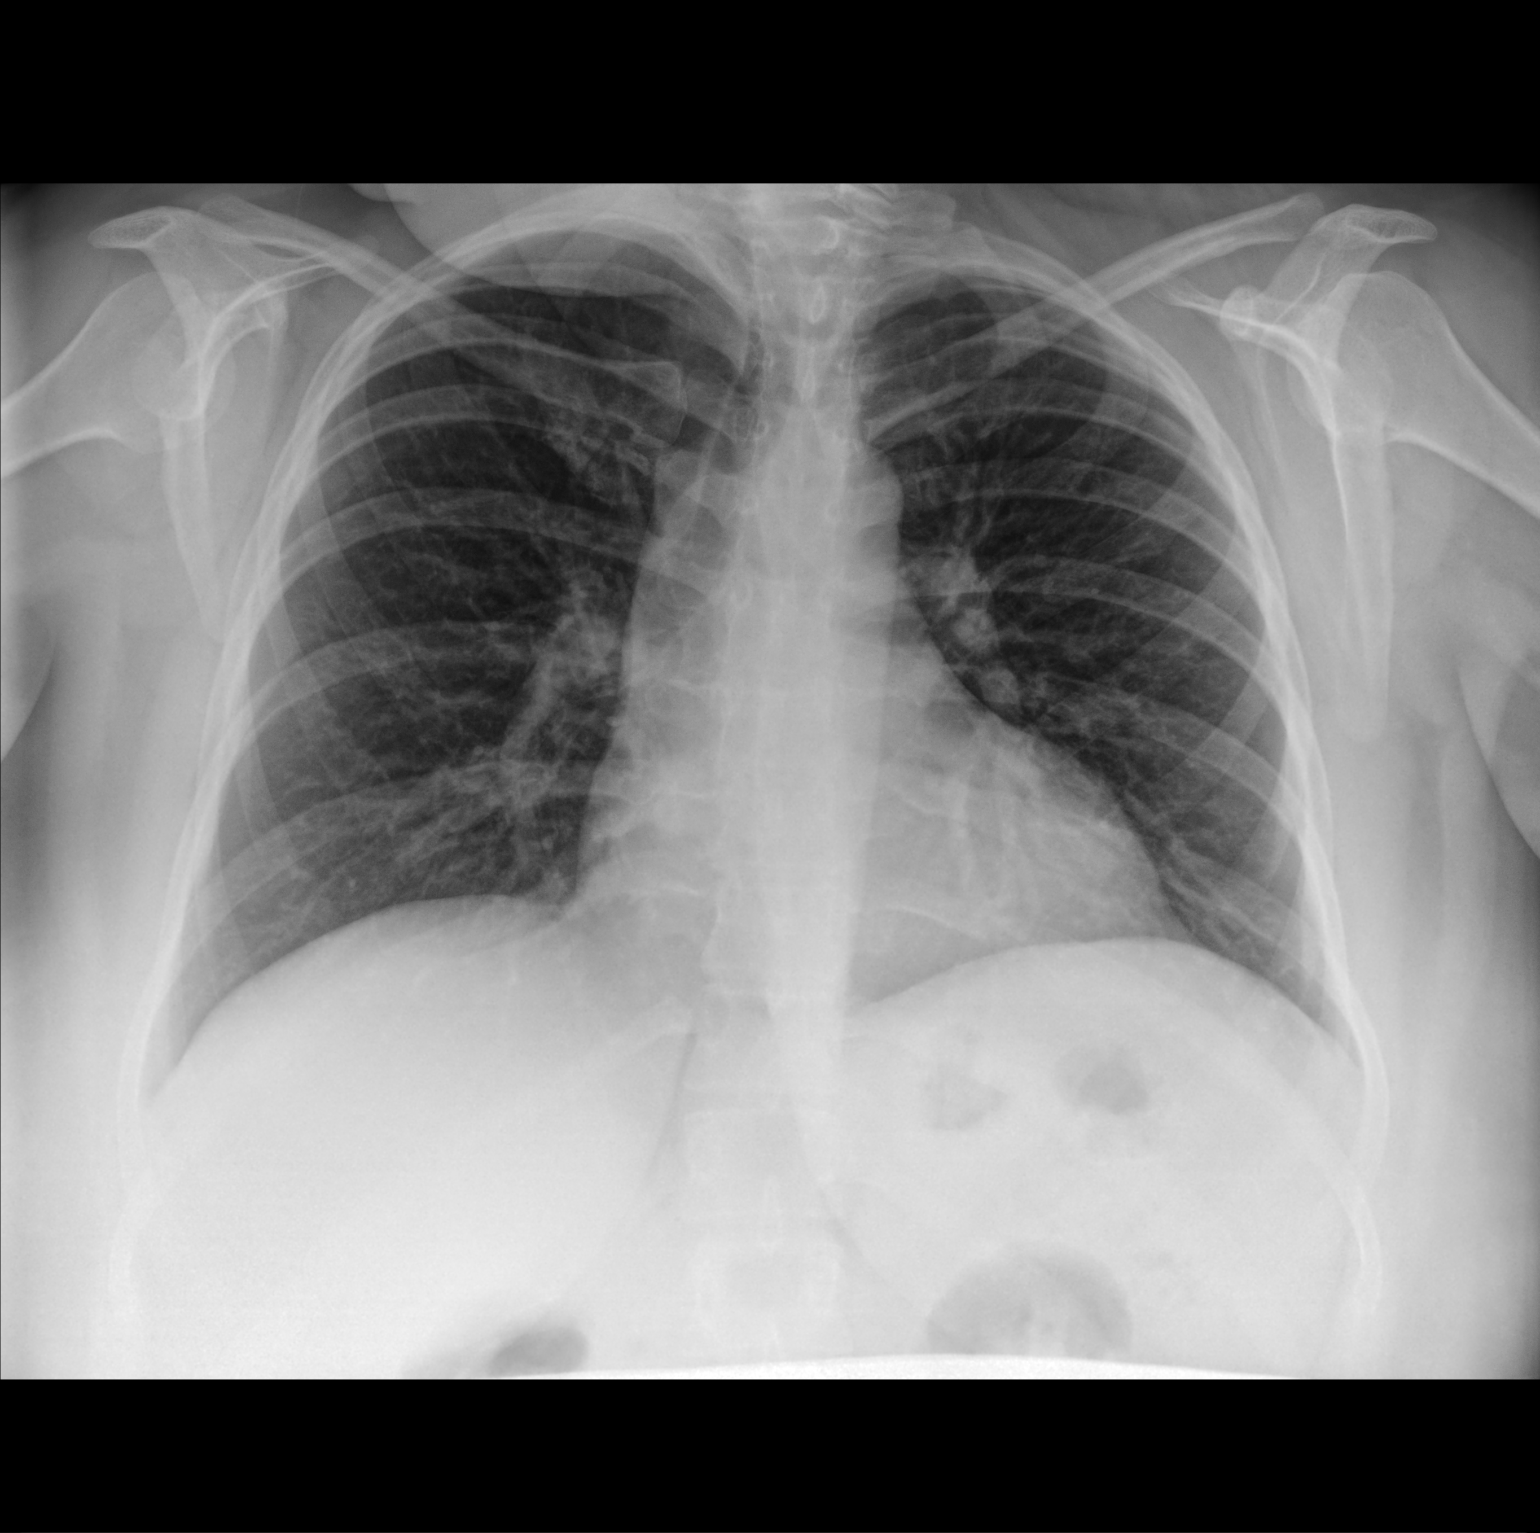

[chest lat]
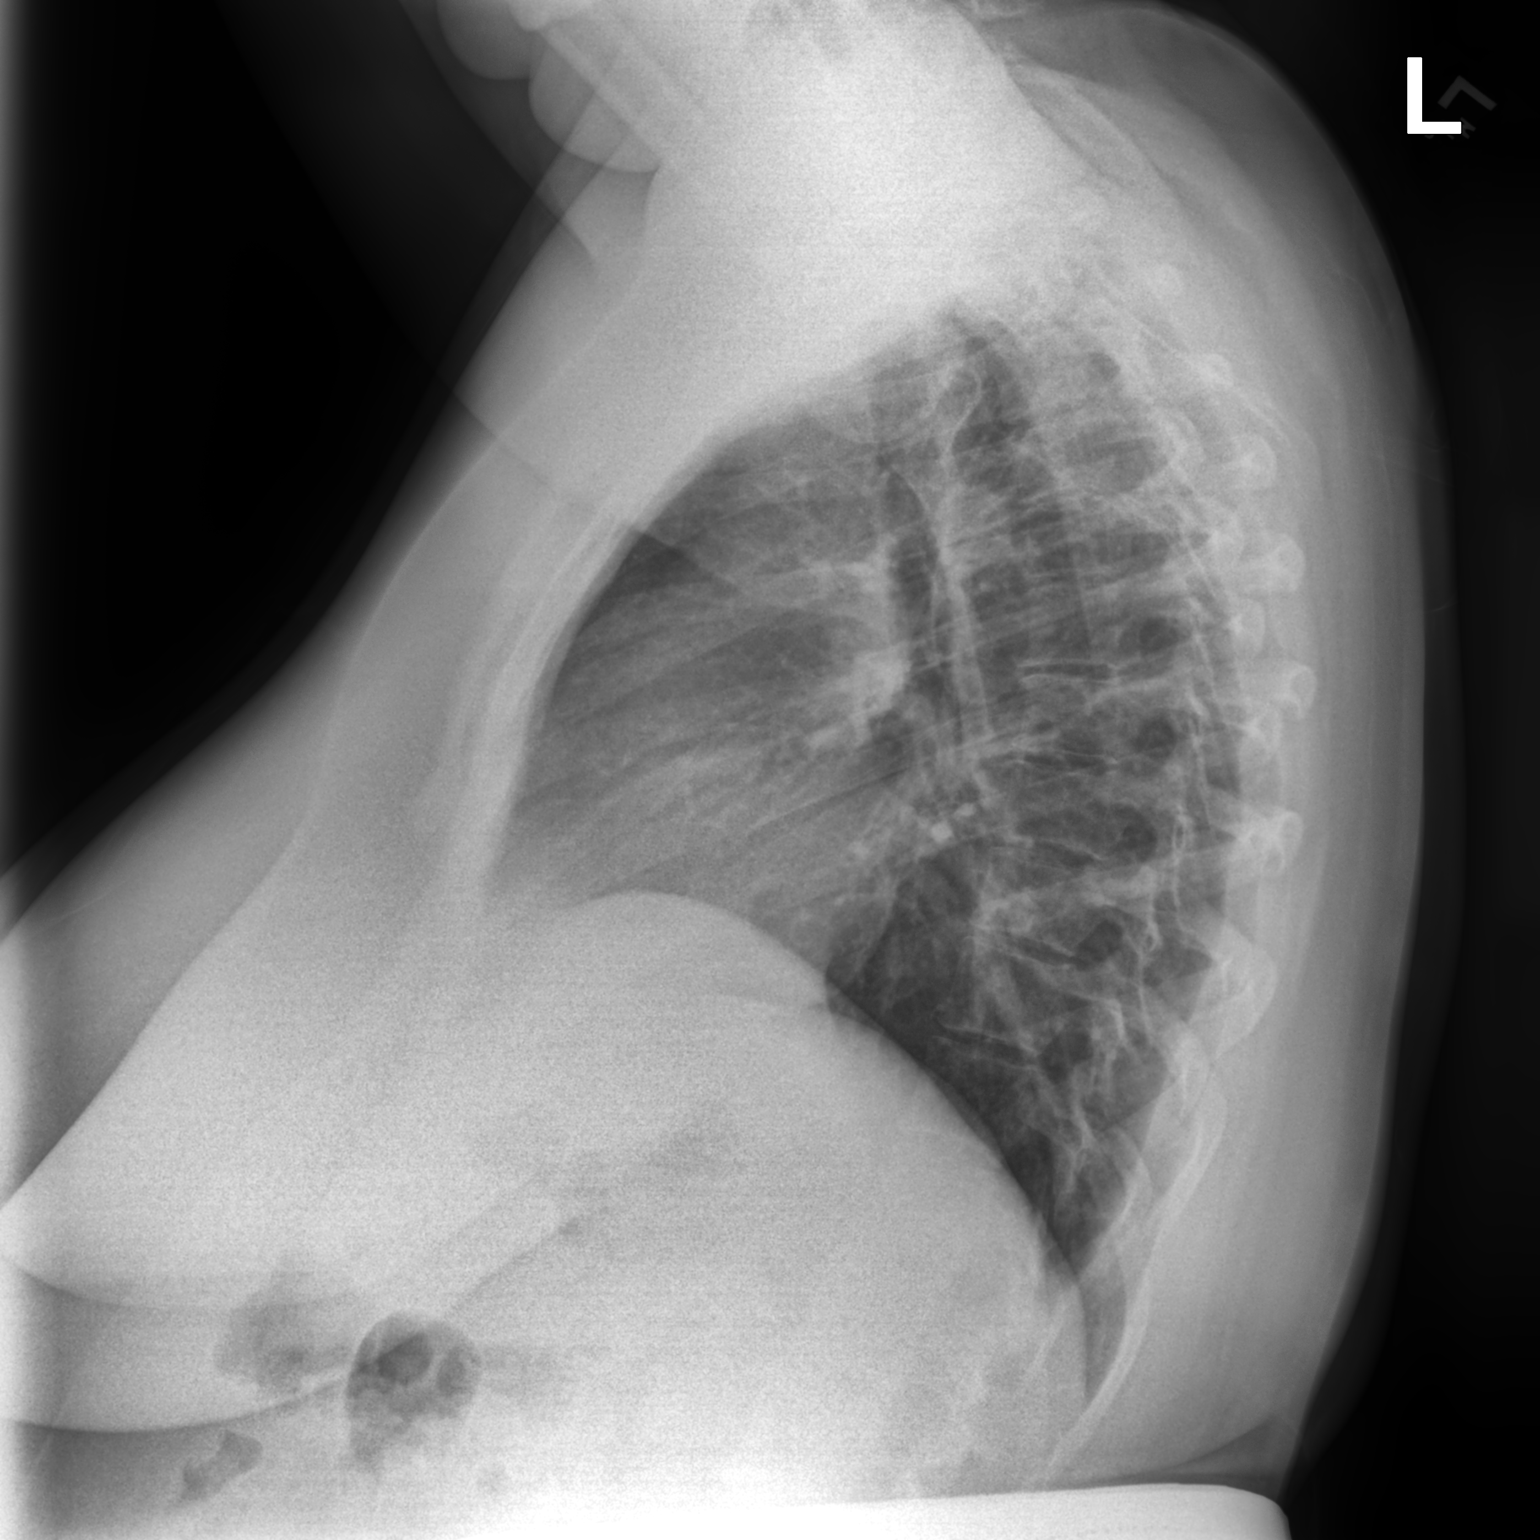

[2 of 2 positions shown; findings below may reference images not displayed]

FINDINGS: The heart size and mediastinal contours are within normal limits.
Both lungs are clear. The visualized skeletal structures are
unremarkable.
IMPRESSION: No active cardiopulmonary disease.

## 2019-09-06 NOTE — Progress Notes (Signed)
Virtual Visit via Video Note  I connected with@ on 09/07/19 at  9:00 AM EDT by a video enabled telemedicine application and verified that I am speaking with the correct person using two identifiers. Location patient: home Location provider:home office Persons participating in the virtual visit: patient, provider  WIth national recommendations  regarding COVID 19 pandemic   video visit is advised over in office visit for this patient.  Patient aware  of the limitations of evaluation and management by telemedicine and  availability of in person appointments. and agreed to proceed.   HPI: Tina Mckee presents for video visit  See  Messag.  Has had 1 week of sever nausea  Without  Vomiting and post prandial nausea right after eating that last about 2 hours  .  She has ongoing intermittent lower avd cramping that predates this for a while and thinks could be iud   Has had a recenet menses.   And iud string in place .  No uti sx  To see her gyne in November .    no etoh  One coke per day . Had been using excepdrin for HAs but not  Seeming ly related  To this  ROS: See pertinent positives and negatives per HPI. No constipation or diarrhea . Vomiting fever chills   No one sick at home  Keeping her kids home from school . Husband now at home  Was furloughed from his job.    Past Medical History:  Diagnosis Date  . Asthma   . Gestational hypertension   . Perinatal arterial ischemic stroke ([place])   . Scoliosis   . Stroke [facility]) birth   rt hemiparesis  Perinatal  . Supervision of other normal pregnancy 05/04/2014    Past Surgical History:  Procedure Laterality Date  . FOOT SURGERY     rt foot  . FOOT SURGERY     Bone placed  . moles removed     from back    Family History  Problem Relation Age of Onset  . Hypertension Mother   . Asthma Mother   . Hypertension Father   . Asthma Sister   . Cancer Maternal Grandfather        pancreatic  . Cancer Paternal Grandmother    cancer all over  . Other Paternal Grandfather        brain damage  . Other Maternal Grandmother        heart problems    Social History   Tobacco Use  . Smoking status: Former Smoker    Types: Cigarettes    Quit date: 07/11/2007    Years since quitting: 12.1  . Smokeless tobacco: Never Used  Substance Use Topics  . Alcohol use: Yes    Alcohol/week: 1.0 standard drinks    Types: 1 Standard drinks or equivalent per week    Comment: Every other day  . Drug use: No      Current Outpatient Medications:  .  ibuprofen (ADVIL,MOTRIN) 600 MG tablet, Take 1 tablet (600 mg total) by mouth every 6 (six) hours as needed., Disp: 30 tablet, Rfl: 0 .  Multiple Vitamins-Calcium (ONE-A-DAY WOMENS PO), Take 1 tablet by mouth daily., Disp: , Rfl:  .  omeprazole (PRILOSEC) 20 MG capsule, Take 1 capsule (20 mg total) by mouth daily., Disp: 30 capsule, Rfl: 0 .  ondansetron (ZOFRAN ODT) 4 MG disintegrating tablet, Take 1 tablet (4 mg total) by mouth every 8 (eight) hours as needed for nausea or vomiting., Disp: 20  tablet, Rfl: 1 .  PARAGARD INTRAUTERINE COPPER IU, by Intrauterine route., Disp: , Rfl:  .  PROAIR HFA 108 (90 Base) MCG/ACT inhaler, INHALE 1-2 PUFFS INTO THE LUNGS EVERY 6 (SIX) HOURS AS NEEDED FOR WHEEZING OR SHORTNESS OF BREATH., Disp: 8.5 Inhaler, Rfl: 0  Current Facility-Administered Medications:  .  ondansetron (ZOFRAN) injection 4 mg, 4 mg, Intramuscular, Q8H PRN, Kasmira Cacioppo, Standley Brooking, MD, 4 mg at 01/11/19 1530  EXAM: BP Readings from Last 3 Encounters:  01/11/19 120/62  12/04/18 (!) 135/94  10/19/18 130/90    VITALS per patient if applicable:  GENERAL: alert, oriented, appears well and in no acute distress  HEENT: atraumatic, conjunttiva clear, no obvious abnormalities on inspection of external nose and ears  NECK: normal movements of the head and neck  LUNGS: on inspection no signs of respiratory distress, breathing rate appears normal, no obvious gross SOB, gasping or  wheezing  CV: no obvious cyanosis  PSYCH/NEURO: pleasant and cooperative, som discomfort .  speech and thought processing grossly intact   ASSESSMENT AND PLAN:  Discussed the following assessment and plan:    ICD-10-CM   1. Postprandial nausea  R11.0    see text   2. Abdominal cramping, bilateral lower quadrant recurrent   R10.31    R10.32    on going  pre dates above  3. IUD (intrauterine device) in place  Z97.5    Disc differential diag  We decided that the lower abd sx has predated   Current sx for  Quite a while  Not sure if related to her IUD and advise she disc with her GYNE.   However if different pain progressing needs more eval   At this time add zofran and ppi  Eat light and  Fu viist or my chart in about 2 weeks  Disc alarm sx for emergent eva. Not typical of  Appendicitis or diverticulitis  Etc.  Consider  Biliary disease and [place] if  persistent or progressive  Her periods on time  And iud so do not  suspect preg  Limit excedrin as possible Counseled.   Expectant management and discussion of plan and treatment with opportunity to ask questions and all were answered. The patient agreed with the plan and demonstrated an understanding of the instructions.   Advised to call back or seek an in-person evaluation if worsening  or having  further concerns . In interim   Disc precautions taken in our office.   Shanon Ace, MD

## 2019-09-07 ENCOUNTER — Other Ambulatory Visit: Payer: Self-pay

## 2019-09-07 ENCOUNTER — Telehealth (INDEPENDENT_AMBULATORY_CARE_PROVIDER_SITE_OTHER): Payer: Managed Care, Other (non HMO) | Admitting: Internal Medicine

## 2019-09-07 ENCOUNTER — Encounter: Payer: Self-pay | Admitting: Internal Medicine

## 2019-09-07 DIAGNOSIS — R1031 Right lower quadrant pain: Secondary | ICD-10-CM | POA: Diagnosis not present

## 2019-09-07 DIAGNOSIS — R110 Nausea: Secondary | ICD-10-CM | POA: Diagnosis not present

## 2019-09-07 DIAGNOSIS — R1032 Left lower quadrant pain: Secondary | ICD-10-CM

## 2019-09-07 DIAGNOSIS — Z975 Presence of (intrauterine) contraceptive device: Secondary | ICD-10-CM | POA: Diagnosis not present

## 2019-09-07 MED ORDER — ONDANSETRON 4 MG PO TBDP: 4.0000 mg | ORAL_TABLET | Freq: Three times a day (TID) | ORAL | 1 refills | Status: DC | PRN

## 2019-09-07 MED ORDER — OMEPRAZOLE 20 MG PO CPDR: 20.0000 mg | DELAYED_RELEASE_CAPSULE | Freq: Every day | ORAL | 0 refills | Status: DC

## 2019-09-12 ENCOUNTER — Emergency Department (HOSPITAL_COMMUNITY)
Admission: EM | Admit: 2019-09-12 | Discharge: 2019-09-13 | Disposition: A | Discharge status: Home / Self Care | Payer: Managed Care, Other (non HMO) | Attending: Emergency Medicine | Admitting: Emergency Medicine

## 2019-09-12 ENCOUNTER — Other Ambulatory Visit: Payer: Self-pay

## 2019-09-12 ENCOUNTER — Encounter (HOSPITAL_COMMUNITY): Payer: Self-pay

## 2019-09-12 DIAGNOSIS — Z87891 Personal history of nicotine dependence: Secondary | ICD-10-CM | POA: Diagnosis not present

## 2019-09-12 DIAGNOSIS — Z79899 Other long term (current) drug therapy: Secondary | ICD-10-CM | POA: Insufficient documentation

## 2019-09-12 DIAGNOSIS — N760 Acute vaginitis: Secondary | ICD-10-CM | POA: Diagnosis not present

## 2019-09-12 DIAGNOSIS — R102 Pelvic and perineal pain: Secondary | ICD-10-CM | POA: Insufficient documentation

## 2019-09-12 DIAGNOSIS — B9689 Other specified bacterial agents as the cause of diseases classified elsewhere: Secondary | ICD-10-CM | POA: Insufficient documentation

## 2019-09-12 DIAGNOSIS — I69351 Hemiplegia and hemiparesis following cerebral infarction affecting right dominant side: Secondary | ICD-10-CM | POA: Insufficient documentation

## 2019-09-12 LAB — COMPREHENSIVE METABOLIC PANEL
ALT: 29 U/L (ref 0–44)
AST: 22 U/L (ref 15–41)
Albumin: 4.5 g/dL (ref 3.5–5.0)
Alkaline Phosphatase: 75 U/L (ref 38–126)
Anion gap: 14 (ref 5–15)
BUN: 10 mg/dL (ref 6–20)
CO2: 22 mmol/L (ref 22–32)
Calcium: 9.5 mg/dL (ref 8.9–10.3)
Chloride: 102 mmol/L (ref 98–111)
Creatinine, Ser: 0.61 mg/dL (ref 0.44–1.00)
GFR calc Af Amer: >60 mL/min (ref >60)
GFR calc non Af Amer: >60 mL/min (ref >60)
Glucose, Bld: 92 mg/dL (ref 70–99)
Potassium: 3.6 mmol/L (ref 3.5–5.1)
Sodium: 138 mmol/L (ref 135–145)
Total Bilirubin: 1.4 mg/dL — ABNORMAL HIGH (ref 0.3–1.2)
Total Protein: 7.5 g/dL (ref 6.5–8.1)

## 2019-09-12 LAB — URINALYSIS, ROUTINE W REFLEX MICROSCOPIC
Bilirubin Urine: NEGATIVE
Glucose, UA: NEGATIVE mg/dL
Hgb urine dipstick: NEGATIVE
Ketones, ur: NEGATIVE mg/dL
Nitrite: NEGATIVE
Protein, ur: NEGATIVE mg/dL
Specific Gravity, Urine: 1.019 (ref 1.005–1.030)
pH: 5 (ref 5.0–8.0)

## 2019-09-12 LAB — CBC
HCT: 48.2 % — ABNORMAL HIGH (ref 36.0–46.0)
Hemoglobin: 16 g/dL — ABNORMAL HIGH (ref 12.0–15.0)
MCH: 29.5 pg (ref 26.0–34.0)
MCHC: 33.2 g/dL (ref 30.0–36.0)
MCV: 88.8 fL (ref 80.0–100.0)
Platelets: 483 10*3/uL — ABNORMAL HIGH (ref 150–400)
RBC: 5.43 MIL/uL — ABNORMAL HIGH (ref 3.87–5.11)
RDW: 12.3 % (ref 11.5–15.5)
WBC: 13.2 10*3/uL — ABNORMAL HIGH (ref 4.0–10.5)
nRBC: 0 % (ref 0.0–0.2)

## 2019-09-12 LAB — I-STAT BETA HCG BLOOD, ED (MC, WL, AP ONLY): I-stat hCG, quantitative: <5 m[IU]/mL (ref <5)

## 2019-09-12 LAB — LIPASE, BLOOD: Lipase: 25 U/L (ref 11–51)

## 2019-09-12 MED ORDER — SODIUM CHLORIDE 0.9% FLUSH: 3.0000 mL | Freq: Once | INTRAVENOUS | Status: DC

## 2019-09-12 MED ORDER — OXYCODONE-ACETAMINOPHEN 5-325 MG PO TABS
1.0000 | ORAL_TABLET | ORAL | Status: DC | PRN
  Administered 2019-09-12: 1 via ORAL
  Filled 2019-09-12: qty 1

## 2019-09-12 NOTE — ED Triage Notes (Signed)
Pt reports generalized abd pain for the past month but has gotten worse this week. Had an e visit with her PCP who prescribed omeprazole and zofran without relief. Nausea but no vomiting.

## 2019-09-13 ENCOUNTER — Emergency Department (HOSPITAL_COMMUNITY): Payer: Managed Care, Other (non HMO)

## 2019-09-13 LAB — WET PREP, GENITAL
Sperm: NONE SEEN
Trich, Wet Prep: NONE SEEN
Yeast Wet Prep HPF POC: NONE SEEN

## 2019-09-13 MED ORDER — IBUPROFEN 800 MG PO TABS: 800.0000 mg | ORAL_TABLET | Freq: Three times a day (TID) | ORAL | 0 refills | Status: DC

## 2019-09-13 MED ORDER — IBUPROFEN 800 MG PO TABS: 800.0000 mg | ORAL_TABLET | Freq: Once | ORAL | Status: DC

## 2019-09-13 MED ORDER — OXYCODONE-ACETAMINOPHEN 5-325 MG PO TABS
1.0000 | ORAL_TABLET | Freq: Once | ORAL | Status: AC
  Administered 2019-09-13: 1 via ORAL
  Filled 2019-09-13: qty 1

## 2019-09-13 MED ORDER — METRONIDAZOLE 500 MG PO TABS: 500.0000 mg | ORAL_TABLET | Freq: Two times a day (BID) | ORAL | 0 refills | Status: DC

## 2019-09-13 NOTE — Discharge Instructions (Signed)
Take the prescribed medication as directed. Follow-up with your primary care doctor.  I would also follow-up with your OB-GYN. Return to the ED for new or worsening symptoms.

## 2019-09-13 NOTE — ED Notes (Signed)
Patient transported to Ultrasound 

## 2019-09-13 NOTE — ED Provider Notes (Signed)
[facility] EMERGENCY DEPARTMENT Provider Note   CSN: [id number] Arrival date & time: 09/12/19  1946     History   Chief Complaint Chief Complaint  Patient presents with   Abdominal Pain    HPI Tina Mckee is a 28 y.o. female.     The history is provided by the patient and medical records.  Abdominal Pain    28 y.o. F with hx of asthma, scoliosis, perinatal stroke with some residual right sided deficits, presenting to the ED for lower abdominal pain.  Patient states she has been having intermittent abdominal pain for about a month now.  Patient reports this has moved around sometimes but now she has pain across her lower abdomen in the pelvis.  She states it feels sharp, stabby, crampy all at the same time.  States she has not had any irregular vaginal discharge or urinary symptoms.  States her menstrual cycle usually comes every other month and that is unchanged.  No new sexual partner.  She does have copper IUD in and think there may be something wrong with it.  This is her first IUD, has been in place for almost 5 years.  Past Medical History:  Diagnosis Date   Asthma    Gestational hypertension    Perinatal arterial ischemic stroke ([place])    Scoliosis    Stroke [facility]) birth   rt hemiparesis  Perinatal   Supervision of other normal pregnancy 05/04/2014    Patient Active Problem List   Diagnosis Date Noted   Hemiparesis affecting right side as late effect of cerebrovascular accident ([place]) 02/06/2016   Pigmented skin lesion of uncertain nature [id number]   Hyperlipidemia 02/20/2015   Visit for preventive health examination 02/20/2015   Hx of fall 02/20/2015   Encounter for IUD insertion 02/12/2015   Cystic 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, antepartum 11/14/2014   Rubella non-immune status, antepartum 07/31/2014   History of stroke 05/04/2014   Obesity (BMI 30-39.9) 09/29/2013   Thrombocytosis ([place]) 10/01/2012   High blood triglycerides  09/27/2012   Abnormal LFTs 09/27/2012   Weight gain 09/27/2012   Shaking 04/22/2012   Swelling 04/22/2012   Hyperglycemia 09/23/2011   Abnormal glucose 09/21/2011   Falling episodes 09/21/2011   Cervical radicular pain 08/03/2011   Wrist pain 02/16/2011   Hemiparesis, right ([place]) 02/16/2011   SCOLIOSIS 01/16/2009    Past Surgical History:  Procedure Laterality Date   FOOT SURGERY     rt foot   FOOT SURGERY     Bone placed   moles removed     from back     OB History    Gravida  2   Para  2   Term  2   Preterm  0   AB      Living  2     SAB      TAB      Ectopic      Multiple  0   Live Births  2            Home Medications    Prior to Admission medications   Medication Sig Start Date End Date Taking? Authorizing Provider  ibuprofen (ADVIL,MOTRIN) 600 MG tablet Take 1 tablet (600 mg total) by mouth every 6 (six) hours as needed. 12/04/18   Ashley Murrain, NP  Multiple Vitamins-Calcium (ONE-A-DAY WOMENS PO) Take 1 tablet by mouth daily.    [provider]  omeprazole (PRILOSEC) 20 MG capsule Take 1 capsule (20 mg total) by mouth  daily. 09/07/19   Panosh, Standley Brooking, MD  ondansetron (ZOFRAN ODT) 4 MG disintegrating tablet Take 1 tablet (4 mg total) by mouth every 8 (eight) hours as needed for nausea or vomiting. 09/07/19   Panosh, Standley Brooking, MD  PARAGARD INTRAUTERINE COPPER IU by Intrauterine route.    [provider]  PROAIR HFA 108 [id number] Base) MCG/ACT inhaler INHALE 1-2 PUFFS INTO THE LUNGS EVERY 6 (SIX) HOURS AS NEEDED FOR WHEEZING OR SHORTNESS OF BREATH. 04/06/19   Panosh, Standley Brooking, MD    Family History Family History  Problem Relation Age of Onset   Hypertension Mother    Asthma Mother    Hypertension Father    Asthma Sister    Cancer Maternal Grandfather        pancreatic   Cancer Paternal Grandmother        cancer all over   Other Paternal Grandfather        brain damage   Other Maternal Grandmother         heart problems    Social History Social History   Tobacco Use   Smoking status: Former Smoker    Types: Cigarettes    Quit date: 07/11/2007    Years since quitting: 12.1   Smokeless tobacco: Never Used  Substance Use Topics   Alcohol use: Yes    Alcohol/week: 1.0 standard drinks    Types: 1 Standard drinks or equivalent per week    Comment: Every other day   Drug use: No     Allergies   Cephalexin   Review of Systems Review of Systems  Gastrointestinal: Positive for abdominal pain.  Genitourinary: Positive for pelvic pain.  All other systems reviewed and are negative.    Physical Exam Updated Vital Signs BP (!) 139/99 (BP Location: Left Arm)    Pulse 76    Temp 98.2 F (36.8 C) (Oral)    Resp 16    Ht 5\' 2"  (1.575 m)    Wt 108 kg    LMP 08/12/2019    SpO2 100%    BMI 43.53 kg/m   Physical Exam Vitals signs and nursing note reviewed.  Constitutional:      Appearance: She is well-developed.  HENT:     Head: Normocephalic and atraumatic.  Eyes:     Conjunctiva/sclera: Conjunctivae normal.     Pupils: Pupils are equal, round, and reactive to light.  Neck:     Musculoskeletal: Normal range of motion.  Cardiovascular:     Rate and Rhythm: Normal rate and regular rhythm.     Heart sounds: Normal heart sounds.  Pulmonary:     Effort: Pulmonary effort is normal.     Breath sounds: Normal breath sounds.  Abdominal:     General: Bowel sounds are normal.     Palpations: Abdomen is soft.     Tenderness: There is abdominal tenderness.       Comments: Tenderness across lower abdomen at level of the pelvis  Genitourinary:    Comments: Exam chaperoned by RN Normal female external genitalia without visible lesions or rash, moderate amount of thick, white vaginal discharge, IUD strings visible, no CMT, left adnexal tenderness Musculoskeletal: Normal range of motion.  Skin:    General: Skin is warm and dry.  Neurological:     Mental Status: She is alert and  oriented to person, place, and time.      ED Treatments / Results  Labs (all labs ordered are listed, but only abnormal results are displayed)  Labs Reviewed  WET PREP, GENITAL - Abnormal; Notable for the following components:      Result Value   Clue Cells Wet Prep HPF POC PRESENT (*)    WBC, Wet Prep HPF POC MANY (*)    All other components within normal limits  COMPREHENSIVE METABOLIC PANEL - Abnormal; Notable for the following components:   Total Bilirubin 1.4 (*)    All other components within normal limits  CBC - Abnormal; Notable for the following components:   WBC 13.2 (*)    RBC 5.43 (*)    Hemoglobin 16.0 (*)    HCT 48.2 (*)    Platelets 483 (*)    All other components within normal limits  URINALYSIS, ROUTINE W REFLEX MICROSCOPIC - Abnormal; Notable for the following components:   APPearance HAZY (*)    Leukocytes,Ua MODERATE (*)    Bacteria, UA RARE (*)    All other components within normal limits  LIPASE, BLOOD  I-STAT BETA HCG BLOOD, ED ([facility], [facility], [facility] ONLY)  GC/CHLAMYDIA PROBE AMP ([place]) NOT AT [facility]    EKG None  Radiology US Pelvic Doppler (torsion R/o Or Mass Arterial Flow)  Result Date: 09/13/2019 CLINICAL DATA:  Female pelvic pain for 1 month EXAM: TRANSABDOMINAL AND TRANSVAGINAL ULTRASOUND OF PELVIS DOPPLER ULTRASOUND OF OVARIES TECHNIQUE: Both transabdominal and transvaginal ultrasound examinations of the pelvis were performed. Transabdominal technique was performed for global imaging of the pelvis including uterus, ovaries, adnexal regions, and pelvic cul-de-sac. It was necessary to proceed with endovaginal exam following the transabdominal exam to visualize the ovaries. Color and duplex Doppler ultrasound was utilized to evaluate blood flow to the ovaries. COMPARISON:  None. FINDINGS: Uterus Measurements: 8.5 x 4 x 5.5 cm = volume: 95 mL. No fibroids or other mass visualized. Endometrium Thickness: 12 mm.  IUD in expected position Right ovary  Measurements: 28 x 33 x 18 mm = volume: 9 mL. Normal appearance/no adnexal mass. Left ovary Measurements: 39 x 24 x 25 mm = volume: 12 mL. Normal appearance/no adnexal mass. Pulsed Doppler evaluation of both ovaries demonstrates normal low-resistance arterial and venous waveforms. Other findings No abnormal free fluid. IMPRESSION: Normal pelvic ultrasound. Electronically Signed   By: Monte Fantasia M.D.   On: 09/13/2019 06:13   US Pelvic Complete With Transvaginal  Result Date: 09/13/2019 CLINICAL DATA:  Female pelvic pain for 1 month EXAM: TRANSABDOMINAL AND TRANSVAGINAL ULTRASOUND OF PELVIS DOPPLER ULTRASOUND OF OVARIES TECHNIQUE: Both transabdominal and transvaginal ultrasound examinations of the pelvis were performed. Transabdominal technique was performed for global imaging of the pelvis including uterus, ovaries, adnexal regions, and pelvic cul-de-sac. It was necessary to proceed with endovaginal exam following the transabdominal exam to visualize the ovaries. Color and duplex Doppler ultrasound was utilized to evaluate blood flow to the ovaries. COMPARISON:  None. FINDINGS: Uterus Measurements: 8.5 x 4 x 5.5 cm = volume: 95 mL. No fibroids or other mass visualized. Endometrium Thickness: 12 mm.  IUD in expected position Right ovary Measurements: 28 x 33 x 18 mm = volume: 9 mL. Normal appearance/no adnexal mass. Left ovary Measurements: 39 x 24 x 25 mm = volume: 12 mL. Normal appearance/no adnexal mass. Pulsed Doppler evaluation of both ovaries demonstrates normal low-resistance arterial and venous waveforms. Other findings No abnormal free fluid. IMPRESSION: Normal pelvic ultrasound. Electronically Signed   By: Monte Fantasia M.D.   On: 09/13/2019 06:13    Procedures Procedures (including critical care time)  Medications Ordered in ED Medications  sodium chloride flush (NS)  0.9 % injection 3 mL (has no administration in time range)  oxyCODONE-acetaminophen (PERCOCET/ROXICET) 5-325 MG per tablet  1 tablet (1 tablet Oral Given 09/12/19 2035)  ibuprofen (ADVIL) tablet 800 mg (has no administration in time range)  oxyCODONE-acetaminophen (PERCOCET/ROXICET) 5-325 MG per tablet 1 tablet (1 tablet Oral Given 09/13/19 [id number])     Initial Impression / Assessment and Plan / ED Course  I have reviewed the triage vital signs and the nursing notes.  Pertinent labs & imaging results that were available during my care of the patient were reviewed by me and considered in my medical decision making (see chart for details).  28 year old female presenting to the ED with intermittent lower abdominal pain for the past month.  States pain is been moving around but lately has been localized to the pelvis.  She is afebrile and nontoxic.  She does have some tenderness along the margin of the pelvis.  Labs overall reassuring.  UA with rare bacteria and currently denies any urinary symptoms.  Pelvic exam was performed with moderate amount of thick, white vaginal discharge.  Some left adnexal tenderness but no cervical motion tenderness.  Wet prep does reveal clue cells. Gc/chl pending.  Ultrasound was obtained and is negative.  After talking with patient again, it sounds like she has been having a lot of pain and discomfort that she thinks is from her IUD.  I recommended that she follow-up with her OB/GYN about this.  Can also follow-up with PCP.  Will treat with course of Flagyl for BV.  She does not have any new sexual partner expressed concern for STD so I feel PID is unlikely.  She can return here for any new or acute changes.  Final Clinical Impressions(s) / ED Diagnoses   Final diagnoses:  Pain in female pelvis  Bacterial vaginosis    ED Discharge Orders         Ordered    metroNIDAZOLE (FLAGYL) 500 MG tablet  2 times daily     09/13/19 0621    ibuprofen (ADVIL) 800 MG tablet  3 times daily     09/13/19 0621           Larene Pickett, PA-C 09/13/19 0628    Ward, Delice Bison, DO 09/13/19 [phone]

## 2019-09-14 LAB — GC/CHLAMYDIA PROBE AMP (~~LOC~~) NOT AT ARMC
Chlamydia: NEGATIVE
Molecular Disclaimer: NEGATIVE
Molecular Disclaimer: NORMAL
Neisseria Gonorrhea: NEGATIVE

## 2019-09-29 ENCOUNTER — Other Ambulatory Visit: Payer: Self-pay | Admitting: Internal Medicine

## 2019-10-06 MED ORDER — CITALOPRAM HYDROBROMIDE 10 MG PO TABS: 10.0000 mg | ORAL_TABLET | Freq: Every day | ORAL | 2 refills | Status: DC

## 2019-10-06 NOTE — Telephone Encounter (Signed)
Will send in refill citalopram 10 mg  And please plan fu in 1-2 months and if needed we can do a  Virtual visit    Sent to [facility]

## 2019-10-22 ENCOUNTER — Other Ambulatory Visit: Payer: Self-pay | Admitting: Internal Medicine

## 2019-10-23 ENCOUNTER — Encounter: Payer: Managed Care, Other (non HMO) | Admitting: Internal Medicine

## 2019-10-28 ENCOUNTER — Other Ambulatory Visit: Payer: Self-pay | Admitting: Internal Medicine

## 2019-11-22 ENCOUNTER — Encounter: Payer: Managed Care, Other (non HMO) | Admitting: Internal Medicine

## 2019-12-05 NOTE — Progress Notes (Signed)
Virtual Visit via Video Note  I connected with@ on 12 23 20  at  9:30 AM EST by a video enabled telemedicine application and verified that I am speaking with the correct person using two identifiers. Location patient: home Location provider: home office Persons participating in the virtual visit: patient, provider  [place] national recommendations  regarding COVID 19 pandemic   video visit is advised over in office visit for this patient.  Patient aware  of the limitations of evaluation and management by telemedicine and  availability of in person appointments. and agreed to proceed.   HPI: Tina Mckee presents for video visit  Since she has been n 10 of citalopram her husband and kids have noted improvement in her mood and energy level  She used to lay in bed in the day.  No reported se of import.  Also she is recovering from covid 19 inf  residual cough  Got from father who works in a non masked  Job situation  And go sick after t giving   She had pos test dec  2 an since then has improved  Her husband had fever for a day and pos test.  Kids never got sick.  So everyone is recovering at this time.   ROS: See pertinent positives and negatives per HPI. No fever sob   Past Medical History:  Diagnosis Date  . Asthma   . Gestational hypertension   . Perinatal arterial ischemic stroke ([place])   . Scoliosis   . Stroke [facility]) birth   rt hemiparesis  Perinatal  . Supervision of other normal pregnancy 05/04/2014    Past Surgical History:  Procedure Laterality Date  . FOOT SURGERY     rt foot  . FOOT SURGERY     Bone placed  . moles removed     from back    Family History  Problem Relation Age of Onset  . Hypertension Mother   . Asthma Mother   . Hypertension Father   . Asthma Sister   . Cancer Maternal Grandfather        pancreatic  . Cancer Paternal Grandmother        cancer all over  . Other Paternal Grandfather        brain damage  . Other Maternal Grandmother    heart problems       Current Outpatient Medications:  .  citalopram (CELEXA) 10 MG tablet, TAKE 1 TABLET BY MOUTH EVERY DAY, Disp: 90 tablet, Rfl: 1 .  ibuprofen (ADVIL) 800 MG tablet, Take 1 tablet (800 mg total) by mouth 3 (three) times daily., Disp: 21 tablet, Rfl: 0 .  metroNIDAZOLE (FLAGYL) 500 MG tablet, Take 1 tablet (500 mg total) by mouth 2 (two) times daily., Disp: 14 tablet, Rfl: 0 .  Multiple Vitamins-Calcium (ONE-A-DAY WOMENS PO), Take 1 tablet by mouth daily., Disp: , Rfl:  .  omeprazole (PRILOSEC) 20 MG capsule, TAKE 1 CAPSULE BY MOUTH EVERY DAY, Disp: 30 capsule, Rfl: 2 .  ondansetron (ZOFRAN ODT) 4 MG disintegrating tablet, Take 1 tablet (4 mg total) by mouth every 8 (eight) hours as needed for nausea or vomiting., Disp: 20 tablet, Rfl: 1 .  PARAGARD INTRAUTERINE COPPER IU, 1 application by Intrauterine route once. , Disp: , Rfl:  .  PROAIR HFA 108 (90 Base) MCG/ACT inhaler, INHALE 1-2 PUFFS INTO THE LUNGS EVERY 6 (SIX) HOURS AS NEEDED FOR WHEEZING OR SHORTNESS OF BREATH. (Patient taking differently: Inhale 1-2 puffs into the lungs every 6 (six)  hours as needed for wheezing or shortness of breath. ), Disp: 8.5 Inhaler, Rfl: 0  Current Facility-Administered Medications:  .  ondansetron (ZOFRAN) injection 4 mg, 4 mg, Intramuscular, Q8H PRN, Valinda Fedie, Standley Brooking, MD, 4 mg at 01/11/19 1530  EXAM: BP Readings from Last 3 Encounters:  09/13/19 (!) 139/99  01/11/19 120/62  12/04/18 (!) 135/94    VITALS per patient if applicable:  GENERAL: alert, oriented, appears well and in no acute distress  HEENT: atraumatic, conjunttiva clear, no obvious abnormalities on inspection of external nose and ears  NECK: normal movements of the head and neck  LUNGS: on inspection no signs of respiratory distress, breathing rate appears normal, no obvious gross SOB, gasping or wheezing  CV: no obvious cyanosis  PSYCH/NEURO: pleasant and cooperative, no obvious depression or anxiety, speech and  thought processing grossly intact   ASSESSMENT AND PLAN:  Discussed the following assessment and plan:    ICD-10-CM   1. Medication management  Z79.899   2. History of 2019 novel coronavirus disease (COVID-19)  Z86.19   3. Adjustment disorder with depressed mood improved on citalopram  F43.21    She will have her yearly next month and reassess any other problems  Goo response to low dose citalopram and to continue  Pharmacy request to be filled .  Benefit more than risk of medications  to continue. Counseled.  Glad she is doing ok past covid and medication   Expectant management and discussion of plan and treatment with opportunity to ask questions and all were answered. The patient agreed with the plan and demonstrated an understanding of the instructions.   Advised to call back or seek an in-person evaluation if worsening  or having  further concerns . Return for when planned  in january .   Shanon Ace, MD

## 2019-12-06 ENCOUNTER — Encounter: Payer: Self-pay | Admitting: Internal Medicine

## 2019-12-06 ENCOUNTER — Telehealth (INDEPENDENT_AMBULATORY_CARE_PROVIDER_SITE_OTHER): Payer: Managed Care, Other (non HMO) | Admitting: Internal Medicine

## 2019-12-06 ENCOUNTER — Other Ambulatory Visit: Payer: Self-pay

## 2019-12-06 DIAGNOSIS — Z8619 Personal history of other infectious and parasitic diseases: Secondary | ICD-10-CM

## 2019-12-06 DIAGNOSIS — Z79899 Other long term (current) drug therapy: Secondary | ICD-10-CM | POA: Diagnosis not present

## 2019-12-06 DIAGNOSIS — F4321 Adjustment disorder with depressed mood: Secondary | ICD-10-CM | POA: Diagnosis not present

## 2019-12-06 DIAGNOSIS — Z8616 Personal history of COVID-19: Secondary | ICD-10-CM

## 2019-12-25 ENCOUNTER — Encounter: Payer: Self-pay | Admitting: Internal Medicine

## 2019-12-25 ENCOUNTER — Ambulatory Visit (INDEPENDENT_AMBULATORY_CARE_PROVIDER_SITE_OTHER): Payer: Managed Care, Other (non HMO) | Admitting: Internal Medicine

## 2019-12-25 ENCOUNTER — Other Ambulatory Visit: Payer: Self-pay

## 2019-12-25 VITALS — BP 122/74 | HR 96 | Temp 98.1°F | Ht 62.0 in | Wt 254.0 lb

## 2019-12-25 DIAGNOSIS — Z8616 Personal history of COVID-19: Secondary | ICD-10-CM

## 2019-12-25 DIAGNOSIS — Z79899 Other long term (current) drug therapy: Secondary | ICD-10-CM

## 2019-12-25 DIAGNOSIS — E785 Hyperlipidemia, unspecified: Secondary | ICD-10-CM

## 2019-12-25 DIAGNOSIS — D582 Other hemoglobinopathies: Secondary | ICD-10-CM

## 2019-12-25 DIAGNOSIS — Z0000 Encounter for general adult medical examination without abnormal findings: Secondary | ICD-10-CM | POA: Diagnosis not present

## 2019-12-25 DIAGNOSIS — E669 Obesity, unspecified: Secondary | ICD-10-CM

## 2019-12-25 DIAGNOSIS — R635 Abnormal weight gain: Secondary | ICD-10-CM

## 2019-12-25 DIAGNOSIS — I69351 Hemiplegia and hemiparesis following cerebral infarction affecting right dominant side: Secondary | ICD-10-CM

## 2019-12-25 DIAGNOSIS — R0683 Snoring: Secondary | ICD-10-CM

## 2019-12-25 DIAGNOSIS — Z820 Family history of epilepsy and other diseases of the nervous system: Secondary | ICD-10-CM

## 2019-12-25 NOTE — Progress Notes (Signed)
Chief Complaint  Patient presents with  . Annual Exam    HPI: Patient  Tina Mckee  29 y.o. comes in today for [place] visit  And fu meds .    Father had covid and then she and her husband got covid.  Nov 29th e  Cough and then  Taste smell and no fever. .   A lot better     Snores sometimes  Sleep interruped at times  'feels gained weigiht over holidays from holiday eating    Father  Has hx osa  On cpap  noipted to have elevated hg  At hosp  prilosec  As needed . 3 days  Not taking daily.  Has helped nausea   Anxiety depress much better on citalopram Health Maintenance  Topic Date Due  . PAP SMEAR-Modifier  10/14/2019  . TETANUS/TDAP  11/14/2024  . INFLUENZA VACCINE  Completed  . HIV Screening  Completed   Health Maintenance Review LIFESTYLE:  Exercise:  Not reg yet bu has machine no falling for a year Tobacco/ETS:  no Alcohol:  Rare  Sugar beverages:  gain weight over x mas.  Sleep:  Not as good.   Drug use: no HH of    4  2 children 5 and 10 daughters   Dog  Husband now  In [place] .   father    Has osa .  Uncertain if mom has this ? Wheezing at night.   ROS:  GEN/ HEENT: No fever, significant weight changes sweats headaches vision problems hearing changes, CV/ PULM; No chest pain shortness of breath cough, syncope,edema  change in exercise tolerance. GI /GU: No adominal pain, vomiting, change in bowel habits. No blood in the stool. No significant GU symptoms. SKIN/HEME: ,no acute skin rashes suspicious lesions or bleeding. No lymphadenopathy, nodules, masses.  NEURO/ PSYCH:  No neurologic signs such as weakness numbness. No depression anxiety. IMM/ Allergy: No unusual infections.  Allergy .   REST of 12 system review negative except as per HPI   Past Medical History:  Diagnosis Date  . Asthma   . Gestational hypertension   . Perinatal arterial ischemic stroke   . Scoliosis   . Stroke [facility]) birth   rt hemiparesis  Perinatal  .  Supervision of other normal pregnancy 05/04/2014    Past Surgical History:  Procedure Laterality Date  . FOOT SURGERY     rt foot  . FOOT SURGERY     Bone placed  . moles removed     from back    Family History  Problem Relation Age of Onset  . Hypertension Mother   . Asthma Mother   . Hypertension Father   . Sleep apnea Father   . Asthma Sister   . Cancer Maternal Grandfather        pancreatic  . Cancer Paternal Grandmother        cancer all over  . Other Paternal Grandfather        brain damage  . Other Maternal Grandmother        heart problems      Outpatient Medications Prior to Visit  Medication Sig Dispense Refill  . citalopram (CELEXA) 10 MG tablet TAKE 1 TABLET BY MOUTH EVERY DAY 90 tablet 1  . ibuprofen (ADVIL) 800 MG tablet Take 1 tablet (800 mg total) by mouth 3 (three) times daily. 21 tablet 0  . ketoconazole (NIZORAL) 2 % shampoo ketoconazole 2 % shampoo  APPLY TO THE SCALP 3  TIMES WEEKLY APPLY TO DAMP SCALP, LATHER, LET SIT 5 MINUTES AND RINSE    . Multiple Vitamins-Calcium (ONE-A-DAY WOMENS PO) Take 1 tablet by mouth daily.    Marland Kitchen omeprazole (PRILOSEC) 20 MG capsule TAKE 1 CAPSULE BY MOUTH EVERY DAY 30 capsule 2  . ondansetron (ZOFRAN ODT) 4 MG disintegrating tablet Take 1 tablet (4 mg total) by mouth every 8 (eight) hours as needed for nausea or vomiting. 20 tablet 1  . PARAGARD INTRAUTERINE COPPER IU 1 application by Intrauterine route once.     Marland Kitchen PROAIR HFA 108 (90 Base) MCG/ACT inhaler INHALE 1-2 PUFFS INTO THE LUNGS EVERY 6 (SIX) HOURS AS NEEDED FOR WHEEZING OR SHORTNESS OF BREATH. (Patient taking differently: Inhale 1-2 puffs into the lungs every 6 (six) hours as needed for wheezing or shortness of breath. ) 8.5 Inhaler 0  . metroNIDAZOLE (FLAGYL) 500 MG tablet Take 1 tablet (500 mg total) by mouth 2 (two) times daily. 14 tablet 0   Facility-Administered Medications Prior to Visit  Medication Dose Route Frequency Provider Last Rate Last Admin  .  ondansetron (ZOFRAN) injection 4 mg  4 mg Intramuscular Q8H PRN Jennise Both, Standley Brooking, MD   4 mg at 01/11/19 1530     EXAM:  BP 122/74 (BP Location: Right Arm, Patient Position: Sitting, Cuff Size: Large)   Pulse 96   Temp 98.1 F (36.7 C)   Ht '5\' 2"'  (1.575 m)   Wt 254 lb (115.2 kg)   SpO2 97%   BMI 46.46 kg/m   Body mass index is 46.46 kg/m. Wt Readings from Last 3 Encounters:  12/25/19 254 lb (115.2 kg)  09/12/19 238 lb (108 kg)  01/11/19 236 lb 3.2 oz (107.1 kg)      Physical Exam: Vital signs reviewed YOM:AYOK is a well-developed well-nourished alert cooperative    who appearsr stated age in no acute distress.  HEENT: normocephalic atraumatic , Eyes: PERRL , conjunctiva clear, Nares: paten,t no deformity discharge or tenderness., Ears: no deformity EAC's clear TMs with normal landmarks. Mouth:masked NECK: supple without masses, thyromegaly or bruits. CHEST/PULM:  Clear to auscultation and percussion breath sounds equal no wheeze , rales or rhonchi. No chest wall deformities or tenderness. Breast: deferred per gyne CV: PMI is nondisplaced, S1 S2 no gallops, murmurs, rubs. Peripheral pulses are full without delay.No JVD .  ABDOMEN: Bowel sounds normal nontender  No guard or rebound, no hepato splenomegal no CVA tenderness.   Extremtities:  No clubbing cyanosis or edema, no acute joint swelling or redness   Right upper more than lower  Atrophy weakness   Ambulatory  Independent  NEURO:  Oriented x3, cranial nerves 3-12 appear to be intact,baseline hemiparesis right  SKIN: No acute rashes normal turgor, color, no bruising or petechiae. PSYCH: Oriented, good eye contact, no obvious depression anxiety, cognition and judgment appear normal. LN: no cervical axillary  adenopathy  Lab Results  Component Value Date   WBC 13.2 (H) 09/12/2019   HGB 16.0 (H) 09/12/2019   HCT 48.2 (H) 09/12/2019   PLT 483 (H) 09/12/2019   GLUCOSE 92 09/12/2019   CHOL 179 10/19/2018   TRIG 209.0 (H)  10/19/2018   HDL 33.70 (L) 10/19/2018   LDLDIRECT 131.0 10/19/2018   LDLCALC 115 (H) 10/23/2016   ALT 29 09/12/2019   AST 22 09/12/2019   NA 138 09/12/2019   K 3.6 09/12/2019   CL 102 09/12/2019   CREATININE 0.61 09/12/2019   BUN 10 09/12/2019   CO2 22 09/12/2019  TSH 1.63 10/19/2018   HGBA1C 5.4 10/15/2017   MICROALBUR 0.9 09/20/2012    BP Readings from Last 3 Encounters:  12/25/19 122/74  09/13/19 (!) 139/99  01/11/19 120/62      ASSESSMENT AND PLAN:  Discussed the following assessment and plan:    ICD-10-CM   1. Visit for preventive health examination  B09.62 Basic metabolic panel    CBC with Differential    Hepatic function panel    Lipid panel    TSH    T4, Free (Thyrox)  2. Medication management  E36.629 Basic metabolic panel    CBC with Differential    Hepatic function panel    Lipid panel    TSH    T4, Free (Thyrox)   citalopram helpful continue   3. Obesity (BMI 30-39.9)  U76.5 Basic metabolic panel    CBC with Differential    Hepatic function panel    Lipid panel    TSH    T4, Free (Thyrox)  4. Hemiparesis affecting right side as late effect of cerebrovascular accident [facility])  Y65.035 Basic metabolic panel    CBC with Differential    Hepatic function panel    Lipid panel    TSH    T4, Free (Thyrox)  5. Hyperlipidemia, unspecified hyperlipidemia type  W65.6 Basic metabolic panel    CBC with Differential    Hepatic function panel    Lipid panel    TSH    T4, Free (Thyrox)  6. Snoring  R06.83 Ambulatory referral to Pulmonology  7. Elevated hemoglobin (HCC)  D58.2 Ambulatory referral to Pulmonology  8. Family history of sleep apnea  Z82.0 Ambulatory referral to Pulmonology  9. Weight gain  R63.5    dex get weight back down  and disc plan   10. History of 2019 novel coronavirus disease (COVID-19)  Z86.16    Lab due get fasting  Lab    Plan pulm consult about poss osa   Pos fam hx and recent weight gain but  Work on weight loss   Counseled  regarding healthy nutrition, exercise, sleep, injury prevention, calcium vit d and healthy weight .  Patient Care Team: Reon Hunley, Standley Brooking, MD as PCP - General Gaynell Face Princess Bruins, MD (Neurology) Marybelle Killings, MD (Orthopedic Surgery) Arvella Nigh, MD as Consulting Physician (Obstetrics and Gynecology) Patient Instructions  Get weight back down.   As planned .   Will do referral to pulmonary for evaluation for possible sleep apnea .   Get fasting lab  appt  .   Plan   Follow up depnding on   Labs  Or 6 months    Preventive Care 8-69 Years Old, Female Preventive care refers to visits with your health care provider and lifestyle choices that can promote health and wellness. This includes:  A yearly physical exam. This may also be called an annual well check.  Regular dental visits and eye exams.  Immunizations.  Screening for certain conditions.  Healthy lifestyle choices, such as eating a healthy diet, getting regular exercise, not using drugs or products that contain nicotine and tobacco, and limiting alcohol use. What can I expect for my preventive care visit? Physical exam Your health care provider will check your:  Height and weight. This may be used to calculate body mass index (BMI), which tells if you are at a healthy weight.  Heart rate and blood pressure.  Skin for abnormal spots. Counseling Your health care provider may ask you questions about your:  Alcohol,  tobacco, and drug use.  Emotional well-being.  Home and relationship well-being.  Sexual activity.  Eating habits.  Work and work Statistician.  Method of birth control.  Menstrual cycle.  Pregnancy history. What immunizations do I need?  Influenza (flu) vaccine  This is recommended every year. Tetanus, diphtheria, and pertussis (Tdap) vaccine  You may need a Td booster every 10 years. Varicella (chickenpox) vaccine  You may need this if you have not been vaccinated. Human  papillomavirus (HPV) vaccine  If recommended by your health care provider, you may need three doses over 6 months. Measles, mumps, and rubella (MMR) vaccine  You may need at least one dose of MMR. You may also need a second dose. Meningococcal conjugate (MenACWY) vaccine  One dose is recommended if you are age 67-21 years and a first-year college student living in a residence hall, or if you have one of several medical conditions. You may also need additional booster doses. Pneumococcal conjugate (PCV13) vaccine  You may need this if you have certain conditions and were not previously vaccinated. Pneumococcal polysaccharide (PPSV23) vaccine  You may need one or two doses if you smoke cigarettes or if you have certain conditions. Hepatitis A vaccine  You may need this if you have certain conditions or if you travel or work in places where you may be exposed to hepatitis A. Hepatitis B vaccine  You may need this if you have certain conditions or if you travel or work in places where you may be exposed to hepatitis B. Haemophilus influenzae type b (Hib) vaccine  You may need this if you have certain conditions. You may receive vaccines as individual doses or as more than one vaccine together in one shot (combination vaccines). Talk with your health care provider about the risks and benefits of combination vaccines. What tests do I need?  Blood tests  Lipid and cholesterol levels. These may be checked every 5 years starting at age 49.  Hepatitis C test.  Hepatitis B test. Screening  Diabetes screening. This is done by checking your blood sugar (glucose) after you have not eaten for a while (fasting).  Sexually transmitted disease (STD) testing.  BRCA-related cancer screening. This may be done if you have a family history of breast, ovarian, tubal, or peritoneal cancers.  Pelvic exam and Pap test. This may be done every 3 years starting at age 88. Starting at age 70, this may be  done every 5 years if you have a Pap test in combination with an HPV test. Talk with your health care provider about your test results, treatment options, and if necessary, the need for more tests. Follow these instructions at home: Eating and drinking   Eat a diet that includes fresh fruits and vegetables, whole grains, lean protein, and low-fat dairy.  Take vitamin and mineral supplements as recommended by your health care provider.  Do not drink alcohol if: ? Your health care provider tells you not to drink. ? You are pregnant, may be pregnant, or are planning to become pregnant.  If you drink alcohol: ? Limit how much you have to 0-1 drink a day. ? Be aware of how much alcohol is in your drink. In the [place] one drink equals one 12 oz bottle of beer (355 mL), one 5 oz glass of wine (148 mL), or one 1 oz glass of hard liquor (44 mL). Lifestyle  Take daily care of your teeth and gums.  Stay active. Exercise for at least 30 minutes  on 5 or more days each week.  Do not use any products that contain nicotine or tobacco, such as cigarettes, e-cigarettes, and chewing tobacco. If you need help quitting, ask your health care provider.  If you are sexually active, practice safe sex. Use a condom or other form of birth control (contraception) in order to prevent pregnancy and STIs (sexually transmitted infections). If you plan to become pregnant, see your health care provider for a preconception visit. What's next?  Visit your health care provider once a year for a well check visit.  Ask your health care provider how often you should have your eyes and teeth checked.  Stay up to date on all vaccines. This information is not intended to replace advice given to you by your health care provider. Make sure you discuss any questions you have with your health care provider. Document Revised: 08/11/2018 Document Reviewed: 08/11/2018 Elsevier Patient Education  2020 Blyn Torre [place] M.D.

## 2019-12-25 NOTE — Patient Instructions (Addendum)
Get weight back down.   As planned .   Will do referral to pulmonary for evaluation for possible sleep apnea .   Get fasting lab  appt  .   Plan   Follow up depnding on   Labs  Or 6 months    Preventive Care 75-31 Years Old, Female Preventive care refers to visits with your health care provider and lifestyle choices that can promote health and wellness. This includes:  A yearly physical exam. This may also be called an annual well check.  Regular dental visits and eye exams.  Immunizations.  Screening for certain conditions.  Healthy lifestyle choices, such as eating a healthy diet, getting regular exercise, not using drugs or products that contain nicotine and tobacco, and limiting alcohol use. What can I expect for my preventive care visit? Physical exam Your health care provider will check your:  Height and weight. This may be used to calculate body mass index (BMI), which tells if you are at a healthy weight.  Heart rate and blood pressure.  Skin for abnormal spots. Counseling Your health care provider may ask you questions about your:  Alcohol, tobacco, and drug use.  Emotional well-being.  Home and relationship well-being.  Sexual activity.  Eating habits.  Work and work Statistician.  Method of birth control.  Menstrual cycle.  Pregnancy history. What immunizations do I need?  Influenza (flu) vaccine  This is recommended every year. Tetanus, diphtheria, and pertussis (Tdap) vaccine  You may need a Td booster every 10 years. Varicella (chickenpox) vaccine  You may need this if you have not been vaccinated. Human papillomavirus (HPV) vaccine  If recommended by your health care provider, you may need three doses over 6 months. Measles, mumps, and rubella (MMR) vaccine  You may need at least one dose of MMR. You may also need a second dose. Meningococcal conjugate (MenACWY) vaccine  One dose is recommended if you are age 31-21 years and a  first-year college student living in a residence hall, or if you have one of several medical conditions. You may also need additional booster doses. Pneumococcal conjugate (PCV13) vaccine  You may need this if you have certain conditions and were not previously vaccinated. Pneumococcal polysaccharide (PPSV23) vaccine  You may need one or two doses if you smoke cigarettes or if you have certain conditions. Hepatitis A vaccine  You may need this if you have certain conditions or if you travel or work in places where you may be exposed to hepatitis A. Hepatitis B vaccine  You may need this if you have certain conditions or if you travel or work in places where you may be exposed to hepatitis B. Haemophilus influenzae type b (Hib) vaccine  You may need this if you have certain conditions. You may receive vaccines as individual doses or as more than one vaccine together in one shot (combination vaccines). Talk with your health care provider about the risks and benefits of combination vaccines. What tests do I need?  Blood tests  Lipid and cholesterol levels. These may be checked every 5 years starting at age 31.  Hepatitis C test.  Hepatitis B test. Screening  Diabetes screening. This is done by checking your blood sugar (glucose) after you have not eaten for a while (fasting).  Sexually transmitted disease (STD) testing.  BRCA-related cancer screening. This may be done if you have a family history of breast, ovarian, tubal, or peritoneal cancers.  Pelvic exam and Pap test. This may be  done every 3 years starting at age 31. Starting at age 21, this may be done every 5 years if you have a Pap test in combination with an HPV test. Talk with your health care provider about your test results, treatment options, and if necessary, the need for more tests. Follow these instructions at home: Eating and drinking   Eat a diet that includes fresh fruits and vegetables, whole grains, lean  protein, and low-fat dairy.  Take vitamin and mineral supplements as recommended by your health care provider.  Do not drink alcohol if: ? Your health care provider tells you not to drink. ? You are pregnant, may be pregnant, or are planning to become pregnant.  If you drink alcohol: ? Limit how much you have to 0-1 drink a day. ? Be aware of how much alcohol is in your drink. In the [place] one drink equals one 12 oz bottle of beer (355 mL), one 5 oz glass of wine (148 mL), or one 1 oz glass of hard liquor (44 mL). Lifestyle  Take daily care of your teeth and gums.  Stay active. Exercise for at least 30 minutes on 5 or more days each week.  Do not use any products that contain nicotine or tobacco, such as cigarettes, e-cigarettes, and chewing tobacco. If you need help quitting, ask your health care provider.  If you are sexually active, practice safe sex. Use a condom or other form of birth control (contraception) in order to prevent pregnancy and STIs (sexually transmitted infections). If you plan to become pregnant, see your health care provider for a preconception visit. What's next?  Visit your health care provider once a year for a well check visit.  Ask your health care provider how often you should have your eyes and teeth checked.  Stay up to date on all vaccines. This information is not intended to replace advice given to you by your health care provider. Make sure you discuss any questions you have with your health care provider. Document Revised: 08/11/2018 Document Reviewed: 08/11/2018 Elsevier Patient Education  2020 [employer].

## 2019-12-29 ENCOUNTER — Other Ambulatory Visit (INDEPENDENT_AMBULATORY_CARE_PROVIDER_SITE_OTHER): Payer: Managed Care, Other (non HMO)

## 2019-12-29 ENCOUNTER — Other Ambulatory Visit: Payer: Self-pay

## 2019-12-29 DIAGNOSIS — Z0000 Encounter for general adult medical examination without abnormal findings: Secondary | ICD-10-CM | POA: Diagnosis not present

## 2019-12-29 DIAGNOSIS — E669 Obesity, unspecified: Secondary | ICD-10-CM | POA: Diagnosis not present

## 2019-12-29 DIAGNOSIS — Z79899 Other long term (current) drug therapy: Secondary | ICD-10-CM | POA: Diagnosis not present

## 2019-12-29 DIAGNOSIS — I69351 Hemiplegia and hemiparesis following cerebral infarction affecting right dominant side: Secondary | ICD-10-CM

## 2019-12-29 DIAGNOSIS — E785 Hyperlipidemia, unspecified: Secondary | ICD-10-CM

## 2019-12-29 LAB — BASIC METABOLIC PANEL
BUN: 22 mg/dL (ref 6–23)
CO2: 24 mEq/L (ref 19–32)
Calcium: 9.4 mg/dL (ref 8.4–10.5)
Chloride: 106 mEq/L (ref 96–112)
Creatinine, Ser: 0.57 mg/dL (ref 0.40–1.20)
GFR: 123.95 mL/min (ref >60.00)
Glucose, Bld: 110 mg/dL — ABNORMAL HIGH (ref 70–99)
Potassium: 4.3 mEq/L (ref 3.5–5.1)
Sodium: 139 mEq/L (ref 135–145)

## 2019-12-29 LAB — LIPID PANEL
Cholesterol: 200 mg/dL (ref 0–200)
HDL: 41.1 mg/dL (ref >39.00)
LDL Cholesterol: 129 mg/dL — ABNORMAL HIGH (ref 0–99)
NonHDL: 159.15
Total CHOL/HDL Ratio: 5
Triglycerides: 152 mg/dL — ABNORMAL HIGH (ref 0.0–149.0)
VLDL: 30.4 mg/dL (ref 0.0–40.0)

## 2019-12-29 LAB — HEPATIC FUNCTION PANEL
ALT: 20 U/L (ref 0–35)
AST: 16 U/L (ref 0–37)
Albumin: 4.5 g/dL (ref 3.5–5.2)
Alkaline Phosphatase: 65 U/L (ref 39–117)
Bilirubin, Direct: 0.1 mg/dL (ref 0.0–0.3)
Total Bilirubin: 1.1 mg/dL (ref 0.2–1.2)
Total Protein: 6.8 g/dL (ref 6.0–8.3)

## 2019-12-29 LAB — CBC WITH DIFFERENTIAL/PLATELET
Basophils Absolute: 0.1 10*3/uL (ref 0.0–0.1)
Basophils Relative: 0.7 % (ref 0.0–3.0)
Eosinophils Absolute: 0.4 10*3/uL (ref 0.0–0.7)
Eosinophils Relative: 4 % (ref 0.0–5.0)
HCT: 43.7 % (ref 36.0–46.0)
Hemoglobin: 14.9 g/dL (ref 12.0–15.0)
Lymphocytes Relative: 31.7 % (ref 12.0–46.0)
Lymphs Abs: 3.5 10*3/uL (ref 0.7–4.0)
MCHC: 34.1 g/dL (ref 30.0–36.0)
MCV: 86.3 fl (ref 78.0–100.0)
Monocytes Absolute: 0.6 10*3/uL (ref 0.1–1.0)
Monocytes Relative: 5.6 % (ref 3.0–12.0)
Neutro Abs: 6.4 10*3/uL (ref 1.4–7.7)
Neutrophils Relative %: 58 % (ref 43.0–77.0)
Platelets: 456 10*3/uL — ABNORMAL HIGH (ref 150.0–400.0)
RBC: 5.07 Mil/uL (ref 3.87–5.11)
RDW: 12.8 % (ref 11.5–15.5)
WBC: 11 10*3/uL — ABNORMAL HIGH (ref 4.0–10.5)

## 2019-12-29 LAB — T4, FREE: Free T4: 0.75 ng/dL (ref 0.60–1.60)

## 2019-12-29 LAB — TSH: TSH: 2.35 u[IU]/mL (ref 0.35–4.50)

## 2020-01-18 ENCOUNTER — Other Ambulatory Visit: Payer: Self-pay | Admitting: Internal Medicine

## 2020-04-16 ENCOUNTER — Other Ambulatory Visit: Payer: Self-pay | Admitting: Internal Medicine

## 2020-05-01 ENCOUNTER — Other Ambulatory Visit: Payer: Self-pay | Admitting: Internal Medicine

## 2020-05-01 NOTE — Telephone Encounter (Signed)
Last OV 12/25/2019  Last filled 10/30/19, # 90 with 1 refill  Note on last OV that stated ROV in 6 months depending on labs/med check.  OK to send or does patient need a follow up visit?

## 2020-05-01 NOTE — Telephone Encounter (Signed)
Ok to refill med for another 6 months  And next visit can be  Yearly  cpx

## 2020-06-03 ENCOUNTER — Encounter: Payer: Self-pay | Admitting: Pulmonary Disease

## 2020-06-03 ENCOUNTER — Other Ambulatory Visit: Payer: Self-pay

## 2020-06-03 ENCOUNTER — Ambulatory Visit (INDEPENDENT_AMBULATORY_CARE_PROVIDER_SITE_OTHER): Payer: Managed Care, Other (non HMO) | Admitting: Pulmonary Disease

## 2020-06-03 DIAGNOSIS — G4700 Insomnia, unspecified: Secondary | ICD-10-CM | POA: Insufficient documentation

## 2020-06-03 DIAGNOSIS — F5105 Insomnia due to other mental disorder: Secondary | ICD-10-CM | POA: Diagnosis not present

## 2020-06-03 DIAGNOSIS — G4733 Obstructive sleep apnea (adult) (pediatric): Secondary | ICD-10-CM | POA: Diagnosis not present

## 2020-06-03 DIAGNOSIS — F99 Mental disorder, not otherwise specified: Secondary | ICD-10-CM | POA: Diagnosis not present

## 2020-06-03 NOTE — Progress Notes (Signed)
Subjective:    Patient ID: Tina Mckee, female    DOB: 1991-08-25, 29 y.o.   MRN: [id number]  HPI   Chief Complaint  Patient presents with  . Consult    Patient has trouble going to sleep and staying asleep, snores and feels tired all the time. Wakes up 5-6 times a night. Denies waking up gasping for air. Shortness of breath all the time, wheezing.   Tina Mckee is a 29 year old woman presents for evaluation of sleep disordered breathing. She has a right hemiparesis since birth and has battled anxiety and depression for a few years.  She reports sleep onset insomnia starting as a teenager which is much worse now.  She has a 31 year old and a 84-year-old daughter. She reports nonrefreshing sleep and being tired all the time loud snoring has been noted by her husband and her daughter. Epworth sleepiness score is 17 and she reports sleepiness while sitting and reading, watching TV, laying down to rest in the afternoons and sitting quietly.  Naps on not, and only cause more tiredness.  Bedtime is between 10:11 PM, sleep latency can be up to 3 hours, she sleeps on her left side with 2 pillows, reports 5-6 nocturnal awakenings.  She described herself as a light sleeper and awakens at the slightest noise.  She will be up around 6 AM for her 38 year old daughter to go to school and then finally out of bed around 8 AM feeling tired with dryness of mouth and occasional headaches. She has gained 30 pounds in the last year  There is no history suggestive of cataplexy, sleep paralysis or parasomnias  She has cut down soda intake, just has no energy to exercise.  Partly because of her right leg weakness, her aerobic activity is limited  PMH -mild intermittent asthma, hypertension, anxiety and depression  TSH nml 12/2019  Significant tests/ events reviewed  PFTs 10/2017 >> nml   Past Medical History:  Diagnosis Date  . Asthma   . Gestational hypertension   . Perinatal arterial ischemic stroke    . Scoliosis   . Stroke [facility]) birth   rt hemiparesis  Perinatal  . Supervision of other normal pregnancy 05/04/2014   Past Surgical History:  Procedure Laterality Date  . FOOT SURGERY     rt foot  . FOOT SURGERY     Bone placed  . moles removed     from back    Allergies  Allergen Reactions  . Cephalexin Rash and Itching    Social History   Socioeconomic History  . Marital status: Married    Spouse name: Not on file  . Number of children: 2  . Years of education: Not on file  . Highest education level: Not on file  Occupational History  . Occupation: Therapist, art  Tobacco Use  . Smoking status: Former Smoker    Types: Cigarettes    Quit date: 07/11/2007    Years since quitting: 12.9  . Smokeless tobacco: Never Used  Vaping Use  . Vaping Use: Never used  Substance and Sexual Activity  . Alcohol use: Yes    Alcohol/week: 1.0 standard drink    Types: 1 Standard drinks or equivalent per week    Comment: Every other day  . Drug use: No  . Sexual activity: Yes    Birth control/protection: I.U.D.  Other Topics Concern  . 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     Currently nonsmoking    HH of 4 no pets     had mild preeclampsia with vaginal childbirth.  2011   cb 12 15 preeclampsi no complicatinos   ocass etoh.    Exercise  Walking     Sleep ok    caffiene limited per day .             Social Determinants of Health   Financial Resource Strain:   . Difficulty of Paying Living Expenses:   Food Insecurity:   . Worried About Charity fundraiser in the Last Year:   . Arboriculturist in the Last Year:   Transportation Needs:   . Film/video editor (Medical):   Marland Kitchen Lack of Transportation (Non-Medical):   Physical Activity:   . Days of Exercise per Week:   . Minutes of Exercise per Session:   Stress:   . Feeling of Stress :   Social Connections:   . Frequency  of Communication with Friends and Family:   . Frequency of Social Gatherings with Friends and Family:   . Attends Religious Services:   . Active Member of Clubs or Organizations:   . Attends Archivist Meetings:   Marland Kitchen Marital Status:   Intimate Partner Violence:   . Fear of Current or Ex-Partner:   . Emotionally Abused:   Marland Kitchen Physically Abused:   . Sexually Abused:      Family History  Problem Relation Age of Onset  . Hypertension Mother   . Asthma Mother   . Hypertension Father   . Sleep apnea Father   . Asthma Sister   . Cancer Maternal Grandfather        pancreatic  . Cancer Paternal Grandmother        cancer all over  . Other Paternal Grandfather        brain damage  . Other Maternal Grandmother        heart problems      Review of Systems Constitutional: negative for anorexia, fevers and sweats + wt gain Eyes: negative for irritation, redness and visual disturbance  Ears, nose, mouth, throat, and face: negative for earaches, epistaxis,  +nasal congestion and sore throat  Respiratory: negative for cough,  sputum and wheezing + dyspnea on exertion Cardiovascular: negative for chest pain, lower extremity edema, orthopnea, palpitations and syncope  Gastrointestinal: negative for abdominal pain, constipation, diarrhea, melena, nausea and vomiting  Genitourinary:negative for dysuria, frequency and hematuria  Hematologic/lymphatic: negative for bleeding, easy bruising and lymphadenopathy  Musculoskeletal:negative for arthralgias, muscle weakness and stiff joints  Neurological: negative for coordination problems, gait problems, headaches and weakness  Endocrine: negative for diabetic symptoms including polydipsia, polyuria and weight loss Psych - + anxiety & sadness of mood     Objective:   Physical Exam  Gen. Pleasant, obese, in no distress ENT - no lesions, no post nasal drip, small mouth, class 3 airway Neck: No JVD, no thyromegaly, no carotid bruits Lungs:  no use of accessory muscles, no dullness to percussion, decreased without rales or rhonchi  Cardiovascular: Rhythm regular, heart sounds  normal, no murmurs or gallops, no peripheral edema Musculoskeletal: No deformities, no cyanosis or clubbing , no tremors       Assessment & Plan:

## 2020-06-03 NOTE — Assessment & Plan Note (Signed)
Given excessive daytime somnolence, narrow pharyngeal exam, witnessed apneas & loud snoring, obstructive sleep apnea is very likely & an overnight polysomnogram will be scheduled as a home study. The pathophysiology of obstructive sleep apnea , it's cardiovascular consequences & modes of treatment including CPAP were discused with the patient in detail & they evidenced understanding.  Pre test prob is high.  Her dad has OSA so she would be willing to use CPAP if needed.  She may be a mouth breather and may need a full facemask

## 2020-06-03 NOTE — Assessment & Plan Note (Signed)
She also has longstanding insomnia of sleep onset which may be related to underlying anxiety and depression.  She denies suicidal ideation at this time and has had these in the past.  She is on Celexa and may benefit from a sedating antidepressant such as trazodone or mirtazapine. We will first undertake sleep testing Meantime, she can trial melatonin 5 to 10 mg 2 hours prior to bedtime

## 2020-06-03 NOTE — Addendum Note (Signed)
Addended by: Lia Foyer R on: 06/03/2020 10:08 AM   Modules accepted: Orders

## 2020-06-03 NOTE — Patient Instructions (Signed)
Home sleep study 

## 2020-07-29 ENCOUNTER — Other Ambulatory Visit: Payer: Self-pay

## 2020-07-29 ENCOUNTER — Ambulatory Visit: Payer: Managed Care, Other (non HMO)

## 2020-07-29 DIAGNOSIS — G4733 Obstructive sleep apnea (adult) (pediatric): Secondary | ICD-10-CM | POA: Diagnosis not present

## 2020-07-29 DIAGNOSIS — F5105 Insomnia due to other mental disorder: Secondary | ICD-10-CM

## 2020-08-12 ENCOUNTER — Telehealth: Payer: Self-pay | Admitting: Pulmonary Disease

## 2020-08-12 ENCOUNTER — Other Ambulatory Visit: Payer: Self-pay

## 2020-08-12 DIAGNOSIS — G4733 Obstructive sleep apnea (adult) (pediatric): Secondary | ICD-10-CM

## 2020-08-12 NOTE — Progress Notes (Signed)
amb  

## 2020-08-12 NOTE — Telephone Encounter (Signed)
Informed of results of sleep study order sent to dme.pt has 6 week f/u with tammy parret-np

## 2020-08-12 NOTE — Telephone Encounter (Signed)
HST showed overall mild OSA with AHI 13/ hr However, events were severe during supine sleep  Hence Suggest treatment with autoCPAP  5-15 cm, mask of choice OV with me/APP in 6 wks

## 2020-08-15 NOTE — Telephone Encounter (Addendum)
Spoke with [facility]- [phone] regarding  CPAP order. On the patient's order they did not see any place that said CPAP.They are sending over a form to fill out and to be faxed back . Will keep this note open until we fax the form back to [facility].

## 2020-08-15 NOTE — Telephone Encounter (Signed)
Calling needing to speak w/someone about pt order can be reached @ [phone].Hillery Hunter

## 2020-08-16 NOTE — Telephone Encounter (Signed)
Got patient fax from [facility] and will put in [place] to fill out. Will close encounter. Nothing else further needed.

## 2020-09-16 NOTE — Telephone Encounter (Signed)
Ok to refill this   X 3

## 2020-09-17 ENCOUNTER — Other Ambulatory Visit: Payer: Self-pay

## 2020-09-17 MED ORDER — KETOCONAZOLE 2 % EX SHAM: MEDICATED_SHAMPOO | CUTANEOUS | 3 refills | Status: DC

## 2020-10-10 ENCOUNTER — Telehealth: Payer: Self-pay | Admitting: Adult Health

## 2020-10-10 NOTE — Telephone Encounter (Signed)
I called [facility] to get access to the patient's download in [place].  Had to leave message to get access.

## 2020-10-11 ENCOUNTER — Ambulatory Visit: Payer: Managed Care, Other (non HMO) | Admitting: Adult Health

## 2020-10-27 ENCOUNTER — Other Ambulatory Visit: Payer: Self-pay | Admitting: Internal Medicine

## 2020-10-28 MED ORDER — CITALOPRAM HYDROBROMIDE 10 MG PO TABS
10.0000 mg | ORAL_TABLET | Freq: Every day | ORAL | 1 refills | Status: DC
Start: 2020-10-28 — End: 2021-04-14

## 2020-11-01 ENCOUNTER — Ambulatory Visit: Payer: Managed Care, Other (non HMO) | Admitting: Family Medicine

## 2020-11-01 ENCOUNTER — Telehealth: Payer: Managed Care, Other (non HMO) | Admitting: Emergency Medicine

## 2020-11-01 DIAGNOSIS — R300 Dysuria: Secondary | ICD-10-CM | POA: Diagnosis not present

## 2020-11-01 MED ORDER — SULFAMETHOXAZOLE-TRIMETHOPRIM 800-160 MG PO TABS: 1.0000 | ORAL_TABLET | Freq: Two times a day (BID) | ORAL | 0 refills | Status: DC

## 2020-11-01 MED ORDER — FLUCONAZOLE 150 MG PO TABS: 150.0000 mg | ORAL_TABLET | Freq: Once | ORAL | 0 refills | Status: AC

## 2020-11-01 NOTE — Progress Notes (Signed)
We are sorry that you are not feeling well.  Here is how we plan to help!  Based on what you shared with me it looks like you most likely have a simple urinary tract infection.  A UTI (Urinary Tract Infection) is a bacterial infection of the bladder.  Most cases of urinary tract infections are simple to treat but a key part of your care is to encourage you to drink plenty of fluids and watch your symptoms carefully.  I have prescribed Bactrim DS One tablet twice a day for 5 days.  I also sent an RX for Diflucan in case you have or develop a yeast infection.  Your symptoms should gradually improve. Call us if the burning in your urine worsens, you develop worsening fever, back pain or pelvic pain or if your symptoms do not resolve after completing the antibiotic.  Urinary tract infections can be prevented by drinking plenty of water to keep your body hydrated.  Also be sure when you wipe, wipe from front to back and don't hold it in!  If possible, empty your bladder every 4 hours.  Your e-visit answers were reviewed by a board certified advanced clinical practitioner to complete your personal care plan.  Depending on the condition, your plan could have included both over the counter or prescription medications.  If there is a problem please reply  once you have received a response from your provider.  Your safety is important to [place].  If you have drug allergies check your prescription carefully.    You can use MyChart to ask questions about today's visit, request a non-urgent call back, or ask for a work or school excuse for 24 hours related to this e-Visit. If it has been greater than 24 hours you will need to follow up with your provider, or enter a new e-Visit to address those concerns.   You will get an e-mail in the next two days asking about your experience.  I hope that your e-visit has been valuable and will speed your recovery. Thank you for using e-visits.   Approximately 5 minutes  was used in reviewing the patient's chart, questionnaire, prescribing medications, and documentation.

## 2020-12-24 NOTE — Progress Notes (Signed)
Chief Complaint  Patient presents with   Annual Exam    Is fasting     HPI: Patient  Tina Mckee  30 y.o. comes in today for [place] visit  Has  Gained a lot of weight  Over past year or so   Stress less activity  Has done recent changes   Anxiety and irritability at times .  Husband thinks she has moodiness  Had covid booster 11 21  Just stopped   Sodas and now water and black coffee.almost a year.  BP was up  At  gyne  [facility] still no  Equipment at this time  Health Maintenance  Topic Date Due   Hepatitis C Screening  Never done   PAP SMEAR-Modifier  10/14/2019   COVID-19 Vaccine (4 - Booster for Moderna series) 04/18/2021   TETANUS/TDAP  11/14/2024   INFLUENZA VACCINE  Completed   HIV Screening  Completed   Health Maintenance Review LIFESTYLE:  Exercise:   Walking  This week.   Tobacco/ETS: no Alcohol:  Sugar beverages: Sleep: same waiting on cpap  Drug use: no HH of 4  1 dog.  Ages 38 and 27  Tough  With virtual  Work:homemaker.     ROS:  GEN/ HEENT: No fever, significant weight changes sweats headaches vision problems hearing changes, CV/ PULM; No chest pain shortness of breath cough, syncope,edema  change in exercise tolerance. GI /GU: No adominal pain, vomiting, change in bowel habits. No blood in the stool. No significant GU symptoms. SKIN/HEME: ,no acute skin rashes suspicious lesions or bleeding. No lymphadenopathy, nodules, masses.  NEURO/ PSYCH:  No neurologic signs such as weakness numbness. No depression anxiety. IMM/ Allergy: No unusual infections.  Allergy .   REST of 12 system review negative except as per HPI   Past Medical History:  Diagnosis Date   Asthma    Gestational hypertension    Perinatal arterial ischemic stroke ([place])    Scoliosis    Stroke ([place]) birth   rt hemiparesis  Perinatal   Supervision of other normal pregnancy 05/04/2014    Past Surgical History:  Procedure Laterality Date   FOOT SURGERY      rt foot   FOOT SURGERY     Bone placed   moles removed     from back    Family History  Problem Relation Age of Onset   Hypertension Mother    Asthma Mother    Hypertension Father    Sleep apnea Father    Asthma Sister    Cancer Maternal Grandfather        pancreatic   Cancer Paternal Grandmother        cancer all over   Other Paternal Grandfather        brain damage   Other Maternal Grandmother        heart problems    Social History   Socioeconomic History   Marital status: Married    Spouse name: Not on file   Number of children: 2   Years of education: Not on file   Highest education level: Not on file  Occupational History   Occupation: Customer Service  Tobacco Use   Smoking status: Former Smoker    Types: Cigarettes    Quit date: 07/11/2007    Years since quitting: 13.4   Smokeless tobacco: Never Used  Vaping Use   Vaping Use: Never used  Substance and Sexual Activity   Alcohol use: Yes    Alcohol/week: 1.0  standard drink    Types: 1 Standard drinks or equivalent per week    Comment: Every other day   Drug use: No   Sexual activity: Yes    Birth control/protection: I.U.D.  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     Currently nonsmoking    HH of 4 no pets     had mild preeclampsia with vaginal childbirth.  2011   cb 12 15 preeclampsi no complicatinos   ocass etoh.    Exercise  Walking     Sleep ok    caffiene limited per day .             Social Determinants of Health   Financial Resource Strain: Not on file  Food Insecurity: Not on file  Transportation Needs: Not on file  Physical Activity: Not on file  Stress: Not on file  Social Connections: Not on file    Outpatient Medications Prior to Visit  Medication Sig Dispense Refill   citalopram (CELEXA) 10 MG tablet Take 1 tablet (10 mg total) by  mouth daily. 90 tablet 1   ketoconazole (NIZORAL) 2 % shampoo ketoconazole 2 % shampoo  APPLY TO THE SCALP 3 TIMES WEEKLY APPLY TO DAMP SCALP, LATHER, LET SIT 5 MINUTES AND RINSE 120 mL 3   Multiple Vitamins-Calcium (ONE-A-DAY WOMENS PO) Take 1 tablet by mouth daily.     omeprazole (PRILOSEC) 20 MG capsule TAKE 1 CAPSULE BY MOUTH EVERY DAY 90 capsule 2   PARAGARD INTRAUTERINE COPPER IU 1 application by Intrauterine route once.      PROAIR HFA 108 (90 Base) MCG/ACT inhaler INHALE 1-2 PUFFS INTO THE LUNGS EVERY 6 (SIX) HOURS AS NEEDED FOR WHEEZING OR SHORTNESS OF BREATH. (Patient taking differently: Inhale 1-2 puffs into the lungs every 6 (six) hours as needed for wheezing or shortness of breath.) 8.5 Inhaler 0   sulfamethoxazole-trimethoprim (BACTRIM DS) 800-160 MG tablet Take 1 tablet by mouth 2 (two) times daily. (Patient not taking: Reported on 12/25/2020) 10 tablet 0   Facility-Administered Medications Prior to Visit  Medication Dose Route Frequency Provider Last Rate Last Admin   ondansetron (ZOFRAN) injection 4 mg  4 mg Intramuscular Q8H PRN Boubacar Lerette, Standley Brooking, MD   4 mg at 01/11/19 1530     EXAM:  BP (!) 142/82 (BP Location: Left Arm, Patient Position: Sitting, Cuff Size: Large)    Pulse 97    Temp 97.8 F (36.6 C) (Oral)    Ht 5\' 2"  (1.575 m)    Wt 270 lb 9.6 oz (122.7 kg)    SpO2 98%    BMI 49.49 kg/m   Body mass index is 49.49 kg/m. Wt Readings from Last 3 Encounters:  12/25/20 270 lb 9.6 oz (122.7 kg)  06/03/20 259 lb (117.5 kg)  12/25/19 254 lb (115.2 kg)    Physical Exam: Vital signs reviewed GUY:QIHK is a well-developed well-nourished alert cooperative    who appearsr stated age in no acute distress.  HEENT: normocephalic atraumatic , Eyes: PERRL EOM's full, conjunctiva clear, Nares: paten,t no deformity discharge or tenderness., Ears: no deformity EAC's clear TMs with normal landmarks. Mouth:masked NECK: supple without masses, thyromegaly or bruits. CHEST/PULM:   Clear to auscultation and percussion breath sounds equal no wheeze , rales or rhonchi. No chest wall deformities or tenderness. Breast: normal by inspection . No  dimpling, discharge, masses, tenderness or discharge . CV: PMI is nondisplaced, S1 S2 no gallops, murmurs, rubs. Peripheral pulses are full without delay.No JVD .  ABDOMEN: Bowel sounds normal nontender  No guard or rebound, no hepato splenomegal no CVA tenderness.   Extremtities:  No clubbing cyanosis or edema, no acute joint swelling or rednessrle larger than left  D  Stiffness mild spasticity  NEURO:  Oriented x3, cranial nerves 3-12 appear to be intact,right left leg and ue hemiparesis   Ambulatory SKIN: No acute rashes normal turgor, color, no bruising or petechiae. PSYCH: Oriented, good eye contact, no obvious depression anxiety, cognition and judgment appear normal. LN: no cervical axillary  adenopathy  Lab Results  Component Value Date   WBC 9.2 12/25/2020   HGB 14.2 12/25/2020   HCT 41.5 12/25/2020   PLT 438.0 (H) 12/25/2020   GLUCOSE 101 (H) 12/25/2020   CHOL 190 12/25/2020   TRIG 167.0 (H) 12/25/2020   HDL 37.80 (L) 12/25/2020   LDLDIRECT 131.0 10/19/2018   LDLCALC 119 (H) 12/25/2020   ALT 25 12/25/2020   AST 16 12/25/2020   NA 137 12/25/2020   K 4.2 12/25/2020   CL 103 12/25/2020   CREATININE 0.50 12/25/2020   BUN 16 12/25/2020   CO2 28 12/25/2020   TSH 1.12 12/25/2020   HGBA1C 5.4 12/25/2020   MICROALBUR 0.9 09/20/2012    BP Readings from Last 3 Encounters:  12/25/20 (!) 142/82  06/03/20 130/90  12/25/19 122/74    Lab results reviewed with patient   ASSESSMENT AND PLAN:  Discussed the following assessment and plan: Updated labs    ICD-10-CM   1. Visit for preventive health examination  T24.58 Basic metabolic panel    Hemoglobin A1c    Hepatic function panel    CBC with Differential/Platelet    Lipid panel    TSH    T4, free    T4, free    TSH    Lipid panel    CBC with  Differential/Platelet    Hepatic function panel    Hemoglobin K9X    Basic metabolic panel  2. Medication management  I33.825 Basic metabolic panel    Hemoglobin A1c    Hepatic function panel    CBC with Differential/Platelet    Lipid panel    TSH    T4, free    T4, free    TSH    Lipid panel    CBC with Differential/Platelet    Hepatic function panel    Hemoglobin K5L    Basic metabolic panel  3. Hemiparesis affecting right side as late effect of cerebrovascular accident [facility])  Z76.734 Basic metabolic panel    Hemoglobin A1c    Hepatic function panel    CBC with Differential/Platelet    Lipid panel    TSH    T4, free    T4, free    TSH    Lipid panel    CBC with Differential/Platelet    Hepatic function panel    Hemoglobin L9F    Basic metabolic panel  4. Hyperlipidemia, unspecified hyperlipidemia type  X90.2 Basic metabolic panel    Hemoglobin A1c    Hepatic function panel    CBC with Differential/Platelet    Lipid panel    TSH    T4, free    T4, free    TSH    Lipid panel    CBC with Differential/Platelet    Hepatic function panel    Hemoglobin A1c  Basic metabolic panel  5. Hemiparesis, right (HCC)  [id number] Basic metabolic panel    Hemoglobin A1c    Hepatic function panel    CBC with Differential/Platelet    Lipid panel    TSH    T4, free    T4, free    TSH    Lipid panel    CBC with Differential/Platelet    Hepatic function panel    Hemoglobin [id number]    Basic metabolic panel  6. Weight gain  R63.5   7. OSA (obstructive sleep apnea)  G47.33   8. Labile mood  R45.86   disc weight  Gain   Counseled  Mood consider counseling    Related  Get self time and  And exercise.  Lab and plan fu  Work on [employer].  No follow-ups on file.  Patient Care Team: Lacreasha Hinds, Standley Brooking, MD as PCP - General Gaynell Face Princess Bruins, MD (Neurology) Marybelle Killings, MD (Orthopedic Surgery) Arvella Nigh, MD as Consulting Physician (Obstetrics and Gynecology) Patient  Instructions   Will notify you  of labs when available.  Make a plan and decide   If you want to try weight management.   Consider seeing counselor  .   As we discussed .   Will notify you  of labs when available.    Plan follow up depending    Or 4-6 months    Health Maintenance, Female Adopting a healthy lifestyle and getting preventive care are important in promoting health and wellness. Ask your health care provider about:  The right schedule for you to have regular tests and exams.  Things you can do on your own to prevent diseases and keep yourself healthy. What should I know about diet, weight, and exercise? Eat a healthy diet  Eat a diet that includes plenty of vegetables, fruits, low-fat dairy products, and lean protein.  Do not eat a lot of foods that are high in solid fats, added sugars, or sodium.   Maintain a healthy weight Body mass index (BMI) is used to identify weight problems. It estimates body fat based on height and weight. Your health care provider can help determine your BMI and help you achieve or maintain a healthy weight. Get regular exercise Get regular exercise. This is one of the most important things you can do for your health. Most adults should:  Exercise for at least 150 minutes each week. The exercise should increase your heart rate and make you sweat (moderate-intensity exercise).  Do strengthening exercises at least twice a week. This is in addition to the moderate-intensity exercise.  Spend less time sitting. Even light physical activity can be beneficial. Watch cholesterol and blood lipids Have your blood tested for lipids and cholesterol at 32 years of age, then have this test every 5 years. Have your cholesterol levels checked more often if:  Your lipid or cholesterol levels are high.  You are older than 32 years of age.  You are at high risk for heart disease. What should I know about cancer screening? Depending on your health  history and family history, you may need to have cancer screening at various ages. This may include screening for:  Breast cancer.  Cervical cancer.  Colorectal cancer.  Skin cancer.  Lung cancer. What should I know about heart disease, diabetes, and high blood pressure? Blood pressure and heart disease  High blood pressure causes heart disease and increases the risk of stroke. This is more likely to develop in people  who have high blood pressure readings, are of African descent, or are overweight.  Have your blood pressure checked: ? Every 3-5 years if you are 31-77 years of age. ? Every year if you are 74 years old or older. Diabetes Have regular diabetes screenings. This checks your fasting blood sugar level. Have the screening done:  Once every three years after age 66 if you are at a normal weight and have a low risk for diabetes.  More often and at a younger age if you are overweight or have a high risk for diabetes. What should I know about preventing infection? Hepatitis B If you have a higher risk for hepatitis B, you should be screened for this virus. Talk with your health care provider to find out if you are at risk for hepatitis B infection. Hepatitis C Testing is recommended for:  Everyone born from [birth year] through 1967.  Anyone with known risk factors for hepatitis C. Sexually transmitted infections (STIs)  Get screened for STIs, including gonorrhea and chlamydia, if: ? You are sexually active and are younger than 32 years of age. ? You are older than 32 years of age and your health care provider tells you that you are at risk for this type of infection. ? Your sexual activity has changed since you were last screened, and you are at increased risk for chlamydia or gonorrhea. Ask your health care provider if you are at risk.  Ask your health care provider about whether you are at high risk for HIV. Your health care provider may recommend a prescription medicine to  help prevent HIV infection. If you choose to take medicine to prevent HIV, you should first get tested for HIV. You should then be tested every 3 months for as long as you are taking the medicine. Pregnancy  If you are about to stop having your period (premenopausal) and you may become pregnant, seek counseling before you get pregnant.  Take 400 to 800 micrograms (mcg) of folic acid every day if you become pregnant.  Ask for birth control (contraception) if you want to prevent pregnancy. Osteoporosis and menopause Osteoporosis is a disease in which the bones lose minerals and strength with aging. This can result in bone fractures. If you are 42 years old or older, or if you are at risk for osteoporosis and fractures, ask your health care provider if you should:  Be screened for bone loss.  Take a calcium or vitamin D supplement to lower your risk of fractures.  Be given hormone replacement therapy (HRT) to treat symptoms of menopause. Follow these instructions at home: Lifestyle  Do not use any products that contain nicotine or tobacco, such as cigarettes, e-cigarettes, and chewing tobacco. If you need help quitting, ask your health care provider.  Do not use street drugs.  Do not share needles.  Ask your health care provider for help if you need support or information about quitting drugs. Alcohol use  Do not drink alcohol if: ? Your health care provider tells you not to drink. ? You are pregnant, may be pregnant, or are planning to become pregnant.  If you drink alcohol: ? Limit how much you use to 0-1 drink a day. ? Limit intake if you are breastfeeding.  Be aware of how much alcohol is in your drink. In the [place] one drink equals one 12 oz bottle of beer (355 mL), one 5 oz glass of wine (148 mL), or one 1 oz glass of hard liquor (44  mL). General instructions  Schedule regular health, dental, and eye exams.  Stay current with your vaccines.  Tell your health care  provider if: ? You often feel depressed. ? You have ever been abused or do not feel safe at home. Summary  Adopting a healthy lifestyle and getting preventive care are important in promoting health and wellness.  Follow your health care provider's instructions about healthy diet, exercising, and getting tested or screened for diseases.  Follow your health care provider's instructions on monitoring your cholesterol and blood pressure. This information is not intended to replace advice given to you by your health care provider. Make sure you discuss any questions you have with your health care provider. Document Revised: 11/23/2018 Document Reviewed: 11/23/2018 Elsevier Patient Education  2021 [place] [employer]. Caleb Prigmore M.D.

## 2020-12-25 ENCOUNTER — Encounter: Payer: Self-pay | Admitting: Internal Medicine

## 2020-12-25 ENCOUNTER — Ambulatory Visit (INDEPENDENT_AMBULATORY_CARE_PROVIDER_SITE_OTHER): Payer: Managed Care, Other (non HMO) | Admitting: Internal Medicine

## 2020-12-25 ENCOUNTER — Other Ambulatory Visit: Payer: Self-pay

## 2020-12-25 VITALS — BP 142/82 | HR 97 | Temp 97.8°F | Ht 62.0 in | Wt 270.6 lb

## 2020-12-25 DIAGNOSIS — I69351 Hemiplegia and hemiparesis following cerebral infarction affecting right dominant side: Secondary | ICD-10-CM

## 2020-12-25 DIAGNOSIS — G4733 Obstructive sleep apnea (adult) (pediatric): Secondary | ICD-10-CM

## 2020-12-25 DIAGNOSIS — Z0000 Encounter for general adult medical examination without abnormal findings: Secondary | ICD-10-CM

## 2020-12-25 DIAGNOSIS — E785 Hyperlipidemia, unspecified: Secondary | ICD-10-CM

## 2020-12-25 DIAGNOSIS — R635 Abnormal weight gain: Secondary | ICD-10-CM

## 2020-12-25 DIAGNOSIS — G8191 Hemiplegia, unspecified affecting right dominant side: Secondary | ICD-10-CM | POA: Diagnosis not present

## 2020-12-25 DIAGNOSIS — Z79899 Other long term (current) drug therapy: Secondary | ICD-10-CM | POA: Diagnosis not present

## 2020-12-25 DIAGNOSIS — R4586 Emotional lability: Secondary | ICD-10-CM

## 2020-12-25 LAB — BASIC METABOLIC PANEL
BUN: 16 mg/dL (ref 6–23)
CO2: 28 mEq/L (ref 19–32)
Calcium: 9.4 mg/dL (ref 8.4–10.5)
Chloride: 103 mEq/L (ref 96–112)
Creatinine, Ser: 0.5 mg/dL (ref 0.40–1.20)
GFR: 124.73 mL/min (ref >60.00)
Glucose, Bld: 101 mg/dL — ABNORMAL HIGH (ref 70–99)
Potassium: 4.2 mEq/L (ref 3.5–5.1)
Sodium: 137 mEq/L (ref 135–145)

## 2020-12-25 LAB — CBC WITH DIFFERENTIAL/PLATELET
Basophils Absolute: 0.1 10*3/uL (ref 0.0–0.1)
Basophils Relative: 0.7 % (ref 0.0–3.0)
Eosinophils Absolute: 0.4 10*3/uL (ref 0.0–0.7)
Eosinophils Relative: 4.3 % (ref 0.0–5.0)
HCT: 41.5 % (ref 36.0–46.0)
Hemoglobin: 14.2 g/dL (ref 12.0–15.0)
Lymphocytes Relative: 26 % (ref 12.0–46.0)
Lymphs Abs: 2.4 10*3/uL (ref 0.7–4.0)
MCHC: 34.3 g/dL (ref 30.0–36.0)
MCV: 84.4 fl (ref 78.0–100.0)
Monocytes Absolute: 0.4 10*3/uL (ref 0.1–1.0)
Monocytes Relative: 4.8 % (ref 3.0–12.0)
Neutro Abs: 5.9 10*3/uL (ref 1.4–7.7)
Neutrophils Relative %: 64.2 % (ref 43.0–77.0)
Platelets: 438 10*3/uL — ABNORMAL HIGH (ref 150.0–400.0)
RBC: 4.92 Mil/uL (ref 3.87–5.11)
RDW: 12.8 % (ref 11.5–15.5)
WBC: 9.2 10*3/uL (ref 4.0–10.5)

## 2020-12-25 LAB — HEMOGLOBIN A1C: Hgb A1c MFr Bld: 5.4 % (ref 4.6–6.5)

## 2020-12-25 LAB — LIPID PANEL
Cholesterol: 190 mg/dL (ref 0–200)
HDL: 37.8 mg/dL — ABNORMAL LOW (ref >39.00)
LDL Cholesterol: 119 mg/dL — ABNORMAL HIGH (ref 0–99)
NonHDL: 152.03
Total CHOL/HDL Ratio: 5
Triglycerides: 167 mg/dL — ABNORMAL HIGH (ref 0.0–149.0)
VLDL: 33.4 mg/dL (ref 0.0–40.0)

## 2020-12-25 LAB — HEPATIC FUNCTION PANEL
ALT: 25 U/L (ref 0–35)
AST: 16 U/L (ref 0–37)
Albumin: 4.5 g/dL (ref 3.5–5.2)
Alkaline Phosphatase: 68 U/L (ref 39–117)
Bilirubin, Direct: 0.2 mg/dL (ref 0.0–0.3)
Total Bilirubin: 1.4 mg/dL — ABNORMAL HIGH (ref 0.2–1.2)
Total Protein: 6.8 g/dL (ref 6.0–8.3)

## 2020-12-25 LAB — TSH: TSH: 1.12 u[IU]/mL (ref 0.35–4.50)

## 2020-12-25 LAB — T4, FREE: Free T4: 0.75 ng/dL (ref 0.60–1.60)

## 2020-12-25 NOTE — Patient Instructions (Signed)
Will notify you  of labs when available.  Make a plan and decide   If you want to try weight management.   Consider seeing counselor  .   As we discussed .   Will notify you  of labs when available.    Plan follow up depending    Or 4-6 months    Health Maintenance, Female Adopting a healthy lifestyle and getting preventive care are important in promoting health and wellness. Ask your health care provider about:  The right schedule for you to have regular tests and exams.  Things you can do on your own to prevent diseases and keep yourself healthy. What should I know about diet, weight, and exercise? Eat a healthy diet  Eat a diet that includes plenty of vegetables, fruits, low-fat dairy products, and lean protein.  Do not eat a lot of foods that are high in solid fats, added sugars, or sodium.   Maintain a healthy weight Body mass index (BMI) is used to identify weight problems. It estimates body fat based on height and weight. Your health care provider can help determine your BMI and help you achieve or maintain a healthy weight. Get regular exercise Get regular exercise. This is one of the most important things you can do for your health. Most adults should:  Exercise for at least 150 minutes each week. The exercise should increase your heart rate and make you sweat (moderate-intensity exercise).  Do strengthening exercises at least twice a week. This is in addition to the moderate-intensity exercise.  Spend less time sitting. Even light physical activity can be beneficial. Watch cholesterol and blood lipids Have your blood tested for lipids and cholesterol at 32 years of age, then have this test every 5 years. Have your cholesterol levels checked more often if:  Your lipid or cholesterol levels are high.  You are older than 32 years of age.  You are at high risk for heart disease. What should I know about cancer screening? Depending on your health history and family  history, you may need to have cancer screening at various ages. This may include screening for:  Breast cancer.  Cervical cancer.  Colorectal cancer.  Skin cancer.  Lung cancer. What should I know about heart disease, diabetes, and high blood pressure? Blood pressure and heart disease  High blood pressure causes heart disease and increases the risk of stroke. This is more likely to develop in people who have high blood pressure readings, are of African descent, or are overweight.  Have your blood pressure checked: ? Every 3-5 years if you are 62-33 years of age. ? Every year if you are 73 years old or older. Diabetes Have regular diabetes screenings. This checks your fasting blood sugar level. Have the screening done:  Once every three years after age 5 if you are at a normal weight and have a low risk for diabetes.  More often and at a younger age if you are overweight or have a high risk for diabetes. What should I know about preventing infection? Hepatitis B If you have a higher risk for hepatitis B, you should be screened for this virus. Talk with your health care provider to find out if you are at risk for hepatitis B infection. Hepatitis C Testing is recommended for:  Everyone born from [birth year] through 1965.  Anyone with known risk factors for hepatitis C. Sexually transmitted infections (STIs)  Get screened for STIs, including gonorrhea and chlamydia, if: ? You are  sexually active and are younger than 32 years of age. ? You are older than 32 years of age and your health care provider tells you that you are at risk for this type of infection. ? Your sexual activity has changed since you were last screened, and you are at increased risk for chlamydia or gonorrhea. Ask your health care provider if you are at risk.  Ask your health care provider about whether you are at high risk for HIV. Your health care provider may recommend a prescription medicine to help prevent HIV  infection. If you choose to take medicine to prevent HIV, you should first get tested for HIV. You should then be tested every 3 months for as long as you are taking the medicine. Pregnancy  If you are about to stop having your period (premenopausal) and you may become pregnant, seek counseling before you get pregnant.  Take 400 to 800 micrograms (mcg) of folic acid every day if you become pregnant.  Ask for birth control (contraception) if you want to prevent pregnancy. Osteoporosis and menopause Osteoporosis is a disease in which the bones lose minerals and strength with aging. This can result in bone fractures. If you are 31 years old or older, or if you are at risk for osteoporosis and fractures, ask your health care provider if you should:  Be screened for bone loss.  Take a calcium or vitamin D supplement to lower your risk of fractures.  Be given hormone replacement therapy (HRT) to treat symptoms of menopause. Follow these instructions at home: Lifestyle  Do not use any products that contain nicotine or tobacco, such as cigarettes, e-cigarettes, and chewing tobacco. If you need help quitting, ask your health care provider.  Do not use street drugs.  Do not share needles.  Ask your health care provider for help if you need support or information about quitting drugs. Alcohol use  Do not drink alcohol if: ? Your health care provider tells you not to drink. ? You are pregnant, may be pregnant, or are planning to become pregnant.  If you drink alcohol: ? Limit how much you use to 0-1 drink a day. ? Limit intake if you are breastfeeding.  Be aware of how much alcohol is in your drink. In the [place] one drink equals one 12 oz bottle of beer (355 mL), one 5 oz glass of wine (148 mL), or one 1 oz glass of hard liquor (44 mL). General instructions  Schedule regular health, dental, and eye exams.  Stay current with your vaccines.  Tell your health care provider if: ? You  often feel depressed. ? You have ever been abused or do not feel safe at home. Summary  Adopting a healthy lifestyle and getting preventive care are important in promoting health and wellness.  Follow your health care provider's instructions about healthy diet, exercising, and getting tested or screened for diseases.  Follow your health care provider's instructions on monitoring your cholesterol and blood pressure. This information is not intended to replace advice given to you by your health care provider. Make sure you discuss any questions you have with your health care provider. Document Revised: 11/23/2018 Document Reviewed: 11/23/2018 Elsevier Patient Education  2021 [employer].

## 2020-12-30 NOTE — Progress Notes (Signed)
Results  show  low hdl ( good cholesterol and mild elevation of triglyceries in past)  Blood sugar borderline elevated  keep intensifying  healthy eating and activity weight loss   Want to avoid getting diabetes other medical problems   Can FU at your next visit

## 2021-01-23 ENCOUNTER — Encounter: Payer: Self-pay | Admitting: Adult Health

## 2021-01-23 ENCOUNTER — Other Ambulatory Visit: Payer: Self-pay

## 2021-01-23 ENCOUNTER — Ambulatory Visit: Payer: Managed Care, Other (non HMO) | Admitting: Adult Health

## 2021-01-23 DIAGNOSIS — G4733 Obstructive sleep apnea (adult) (pediatric): Secondary | ICD-10-CM

## 2021-01-23 DIAGNOSIS — E669 Obesity, unspecified: Secondary | ICD-10-CM | POA: Diagnosis not present

## 2021-01-23 NOTE — Patient Instructions (Addendum)
Keep up great work.  Continue on CPAP At bedtime   Work on healthy weight loss.  Do not drive if sleepy  Follow up with Dr. Elsworth Soho  In 6 months and As needed

## 2021-01-23 NOTE — Progress Notes (Signed)
'@Patient'  ID: Tina Mckee, female    DOB: 1991/07/18, 30 y.o.   MRN: [id number]  Chief Complaint  Patient presents with  . Follow-up  . Sleep Apnea    Referring provider: Burnis Medin, MD  HPI: 30 year-old female seen for sleep consult June 03, 2020 for daytime sleepiness found to have mild obstructive sleep apnea on home sleep study Right-sided hemiparesis  TEST/EVENTS :  HST showed overall mild OSA with AHI 13/ hr However, events were severe during supine sleep  01/23/2021 Follow up : OSA  30 year-old female seen for follow up for OSA .  Patient was seen in June 2021 for daytime sleepiness found to have mild obstructive sleep apnea on home sleep study.  Home sleep study showed AHI 13/hour.  However she did have severe sleep apnea during supine sleep.  Patient was recommend to begin on CPAP.  Patient says she had a delay in getting her CPAP due to shipment issues.  She is recently started her CPAP last month.  She says she is doing well.  She cannot believe how much better she feels.  She is more rested.  Has no significant daytime sleepiness.  Feels more refreshed.  And also is sleeping throughout the night.  Her CPAP download shows excellent compliance with daily average usage around 8 hours.  AHI is 1.4.  Patient says she is using the Airview app on her phone which is very nice. Patient says she is trying to remain active.  Is trying to walk at least a mile several times a week.  She is trying to lose weight.  Allergies  Allergen Reactions  . Cephalexin Rash and Itching    Immunization History  Administered Date(s) Administered  . DTP 06/22/1989, 09/02/1989, 10/26/1989, 01/04/1991, 05/11/1994  . Hepatitis B 08/31/2000, 10/05/2000, 02/22/2001  . HiB (PRP-OMP) 10/26/1989, 05/28/1990, 07/22/1990  . Influenza Split 09/21/2011, 09/27/2012  . Influenza,inj,Quad PF,6+ Mos 09/29/2013, 09/25/2014, 12/19/2015, 10/13/2016, 10/15/2017  . Influenza-Unspecified 10/15/2017, 10/19/2018,  11/08/2019, 10/19/2020  . MMR 07/22/1990, 05/11/1994  . Moderna Sars-Covid-2 Vaccination 02/10/2020, 03/09/2020, 10/19/2020  . OPV 06/22/1989, 09/02/1989, 01/04/1991, 05/11/1994  . Td 07/15/2001  . Tdap 08/03/2011, 11/14/2014    Past Medical History:  Diagnosis Date  . Asthma   . Gestational hypertension   . Perinatal arterial ischemic stroke ([place])   . Scoliosis   . Stroke [facility]) birth   rt hemiparesis  Perinatal  . Supervision of other normal pregnancy 05/04/2014    Tobacco History: Social History   Tobacco Use  Smoking Status Former Smoker  . Types: Cigarettes  . Quit date: 07/11/2007  . Years since quitting: 13.5  Smokeless Tobacco Never Used   Counseling given: Not Answered   Outpatient Medications Prior to Visit  Medication Sig Dispense Refill  . citalopram (CELEXA) 10 MG tablet Take 1 tablet (10 mg total) by mouth daily. 90 tablet 1  . ketoconazole (NIZORAL) 2 % shampoo ketoconazole 2 % shampoo  APPLY TO THE SCALP 3 TIMES WEEKLY APPLY TO DAMP SCALP, LATHER, LET SIT 5 MINUTES AND RINSE 120 mL 3  . Multiple Vitamins-Calcium (ONE-A-DAY WOMENS PO) Take 1 tablet by mouth daily.    Marland Kitchen omeprazole (PRILOSEC) 20 MG capsule TAKE 1 CAPSULE BY MOUTH EVERY DAY 90 capsule 2  . PARAGARD INTRAUTERINE COPPER IU 1 application by Intrauterine route once.     Marland Kitchen PROAIR HFA 108 (90 Base) MCG/ACT inhaler INHALE 1-2 PUFFS INTO THE LUNGS EVERY 6 (SIX) HOURS AS NEEDED FOR WHEEZING OR SHORTNESS OF BREATH. (Patient  taking differently: Inhale 1-2 puffs into the lungs every 6 (six) hours as needed for wheezing or shortness of breath.) 8.5 Inhaler 0  . sulfamethoxazole-trimethoprim (BACTRIM DS) 800-160 MG tablet Take 1 tablet by mouth 2 (two) times daily. (Patient not taking: No sig reported) 10 tablet 0   Facility-Administered Medications Prior to Visit  Medication Dose Route Frequency Provider Last Rate Last Admin  . ondansetron (ZOFRAN) injection 4 mg  4 mg Intramuscular Q8H PRN Panosh, Standley Brooking,  MD   4 mg at 01/11/19 1530     Review of Systems:   Constitutional:   No  weight loss, night sweats,  Fevers, chills, fatigue, or  lassitude.  HEENT:   No headaches,  Difficulty swallowing,  Tooth/dental problems, or  Sore throat,                No sneezing, itching, ear ache, nasal congestion, post nasal drip,   CV:  No chest pain,  Orthopnea, PND, swelling in lower extremities, anasarca, dizziness, palpitations, syncope.   GI  No heartburn, indigestion, abdominal pain, nausea, vomiting, diarrhea, change in bowel habits, loss of appetite, bloody stools.   Resp: No shortness of breath with exertion or at rest.  No excess mucus, no productive cough,  No non-productive cough,  No coughing up of blood.  No change in color of mucus.  No wheezing.  No chest wall deformity  Skin: no rash or lesions.  GU: no dysuria, change in color of urine, no urgency or frequency.  No flank pain, no hematuria   MS:  No joint pain or swelling.  No decreased range of motion.  No back pain.    Physical Exam  BP 124/72 (BP Location: Left Arm, Cuff Size: Large)   Pulse 66   Temp 97.6 F (36.4 C) (Temporal)   Ht '5\' 1"'  (1.549 m)   Wt 265 lb 6.4 oz (120.4 kg)   SpO2 98%   BMI 50.15 kg/m   GEN: A/Ox3; pleasant , NAD, BMI 50   HEENT:  [place]/AT,   NOSE-clear, THROAT-clear, no lesions, no postnasal drip or exudate noted.  Class II-III MP  airway  NECK:  Supple w/ fair ROM; no JVD; normal carotid impulses w/o bruits; no thyromegaly or nodules palpated; no lymphadenopathy.    RESP  Clear  P & A; w/o, wheezes/ rales/ or rhonchi. no accessory muscle use, no dullness to percussion  CARD:  RRR, no m/r/g, no peripheral edema, pulses intact, no cyanosis or clubbing.  GI:   Soft & nt; nml bowel sounds; no organomegaly or masses detected.   Musco: Warm bil, no deformities or joint swelling noted.   Neuro: alert, right-sided hemiparesis  Skin: Warm, no lesions or rashes      BNP No results found for:  BNP  ProBNP No results found for: PROBNP  Imaging: No results found.    PFT Results Latest Ref Rng & Units 10/25/2017  FVC-Pre L 3.78  FVC-Predicted Pre % 106  FVC-Post L 3.65  FVC-Predicted Post % 103  Pre FEV1/FVC % % 81  Post FEV1/FCV % % 82  FEV1-Pre L 3.05  FEV1-Predicted Pre % 100  FEV1-Post L 3.00  DLCO uncorrected ml/min/mmHg 26.80  DLCO UNC% % 124  DLCO corrected ml/min/mmHg 25.12  DLCO COR %Predicted % 116  DLVA Predicted % 115  TLC L 5.17  TLC % Predicted % 108  RV % Predicted % 121    No results found for: NITRICOXIDE      Assessment &  Plan:   OSA (obstructive sleep apnea) Mild obstructive sleep apnea -excellent control and compliance on nocturnal CPAP.  Patient has significant clinical improvement with decreased symptom burden since starting CPAP. Patient education on healthy sleep regimen.  CPAP care and sleep apnea.  Plan  Patient Instructions  Keep up great work.  Continue on CPAP At bedtime   Work on healthy weight loss.  Do not drive if sleepy  Follow up with Dr. Elsworth Soho  In 6 months and As needed         Obesity (BMI 30-39.9) BMI 50.  Patient education on healthy weight loss     Rexene Edison, NP 01/23/2021

## 2021-01-23 NOTE — Assessment & Plan Note (Signed)
Mild obstructive sleep apnea -excellent control and compliance on nocturnal CPAP.  Patient has significant clinical improvement with decreased symptom burden since starting CPAP. Patient education on healthy sleep regimen.  CPAP care and sleep apnea.  Plan  Patient Instructions  Keep up great work.  Continue on CPAP At bedtime   Work on healthy weight loss.  Do not drive if sleepy  Follow up with Dr. Elsworth Soho  In 6 months and As needed

## 2021-01-23 NOTE — Assessment & Plan Note (Signed)
BMI 50.  Patient education on healthy weight loss

## 2021-02-07 ENCOUNTER — Encounter: Payer: Self-pay | Admitting: Internal Medicine

## 2021-02-22 ENCOUNTER — Emergency Department (HOSPITAL_COMMUNITY): Payer: Managed Care, Other (non HMO)

## 2021-02-22 ENCOUNTER — Emergency Department (HOSPITAL_COMMUNITY)
Admission: EM | Admit: 2021-02-22 | Discharge: 2021-02-22 | Disposition: A | Discharge status: Home / Self Care | Payer: Managed Care, Other (non HMO) | Attending: Emergency Medicine | Admitting: Emergency Medicine

## 2021-02-22 ENCOUNTER — Other Ambulatory Visit: Payer: Self-pay

## 2021-02-22 ENCOUNTER — Encounter (HOSPITAL_COMMUNITY): Payer: Self-pay | Admitting: Emergency Medicine

## 2021-02-22 DIAGNOSIS — J45909 Unspecified asthma, uncomplicated: Secondary | ICD-10-CM | POA: Insufficient documentation

## 2021-02-22 DIAGNOSIS — Z87891 Personal history of nicotine dependence: Secondary | ICD-10-CM | POA: Insufficient documentation

## 2021-02-22 DIAGNOSIS — R109 Unspecified abdominal pain: Secondary | ICD-10-CM | POA: Diagnosis present

## 2021-02-22 DIAGNOSIS — N3001 Acute cystitis with hematuria: Secondary | ICD-10-CM | POA: Insufficient documentation

## 2021-02-22 LAB — URINALYSIS, ROUTINE W REFLEX MICROSCOPIC
Bilirubin Urine: NEGATIVE
Glucose, UA: NEGATIVE mg/dL
Ketones, ur: NEGATIVE mg/dL
Nitrite: NEGATIVE
Protein, ur: 30 mg/dL — AB
RBC / HPF: >50 RBC/hpf — ABNORMAL HIGH (ref 0–5)
Specific Gravity, Urine: 1.014 (ref 1.005–1.030)
WBC, UA: >50 WBC/hpf — ABNORMAL HIGH (ref 0–5)
pH: 5 (ref 5.0–8.0)

## 2021-02-22 LAB — COMPREHENSIVE METABOLIC PANEL
ALT: 31 U/L (ref 0–44)
AST: 20 U/L (ref 15–41)
Albumin: 3.9 g/dL (ref 3.5–5.0)
Alkaline Phosphatase: 64 U/L (ref 38–126)
Anion gap: 6 (ref 5–15)
BUN: 14 mg/dL (ref 6–20)
CO2: 25 mmol/L (ref 22–32)
Calcium: 9.2 mg/dL (ref 8.9–10.3)
Chloride: 108 mmol/L (ref 98–111)
Creatinine, Ser: 0.54 mg/dL (ref 0.44–1.00)
GFR, Estimated: >60 mL/min (ref >60)
Glucose, Bld: 126 mg/dL — ABNORMAL HIGH (ref 70–99)
Potassium: 4 mmol/L (ref 3.5–5.1)
Sodium: 139 mmol/L (ref 135–145)
Total Bilirubin: 1.4 mg/dL — ABNORMAL HIGH (ref 0.3–1.2)
Total Protein: 6.7 g/dL (ref 6.5–8.1)

## 2021-02-22 LAB — CBC
HCT: 42.7 % (ref 36.0–46.0)
Hemoglobin: 14 g/dL (ref 12.0–15.0)
MCH: 28.8 pg (ref 26.0–34.0)
MCHC: 32.8 g/dL (ref 30.0–36.0)
MCV: 87.9 fL (ref 80.0–100.0)
Platelets: 449 10*3/uL — ABNORMAL HIGH (ref 150–400)
RBC: 4.86 MIL/uL (ref 3.87–5.11)
RDW: 12.2 % (ref 11.5–15.5)
WBC: 12 10*3/uL — ABNORMAL HIGH (ref 4.0–10.5)
nRBC: 0 % (ref 0.0–0.2)

## 2021-02-22 LAB — I-STAT BETA HCG BLOOD, ED (MC, WL, AP ONLY): I-stat hCG, quantitative: <5 m[IU]/mL (ref <5)

## 2021-02-22 LAB — LIPASE, BLOOD: Lipase: 32 U/L (ref 11–51)

## 2021-02-22 MED ORDER — SULFAMETHOXAZOLE-TRIMETHOPRIM 800-160 MG PO TABS: 1.0000 | ORAL_TABLET | Freq: Two times a day (BID) | ORAL | 0 refills | Status: AC

## 2021-02-22 MED ORDER — MORPHINE SULFATE (PF) 4 MG/ML IV SOLN
4.0000 mg | Freq: Once | INTRAVENOUS | Status: AC
  Administered 2021-02-22: 4 mg via INTRAVENOUS
  Filled 2021-02-22: qty 1

## 2021-02-22 MED ORDER — IBUPROFEN 600 MG PO TABS: 600.0000 mg | ORAL_TABLET | Freq: Four times a day (QID) | ORAL | 0 refills | Status: DC | PRN

## 2021-02-22 MED ORDER — KETOROLAC TROMETHAMINE 30 MG/ML IJ SOLN
30.0000 mg | Freq: Once | INTRAMUSCULAR | Status: AC
  Administered 2021-02-22: 30 mg via INTRAVENOUS
  Filled 2021-02-22: qty 1

## 2021-02-22 NOTE — ED Notes (Signed)
DC instructions reviewed with pt. PT verbalized understanding. Pt DC °

## 2021-02-22 NOTE — Discharge Instructions (Signed)
You are diagnosed with a urinary tract infection.  Your CT was unremarkable and there were no findings concerning for kidney infection, obstructing kidney stone, or other emergent pathology.  Please take the Bactrim, as directed.  I would also like for you to take ibuprofen 600 mg 6 hours as needed for pain symptoms.    A urine culture has been obtained and is pending.  Please follow-up with your primary care provider first thing next week for ongoing evaluation and management.  I suspect that your symptoms will begin to improve.  Return to the ED or seek immediate medical attention should you experience any new or worsening symptoms.

## 2021-02-22 NOTE — ED Provider Notes (Signed)
[facility] EMERGENCY DEPARTMENT Provider Note   CSN: [id number] Arrival date & time: 02/22/21  5809     History Chief Complaint  Patient presents with  . Abdominal Pain    Jadyn Brasher is a 30 y.o. female with past medical history of perinatal stroke with residual right-sided hemiparesis who presents the ED with complaints of left flank pain.  On my examination, patient reports that she went to bed the last evening at 11 PM and was feeling relatively fine, but then woke up at 1 AM with excruciating 10 out of 10 pain that has been waxing and waning.  She states that it radiates from her left flank into her left side of abdomen.  Patient has never experienced pain similar to this in the past.  She denies any history of kidney stones, but states that her dad has them.  She is uncomfortable on my examination.  She states that her last bowel movement was yesterday, mildly loose.  She denies any pelvic pain, vaginal bleeding or discharge, dysuria or other urinary symptoms, chest pain or difficulty breathing, fevers or chills, hematochezia or melena, nausea or vomiting, or other symptoms.  Patient got a ride to the hospital.  HPI     Past Medical History:  Diagnosis Date  . Asthma   . Gestational hypertension   . Perinatal arterial ischemic stroke ([place])   . Scoliosis   . Stroke [facility]) birth   rt hemiparesis  Perinatal  . Supervision of other normal pregnancy 05/04/2014    Patient Active Problem List   Diagnosis Date Noted  . OSA (obstructive sleep apnea) 06/03/2020  . Insomnia 06/03/2020  . Hemiparesis affecting right side as late effect of cerebrovascular accident ([place]) 02/06/2016  . Pigmented skin lesion of uncertain nature 98/33/8250  . Hyperlipidemia 02/20/2015  . Visit for preventive health examination 02/20/2015  . Hx of fall 02/20/2015  . Encounter for IUD insertion 02/12/2015  . Cystic 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, antepartum 11/14/2014  . Rubella non-immune  status, antepartum 07/31/2014  . History of stroke 05/04/2014  . Obesity (BMI 30-39.9) 09/29/2013  . Thrombocytosis 10/01/2012  . High blood triglycerides 09/27/2012  . Abnormal LFTs 09/27/2012  . Weight gain 09/27/2012  . Shaking 04/22/2012  . Swelling 04/22/2012  . Hyperglycemia 09/23/2011  . Abnormal glucose 09/21/2011  . Falling episodes 09/21/2011  . Cervical radicular pain 08/03/2011  . Wrist pain 02/16/2011  . Hemiparesis, right ([place]) 02/16/2011  . SCOLIOSIS 01/16/2009    Past Surgical History:  Procedure Laterality Date  . FOOT SURGERY     rt foot  . FOOT SURGERY     Bone placed  . moles removed     from back     OB History    Gravida  2   Para  2   Term  2   Preterm  0   AB      Living  2     SAB      IAB      Ectopic      Multiple  0   Live Births  2           Family History  Problem Relation Age of Onset  . Hypertension Mother   . Asthma Mother   . Hypertension Father   . Sleep apnea Father   . Asthma Sister   . Cancer Maternal Grandfather        pancreatic  . Cancer Paternal Grandmother  cancer all over  . Other Paternal Grandfather        brain damage  . Other Maternal Grandmother        heart problems    Social History   Tobacco Use  . Smoking status: Former Smoker    Types: Cigarettes    Quit date: 07/11/2007    Years since quitting: 13.6  . Smokeless tobacco: Never Used  Vaping Use  . Vaping Use: Never used  Substance Use Topics  . Alcohol use: Yes    Alcohol/week: 1.0 standard drink    Types: 1 Standard drinks or equivalent per week    Comment: Every other day  . Drug use: No    Home Medications Prior to Admission medications   Medication Sig Start Date End Date Taking? Authorizing Provider  ibuprofen (ADVIL) 600 MG tablet Take 1 tablet (600 mg total) by mouth every 6 (six) hours as needed. 02/22/21  Yes Corena Herter, PA-C  sulfamethoxazole-trimethoprim (BACTRIM DS) 800-160 MG tablet Take 1 tablet  by mouth 2 (two) times daily for 5 days. 02/22/21 02/27/21 Yes Corena Herter, PA-C  citalopram (CELEXA) 10 MG tablet Take 1 tablet (10 mg total) by mouth daily. 10/28/20   Panosh, Standley Brooking, MD  ketoconazole (NIZORAL) 2 % shampoo ketoconazole 2 % shampoo  APPLY TO THE SCALP 3 TIMES WEEKLY APPLY TO DAMP SCALP, LATHER, LET SIT 5 MINUTES AND RINSE 09/17/20   Panosh, Standley Brooking, MD  Multiple Vitamins-Calcium (ONE-A-DAY WOMENS PO) Take 1 tablet by mouth daily.    [provider]  omeprazole (PRILOSEC) 20 MG capsule TAKE 1 CAPSULE BY MOUTH EVERY DAY 04/16/20   Panosh, Standley Brooking, MD  PARAGARD INTRAUTERINE COPPER IU 1 application by Intrauterine route once.     [provider]  PROAIR HFA 108 (90 Base) MCG/ACT inhaler INHALE 1-2 PUFFS INTO THE LUNGS EVERY 6 (SIX) HOURS AS NEEDED FOR WHEEZING OR SHORTNESS OF BREATH. Patient taking differently: Inhale 1-2 puffs into the lungs every 6 (six) hours as needed for wheezing or shortness of breath. 04/06/19   Panosh, Standley Brooking, MD    Allergies    Cephalexin  Review of Systems   Review of Systems  All other systems reviewed and are negative.   Physical Exam Updated Vital Signs BP 126/88 (BP Location: Left Arm)   Pulse 73   Temp 98.1 F (36.7 C) (Oral)   Resp 16   SpO2 100%   Physical Exam Vitals and nursing note reviewed. Exam conducted with a chaperone present.  Constitutional:      Comments: In discomfort  HENT:     Head: Normocephalic and atraumatic.  Eyes:     General: No scleral icterus.    Conjunctiva/sclera: Conjunctivae normal.  Cardiovascular:     Rate and Rhythm: Normal rate.     Pulses: Normal pulses.  Pulmonary:     Effort: Pulmonary effort is normal. No respiratory distress.  Abdominal:     General: Abdomen is flat. There is no distension.     Palpations: Abdomen is soft.     Tenderness: There is abdominal tenderness. There is no right CVA tenderness, left CVA tenderness or guarding.     Comments: Soft, nondistended.   Mild TTP over left side of abdomen.  No guarding.  No overlying skin changes.  Negative CVAT bilaterally.  Musculoskeletal:     Cervical back: Normal range of motion.  Skin:    General: Skin is dry.  Neurological:     Mental Status:  She is alert and oriented to person, place, and time.     GCS: GCS eye subscore is 4. GCS verbal subscore is 5. GCS motor subscore is 6.  Psychiatric:        Mood and Affect: Mood normal.        Behavior: Behavior normal.        Thought Content: Thought content normal.     ED Results / Procedures / Treatments   Labs (all labs ordered are listed, but only abnormal results are displayed) Labs Reviewed  COMPREHENSIVE METABOLIC PANEL - Abnormal; Notable for the following components:      Result Value   Glucose, Bld 126 (*)    Total Bilirubin 1.4 (*)    All other components within normal limits  CBC - Abnormal; Notable for the following components:   WBC 12.0 (*)    Platelets 449 (*)    All other components within normal limits  URINALYSIS, ROUTINE W REFLEX MICROSCOPIC - Abnormal; Notable for the following components:   APPearance CLOUDY (*)    Hgb urine dipstick LARGE (*)    Protein, ur 30 (*)    Leukocytes,Ua LARGE (*)    RBC / HPF >50 (*)    WBC, UA >50 (*)    Bacteria, UA RARE (*)    All other components within normal limits  URINE CULTURE  LIPASE, BLOOD  I-STAT BETA HCG BLOOD, ED ([facility], [facility], [facility] ONLY)    EKG None  Radiology CT Renal Stone Study  Result Date: 02/22/2021 CLINICAL DATA:  Left-sided flank pain. EXAM: CT ABDOMEN AND PELVIS WITHOUT CONTRAST TECHNIQUE: Multidetector CT imaging of the abdomen and pelvis was performed following the standard protocol without IV contrast. COMPARISON:  None. FINDINGS: Lower chest: No acute abnormality. Hepatobiliary: No focal liver abnormality is seen. No gallstones, gallbladder wall thickening, or biliary dilatation. Pancreas: Unremarkable. No pancreatic ductal dilatation or surrounding inflammatory  changes. Spleen: Normal in size without focal abnormality. Adrenals/Urinary Tract: Adrenal glands are unremarkable. Kidneys are normal, without renal calculi, focal lesion, or hydronephrosis. Bladder is unremarkable. Stomach/Bowel: Stomach is within normal limits. Appendix appears normal. No evidence of bowel wall thickening, distention, or inflammatory changes. Vascular/Lymphatic: No significant vascular findings are present. No enlarged abdominal or pelvic lymph nodes. Reproductive: An IUD is identified.  No other abnormalities. Other: There is a fat containing umbilical hernia. No other abnormalities. Musculoskeletal: No acute or significant osseous findings. IMPRESSION: 1. No cause for the patient's symptoms identified. No renal stones or obstruction are noted on this study. Electronically Signed   By: Dorise Bullion III M.D   On: 02/22/2021 09:52    Procedures Procedures   Medications Ordered in ED Medications  morphine 4 MG/ML injection 4 mg (4 mg Intravenous Given 02/22/21 0830)  ketorolac (TORADOL) 30 MG/ML injection 30 mg (30 mg Intravenous Given 02/22/21 1014)    ED Course  I have reviewed the triage vital signs and the nursing notes.  Pertinent labs & imaging results that were available during my care of the patient were reviewed by me and considered in my medical decision making (see chart for details).    MDM Rules/Calculators/A&P                          Lashonta Pilling was evaluated in Emergency Department on 02/22/2021 for the symptoms described in the history of present illness. She was evaluated in the context of the global COVID-19 pandemic, which necessitated consideration that the patient  might be at risk for infection with the SARS-CoV-2 virus that causes COVID-19. Institutional protocols and algorithms that pertain to the evaluation of patients at risk for COVID-19 are in a state of rapid change based on information released by regulatory bodies including the CDC and federal  and state organizations. These policies and algorithms were followed during the patient's care in the ED.  I personally reviewed patient's medical chart and all notes from triage and staff during today's encounter. I have also ordered and reviewed all labs and imaging that I felt to be medically necessary in the evaluation of this patient's complaints and with consideration of their physical exam. If needed, translation services were available and utilized.    Patient's history and physical exam most concerning for obstructive ureterolithiasis.  Differential also includes but not limited to colitis, diverticulitis, pyelonephritis, or pelvic pathology.  Patient denies any vaginal bleeding, discharge, dyspareunia, or concern for STI.  States that she has been in a committed relationship with her husband for 11 years.  Doubt PID.  We will proceed with renal stone study.  On subsequent evaluation, patient is feeling mildly improved after morphine administration.  She does state that her father recently was discharged from the hospital after he broke his foot and yesterday she had to essentially carry him into the house.  Cannot exclude musculoskeletal injury, however her symptoms do not appear to be readily reproducible with movements.  Laboratory work-up is notable for UTI.  Her urine is suggestive of infection, will treat with Bactrim x5 days given that she has a Keflex allergy.  She has a primary care provider with whom she can follow-up.  We will give her a dose of Toradol IV and then reassess.  Plan to prescribe continued NSAIDs and muscle relaxants as needed for her flank discomfort.  Her CBC notable for only mild leukocytosis to 12.0.  Relatively consistent with her baseline.  CMP unremarkable, lipase within normal limits.  Urine culture is obtained and pending.  On final examination, after Toradol, she is pain-free.  ED return precautions discussed.  Patient voices understanding and is agreeable to  the plan.  Final Clinical Impression(s) / ED Diagnoses Final diagnoses:  Acute cystitis with hematuria    Rx / DC Orders ED Discharge Orders         Ordered    ibuprofen (ADVIL) 600 MG tablet  Every 6 hours PRN        02/22/21 1105    sulfamethoxazole-trimethoprim (BACTRIM DS) 800-160 MG tablet  2 times daily        02/22/21 1105           Reita Chard 02/22/21 1105    Charlesetta Shanks, MD 02/22/21 1623

## 2021-02-22 NOTE — ED Triage Notes (Addendum)
C/o L lateral abd pain that radiates to L flank since 1am.  States she is having spasms.  Denies nausea, vomiting, diarrhea, and urinary complaints.

## 2021-02-24 LAB — URINE CULTURE: Culture: >=100000 — AB

## 2021-03-24 ENCOUNTER — Other Ambulatory Visit: Payer: Self-pay | Admitting: Internal Medicine

## 2021-04-13 ENCOUNTER — Other Ambulatory Visit: Payer: Self-pay | Admitting: Internal Medicine

## 2021-04-28 ENCOUNTER — Ambulatory Visit: Payer: Managed Care, Other (non HMO) | Admitting: Internal Medicine

## 2021-05-12 NOTE — Progress Notes (Signed)
Chief Complaint  Patient presents with  . Follow-up    HPI: Tina Mckee 30 y.o. come in for Fu BP elevation , osa weight gain. Since this last visit she has taken action to help weight management and health. Walking with mom   Had to stop to family illness.  One meal and snacks   Portion controlled.  Voiding fried foods and lots of simple carbs and has cut out any alcohol. Drinks lots of water and no calorie beverages cpap using a regular basis feels pretty well and hopeful.  ROS: See pertinent positives and negatives per HPI.  Past Medical History:  Diagnosis Date  . Asthma   . Gestational hypertension   . Perinatal arterial ischemic stroke ([place])   . Scoliosis   . Stroke [facility]) birth   rt hemiparesis  Perinatal  . Supervision of other normal pregnancy 05/04/2014    Family History  Problem Relation Age of Onset  . Hypertension Mother   . Asthma Mother   . Hypertension Father   . Sleep apnea Father   . Asthma Sister   . Cancer Maternal Grandfather        pancreatic  . Cancer Paternal Grandmother        cancer all over  . Other Paternal Grandfather        brain damage  . Other Maternal Grandmother        heart problems    Social History   Socioeconomic History  . Marital status: Married    Spouse name: Not on file  . Number of children: 2  . Years of education: Not on file  . Highest education level: Not on file  Occupational History  . Occupation: Therapist, art  Tobacco Use  . Smoking status: Former Smoker    Types: Cigarettes    Quit date: 07/11/2007    Years since quitting: 13.8  . Smokeless tobacco: Never Used  Vaping Use  . Vaping Use: Never used  Substance and Sexual Activity  . Alcohol use: Yes    Alcohol/week: 1.0 standard drink    Types: 1 Standard drinks or equivalent per week    Comment: Every other day  . Drug use: No  . Sexual activity: Yes    Birth control/protection: I.U.D.  Other Topics Concern  . 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     Currently nonsmoking    HH of 4 no pets     had mild preeclampsia with vaginal childbirth.  2011   cb 12 15 preeclampsi no complicatinos   ocass etoh.    Exercise  Walking     Sleep ok    caffiene limited per day .             Social Determinants of Health   Financial Resource Strain: Not on file  Food Insecurity: Not on file  Transportation Needs: Not on file  Physical Activity: Not on file  Stress: Not on file  Social Connections: Not on file    Outpatient Medications Prior to Visit  Medication Sig Dispense Refill  . citalopram (CELEXA) 10 MG tablet TAKE 1 TABLET BY MOUTH EVERY DAY 90 tablet 0  . ibuprofen (ADVIL) 600 MG tablet Take 1 tablet (600 mg total) by mouth every 6 (six) hours as needed. 30 tablet 0  . ketoconazole (NIZORAL) 2 % shampoo ketoconazole 2 %  shampoo  APPLY TO THE SCALP 3 TIMES WEEKLY APPLY TO DAMP SCALP, LATHER, LET SIT 5 MINUTES AND RINSE 120 mL 3  . Multiple Vitamins-Calcium (ONE-A-DAY WOMENS PO) Take 1 tablet by mouth daily.    Marland Kitchen PARAGARD INTRAUTERINE COPPER IU 1 application by Intrauterine route once.     Marland Kitchen PROAIR HFA 108 (90 Base) MCG/ACT inhaler INHALE 1-2 PUFFS INTO THE LUNGS EVERY 6 (SIX) HOURS AS NEEDED FOR WHEEZING OR SHORTNESS OF BREATH. (Patient taking differently: Inhale 1-2 puffs into the lungs every 6 (six) hours as needed for wheezing or shortness of breath.) 8.5 Inhaler 0  . omeprazole (PRILOSEC) 20 MG capsule TAKE 1 CAPSULE BY MOUTH EVERY DAY (Patient not taking: Reported on 05/13/2021) 90 capsule 1   Facility-Administered Medications Prior to Visit  Medication Dose Route Frequency Provider Last Rate Last Admin  . ondansetron (ZOFRAN) injection 4 mg  4 mg Intramuscular Q8H PRN Willard Farquharson, Standley Brooking, MD   4 mg at 01/11/19 1530     EXAM:  BP 130/80 (BP Location: Left Arm, Patient Position: Sitting, Cuff Size: Large)   Pulse 73   Temp 98.5  F (36.9 C) (Oral)   Ht 5\' 1"  (1.549 m)   Wt 252 lb 3.2 oz (114.4 kg)   SpO2 96%   BMI 47.65 kg/m   Body mass index is 47.65 kg/m. Wt Readings from Last 3 Encounters:  05/13/21 252 lb 3.2 oz (114.4 kg)  01/23/21 265 lb 6.4 oz (120.4 kg)  12/25/20 270 lb 9.6 oz (122.7 kg)    GENERAL: vitals reviewed and listed above, alert, oriented, appears well hydrated and in no acute distress HEENT: atraumatic, conjunctiva  clear, no obvious abnormalities on inspection of external nose and ears OP : masked  NECK: no obvious masses on inspection palpation   MS: Ambulatory with right hemiparesis PSYCH: pleasant and cooperative, mood seems stable and upbeat. Lab Results  Component Value Date   WBC 12.0 (H) 02/22/2021   HGB 14.0 02/22/2021   HCT 42.7 02/22/2021   PLT 449 (H) 02/22/2021   GLUCOSE 126 (H) 02/22/2021   CHOL 190 12/25/2020   TRIG 167.0 (H) 12/25/2020   HDL 37.80 (L) 12/25/2020   LDLDIRECT 131.0 10/19/2018   LDLCALC 119 (H) 12/25/2020   ALT 31 02/22/2021   AST 20 02/22/2021   NA 139 02/22/2021   K 4.0 02/22/2021   CL 108 02/22/2021   CREATININE 0.54 02/22/2021   BUN 14 02/22/2021   CO2 25 02/22/2021   TSH 1.12 12/25/2020   HGBA1C 5.1 05/13/2021   MICROALBUR 0.9 09/20/2012   BP Readings from Last 3 Encounters:  05/13/21 130/80  02/22/21 (!) 118/99  01/23/21 124/72   ASSESSMENT AND PLAN:  Discussed the following assessment and plan:  Hyperglycemia - Plan: POCT glycosylated hemoglobin (Hb A1C)  OSA (obstructive sleep apnea)  Medication management  Morbid obesity ([place]) - hsa lost 18 # since lsi  January  to continue  Has lost 18 pounds since last visit to continue A1c is in a totally normal range now encouraged continuation of lifestyle intervention with continued success and discussion about potential barriers and slowdown. Plan CPX and full set of labs in January 2023 when she is due or earlier if she is having troubles. Continue her CPAP treatment -Patient  advised to return or notify health care team  if  new concerns arise.  Patient Instructions   Continue lifestyle intervention healthy eating and exercise . To help the healthy weight loss.  Can try  Activity tracker sometimes help reminder to   Move more.  BP acceptable today .  Hg a1c is 5.1 normal  (  Blood sugar )  Continue CPAP treatment .  Stay as active as possible.   Wt Readings from Last 3 Encounters:  05/13/21 252 lb 3.2 oz (114.4 kg)  01/23/21 265 lb 6.4 oz (120.4 kg)  12/25/20 270 lb 9.6 oz (122.7 kg)   BP Readings from Last 3 Encounters:  05/13/21 130/80  02/22/21 (!) 118/99  01/23/21 124/72     Keep up the good  Work!    Standley Brooking. Kimari Lienhard M.D.

## 2021-05-13 ENCOUNTER — Encounter: Payer: Self-pay | Admitting: Internal Medicine

## 2021-05-13 ENCOUNTER — Ambulatory Visit: Payer: Managed Care, Other (non HMO) | Admitting: Internal Medicine

## 2021-05-13 ENCOUNTER — Other Ambulatory Visit: Payer: Self-pay

## 2021-05-13 VITALS — BP 130/80 | HR 73 | Temp 98.5°F | Ht 61.0 in | Wt 252.2 lb

## 2021-05-13 DIAGNOSIS — G4733 Obstructive sleep apnea (adult) (pediatric): Secondary | ICD-10-CM | POA: Diagnosis not present

## 2021-05-13 DIAGNOSIS — E669 Obesity, unspecified: Secondary | ICD-10-CM

## 2021-05-13 DIAGNOSIS — I69351 Hemiplegia and hemiparesis following cerebral infarction affecting right dominant side: Secondary | ICD-10-CM

## 2021-05-13 DIAGNOSIS — R739 Hyperglycemia, unspecified: Secondary | ICD-10-CM

## 2021-05-13 DIAGNOSIS — Z79899 Other long term (current) drug therapy: Secondary | ICD-10-CM | POA: Diagnosis not present

## 2021-05-13 DIAGNOSIS — Z0000 Encounter for general adult medical examination without abnormal findings: Secondary | ICD-10-CM

## 2021-05-13 LAB — POCT GLYCOSYLATED HEMOGLOBIN (HGB A1C): Hemoglobin A1C: 5.1 % (ref 4.0–5.6)

## 2021-05-13 NOTE — Patient Instructions (Addendum)
Continue lifestyle intervention healthy eating and exercise . To help the healthy weight loss.  Can try  Activity tracker sometimes help reminder to   Move more.  BP acceptable today .  Hg a1c is 5.1 normal  (  Blood sugar )  Continue CPAP treatment .  Stay as active as possible.   Wt Readings from Last 3 Encounters:  05/13/21 252 lb 3.2 oz (114.4 kg)  01/23/21 265 lb 6.4 oz (120.4 kg)  12/25/20 270 lb 9.6 oz (122.7 kg)   BP Readings from Last 3 Encounters:  05/13/21 130/80  02/22/21 (!) 118/99  01/23/21 124/72     Keep up the good  Work!

## 2021-07-06 ENCOUNTER — Other Ambulatory Visit: Payer: Self-pay | Admitting: Internal Medicine

## 2021-07-24 ENCOUNTER — Other Ambulatory Visit: Payer: Self-pay

## 2021-07-24 ENCOUNTER — Encounter: Payer: Self-pay | Admitting: Adult Health

## 2021-07-24 ENCOUNTER — Ambulatory Visit: Payer: Managed Care, Other (non HMO) | Admitting: Adult Health

## 2021-07-24 DIAGNOSIS — E669 Obesity, unspecified: Secondary | ICD-10-CM

## 2021-07-24 DIAGNOSIS — G4733 Obstructive sleep apnea (adult) (pediatric): Secondary | ICD-10-CM

## 2021-07-24 NOTE — Patient Instructions (Signed)
Keep up great work.  Continue on CPAP At bedtime   Work on healthy weight loss.  Do not drive if sleepy  Follow up with Dr. Elsworth Soho  or Masiah Lewing NP 1 year and As needed

## 2021-07-24 NOTE — Progress Notes (Signed)
_0  ID: Tina Mckee, female    DOB: 15-Oct-1991, 30 y.o.   MRN: [id number]  Chief Complaint  Patient presents with   Follow-up    Referring provider: Burnis Medin, MD  HPI: 30 year old female seen for sleep consult June 03, 2020 for daytime sleepiness found to have mild obstructive sleep apnea Perinatal stroke with residual right-sided hemiparesis  TEST/EVENTS :  HST showed overall mild OSA with AHI 13/ hr However, events were severe during supine sleep  07/24/2021 Follow up : OSA  Patient returns for a 72-monthfollow-up.  Patient was seen in June 2021 for daytime sleepiness found to have mild obstructive sleep apnea.  She was started on nocturnal CPAP.  Patient says since starting CPAP she is feeling much improved with decreased daytime sleepiness.  Feels more rested.  And feels that she benefits from CPAP.  CPAP download shows excellent compliance with 100% usage.  Daily average usage at 8 hours.  Patient is on auto CPAP 5 to 15 cm H2O.  AHI is 1.1. Patient uses a Nasal -N20 .   Having dental issues, has to have root canal .   Working on weight loss. Doing intermittent fasting . Has lost 30lbs.  Went on cruise this summer. Has 6 and 11 year children .    Allergies  Allergen Reactions   Cephalexin Rash and Itching    Immunization History  Administered Date(s) Administered   DTP 06/22/1989, 09/02/1989, 10/26/1989, 01/04/1991, 05/11/1994   Hepatitis B 08/31/2000, 10/05/2000, 02/22/2001   HiB (PRP-OMP) 10/26/1989, 05/28/1990, 07/22/1990   Influenza Split 09/21/2011, 09/27/2012   Influenza,inj,Quad PF,6+ Mos 09/29/2013, 09/25/2014, 12/19/2015, 10/13/2016, 10/15/2017   Influenza-Unspecified 10/15/2017, 10/19/2018, 11/08/2019, 10/19/2020   MMR 07/22/1990, 05/11/1994   Moderna Sars-Covid-2 Vaccination 02/10/2020, 03/09/2020, 10/19/2020   OPV 06/22/1989, 09/02/1989, 01/04/1991, 05/11/1994   Td 07/15/2001   Tdap 08/03/2011, 11/14/2014    Past Medical History:   Diagnosis Date   Asthma    Gestational hypertension    Perinatal arterial ischemic stroke ([place]    Scoliosis    Stroke [place]   rt hemiparesis  Perinatal   Supervision of other normal pregnancy 05/04/2014    Tobacco History: Social History   Tobacco Use  Smoking Status Former   Types: Cigarettes   Quit date: 07/11/2007   Years since quitting: 14.0  Smokeless Tobacco Never   Counseling given: Not Answered   Outpatient Medications Prior to Visit  Medication Sig Dispense Refill   citalopram (CELEXA) 10 MG tablet TAKE 1 TABLET BY MOUTH EVERY DAY 90 tablet 1   ibuprofen (ADVIL) 600 MG tablet Take 1 tablet (600 mg total) by mouth every 6 (six) hours as needed. 30 tablet 0   ketoconazole (NIZORAL) 2 % shampoo ketoconazole 2 % shampoo  APPLY TO THE SCALP 3 TIMES WEEKLY APPLY TO DAMP SCALP, LATHER, LET SIT 5 MINUTES AND RINSE 120 mL 3   Multiple Vitamins-Calcium (ONE-A-DAY WOMENS PO) Take 1 tablet by mouth daily.     PARAGARD INTRAUTERINE COPPER IU 1 application by Intrauterine route once.      PROAIR HFA 108 (90 Base) MCG/ACT inhaler INHALE 1-2 PUFFS INTO THE LUNGS EVERY 6 (SIX) HOURS AS NEEDED FOR WHEEZING OR SHORTNESS OF BREATH. (Patient taking differently: Inhale 1-2 puffs into the lungs every 6 (six) hours as needed for wheezing or shortness of breath.) 8.5 Inhaler 0   omeprazole (PRILOSEC) 20 MG capsule TAKE 1 CAPSULE BY MOUTH EVERY DAY (Patient not taking: Reported on 05/13/2021) 90 capsule 1   Facility-Administered  Medications Prior to Visit  Medication Dose Route Frequency Provider Last Rate Last Admin   ondansetron (ZOFRAN) injection 4 mg  4 mg Intramuscular Q8H PRN Panosh, Standley Brooking, MD   4 mg at 01/11/19 1530     Review of Systems:   Constitutional:   No  weight loss, night sweats,  Fevers, chills, fatigue, or  lassitude.  HEENT:   No headaches,  Difficulty swallowing,  Tooth/dental problems, or  Sore throat,                No sneezing, itching, ear ache, nasal  congestion, post nasal drip,   CV:  No chest pain,  Orthopnea, PND, swelling in lower extremities, anasarca, dizziness, palpitations, syncope.   GI  No heartburn, indigestion, abdominal pain, nausea, vomiting, diarrhea, change in bowel habits, loss of appetite, bloody stools.   Resp: No shortness of breath with exertion or at rest.  No excess mucus, no productive cough,  No non-productive cough,  No coughing up of blood.  No change in color of mucus.  No wheezing.  No chest wall deformity  Skin: no rash or lesions.  GU: no dysuria, change in color of urine, no urgency or frequency.  No flank pain, no hematuria   MS:  No joint pain or swelling.  No decreased range of motion.  No back pain.    Physical Exam  BP 124/80 (BP Location: Left Arm, Patient Position: Sitting, Cuff Size: Large)   Pulse 77   Temp 98 F (36.7 C) (Oral)   Ht 5' 1" (1.549 m)   Wt 244 lb 3.2 oz (110.8 kg)   SpO2 96%   BMI 46.14 kg/m   GEN: A/Ox3; pleasant , NAD, well nourished    HEENT:  [place]/AT,   NOSE-clear, THROAT-clear, no lesions, no postnasal drip or exudate noted.   NECK:  Supple w/ fair ROM; no JVD; normal carotid impulses w/o bruits; no thyromegaly or nodules palpated; no lymphadenopathy.    RESP  Clear  P & A; w/o, wheezes/ rales/ or rhonchi. no accessory muscle use, no dullness to percussion  CARD:  RRR, no m/r/g, no peripheral edema, pulses intact, no cyanosis or clubbing.  GI:   Soft & nt; nml bowel sounds; no organomegaly or masses detected.   Musco: Warm bil, no deformities or joint swelling noted.   Neuro: alert, no focal deficits noted.    Skin: Warm, no lesions or rashes    Lab Results:     BNP No results found for: BNP  ProBNP No results found for: PROBNP  Imaging: No results found.    PFT Results Latest Ref Rng & Units 10/25/2017  FVC-Pre L 3.78  FVC-Predicted Pre % 106  FVC-Post L 3.65  FVC-Predicted Post % 103  Pre FEV1/FVC % % 81  Post FEV1/FCV % % 82   FEV1-Pre L 3.05  FEV1-Predicted Pre % 100  FEV1-Post L 3.00  DLCO uncorrected ml/min/mmHg 26.80  DLCO UNC% % 124  DLCO corrected ml/min/mmHg 25.12  DLCO COR %Predicted % 116  DLVA Predicted % 115  TLC L 5.17  TLC % Predicted % 108  RV % Predicted % 121    No results found for: NITRICOXIDE      Assessment & Plan:   OSA (obstructive sleep apnea) Excellent control compliance on nocturnal CPAP.  Continue on current settings  Plan  Patient Instructions  Keep up great work.  Continue on CPAP At bedtime   Work on healthy weight loss.  Do not  drive if sleepy  Follow up with Dr. Elsworth Soho  or Calogero Geisen NP 1 year and As needed         Obesity (BMI 30-39.9) Healthy weight loss discussed     Rexene Edison, NP 07/24/2021

## 2021-07-24 NOTE — Assessment & Plan Note (Signed)
Healthy weight loss discussed 

## 2021-07-24 NOTE — Assessment & Plan Note (Addendum)
Excellent control compliance on nocturnal CPAP.  Continue on current settings  Plan  Patient Instructions  Keep up great work.  Continue on CPAP At bedtime   Work on healthy weight loss.  Do not drive if sleepy  Follow up with Dr. Elsworth Soho  or Mellody Masri NP 1 year and As needed

## 2021-08-11 ENCOUNTER — Other Ambulatory Visit: Payer: Self-pay

## 2021-08-11 ENCOUNTER — Emergency Department (HOSPITAL_COMMUNITY): Payer: Managed Care, Other (non HMO)

## 2021-08-11 ENCOUNTER — Emergency Department (HOSPITAL_COMMUNITY)
Admission: EM | Admit: 2021-08-11 | Discharge: 2021-08-11 | Disposition: A | Discharge status: Home / Self Care | Payer: Managed Care, Other (non HMO) | Attending: Emergency Medicine | Admitting: Emergency Medicine

## 2021-08-11 ENCOUNTER — Encounter (HOSPITAL_COMMUNITY): Payer: Self-pay | Admitting: Emergency Medicine

## 2021-08-11 DIAGNOSIS — Z87891 Personal history of nicotine dependence: Secondary | ICD-10-CM | POA: Diagnosis not present

## 2021-08-11 DIAGNOSIS — S59901A Unspecified injury of right elbow, initial encounter: Secondary | ICD-10-CM | POA: Diagnosis present

## 2021-08-11 DIAGNOSIS — S53124A Posterior dislocation of right ulnohumeral joint, initial encounter: Secondary | ICD-10-CM

## 2021-08-11 DIAGNOSIS — S53104A Unspecified dislocation of right ulnohumeral joint, initial encounter: Secondary | ICD-10-CM | POA: Insufficient documentation

## 2021-08-11 DIAGNOSIS — J45909 Unspecified asthma, uncomplicated: Secondary | ICD-10-CM | POA: Diagnosis not present

## 2021-08-11 DIAGNOSIS — Y92410 Unspecified street and highway as the place of occurrence of the external cause: Secondary | ICD-10-CM | POA: Diagnosis not present

## 2021-08-11 HISTORY — DX: Sleep apnea, unspecified: G47.30

## 2021-08-11 MED ORDER — PROPOFOL 10 MG/ML IV BOLUS
1.0000 mg/kg | Freq: Once | INTRAVENOUS | Status: AC
  Administered 2021-08-11: 100 mg via INTRAVENOUS
  Filled 2021-08-11: qty 20

## 2021-08-11 MED ORDER — FENTANYL CITRATE PF 50 MCG/ML IJ SOSY
100.0000 ug | PREFILLED_SYRINGE | Freq: Once | INTRAMUSCULAR | Status: AC
  Administered 2021-08-11: 100 ug via INTRAVENOUS
  Filled 2021-08-11: qty 2

## 2021-08-11 MED ORDER — FENTANYL CITRATE PF 50 MCG/ML IJ SOSY
100.0000 ug | PREFILLED_SYRINGE | Freq: Once | INTRAMUSCULAR | Status: AC
  Administered 2021-08-11: 100 ug via INTRAVENOUS

## 2021-08-11 MED ORDER — FENTANYL CITRATE PF 50 MCG/ML IJ SOSY
PREFILLED_SYRINGE | INTRAMUSCULAR | Status: AC
  Filled 2021-08-11: qty 2
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
## 2021-08-11 NOTE — Sedation Documentation (Signed)
$'30mg'h$  proprol administered. '100mg'$  total

## 2021-08-11 NOTE — ED Notes (Signed)
Pt to xray

## 2021-08-11 NOTE — Sedation Documentation (Signed)
$'30mg'Y$  propofol given. '70mg'$  total

## 2021-08-11 NOTE — Sedation Documentation (Signed)
Propofol start '20mg'$  given by Dr. Sabra Heck

## 2021-08-11 NOTE — Discharge Instructions (Addendum)
Please use Tylenol or ibuprofen for pain.  You may use 600 mg ibuprofen every 6 hours or 1000 mg of Tylenol every 6 hours.  You may choose to alternate between the 2.  This would be most effective.  Not to exceed 4 g of Tylenol within 24 hours.  Not to exceed 3200 mg ibuprofen 24 hours.  
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
## 2021-08-11 NOTE — Sedation Documentation (Signed)
Suction set up at bedside, pt placed on O2 via [place] at 2Lpm with ETCO2 monitoring. Cardiac monitoring on. Fluids at bedside

## 2021-08-11 NOTE — Sedation Documentation (Addendum)
Additional 43mg fentanyl given - 100 mcg total

## 2021-08-11 NOTE — ED Notes (Signed)
Pt noted to be 100% on room air. Alert and oriented x4. Pain level 4/10. NSR on cardiac monitor.  Shoulder immobilizer and sling applied.

## 2021-08-11 NOTE — Sedation Documentation (Signed)
Additional 20 mg propfol given. '40mg'$  total at this time
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
## 2021-08-11 NOTE — Sedation Documentation (Signed)
Right elbow reduced successfully by Dr. Sabra Heck

## 2021-08-26 ENCOUNTER — Encounter: Payer: Self-pay | Admitting: Orthopaedic Surgery

## 2021-08-26 ENCOUNTER — Other Ambulatory Visit: Payer: Self-pay

## 2021-08-26 ENCOUNTER — Ambulatory Visit: Payer: BC Managed Care – PPO | Admitting: Orthopaedic Surgery

## 2021-08-26 DIAGNOSIS — G4733 Obstructive sleep apnea (adult) (pediatric): Secondary | ICD-10-CM | POA: Diagnosis not present

## 2021-08-26 DIAGNOSIS — S53104A Unspecified dislocation of right ulnohumeral joint, initial encounter: Secondary | ICD-10-CM | POA: Diagnosis not present
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
## 2021-09-10 ENCOUNTER — Ambulatory Visit: Payer: BC Managed Care – PPO | Admitting: Orthopaedic Surgery

## 2021-09-10 ENCOUNTER — Encounter: Payer: Self-pay | Admitting: Orthopaedic Surgery

## 2021-09-10 ENCOUNTER — Other Ambulatory Visit: Payer: Self-pay

## 2021-09-10 DIAGNOSIS — S53104D Unspecified dislocation of right ulnohumeral joint, subsequent encounter: Secondary | ICD-10-CM | POA: Diagnosis not present

## 2021-09-10 NOTE — Progress Notes (Signed)
Office Visit Note   Patient: Tina Mckee           Date of Birth: 07-30-1991           MRN: [id number] Visit Date: 09/10/2021              Requested by: Burnis Medin, MD [place],  [place] [address] PCP: Burnis Medin, MD   Assessment & Plan: Visit Diagnoses:  1. Dislocation of right elbow, subsequent encounter     Plan: Impression is 4 weeks status post right elbow dislocation.  Patient is doing well and has regained full range of motion.  We will have her continue to work on range of motion for another 2 weeks.  No lifting more than 5 to 10 pounds for another 2 weeks.  She may then begin to increase activity as tolerated.  She will she will follow-up with [place] as needed.  Follow-Up Instructions: Return if symptoms worsen or fail to improve.   Orders:  No orders of the defined types were placed in this encounter.  No orders of the defined types were placed in this encounter.     Procedures: No procedures performed   Clinical Data: No additional findings.   Subjective: Chief Complaint  Patient presents with   Right Elbow - Follow-up    HPI patient is a pleasant 30 year old female who comes in today 4 weeks status post right elbow dislocation.  She has been doing well.  She was unable to attend OT due to a death in her family.  She has been working on range of motion exercises on her own.  She denies any pain.  Of note, she is status post CVA which caused right-sided hemiparesis and has little strength to the right upper extremity.  She uses her left upper extremity for lifting.     Objective: Vital Signs: There were no vitals taken for this visit.    Ortho Exam examination of the right elbow reveals no tenderness.  Full flexion, extension, supination and pronation without pain.  She is neurovascular intact distally.  Specialty Comments:  No specialty comments available.  Imaging: No new imaging   PMFS History: Patient Active  Problem List   Diagnosis Date Noted   OSA (obstructive sleep apnea) 06/03/2020   Insomnia 06/03/2020   Hemiparesis affecting right side as late effect of cerebrovascular accident ([place]) 02/06/2016   Pigmented skin lesion of uncertain nature 36/64/4034   Hyperlipidemia 02/20/2015   Visit for preventive health examination 02/20/2015   Hx of fall 02/20/2015   Encounter for IUD insertion 02/12/2015   Cystic 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, antepartum 11/14/2014   Rubella non-immune status, antepartum 07/31/2014   History of stroke 05/04/2014   Obesity (BMI 30-39.9) 09/29/2013   Thrombocytosis 10/01/2012   High blood triglycerides 09/27/2012   Abnormal LFTs 09/27/2012   Weight gain 09/27/2012   Shaking 04/22/2012   Hyperglycemia 09/23/2011   Abnormal glucose 09/21/2011   Falling episodes 09/21/2011   Cervical radicular pain 08/03/2011   Wrist pain 02/16/2011   Hemiparesis, right ([place]) 02/16/2011   SCOLIOSIS 01/16/2009   Past Medical History:  Diagnosis Date   Asthma    Gestational hypertension    Perinatal arterial ischemic stroke ([place])    Scoliosis    Sleep apnea    Stroke [facility]) birth   rt hemiparesis  Perinatal   Supervision of other normal pregnancy 05/04/2014    Family History  Problem Relation Age of Onset   Hypertension Mother  Asthma Mother    Hypertension Father    Sleep apnea Father    Asthma Sister    Cancer Maternal Grandfather        pancreatic   Cancer Paternal Grandmother        cancer all over   Other Paternal Grandfather        brain damage   Other Maternal Grandmother        heart problems    Past Surgical History:  Procedure Laterality Date   FOOT SURGERY     rt foot   FOOT SURGERY     Bone placed   moles removed     from back   Social History   Occupational History   Occupation: Customer Service  Tobacco Use   Smoking status: Former    Types: Cigarettes    Quit date: 07/11/2007    Years since quitting: 14.1   Smokeless tobacco: Never  Vaping Use    Vaping Use: Never used  Substance and Sexual Activity   Alcohol use: Yes    Alcohol/week: 1.0 standard drink    Types: 1 Standard drinks or equivalent per week    Comment: once a month   Drug use: No   Sexual activity: Yes    Birth control/protection: I.U.D.

## 2021-09-18 DIAGNOSIS — L814 Other melanin hyperpigmentation: Secondary | ICD-10-CM | POA: Diagnosis not present

## 2021-09-18 DIAGNOSIS — L821 Other seborrheic keratosis: Secondary | ICD-10-CM | POA: Diagnosis not present

## 2021-09-18 DIAGNOSIS — L218 Other seborrheic dermatitis: Secondary | ICD-10-CM | POA: Diagnosis not present

## 2021-09-18 DIAGNOSIS — D225 Melanocytic nevi of trunk: Secondary | ICD-10-CM | POA: Diagnosis not present

## 2021-09-25 DIAGNOSIS — G4733 Obstructive sleep apnea (adult) (pediatric): Secondary | ICD-10-CM | POA: Diagnosis not present

## 2021-11-11 ENCOUNTER — Telehealth: Payer: Self-pay

## 2021-11-11 NOTE — Telephone Encounter (Signed)
I spoke with the pt and she reported Productive cough with greenish sputum and fever, x4 days, Pt tested negative for Covid with at home test. I offered a visit with another provider on 11/12/2021. Pt declined visit and stated she would see PCP on 11/13/2021.

## 2021-11-13 ENCOUNTER — Telehealth: Payer: BC Managed Care – PPO | Admitting: Internal Medicine

## 2021-11-13 ENCOUNTER — Encounter: Payer: Self-pay | Admitting: Internal Medicine

## 2021-11-13 VITALS — Temp 97.9°F | Ht 61.0 in | Wt 242.0 lb

## 2021-11-13 DIAGNOSIS — G4733 Obstructive sleep apnea (adult) (pediatric): Secondary | ICD-10-CM | POA: Diagnosis not present

## 2021-11-13 DIAGNOSIS — Z79899 Other long term (current) drug therapy: Secondary | ICD-10-CM | POA: Diagnosis not present

## 2021-11-13 DIAGNOSIS — J22 Unspecified acute lower respiratory infection: Secondary | ICD-10-CM | POA: Diagnosis not present

## 2021-11-13 DIAGNOSIS — Z8709 Personal history of other diseases of the respiratory system: Secondary | ICD-10-CM

## 2021-11-13 DIAGNOSIS — Z20828 Contact with and (suspected) exposure to other viral communicable diseases: Secondary | ICD-10-CM

## 2021-11-13 MED ORDER — DOXYCYCLINE HYCLATE 100 MG PO TABS: 100.0000 mg | ORAL_TABLET | Freq: Two times a day (BID) | ORAL | 0 refills | Status: DC

## 2021-11-13 NOTE — Progress Notes (Signed)
Virtual Visit via Video Note  I connected withNAME@ on 11/13/21 at 11:00 AM EST by a video enabled telemedicine application and verified that I am speaking with the correct person using two identifiers. Location patient: home Location provider:work office Persons participating in the virtual visit: patient, provider  [place]th national recommendations  regarding COVID 19 pandemic   video visit is advised over in office visit for this patient.  Patient aware  of the limitations of evaluation and management by telemedicine and  availability of in person appointments. and agreed to proceed.   HPI: Tina Mckee presents for video Respiratory symptoms.  Travel to [place] and back in the holidays visit because of acute and was exposed to family members who had coughing illness later found out to people had RSV positive.  Began onset of fever upper respiratory congestion and cough like a bronchitis November 27 or thereabouts since that time she has had fever improving last night was only 99.6 and felt better during the day yesterday Upper respiratory congestion with "lots of snot" and last night her cough was not bad until 5 AM.  Has not had to use her inhaler too many times.  Staying away from her children so they do not get sick. Tested negative home 4 days ago for COVID. No current shortness of breath hemoptysis. Felt like bronchitis early on. Feels that she is better than at the beginning.  ROS: See pertinent positives and negatives per HPI.  Past Medical History:  Diagnosis Date   Asthma    Gestational hypertension    Perinatal arterial ischemic stroke ([place])    Scoliosis    Sleep apnea    Stroke [facility]) birth   rt hemiparesis  Perinatal   Supervision of other normal pregnancy 05/04/2014    Past Surgical History:  Procedure Laterality Date   FOOT SURGERY     rt foot   FOOT SURGERY     Bone placed   moles removed     from back    Family History  Problem Relation Age of Onset    Hypertension Mother    Asthma Mother    Hypertension Father    Sleep apnea Father    Asthma Sister    Cancer Maternal Grandfather        pancreatic   Cancer Paternal Grandmother        cancer all over   Other Paternal Grandfather        brain damage   Other Maternal Grandmother        heart problems    Social History   Tobacco Use   Smoking status: Former    Types: Cigarettes    Quit date: 07/11/2007    Years since quitting: 14.3   Smokeless tobacco: Never  Vaping Use   Vaping Use: Never used  Substance Use Topics   Alcohol use: Yes    Alcohol/week: 1.0 standard drink    Types: 1 Standard drinks or equivalent per week    Comment: once a month   Drug use: No      Current Outpatient Medications:    citalopram (CELEXA) 10 MG tablet, TAKE 1 TABLET BY MOUTH EVERY DAY, Disp: 90 tablet, Rfl: 1   doxycycline (VIBRA-TABS) 100 MG tablet, Take 1 tablet (100 mg total) by mouth 2 (two) times daily. As indicated for respiratory infection, Disp: 14 tablet, Rfl: 0   ibuprofen (ADVIL) 600 MG tablet, Take 1 tablet (600 mg total) by mouth every 6 (six) hours as needed., Disp:  30 tablet, Rfl: 0   ketoconazole (NIZORAL) 2 % shampoo, ketoconazole 2 % shampoo  APPLY TO THE SCALP 3 TIMES WEEKLY APPLY TO DAMP SCALP, LATHER, LET SIT 5 MINUTES AND RINSE, Disp: 120 mL, Rfl: 3   Multiple Vitamins-Calcium (ONE-A-DAY WOMENS PO), Take 1 tablet by mouth daily., Disp: , Rfl:    PARAGARD INTRAUTERINE COPPER IU, 1 application by Intrauterine route once. , Disp: , Rfl:    PROAIR HFA 108 (90 Base) MCG/ACT inhaler, INHALE 1-2 PUFFS INTO THE LUNGS EVERY 6 (SIX) HOURS AS NEEDED FOR WHEEZING OR SHORTNESS OF BREATH. (Patient taking differently: Inhale 1-2 puffs into the lungs every 6 (six) hours as needed for wheezing or shortness of breath.), Disp: 8.5 Inhaler, Rfl: 0  Current Facility-Administered Medications:    ondansetron (ZOFRAN) injection 4 mg, 4 mg, Intramuscular, Q8H PRN, Nancye Grumbine, Standley Brooking, MD, 4 mg at  01/11/19 1530  EXAM: BP Readings from Last 3 Encounters:  08/11/21 (!) 150/88  07/24/21 124/80  05/13/21 130/80    VITALS per patient if applicable:  GENERAL: alert, oriented, appears well and in no acute distress nontoxic very congested in the nasal passages but no facial edema.  Occasional cough no respiratory distress.  HEENT: atraumatic, conjunttiva clear, no obvious abnormalities on inspection of external nose and ears  NECK: normal movements of the head and neck  LUNGS: on inspection no signs of respiratory distress, breathing rate appears normal, no obvious gross SOB, gasping or wheezing  CV: no obvious cyanosis  PSYCH/NEURO: pleasant and cooperative, no obvious depression or anxiety, speech and thought processing grossly intact   ASSESSMENT AND PLAN:  Discussed the following assessment and plan:    ICD-10-CM   1. Acute respiratory infection  J22     2. History of acute bronchitis with bronchospasm  Z87.09     3. Medication management  Z79.899     4. OSA (obstructive sleep apnea)  G47.33     5. Exposure to respiratory syncytial virus (RSV)  Z20.828     Cute viral respiratory illness exposed to RSV onset with fever resolving and now with upper and lower respiratory symptoms. At risk for wheeze and secondary problem can use albuterol as needed at this time Symptomatic treatment seek care for alarm symptoms that she does not have at this point If sinuses are getting worse instead of improving because of the upcoming weekend and the urgent cares are full and sending ambulatory patients away for care, can add antibiotic if appropriate doxycycline twice daily sent in in case for sinusitis.  (Of note she is had symptoms with mono when she took cephalexin but states that she could take penicillin ) Consider retest home for COVID as her first test was very early. Counseled.   Expectant management and discussion of plan and treatment with opportunity to ask questions and  all were answered. The patient agreed with the plan and demonstrated an understanding of the instructions.   Advised to call back or seek an in-person evaluation if worsening  or having  further concerns  in interim. Return if symptoms worsen or fail to improve as expected.    Shanon Ace, MD

## 2021-12-16 ENCOUNTER — Ambulatory Visit (INDEPENDENT_AMBULATORY_CARE_PROVIDER_SITE_OTHER): Payer: BC Managed Care – PPO | Admitting: Internal Medicine

## 2021-12-16 ENCOUNTER — Encounter: Payer: Self-pay | Admitting: Internal Medicine

## 2021-12-16 ENCOUNTER — Encounter: Payer: Self-pay | Admitting: Neurology

## 2021-12-16 VITALS — BP 120/72 | HR 72 | Temp 98.5°F | Ht 63.0 in | Wt 249.2 lb

## 2021-12-16 DIAGNOSIS — G8191 Hemiplegia, unspecified affecting right dominant side: Secondary | ICD-10-CM | POA: Diagnosis not present

## 2021-12-16 DIAGNOSIS — Z0000 Encounter for general adult medical examination without abnormal findings: Secondary | ICD-10-CM

## 2021-12-16 DIAGNOSIS — E785 Hyperlipidemia, unspecified: Secondary | ICD-10-CM | POA: Diagnosis not present

## 2021-12-16 DIAGNOSIS — R739 Hyperglycemia, unspecified: Secondary | ICD-10-CM

## 2021-12-16 DIAGNOSIS — Z6841 Body Mass Index (BMI) 40.0 and over, adult: Secondary | ICD-10-CM | POA: Diagnosis not present

## 2021-12-16 DIAGNOSIS — G4733 Obstructive sleep apnea (adult) (pediatric): Secondary | ICD-10-CM

## 2021-12-16 DIAGNOSIS — Z8673 Personal history of transient ischemic attack (TIA), and cerebral infarction without residual deficits: Secondary | ICD-10-CM

## 2021-12-16 DIAGNOSIS — Z01419 Encounter for gynecological examination (general) (routine) without abnormal findings: Secondary | ICD-10-CM | POA: Diagnosis not present

## 2021-12-16 LAB — CBC WITH DIFFERENTIAL/PLATELET
Basophils Absolute: 0 10*3/uL (ref 0.0–0.1)
Basophils Relative: 0.5 % (ref 0.0–3.0)
Eosinophils Absolute: 0.3 10*3/uL (ref 0.0–0.7)
Eosinophils Relative: 3.2 % (ref 0.0–5.0)
HCT: 42.4 % (ref 36.0–46.0)
Hemoglobin: 14.4 g/dL (ref 12.0–15.0)
Lymphocytes Relative: 34.4 % (ref 12.0–46.0)
Lymphs Abs: 3.4 10*3/uL (ref 0.7–4.0)
MCHC: 34.1 g/dL (ref 30.0–36.0)
MCV: 85.9 fl (ref 78.0–100.0)
Monocytes Absolute: 0.6 10*3/uL (ref 0.1–1.0)
Monocytes Relative: 6 % (ref 3.0–12.0)
Neutro Abs: 5.6 10*3/uL (ref 1.4–7.7)
Neutrophils Relative %: 55.9 % (ref 43.0–77.0)
Platelets: 496 10*3/uL — ABNORMAL HIGH (ref 150.0–400.0)
RBC: 4.93 Mil/uL (ref 3.87–5.11)
RDW: 12.8 % (ref 11.5–15.5)
WBC: 10 10*3/uL (ref 4.0–10.5)

## 2021-12-16 LAB — LIPID PANEL
Cholesterol: 209 mg/dL — ABNORMAL HIGH (ref 0–200)
HDL: 39.1 mg/dL (ref >39.00)
NonHDL: 170.05
Total CHOL/HDL Ratio: 5
Triglycerides: 219 mg/dL — ABNORMAL HIGH (ref 0.0–149.0)
VLDL: 43.8 mg/dL — ABNORMAL HIGH (ref 0.0–40.0)

## 2021-12-16 LAB — TSH: TSH: 2.88 u[IU]/mL (ref 0.35–5.50)

## 2021-12-16 LAB — HEPATIC FUNCTION PANEL
ALT: 15 U/L (ref 0–35)
AST: 12 U/L (ref 0–37)
Albumin: 4.1 g/dL (ref 3.5–5.2)
Alkaline Phosphatase: 57 U/L (ref 39–117)
Bilirubin, Direct: 0.1 mg/dL (ref 0.0–0.3)
Total Bilirubin: 1 mg/dL (ref 0.2–1.2)
Total Protein: 6.9 g/dL (ref 6.0–8.3)

## 2021-12-16 LAB — T4, FREE: Free T4: 0.72 ng/dL (ref 0.60–1.60)

## 2021-12-16 LAB — BASIC METABOLIC PANEL
BUN: 15 mg/dL (ref 6–23)
CO2: 24 mEq/L (ref 19–32)
Calcium: 9.2 mg/dL (ref 8.4–10.5)
Chloride: 102 mEq/L (ref 96–112)
Creatinine, Ser: 0.54 mg/dL (ref 0.40–1.20)
GFR: 121.6 mL/min (ref >60.00)
Glucose, Bld: 114 mg/dL — ABNORMAL HIGH (ref 70–99)
Potassium: 4.1 mEq/L (ref 3.5–5.1)
Sodium: 135 mEq/L (ref 135–145)

## 2021-12-16 LAB — HEMOGLOBIN A1C: Hgb A1c MFr Bld: 5.4 % (ref 4.6–6.5)

## 2021-12-16 LAB — LDL CHOLESTEROL, DIRECT: Direct LDL: 161 mg/dL

## 2021-12-16 MED ORDER — CITALOPRAM HYDROBROMIDE 20 MG PO TABS: 20.0000 mg | ORAL_TABLET | Freq: Every day | ORAL | 2 refills | Status: DC
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
## 2021-12-21 NOTE — Progress Notes (Signed)
Blood sugar slightly up but no diabetes , no anemia ,  Kidney liver test normal Cholesterol  still elevated triglycerides  healthy weight loss and diet changes and less sugars  should help .   Weight management can also help. And follow up.

## 2021-12-22 ENCOUNTER — Other Ambulatory Visit: Payer: Self-pay | Admitting: Internal Medicine

## 2021-12-23 DIAGNOSIS — R102 Pelvic and perineal pain: Secondary | ICD-10-CM | POA: Diagnosis not present

## 2021-12-23 DIAGNOSIS — N939 Abnormal uterine and vaginal bleeding, unspecified: Secondary | ICD-10-CM | POA: Diagnosis not present

## 2021-12-23 DIAGNOSIS — N946 Dysmenorrhea, unspecified: Secondary | ICD-10-CM | POA: Diagnosis not present

## 2021-12-30 DIAGNOSIS — Z30432 Encounter for removal of intrauterine contraceptive device: Secondary | ICD-10-CM | POA: Diagnosis not present

## 2022-01-06 DIAGNOSIS — G4733 Obstructive sleep apnea (adult) (pediatric): Secondary | ICD-10-CM | POA: Diagnosis not present

## 2022-01-29 DIAGNOSIS — S93491A Sprain of other ligament of right ankle, initial encounter: Secondary | ICD-10-CM | POA: Diagnosis not present

## 2022-03-10 NOTE — Progress Notes (Signed)
? ?NEUROLOGY CONSULTATION NOTE ? ?Tina Mckee ?MRN: [id number] ?DOB: 01-26-1991 ? ?Referring provider: Shanon Ace, MD ?Primary care provider: Shanon Ace, MD ? ?Reason for consult:  perinatal stroke, falls, abnormal movements ? ?Assessment/Plan:  ? 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  Involvement of right foot appears more dystonic.  Prior neurologist had concern for epilepsia partialis continua (EPC), particularly due to EEG results.   ?Falls.  I think it may have to do with instability of her right knee.  I don't suspect any new/progressed intracranial abnormality.  This has been stable for years. ? ? ?1  Will check routine EEG ?2  In case her abnormal movements represent focal seizure, will start an antiepileptic drug regardless of EEG results (as EPC may be surface negative) and see if symptoms improve. ?3  Follow up 5 months. ? ? 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 ? ?She has congenital mild right upper extremity weakness and spasticity secondary to perinatal stroke.  For several years, she will fall.  It will occur every 3 months or so.  She says her right leg will just give out.  She does report some right knee pain.  Since around 2013, she has had arrhythmic movements of her right upper extremity and right foot.  She was evaluated by neurology for these movements at that time.  At that time, she also exhibited jerking in the left hand.  MRI of brain on 06/02/2012 personally reviewed revealed old left MCA stroke but no new findings.  She had a routine EEG that revealed intermittent arrhythmic theta and delta slowing in the left central parietal region that appeared to correlate with right arm and leg jerking.  The neurologist thought that the  right sided leg movements may be epilepsia partialis continua.  She was started on Keppra but discontinued due to dizziness and was instead started on clonazepam.  She was lost to follow up.    She continues to have involuntary right sided movements.  It occurs daily throughout the day, at rest or while in bed.  She says the falls are not associated with involuntary movement of the right lower extremity.   ? ? ?06/02/2012 EEG:  This routine EEG done with the patient awake ?is abnormal.  Intermittent arrhythmic theta and delta range slowing were seen in particular in the left central parietal regions.  Sometimes, ?these seemed to correlate with right arm jerking.  Left arm jerks which ?were much less frequent, did not seem to have a clear correlate.  However, it is felt that these jerking episodes are likely epileptic in nature. ?06/02/2012 MRI BRAIN WO:  1. No acute intracranial abnormality.  ?2.  Remote left MCA infarct with associated Wallerian degeneration. No chronic blood products.   ?02/20/2016 MRI BRAIN WO:  1. Unchanged appearance of the brain without acute abnormality or mass.  2. Chronic left MCA infarct. ? ?PAST MEDICAL HISTORY: ?Past Medical History:  ?Diagnosis Date  ? Asthma   ? Gestational hypertension   ? Perinatal arterial ischemic stroke [facility])   ? Scoliosis   ? Sleep apnea   ? Stroke [facility]) birth  ? rt hemiparesis  Perinatal  ? Supervision of other normal pregnancy 05/04/2014  ? ? ?PAST SURGICAL HISTORY: ?Past Surgical History:  ?Procedure Laterality Date  ? FOOT  SURGERY    ? rt foot  ? FOOT SURGERY    ? Bone placed  ? moles removed    ? from back  ? ? ?MEDICATIONS: ?Current Outpatient Medications on File Prior to Visit  ?Medication Sig Dispense Refill  ? citalopram (CELEXA) 20 MG tablet Take 1 tablet (20 mg total) by mouth daily. 90 tablet 2  ? doxycycline (VIBRA-TABS) 100 MG tablet Take 1 tablet (100 mg total) by mouth 2 (two) times daily. As indicated for respiratory infection 14 tablet 0  ?  ibuprofen (ADVIL) 600 MG tablet Take 1 tablet (600 mg total) by mouth every 6 (six) hours as needed. 30 tablet 0  ? ketoconazole (NIZORAL) 2 % shampoo ketoconazole 2 % shampoo ? APPLY TO THE SCALP 3 TIMES WEEKLY APPLY TO DAMP SCALP, LATHER, LET SIT 5 MINUTES AND RINSE 120 mL 3  ? Multiple Vitamins-Calcium (ONE-A-DAY WOMENS PO) Take 1 tablet by mouth daily.    ? PARAGARD INTRAUTERINE COPPER IU 1 application by Intrauterine route once.     ? PROAIR HFA 108 (90 Base) MCG/ACT inhaler INHALE 1-2 PUFFS INTO THE LUNGS EVERY 6 (SIX) HOURS AS NEEDED FOR WHEEZING OR SHORTNESS OF BREATH. (Patient taking differently: Inhale 1-2 puffs into the lungs every 6 (six) hours as needed for wheezing or shortness of breath.) 8.5 Inhaler 0  ? ?Current Facility-Administered Medications on File Prior to Visit  ?Medication Dose Route Frequency Provider Last Rate Last Admin  ? ondansetron (ZOFRAN) injection 4 mg  4 mg Intramuscular Q8H PRN Panosh, Standley Brooking, MD   4 mg at 01/11/19 1530  ? ? ?ALLERGIES: ?Allergies  ?Allergen Reactions  ? Cephalexin Rash and Itching  ? ? ?FAMILY HISTORY: ?Family History  ?Problem Relation Age of Onset  ? Hypertension Mother   ? Asthma Mother   ? Hypertension Father   ? Sleep apnea Father   ? Asthma Sister   ? Cancer Maternal Grandfather   ?     pancreatic  ? Cancer Paternal Grandmother   ?     cancer all over  ? Other Paternal Grandfather   ?     brain damage  ? Other Maternal Grandmother   ?     heart problems  ? ? ?Objective:  ?Blood pressure (!) 147/99, pulse 96, height '5\' 1"'$  (1.549 m), weight 246 lb (111.6 kg), SpO2 94 %, currently breastfeeding. ?General: No acute distress.  Patient appears well-groomed.   ?Head:  Normocephalic/atraumatic ?Eyes:  fundi examined but not visualized ?Neck: supple, no paraspinal tenderness, full range of motion ?Back: No paraspinal tenderness ?Heart: regular rate and rhythm ?Lungs: Clear to auscultation bilaterally. ?Vascular: No carotid bruits. ?Neurological Exam: ?Mental  status: alert and oriented to person, place, and time, recent and remote memory intact, fund of knowledge intact, attention and concentration intact, speech fluent and not dysarthric, language intact. ?Cranial nerves: ?CN I: not tested ?CN II: pupils equal, round and reactive to light, visual fields intact ?CN III, IV, VI:  full range of motion, no nystagmus, no ptosis ?CN V: reduced right V1-V3 ?CN VII: upper and lower face symmetric ?CN VIII: hearing intact ?CN IX, X: gag intact, uvula midline ?CN XI: sternocleidomastoid and trapezius muscles intact ?CN XII: tongue midline ?Bulk & Tone: slightly increased in right upper extremity; no fasciculations. ?Motor:  muscle strength 5-/5 RUE except 5/5 right intrinsic hand muscles.  Otherwise, 5/5.  At one point, noted brief jerk of right shoulder.  Arrhythmic, sometimes athetoid appearing movements of right foot. ?Sensation:  Pinprick and vibratory sensation reduced in right upper and lower extremities.  Intact on left. ?Deep Tendon Reflexes:  2+ throughout,  toes downgoing.   ?Finger to nose testing:  Without dysmetria.   ?Heel to shin:  Without dysmetria.   ?Gait:  Slight hemiparetic gait.  Romberg negative. ? ? ? ?Thank you for allowing me to take part in the care of this patient. ? ?Metta Clines, DO ? ?CC: Shanon Ace, MD ? ? ? ? ?

## 2022-03-11 ENCOUNTER — Other Ambulatory Visit: Payer: Self-pay

## 2022-03-11 ENCOUNTER — Encounter: Payer: Self-pay | Admitting: Neurology

## 2022-03-11 ENCOUNTER — Ambulatory Visit: Payer: BC Managed Care – PPO | Admitting: Neurology

## 2022-03-11 VITALS — BP 147/99 | HR 96 | Ht 61.0 in | Wt 246.0 lb

## 2022-03-11 DIAGNOSIS — R296 Repeated falls: Secondary | ICD-10-CM | POA: Diagnosis not present

## 2022-03-11 DIAGNOSIS — R259 Unspecified abnormal involuntary movements: Secondary | ICD-10-CM | POA: Diagnosis not present

## 2022-03-11 DIAGNOSIS — I69351 Hemiplegia and hemiparesis following cerebral infarction affecting right dominant side: Secondary | ICD-10-CM

## 2022-03-11 NOTE — Addendum Note (Signed)
Addended byTomi Likens, Dillan Candela R on: 03/11/2022 11:53 AM ? ? Modules accepted: Orders ? ?

## 2022-03-11 NOTE — Patient Instructions (Signed)
Will check routine EEG. ?Following EEG, will start an anti-seizure medication ?Follow up 5 months. ?

## 2022-03-11 NOTE — Addendum Note (Signed)
Addended by: Venetia Night on: 03/11/2022 01:36 PM ? ? Modules accepted: Orders ? ?

## 2022-03-30 ENCOUNTER — Telehealth: Payer: Self-pay | Admitting: *Deleted

## 2022-03-30 NOTE — Telephone Encounter (Signed)
LMOM to call us if she wants to come in for EEG sooner appt. We had a cancellation today at 1pm, Give [place] a call to see if still available if she wants to move appt. ?

## 2022-04-01 DIAGNOSIS — Z30430 Encounter for insertion of intrauterine contraceptive device: Secondary | ICD-10-CM | POA: Diagnosis not present

## 2022-04-06 ENCOUNTER — Ambulatory Visit: Payer: BC Managed Care – PPO | Admitting: Neurology

## 2022-04-06 DIAGNOSIS — R259 Unspecified abnormal involuntary movements: Secondary | ICD-10-CM | POA: Diagnosis not present

## 2022-04-06 NOTE — Procedures (Signed)
ELECTROENCEPHALOGRAM REPORT ? ?Date of Study: 04/06/2022 ? ?Patient's Name: Tina Mckee ?MRN: [id number] ?Date of Birth: 03/10/1991 ? ? 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 ? ?Medications: ?CELEXA 20 MG tablet ?VIBRA-TABS 100 MG tablet ?ADVIL 600 MG tablet ?NIZORAL 2 % shampoo ?ONE-A-DAY WOMENS PO ?PARAGARD INTRAUTERINE COPPER IU ?PROAIR HFA 108 (90 Base) MCG/ACT inhaler ? ?Technical Summary: ?A multichannel digital EEG recording measured by the international 10-20 system with electrodes applied with paste and impedances below 5000 ohms performed in our laboratory with EKG monitoring in an awake and drowsy patient.  Hyperventilation and photic stimulation were performed.  The digital EEG was referentially recorded, reformatted, and digitally filtered in a variety of bipolar and referential montages for optimal display.   ? ?Description: ?The patient is awake and drowsy during the recording.  During maximal wakefulness, there is a symmetric, medium voltage 10 Hz posterior dominant rhythm that attenuates with eye opening.  The record is symmetric.  During drowsiness and sleep, there is an increase in theta slowing of the background.  Vertex waves and symmetric sleep spindles were seen.  Hyperventilation and photic stimulation did not elicit any abnormalities.  There were no epileptiform discharges or electrographic seizures seen.   ? ?EKG lead was unremarkable. ? ?Impression: ?This awake and drowsy EEG is normal.   ? ?Clinical Correlation: ?A normal EEG does not exclude a clinical diagnosis of epilepsy.  If further clinical questions remain, prolonged EEG may be helpful.  Clinical correlation is advised. ? ? ?Metta Clines, DO ? ?

## 2022-04-07 ENCOUNTER — Other Ambulatory Visit: Payer: Self-pay

## 2022-04-07 MED ORDER — LAMOTRIGINE 25 MG PO TABS: ORAL_TABLET | ORAL | 0 refills | Status: DC

## 2022-04-07 NOTE — Progress Notes (Signed)
Per Dr.Jaffe, start lamotrigine '50mg'$  tablet - take 1/2 tablet at bedtime for 14 days, then 1/2 tablet twice daily for 14 days, then 1 tablet twice daily for 7 days, then 2 tablets twice daily.  ? ?If she should develop any new rash, contact us immediately.  Rarely this medication may cause a dangerous rash while titrating up on the dose.  ?

## 2022-04-18 ENCOUNTER — Telehealth: Payer: Self-pay | Admitting: Urgent Care

## 2022-04-18 DIAGNOSIS — N3000 Acute cystitis without hematuria: Secondary | ICD-10-CM

## 2022-04-18 MED ORDER — NITROFURANTOIN MONOHYD MACRO 100 MG PO CAPS: 100.0000 mg | ORAL_CAPSULE | Freq: Two times a day (BID) | ORAL | 0 refills | Status: AC

## 2022-04-18 NOTE — Progress Notes (Signed)
E-Visit for Urinary Problems  We are sorry that you are not feeling well.  Here is how we plan to help!  Based on what you shared with me it looks like you most likely have a simple urinary tract infection.  A UTI (Urinary Tract Infection) is a bacterial infection of the bladder.  Most cases of urinary tract infections are simple to treat but a key part of your care is to encourage you to drink plenty of fluids and watch your symptoms carefully.  I have prescribed MacroBid 100 mg twice a day for 5 days.  Your symptoms should gradually improve. Call us if the burning in your urine worsens, you develop worsening fever, back pain or pelvic pain or if your symptoms do not resolve after completing the antibiotic.  Urinary tract infections can be prevented by drinking plenty of water to keep your body hydrated.  Also be sure when you wipe, wipe from front to back and don't hold it in!  If possible, empty your bladder every 4 hours.  HOME CARE Drink plenty of fluids Compete the full course of the antibiotics even if the symptoms resolve Remember, when you need to go.go. Holding in your urine can increase the likelihood of getting a UTI! GET HELP RIGHT AWAY IF: You cannot urinate You get a high fever Worsening back pain occurs You see blood in your urine You feel sick to your stomach or throw up You feel like you are going to pass out  MAKE SURE YOU  Understand these instructions. Will watch your condition. Will get help right away if you are not doing well or get worse.   Thank you for choosing an e-visit.  Your e-visit answers were reviewed by a board certified advanced clinical practitioner to complete your personal care plan. Depending upon the condition, your plan could have included both over the counter or prescription medications.  Please review your pharmacy choice. Make sure the pharmacy is open so you can pick up prescription now. If there is a problem, you may contact your  provider through MyChart messaging and have the prescription routed to another pharmacy.  Your safety is important to us. If you have drug allergies check your prescription carefully.   For the next 24 hours you can use MyChart to ask questions about today's visit, request a non-urgent call back, or ask for a work or school excuse. You will get an email in the next two days asking about your experience. I hope that your e-visit has been valuable and will speed your recovery.   I have spent 5 minutes in review of e-visit questionnaire, review and updating patient chart, medical decision making and response to patient.   Caralina Nop L Salisa Broz, PA    

## 2022-04-30 DIAGNOSIS — L821 Other seborrheic keratosis: Secondary | ICD-10-CM | POA: Diagnosis not present

## 2022-04-30 DIAGNOSIS — D492 Neoplasm of unspecified behavior of bone, soft tissue, and skin: Secondary | ICD-10-CM | POA: Diagnosis not present

## 2022-04-30 DIAGNOSIS — L814 Other melanin hyperpigmentation: Secondary | ICD-10-CM | POA: Diagnosis not present

## 2022-04-30 DIAGNOSIS — H0260 Xanthelasma of unspecified eye, unspecified eyelid: Secondary | ICD-10-CM | POA: Diagnosis not present

## 2022-04-30 DIAGNOSIS — B079 Viral wart, unspecified: Secondary | ICD-10-CM | POA: Diagnosis not present

## 2022-04-30 DIAGNOSIS — D225 Melanocytic nevi of trunk: Secondary | ICD-10-CM | POA: Diagnosis not present

## 2022-04-30 DIAGNOSIS — L538 Other specified erythematous conditions: Secondary | ICD-10-CM | POA: Diagnosis not present

## 2022-05-06 DIAGNOSIS — G4733 Obstructive sleep apnea (adult) (pediatric): Secondary | ICD-10-CM | POA: Diagnosis not present
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
## 2022-05-14 DIAGNOSIS — N912 Amenorrhea, unspecified: Secondary | ICD-10-CM | POA: Diagnosis not present

## 2022-05-29 ENCOUNTER — Telehealth: Payer: BC Managed Care – PPO | Admitting: Physician Assistant

## 2022-05-29 DIAGNOSIS — R3989 Other symptoms and signs involving the genitourinary system: Secondary | ICD-10-CM

## 2022-05-29 MED ORDER — SULFAMETHOXAZOLE-TRIMETHOPRIM 800-160 MG PO TABS
1.0000 | ORAL_TABLET | Freq: Two times a day (BID) | ORAL | 0 refills | Status: DC
Start: 2022-05-29 — End: 2022-08-19

## 2022-05-29 NOTE — Progress Notes (Signed)

## 2022-06-21 ENCOUNTER — Other Ambulatory Visit: Payer: Self-pay | Admitting: Neurology

## 2022-07-12 ENCOUNTER — Other Ambulatory Visit: Payer: Self-pay | Admitting: Internal Medicine

## 2022-08-18 NOTE — Progress Notes (Signed)
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  UPDATE: EEG on 04/06/2022 was normal.  Started on lamotrigine, taking '100mg'$  twice daily.  Notes some improvement.  Not as severe.  Infrequent.  Usually occurs when she is anxious.     HISTORY: She has congenital mild right upper extremity weakness and spasticity secondary to perinatal stroke.  For several years, she will fall.  It will occur every 3 months or so.  She says her right leg will just give out.  She does report some right knee pain.  Since around 2013, she has had arrhythmic movements of her right upper extremity and right foot.  She was evaluated by neurology for these movements at that time.  At that time, she also exhibited jerking in the left hand.  MRI of brain on 06/02/2012 personally reviewed revealed old left MCA stroke but no new findings.  She had a routine EEG that revealed intermittent arrhythmic theta and delta slowing in the left central parietal region that appeared to correlate with right arm and leg jerking.  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  She was started on Keppra but discontinued due to dizziness and was instead started on clonazepam.  She was lost to follow up.    She continues to have involuntary right sided movements.  It occurs daily throughout the day, at rest or while in bed.  She says the falls are not associated with involuntary movement of the right lower extremity.       06/02/2012 EEG:  This routine EEG done with the patient awake is abnormal.  Intermittent arrhythmic theta and delta range slowing  were seen in particular in the left central parietal regions.  Sometimes, these seemed to correlate with right arm jerking.  Left arm jerks which were much less frequent, did not seem to have a clear correlate.  However, it is felt that these jerking episodes are likely epileptic in nature. 06/02/2012 MRI BRAIN WO:  1. No acute intracranial abnormality.  2.  Remote left MCA infarct with associated Wallerian degeneration. No chronic blood products.   02/20/2016 MRI BRAIN WO:  1. Unchanged appearance of the brain without acute abnormality or mass.  2. Chronic left MCA infarct.    PAST MEDICAL HISTORY: Past Medical History:  Diagnosis Date   Asthma    Gestational hypertension    Perinatal arterial ischemic stroke ([place])    Scoliosis    Sleep apnea    Stroke [facility]) birth   rt hemiparesis  Perinatal   Supervision of other normal pregnancy 05/04/2014    MEDICATIONS: Current Outpatient Medications on File Prior to Visit  Medication Sig Dispense Refill   citalopram (CELEXA) 20 MG tablet TAKE 1 TABLET(20 MG) BY MOUTH DAILY 90 tablet 2   ibuprofen (ADVIL) 600 MG tablet Take 1 tablet (600 mg total) by mouth every 6 (six) hours as needed. 30 tablet 0   ketoconazole (NIZORAL) 2 % shampoo ketoconazole 2 % shampoo  APPLY TO THE SCALP 3 TIMES WEEKLY APPLY TO DAMP SCALP, LATHER, LET SIT 5 MINUTES AND RINSE 120 mL 3  lamoTRIgine (LAMICTAL) 25 MG tablet Take  2 tab TWICE DAILY 120 tablet 1   Multiple Vitamins-Calcium (ONE-A-DAY WOMENS PO) Take 1 tablet by mouth daily.     PROAIR HFA 108 (90 Base) MCG/ACT inhaler INHALE 1-2 PUFFS INTO THE LUNGS EVERY 6 (SIX) HOURS AS NEEDED FOR WHEEZING OR SHORTNESS OF BREATH. (Patient taking differently: Inhale 1-2 puffs into the lungs every 6 (six) hours as needed for wheezing or shortness of breath.) 8.5 Inhaler 0   sulfamethoxazole-trimethoprim (BACTRIM DS) 800-160 MG tablet Take 1 tablet by mouth 2 (two) times daily. 10 tablet 0   Current Facility-Administered  Medications on File Prior to Visit  Medication Dose Route Frequency Provider Last Rate Last Admin   ondansetron (ZOFRAN) injection 4 mg  4 mg Intramuscular Q8H PRN Panosh, Standley Brooking, MD   4 mg at 01/11/19 1530    ALLERGIES: Allergies  Allergen Reactions   Cephalexin Rash and Itching    FAMILY HISTORY: Family History  Problem Relation Age of Onset   Hypertension Mother    Asthma Mother    Hypertension Father    Sleep apnea Father    Asthma Sister    Cancer Maternal Grandfather        pancreatic   Cancer Paternal Grandmother        cancer all over   Other Paternal Grandfather        brain damage   Other Maternal Grandmother        heart problems      Objective:  Blood pressure 125/87, pulse 75, height '5\' 1"'$  (1.549 m), weight 246 lb (111.6 kg), SpO2 95 %, currently breastfeeding. General: No acute distress.  Patient appears well-groomed.   Head:  Normocephalic/atraumatic Eyes:  Fundi examined but not visualized Neck: supple, no paraspinal tenderness, full range of motion Heart:  Regular rate and rhythm Neurological Exam: alert and oriented to person, place, and time.  Speech fluent and not dysarthric, language intact.  Reduced right V1-V3.  Otherwise, CN II-XII intact. Slightly increased tone in right upper extremities; muscle 5-/5 RUE except 5/5 right intrinisic hand muscles.  Otherwise, muscle strength 5/5 throughout.  Sensation to light touch intact.  Deep tendon reflexes 2+ throughout, toes downgoing.  Finger to nose testing intact.  Slight hemiparetic gait, Romberg negative.   Metta Clines, DO  CC: Shanon Ace, MD

## 2022-08-19 ENCOUNTER — Ambulatory Visit: Payer: BC Managed Care – PPO | Admitting: Neurology

## 2022-08-19 ENCOUNTER — Telehealth: Payer: BC Managed Care – PPO | Admitting: Physician Assistant

## 2022-08-19 VITALS — BP 125/87 | HR 75 | Ht 61.0 in | Wt 246.0 lb

## 2022-08-19 DIAGNOSIS — G40109 Localization-related (focal) (partial) symptomatic epilepsy and epileptic syndromes with simple partial seizures, not intractable, without status epilepticus: Secondary | ICD-10-CM

## 2022-08-19 DIAGNOSIS — R3989 Other symptoms and signs involving the genitourinary system: Secondary | ICD-10-CM

## 2022-08-19 MED ORDER — SULFAMETHOXAZOLE-TRIMETHOPRIM 800-160 MG PO TABS: 1.0000 | ORAL_TABLET | Freq: Two times a day (BID) | ORAL | 0 refills | Status: DC

## 2022-08-19 MED ORDER — LAMOTRIGINE 25 MG PO TABS: ORAL_TABLET | ORAL | 5 refills | Status: DC

## 2022-08-19 NOTE — Progress Notes (Signed)

## 2022-08-20 ENCOUNTER — Encounter: Payer: Self-pay | Admitting: Neurology

## 2022-09-30 ENCOUNTER — Telehealth: Payer: Self-pay | Admitting: Adult Health

## 2022-09-30 NOTE — Telephone Encounter (Signed)
Pt has not been seen at office in over a year so will need an appt prior to [place] being able to do anything with her CPAP.  Called and spoke with pt letting her know this info and she verbalized understanding. Scheduled pt for an appt with BW tomorrow 10/19. Nothing further needed.

## 2022-09-30 NOTE — Telephone Encounter (Signed)
Patient called to request a new script for her cpap.  She stated that [facility] stated that her prescription had expired.  Please advise and call patient to confirm new prescription has been sent.  CB# [phone]

## 2022-10-01 ENCOUNTER — Ambulatory Visit: Payer: BC Managed Care – PPO | Admitting: Primary Care

## 2022-10-01 DIAGNOSIS — G4733 Obstructive sleep apnea (adult) (pediatric): Secondary | ICD-10-CM

## 2022-10-01 NOTE — Assessment & Plan Note (Addendum)
-   Mild OSA well controlled on auto CPAP without residual apneas. HST August 2021>> AHI 13/hour  - She is 100% compliant with CPAP and reports benefit from use. - Current pressure 5 to 15 cm H2O; AHI 2.6/hr - No changes to pressure settings - Continue to wear CPAP every night 4-6 hours or longer; Avoid back sleeping position  - Encourage weight loss efforts  - Renew CPAP supplies with [facility]  - FU in 1 year or sooner if needed

## 2022-10-01 NOTE — Progress Notes (Signed)
'@Patient'  ID: Tina Mckee, female    DOB: [birth year], 31 y.o.   MRN: [id number]  Chief Complaint  Patient presents with   Follow-up    Wearing CPAP-doing good    Referring provider: Burnis Medin, MD  HPI: 31 year old female, former smoker quit in 2008.  Past medical history significant for OSA.  Home sleep study showed mild OSA, AHI 13/hour (events were severe during supine sleep).  10/01/2022 Patient presents today for annual follow-up for OSA.  She is doing well. She is 100% compliant with CPAP and reports benefit from use. She has more energy during the daytime since being started on PAP therapy. She is sleeping well through the night, she will only wake up if her daughter comes into her room. She has no trouble falling back to sleep. No issues with pressure settings or mask fit. She wears nasal mask. Current setting 5 to 15 cm H2O. No residual daytime sleepiness. 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   Airview download 09/01/2022 - 09/30/2022 30/30 days used; 100% greater than 4 hours Average usage 7 hours 33 minutes Pressure 5 to 15 cm H2O (95th percent tile-11.8 cm H2O) Air leaks 1.2 L/min (95 percentile) AHI 2.6   Allergies  Allergen Reactions   Cephalexin Rash and Itching    Immunization History  Administered Date(s) Administered   DTP 06/22/1989, 09/02/1989, 10/26/1989, 01/04/1991, 05/11/1994   HIB (PRP-OMP) 10/26/1989, 05/28/1990, 07/22/1990   Hepatitis B 08/31/2000, 10/05/2000, 02/22/2001   Influenza Split 09/21/2011, 09/27/2012   Influenza,inj,Quad PF,6+ Mos 09/29/2013, 09/25/2014, 12/19/2015, 10/13/2016, 10/15/2017   Influenza-Unspecified 10/15/2017, 10/19/2018, 11/08/2019, 10/19/2020, 12/02/2021   MMR 07/22/1990, 05/11/1994   Moderna Sars-Covid-2 Vaccination 02/10/2020, 03/09/2020, 10/19/2020   OPV 06/22/1989, 09/02/1989, 01/04/1991, 05/11/1994   PFIZER Comirnaty(Gray Top)Covid-19 Tri-Sucrose Vaccine 12/02/2021   Td 07/15/2001   Tdap 08/03/2011, 11/14/2014    Past  Medical History:  Diagnosis Date   Asthma    Gestational hypertension    Perinatal arterial ischemic stroke ([place])    Scoliosis    Sleep apnea    Stroke ([place]) birth   rt hemiparesis  Perinatal   Supervision of other normal pregnancy 05/04/2014    Tobacco History: Social History   Tobacco Use  Smoking Status Former   Types: Cigarettes   Quit date: 07/11/2007   Years since quitting: 15.2  Smokeless Tobacco Never   Counseling given: Not Answered   Outpatient Medications Prior to Visit  Medication Sig Dispense Refill   citalopram (CELEXA) 20 MG tablet TAKE 1 TABLET(20 MG) BY MOUTH DAILY 90 tablet 2   ibuprofen (ADVIL) 600 MG tablet Take 1 tablet (600 mg total) by mouth every 6 (six) hours as needed. 30 tablet 0   ketoconazole (NIZORAL) 2 % shampoo ketoconazole 2 % shampoo  APPLY TO THE SCALP 3 TIMES WEEKLY APPLY TO DAMP SCALP, LATHER, LET SIT 5 MINUTES AND RINSE 120 mL 3   lamoTRIgine (LAMICTAL) 25 MG tablet Take  2 tab TWICE DAILY 120 tablet 5   levonorgestrel (MIRENA, 52 MG,) 20 MCG/DAY IUD Provided by [facility]     Multiple Vitamins-Calcium (ONE-A-DAY WOMENS PO) Take 1 tablet by mouth daily.     PROAIR HFA 108 (90 Base) MCG/ACT inhaler INHALE 1-2 PUFFS INTO THE LUNGS EVERY 6 (SIX) HOURS AS NEEDED FOR WHEEZING OR SHORTNESS OF BREATH. (Patient taking differently: Inhale 1-2 puffs into the lungs every 6 (six) hours as needed for wheezing or shortness of breath.) 8.5 Inhaler 0   sulfamethoxazole-trimethoprim (BACTRIM DS) 800-160 MG tablet Take 1 tablet by mouth  2 (two) times daily. (Patient not taking: Reported on 10/01/2022) 10 tablet 0   No facility-administered medications prior to visit.   Review of Systems  Review of Systems  Constitutional: Negative.  Negative for fatigue.  Respiratory: Negative.  Negative for apnea.   Psychiatric/Behavioral:  Negative for sleep disturbance.     Physical Exam  BP 120/80 (BP Location: Left Arm, Cuff Size: Large)   Pulse 84   Temp  98.1 F (36.7 C) (Temporal)   Ht '5\' 1"'  (1.549 m)   Wt 245 lb 6.4 oz (111.3 kg)   SpO2 98%   BMI 46.37 kg/m  Physical Exam Constitutional:      Appearance: Normal appearance.  HENT:     Head: Normocephalic and atraumatic.     Mouth/Throat:     Mouth: Mucous membranes are moist.     Pharynx: Oropharynx is clear.  Cardiovascular:     Rate and Rhythm: Normal rate and regular rhythm.  Pulmonary:     Effort: Pulmonary effort is normal.     Breath sounds: Normal breath sounds.  Musculoskeletal:        General: Normal range of motion.     Comments: Hx right sided hemiparesis   Skin:    General: Skin is warm and dry.  Neurological:     General: No focal deficit present.     Mental Status: She is alert and oriented to person, place, and time. Mental status is at baseline.  Psychiatric:        Mood and Affect: Mood normal.        Behavior: Behavior normal.        Thought Content: Thought content normal.        Judgment: Judgment normal.      Lab Results:  CBC    Component Value Date/Time   WBC 10.0 12/16/2021 0856   RBC 4.93 12/16/2021 0856   HGB 14.4 12/16/2021 0856   HCT 42.4 12/16/2021 0856   PLT 496.0 (H) 12/16/2021 0856   MCV 85.9 12/16/2021 0856   MCH 28.8 02/22/2021 0750   MCHC 34.1 12/16/2021 0856   RDW 12.8 12/16/2021 0856   LYMPHSABS 3.4 12/16/2021 0856   MONOABS 0.6 12/16/2021 0856   EOSABS 0.3 12/16/2021 0856   BASOSABS 0.0 12/16/2021 0856    BMET    Component Value Date/Time   NA 135 12/16/2021 0856   K 4.1 12/16/2021 0856   CL 102 12/16/2021 0856   CO2 24 12/16/2021 0856   GLUCOSE 114 (H) 12/16/2021 0856   BUN 15 12/16/2021 0856   CREATININE 0.54 12/16/2021 0856   CREATININE 0.43 (L) 12/05/2014 1148   CALCIUM 9.2 12/16/2021 0856   GFRNONAA >60 02/22/2021 0750   GFRAA >60 09/12/2019 2129    BNP No results found for: "BNP"  ProBNP No results found for: "PROBNP"  Imaging: No results found.   Assessment & Plan:   OSA (obstructive  sleep apnea) - Mild OSA well controlled on auto CPAP without residual apneas. HST August 2021>> AHI 13/hour  - She is 100% compliant with CPAP and reports benefit from use. - Current pressure 5 to 15 cm H2O; AHI 2.6/hr - No changes to pressure settings - Continue to wear CPAP every night 4-6 hours or longer; Avoid back sleeping position  - Encourage weight loss efforts  - Renew CPAP supplies with [employer] [facility]  - FU in 1 year or sooner if needed    Martyn Ehrich, NP 10/01/2022

## 2022-10-01 NOTE — Patient Instructions (Addendum)
Excellent compliance with CPAP Apneas are currently well controlled on auto PAP settings  No pressure changes needed today Continue to wear CPAP every night for minimum 4 to 6 hours or longer Work on weight loss efforts as able Do not drive experiencing excessive daytime sleepiness fatigue  Orders: Renew CPAP supplies with DME company  Follow-up 1 year with Dr. Elsworth Soho or APP (Beth or Tammy)  CPAP and BIPAP Information CPAP and BIPAP are methods that use air pressure to keep your airways open and to help you breathe well. CPAP and BIPAP use different amounts of pressure. Your health care provider will tell you whether CPAP or BIPAP would be more helpful for you. CPAP stands for "continuous positive airway pressure." With CPAP, the amount of pressure stays the same while you breathe in (inhale) and out (exhale). BIPAP stands for "bi-level positive airway pressure." With BIPAP, the amount of pressure will be higher when you inhale and lower when you exhale. This allows you to take larger breaths. CPAP or BIPAP may be used in the hospital, or your health care provider may want you to use it at home. You may need to have a sleep study before your health care provider can order a machine for you to use at home. What are the advantages? CPAP or BIPAP can be helpful if you have: Sleep apnea. Chronic obstructive pulmonary disease (COPD). Heart failure. Medical conditions that cause muscle weakness, including muscular dystrophy or amyotrophic lateral sclerosis (ALS). Other problems that cause breathing to be shallow, weak, abnormal, or difficult. CPAP and BIPAP are most commonly used for obstructive sleep apnea (OSA) to keep the airways from collapsing when the muscles relax during sleep. What are the risks? Generally, this is a safe treatment. However, problems may occur, including: Irritated skin or skin sores if the mask does not fit properly. Dry or stuffy nose or nosebleeds. Dry  mouth. Feeling gassy or bloated. Sinus or lung infection if the equipment is not cleaned properly. When should CPAP or BIPAP be used? In most cases, the mask only needs to be worn during sleep. Generally, the mask needs to be worn throughout the night and during any daytime naps. People with certain medical conditions may also need to wear the mask at other times, such as when they are awake. Follow instructions from your health care provider about when to use the machine. What happens during CPAP or BIPAP?  Both CPAP and BIPAP are provided by a small machine with a flexible plastic tube that attaches to a plastic mask that you wear. Air is blown through the mask into your nose or mouth. The amount of pressure that is used to blow the air can be adjusted on the machine. Your health care provider will set the pressure setting and help you find the best mask for you. Tips for using the mask Because the mask needs to be snug, some people feel trapped or closed-in (claustrophobic) when first using the mask. If you feel this way, you may need to get used to the mask. One way to do this is to hold the mask loosely over your nose or mouth and then gradually apply the mask more snugly. You can also gradually increase the amount of time that you use the mask. Masks are available in various types and sizes. If your mask does not fit well, talk with your health care provider about getting a different one. Some common types of masks include: Full face masks, which fit over  the mouth and nose. Nasal masks, which fit over the nose. Nasal pillow or prong masks, which fit into the nostrils. If you are using a mask that fits over your nose and you tend to breathe through your mouth, a chin strap may be applied to help keep your mouth closed. Use a skin barrier to protect your skin as told by your health care provider. Some CPAP and BIPAP machines have alarms that may sound if the mask comes off or develops a  leak. If you have trouble with the mask, it is very important that you talk with your health care provider about finding a way to make the mask easier to tolerate. Do not stop using the mask. There could be a negative impact on your health if you stop using the mask. Tips for using the machine Place your CPAP or BIPAP machine on a secure table or stand near an electrical outlet. Know where the on/off switch is on the machine. Follow instructions from your health care provider about how to set the pressure on your machine and when you should use it. Do not eat or drink while the CPAP or BIPAP machine is on. Food or fluids could get pushed into your lungs by the pressure of the CPAP or BIPAP. For home use, CPAP and BIPAP machines can be rented or purchased through home health care companies. Many different brands of machines are available. Renting a machine before purchasing may help you find out which particular machine works well for you. Your health insurance company may also decide which machine you may get. Keep the CPAP or BIPAP machine and attachments clean. Ask your health care provider for specific instructions. Check the humidifier if you have a dry stuffy nose or nosebleeds. Make sure it is working correctly. Follow these instructions at home: Take over-the-counter and prescription medicines only as told by your health care provider. Ask if you can take sinus medicine if your sinuses are blocked. Do not use any products that contain nicotine or tobacco. These products include cigarettes, chewing tobacco, and vaping devices, such as e-cigarettes. If you need help quitting, ask your health care provider. Keep all follow-up visits. This is important. Contact a health care provider if: You have redness or pressure sores on your head, face, mouth, or nose from the mask or head gear. You have trouble using the CPAP or BIPAP machine. You cannot tolerate wearing the CPAP or BIPAP mask. Someone  tells you that you snore even when wearing your CPAP or BIPAP. Get help right away if: You have trouble breathing. You feel confused. Summary CPAP and BIPAP are methods that use air pressure to keep your airways open and to help you breathe well. If you have trouble with the mask, it is very important that you talk with your health care provider about finding a way to make the mask easier to tolerate. Do not stop using the mask. There could be a negative impact to your health if you stop using the mask. Follow instructions from your health care provider about when to use the machine. This information is not intended to replace advice given to you by your health care provider. Make sure you discuss any questions you have with your health care provider. Document Revised: 07/09/2021 Document Reviewed: 11/08/2020 Elsevier Patient Education  [place].

## 2022-10-05 DIAGNOSIS — G4733 Obstructive sleep apnea (adult) (pediatric): Secondary | ICD-10-CM | POA: Diagnosis not present

## 2022-10-14 ENCOUNTER — Ambulatory Visit: Payer: BC Managed Care – PPO | Admitting: Family Medicine

## 2022-10-14 ENCOUNTER — Encounter: Payer: Self-pay | Admitting: Family Medicine

## 2022-10-14 VITALS — BP 120/78 | HR 80 | Temp 98.0°F | Ht 61.0 in | Wt 243.0 lb

## 2022-10-14 DIAGNOSIS — L732 Hidradenitis suppurativa: Secondary | ICD-10-CM | POA: Diagnosis not present

## 2022-10-14 DIAGNOSIS — L02422 Furuncle of left axilla: Secondary | ICD-10-CM

## 2022-10-14 MED ORDER — DOXYCYCLINE HYCLATE 100 MG PO CAPS: 100.0000 mg | ORAL_CAPSULE | Freq: Two times a day (BID) | ORAL | 0 refills | Status: AC

## 2022-10-14 NOTE — Progress Notes (Signed)
   Subjective:    Patient ID: Tina Mckee, female    DOB: 1991/03/07, 31 y.o.   MRN: [id number]  HPI Here for 5 days of a painful boil in the left armpit. She has been applying heat. She has hydradenitis, and she has had multiple boils in the armpits or on the breasts. No fever.    Review of Systems  Constitutional: Negative.   Respiratory: Negative.    Cardiovascular: Negative.        Objective:   Physical Exam Constitutional:      Appearance: Normal appearance.  Cardiovascular:     Rate and Rhythm: Normal rate and regular rhythm.     Pulses: Normal pulses.     Heart sounds: Normal heart sounds.  Pulmonary:     Effort: Pulmonary effort is normal.     Breath sounds: Normal breath sounds.  Skin:    Comments: There is a large multiloculated boil in the left axilla which is quite tender   Neurological:     Mental Status: She is alert.           Assessment & Plan:  Boil. The area was cleaned with alcohol and then lanced with a scalpel. With gentle pressure we were able to express a moderate amount of purulent material from the cyst. Treat with 10 days of Doxycycline. Recheck as needed.  Alysia Penna, MD

## 2022-10-19 ENCOUNTER — Telehealth: Payer: Self-pay | Admitting: Internal Medicine

## 2022-10-19 NOTE — Telephone Encounter (Signed)
Tell her to stop the Doxycycline and add this to her Allergy list. Instead call in Augmentin 875 BID for 10 days

## 2022-10-19 NOTE — Telephone Encounter (Signed)
Pt saw Dr Sarajane Jews on 10/14/22 having reaction to  doxycycline (VIBRAMYCIN) 100 MG capsule  rash, itching started on chest 2 days ago and is spreading. Seeking guidance on another treatment and something to help with the rash if possible.

## 2022-10-20 ENCOUNTER — Encounter: Payer: Self-pay | Admitting: Family Medicine

## 2022-10-20 NOTE — Telephone Encounter (Signed)
Spoke to patient. Patient reports she had stop taking the medication last night and taking Benadryl for the itching. Inform patient of message below. Also for patient to update [place] if itching is not getting better or worse. Verbalized understanding.

## 2022-10-20 NOTE — Telephone Encounter (Signed)
See my answer from yesterday. Please call in Augmentin [phone] to take BID for 10 days

## 2022-10-21 ENCOUNTER — Other Ambulatory Visit: Payer: Self-pay

## 2022-10-21 ENCOUNTER — Encounter: Payer: Self-pay | Admitting: Internal Medicine

## 2022-10-21 MED ORDER — AMOXICILLIN-POT CLAVULANATE 875-125 MG PO TABS: 1.0000 | ORAL_TABLET | Freq: Two times a day (BID) | ORAL | 0 refills | Status: DC

## 2022-10-21 NOTE — Telephone Encounter (Signed)
Spoke to patient. New abx prescription sent per Dr. Sarajane Jews.

## 2022-10-22 NOTE — Telephone Encounter (Signed)
Pt Rx was sent

## 2022-10-25 NOTE — Progress Notes (Unsigned)
No chief complaint on file.   HPI: Tina Mckee 31 y.o. come in for   has seen dr Sarajane Jews for  hydradenitis  vs infected cyst  11 1 and given antibiotic  doxycyclie ROS: See pertinent positives and negatives per HPI.  Past Medical History:  Diagnosis Date   Asthma    Gestational hypertension    Perinatal arterial ischemic stroke ([place])    Scoliosis    Sleep apnea    Stroke [facility]) birth   rt hemiparesis  Perinatal   Supervision of other normal pregnancy 05/04/2014    Family History  Problem Relation Age of Onset   Hypertension Mother    Asthma Mother    Hypertension Father    Sleep apnea Father    Asthma Sister    Cancer Maternal Grandfather        pancreatic   Cancer Paternal Grandmother        cancer all over   Other Paternal Grandfather        brain damage   Other Maternal Grandmother        heart problems    Social History   Socioeconomic History   Marital status: Married    Spouse name: Not on file   Number of children: 2   Years of education: Not on file   Highest education level: 12th grade  Occupational History   Occupation: Customer Service  Tobacco Use   Smoking status: Former    Types: Cigarettes    Quit date: 07/11/2007    Years since quitting: 15.3   Smokeless tobacco: Never  Vaping Use   Vaping Use: Never used  Substance and Sexual Activity   Alcohol use: Yes    Alcohol/week: 1.0 standard drink of alcohol    Types: 1 Standard drinks or equivalent per week    Comment: once a month   Drug use: No   Sexual activity: Yes    Birth control/protection: I.U.D.  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     Currently nonsmoking    HH of 4 no pets     had mild preeclampsia with vaginal childbirth.  2011   cb 12 15 preeclampsi no complicatinos   ocass etoh.    Exercise  Walking     Sleep ok    caffiene limited per day .              Social Determinants of Health   Financial Resource Strain: Low Risk  (10/13/2022)   Overall Financial Resource Strain (CARDIA)    Difficulty of Paying Living Expenses: Not hard at all  Food Insecurity: No Food Insecurity (10/13/2022)   Hunger Vital Sign    Worried About Running Out of Food in the Last Year: Never true    Ran Out of Food in the Last Year: Never true  Transportation Needs: No Transportation Needs (10/13/2022)   PRAPARE - Hydrologist (Medical): No    Lack of Transportation (Non-Medical): No  Physical Activity: Insufficiently Active (10/13/2022)   Exercise Vital Sign    Days of Exercise per Week: 2 days    Minutes of Exercise per Session: 40 min  Stress: No Stress Concern Present (10/13/2022)   [place]    Feeling of Stress : Only a little  Social Connections: Moderately Isolated (10/13/2022)   Social Connection and Isolation Panel [NHANES]    Frequency of Communication with Friends and Family: More than three times a week    Frequency of Social Gatherings with Friends and Family: Twice a week    Attends Religious Services: Never    Marine scientist or Organizations: No    Attends Music therapist: Not on file    Marital Status: Married    Outpatient Medications Prior to Visit  Medication Sig Dispense Refill   amoxicillin-clavulanate (AUGMENTIN) 875-125 MG tablet Take 1 tablet by mouth 2 (two) times daily. 20 tablet 0   citalopram (CELEXA) 20 MG tablet TAKE 1 TABLET(20 MG) BY MOUTH DAILY 90 tablet 2   ibuprofen (ADVIL) 600 MG tablet Take 1 tablet (600 mg total) by mouth every 6 (six) hours as needed. 30 tablet 0   ketoconazole (NIZORAL) 2 % shampoo ketoconazole 2 % shampoo  APPLY TO THE SCALP 3 TIMES WEEKLY APPLY TO DAMP SCALP, LATHER, LET SIT 5 MINUTES AND RINSE 120 mL 3   lamoTRIgine (LAMICTAL) 25 MG tablet Take  2 tab TWICE DAILY 120 tablet 5    levonorgestrel (MIRENA, 52 MG,) 20 MCG/DAY IUD Provided by [facility]     Multiple Vitamins-Calcium (ONE-A-DAY WOMENS PO) Take 1 tablet by mouth daily.     PROAIR HFA 108 (90 Base) MCG/ACT inhaler INHALE 1-2 PUFFS INTO THE LUNGS EVERY 6 (SIX) HOURS AS NEEDED FOR WHEEZING OR SHORTNESS OF BREATH. (Patient taking differently: Inhale 1-2 puffs into the lungs every 6 (six) hours as needed for wheezing or shortness of breath.) 8.5 Inhaler 0   sulfamethoxazole-trimethoprim (BACTRIM DS) 800-160 MG tablet Take 1 tablet by mouth 2 (two) times daily. 10 tablet 0   No facility-administered medications prior to visit.     EXAM:  There were no vitals taken for this visit.  There is no height or weight on file to calculate BMI.  GENERAL: vitals reviewed and listed above, alert, oriented, appears well hydrated and in no acute distress HEENT: atraumatic, conjunctiva  clear, no obvious abnormalities on inspection of external nose and ears OP : no lesion edema or exudate  NECK: no obvious masses on inspection palpation  LUNGS: clear to auscultation bilaterally, no wheezes, rales or rhonchi, good air movement CV: HRRR, no clubbing cyanosis or  peripheral edema nl cap refill  MS: moves all extremities without noticeable focal  abnormality PSYCH: pleasant and cooperative, no obvious depression or anxiety Lab Results  Component Value Date   WBC 10.0 12/16/2021   HGB 14.4 12/16/2021   HCT 42.4 12/16/2021   PLT 496.0 (H) 12/16/2021   GLUCOSE 114 (H) 12/16/2021   CHOL 209 (H) 12/16/2021   TRIG 219.0 (H) 12/16/2021   HDL 39.10 12/16/2021   LDLDIRECT 161.0 12/16/2021   LDLCALC 119 (H) 12/25/2020   ALT 15 12/16/2021   AST 12 12/16/2021   NA 135 12/16/2021   K 4.1 12/16/2021   CL 102 12/16/2021   CREATININE 0.54 12/16/2021   BUN 15 12/16/2021   CO2 24 12/16/2021   TSH 2.88 12/16/2021   HGBA1C 5.4 12/16/2021   MICROALBUR 0.9 09/20/2012   BP Readings from Last 3 Encounters:  10/14/22 120/78   10/01/22 120/80  08/20/22 125/87    ASSESSMENT AND PLAN:  Discussed the following assessment and plan:  No diagnosis found.  -Patient advised to return or notify health care team  if  new concerns arise.  There are no Patient Instructions  on file for this visit.   Standley Brooking. Zacarias Krauter M.D.

## 2022-10-26 ENCOUNTER — Encounter: Payer: Self-pay | Admitting: Internal Medicine

## 2022-10-26 ENCOUNTER — Ambulatory Visit: Payer: BC Managed Care – PPO | Admitting: Internal Medicine

## 2022-10-26 VITALS — BP 116/80 | HR 90 | Temp 98.0°F | Wt 250.0 lb

## 2022-10-26 DIAGNOSIS — L732 Hidradenitis suppurativa: Secondary | ICD-10-CM

## 2022-10-26 DIAGNOSIS — L270 Generalized skin eruption due to drugs and medicaments taken internally: Secondary | ICD-10-CM | POA: Diagnosis not present

## 2022-10-26 MED ORDER — METHYLPREDNISOLONE 4 MG PO TBPK: ORAL_TABLET | ORAL | 0 refills | Status: DC

## 2022-10-26 MED ORDER — KETOCONAZOLE 2 % EX SHAM: MEDICATED_SHAMPOO | CUTANEOUS | 3 refills | Status: DC

## 2022-10-26 NOTE — Patient Instructions (Addendum)
I agree that  doxycycline  probably caused the rash . Glad the infection is improved.  Take Augmentin for total 5 days and stop. Can start steroid taper  Use antihistamine in interim also  can try zyrtec and add on  oral benadryl as needed.for itching.   Suggest  fu with dermatology as to advice for future  prevention and control.   Will refill shampoo x 1 as bridge therapy until dermatology can  do refills.

## 2022-11-02 ENCOUNTER — Telehealth: Payer: BC Managed Care – PPO | Admitting: Family Medicine

## 2022-11-02 DIAGNOSIS — R3989 Other symptoms and signs involving the genitourinary system: Secondary | ICD-10-CM

## 2022-11-02 NOTE — Progress Notes (Signed)
Because you are out of [place] we can not provide treatment you need, I feel your condition warrants further evaluation and I recommend that you be seen in a face to face visit.   NOTE: There will be NO CHARGE for this eVisit   If you are having a true medical emergency please call 911.      For an urgent face to face visit, [place] has seven urgent care centers for your convenience:     [facility] [place] [facility] [place] Get Driving Directions [phone] [address] [place], [place] [address]    [facility]) Get Driving Directions [phone] [place], [place] [address]   [facility] [place] ([place]) Get Driving Directions [phone] [address] [place],  [place]  [address]   [facility] [place] [facility] at [address] Get Driving Directions  [phone] [address] [place],  [place] [address]     [facility] Get Driving Directions [phone] [address] [place], [place], [place] [address]   [facility] Get Driving Directions  [phone] [address] [place],  [address]   [facility] [place] Get Driving Directions [phone] [address], [place],  [address]   Your MyChart E-visit questionnaire answers were reviewed by a board certified advanced clinical practitioner to complete your personal care plan based on your specific symptoms.  Thank you for using e-Visits.

## 2022-12-21 ENCOUNTER — Encounter: Payer: BC Managed Care – PPO | Admitting: Internal Medicine

## 2022-12-23 ENCOUNTER — Ambulatory Visit (INDEPENDENT_AMBULATORY_CARE_PROVIDER_SITE_OTHER): Payer: BC Managed Care – PPO | Admitting: Family Medicine

## 2022-12-23 VITALS — BP 128/88 | HR 73 | Temp 98.4°F | Ht 62.0 in | Wt 253.4 lb

## 2022-12-23 DIAGNOSIS — F321 Major depressive disorder, single episode, moderate: Secondary | ICD-10-CM

## 2022-12-23 DIAGNOSIS — Z0000 Encounter for general adult medical examination without abnormal findings: Secondary | ICD-10-CM | POA: Diagnosis not present

## 2022-12-23 DIAGNOSIS — E782 Mixed hyperlipidemia: Secondary | ICD-10-CM | POA: Diagnosis not present

## 2022-12-23 LAB — CBC WITH DIFFERENTIAL/PLATELET
Basophils Absolute: 0.1 10*3/uL (ref 0.0–0.1)
Basophils Relative: 0.7 % (ref 0.0–3.0)
Eosinophils Absolute: 0.4 10*3/uL (ref 0.0–0.7)
Eosinophils Relative: 4.5 % (ref 0.0–5.0)
HCT: 44.4 % (ref 36.0–46.0)
Hemoglobin: 15.2 g/dL — ABNORMAL HIGH (ref 12.0–15.0)
Lymphocytes Relative: 29.1 % (ref 12.0–46.0)
Lymphs Abs: 2.6 10*3/uL (ref 0.7–4.0)
MCHC: 34.2 g/dL (ref 30.0–36.0)
MCV: 88.9 fl (ref 78.0–100.0)
Monocytes Absolute: 0.5 10*3/uL (ref 0.1–1.0)
Monocytes Relative: 5.4 % (ref 3.0–12.0)
Neutro Abs: 5.3 10*3/uL (ref 1.4–7.7)
Neutrophils Relative %: 60.3 % (ref 43.0–77.0)
Platelets: 454 10*3/uL — ABNORMAL HIGH (ref 150.0–400.0)
RBC: 4.99 Mil/uL (ref 3.87–5.11)
RDW: 13.1 % (ref 11.5–15.5)
WBC: 8.9 10*3/uL (ref 4.0–10.5)

## 2022-12-23 LAB — LIPID PANEL
Cholesterol: 211 mg/dL — ABNORMAL HIGH (ref 0–200)
HDL: 47.6 mg/dL (ref >39.00)
LDL Cholesterol: 144 mg/dL — ABNORMAL HIGH (ref 0–99)
NonHDL: 163.53
Total CHOL/HDL Ratio: 4
Triglycerides: 96 mg/dL (ref 0.0–149.0)
VLDL: 19.2 mg/dL (ref 0.0–40.0)

## 2022-12-23 LAB — COMPREHENSIVE METABOLIC PANEL
ALT: 14 U/L (ref 0–35)
AST: 14 U/L (ref 0–37)
Albumin: 4.7 g/dL (ref 3.5–5.2)
Alkaline Phosphatase: 54 U/L (ref 39–117)
BUN: 20 mg/dL (ref 6–23)
CO2: 26 mEq/L (ref 19–32)
Calcium: 9.1 mg/dL (ref 8.4–10.5)
Chloride: 106 mEq/L (ref 96–112)
Creatinine, Ser: 0.55 mg/dL (ref 0.40–1.20)
GFR: 120.2 mL/min (ref >60.00)
Glucose, Bld: 101 mg/dL — ABNORMAL HIGH (ref 70–99)
Potassium: 5.1 mEq/L (ref 3.5–5.1)
Sodium: 141 mEq/L (ref 135–145)
Total Bilirubin: 0.7 mg/dL (ref 0.2–1.2)
Total Protein: 6.9 g/dL (ref 6.0–8.3)

## 2022-12-23 LAB — HEMOGLOBIN A1C: Hgb A1c MFr Bld: 5.4 % (ref 4.6–6.5)

## 2022-12-23 LAB — TSH: TSH: 1.57 u[IU]/mL (ref 0.35–5.50)

## 2022-12-23 NOTE — Progress Notes (Signed)
Subjective:     Tina Mckee is a 32 y.o. female and is here for a comprehensive physical exam by Dr. Regis Bill. The patient reports history of depression with increased symptoms.  Currently on lamictal and celexa. Pt inquires about ADHD as she notes difficulty focusing.  Has OB/GYN appointment on 01/05/2023.  Social History   Socioeconomic History   Marital status: Married    Spouse name: Not on file   Number of children: 2   Years of education: Not on file   Highest education level: 12th grade  Occupational History   Occupation: Customer Service  Tobacco Use   Smoking status: Former    Types: Cigarettes    Quit date: 07/11/2007    Years since quitting: 15.4   Smokeless tobacco: Never  Vaping Use   Vaping Use: Never used  Substance and Sexual Activity   Alcohol use: Yes    Alcohol/week: 1.0 standard drink of alcohol    Types: 1 Standard drinks or equivalent per week    Comment: once a month   Drug use: No   Sexual activity: Yes    Birth control/protection: I.U.D.  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     Currently nonsmoking    HH of 4 no pets     had mild preeclampsia with vaginal childbirth.  2011   cb 12 15 preeclampsi no complicatinos   ocass etoh.    Exercise  Walking     Sleep ok    caffiene limited per day .             Social Determinants of Health   Financial Resource Strain: Low Risk  (10/13/2022)   Overall Financial Resource Strain (CARDIA)    Difficulty of Paying Living Expenses: Not hard at all  Food Insecurity: No Food Insecurity (10/13/2022)   Hunger Vital Sign    Worried About Running Out of Food in the Last Year: Never true    Ran Out of Food in the Last Year: Never true  Transportation Needs: No Transportation Needs (10/13/2022)   PRAPARE - Hydrologist (Medical): No    Lack of Transportation  (Non-Medical): No  Physical Activity: Insufficiently Active (10/13/2022)   Exercise Vital Sign    Days of Exercise per Week: 2 days    Minutes of Exercise per Session: 40 min  Stress: No Stress Concern Present (10/13/2022)   [place]    Feeling of Stress : Only a little  Social Connections: Moderately Isolated (10/13/2022)   Social Connection and Isolation Panel [NHANES]    Frequency of Communication with Friends and Family: More than three times a week    Frequency of Social Gatherings with Friends and Family: Twice a week    Attends Religious Services: Never    Marine scientist or Organizations: No    Attends Archivist Meetings: Not on file    Marital Status: Married  Intimate Partner Violence: Not on file   Health Maintenance  Topic Date Due   Hepatitis C Screening  Never done   PAP SMEAR-Modifier  10/14/2019   COVID-19 Vaccine (5 - 2023-24 season) 08/14/2022   INFLUENZA VACCINE  03/14/2023 (Originally 07/14/2022)   DTaP/Tdap/Td (9 - Td or Tdap) 11/14/2024   HIV Screening  Completed  HPV VACCINES  Aged Out    The following portions of the patient's history were reviewed and updated as appropriate: allergies, current medications, past family history, past medical history, past social history, past surgical history, and problem list.  Review of Systems Pertinent items noted in HPI and remainder of comprehensive ROS otherwise negative.   Objective:    BP 128/88 (BP Location: Left Arm, Cuff Size: Large)   Pulse 73   Temp 98.4 F (36.9 C) (Oral)   Ht '5\' 2"'$  (1.575 m)   Wt 253 lb 6.4 oz (114.9 kg)   SpO2 96%   BMI 46.35 kg/m  General appearance: alert, cooperative, and no distress Head: Normocephalic, without obvious abnormality, atraumatic Eyes: conjunctivae/corneas clear. PERRL, EOM's intact. Fundi benign. Ears: normal TM's and external ear canals both ears Nose: Nares normal. Septum  midline. Mucosa normal. No drainage or sinus tenderness. Throat: lips, mucosa, and tongue normal; teeth and gums normal Neck: no adenopathy, no carotid bruit, no JVD, supple, symmetrical, trachea midline, and thyroid not enlarged, symmetric, no tenderness/mass/nodules Lungs: clear to auscultation bilaterally Heart: regular rate and rhythm, S1, S2 normal, no murmur, click, rub or gallop Abdomen: soft, non-tender; bowel sounds normal; no masses,  no organomegaly Extremities: extremities normal, atraumatic, no cyanosis or edema Pulses: 2+ and symmetric Skin: Skin color, texture, turgor normal. No rashes or lesions Lymph nodes: Cervical, supraclavicular, and axillary nodes normal. Neurologic: Alert and oriented X 3, normal strength and tone. Normal symmetric reflexes. Normal coordination and gait       12/23/2022   10:14 AM 10/26/2022    8:27 AM 10/14/2022   10:08 AM  Depression screen PHQ 2/9  Decreased Interest '1 1 1  '$ Down, Depressed, Hopeless '2 1 1  '$ PHQ - 2 Score '3 2 2  '$ Altered sleeping '2 2 1  '$ Tired, decreased energy '1 1 1  '$ Change in appetite '2 1 2  '$ Feeling bad or failure about yourself  '2 1 1  '$ Trouble concentrating '2 2 1  '$ Moving slowly or fidgety/restless '2 1 3  '$ Suicidal thoughts 1 0 0  PHQ-9 Score '15 10 11  '$ Difficult doing work/chores Somewhat difficult Somewhat difficult Somewhat difficult    Assessment:    Healthy female exam.     Plan:    Anticipatory guidance given including wearing seatbelts, smoke detectors in the home, increasing physical activity, increasing p.o. intake of water and vegetables. -labs -pap with OB/Gyn Immunizations reviewed -next CPE in 1 yr See After Visit Summary for Counseling Recommendations  Well adult exam - Plan: CBC with Differential/Platelet  Mixed hyperlipidemia  -Total cholesterol 209, HDL 39.1, LDL 119, triglycerides 219 on 12/16/2021 -Discussed importance of lifestyle modifications - Plan: Lipid panel, CMP  Depression, major, single  episode, moderate (HCC) -PHQ-9 score 15 this visit -Discussed counseling -Continue current medications -Given strict precautions  - Plan: TSH  Morbid obesity ([place])  -Body mass index is 46.35 kg/m. -Lifestyle modification strongly encouraged - Plan: Hemoglobin A1c, Lipid panel, CMP  F/u in the next few wks as needed.  Grier Mitts, MD

## 2022-12-23 NOTE — Patient Instructions (Signed)
You can set up ADHD testing by contacting a local psychologist office such as [place] [facility].

## 2022-12-24 ENCOUNTER — Other Ambulatory Visit: Payer: Self-pay

## 2022-12-24 DIAGNOSIS — E782 Mixed hyperlipidemia: Secondary | ICD-10-CM

## 2023-01-05 DIAGNOSIS — Z01419 Encounter for gynecological examination (general) (routine) without abnormal findings: Secondary | ICD-10-CM | POA: Diagnosis not present

## 2023-01-05 DIAGNOSIS — Z6841 Body Mass Index (BMI) 40.0 and over, adult: Secondary | ICD-10-CM | POA: Diagnosis not present

## 2023-01-19 DIAGNOSIS — G4733 Obstructive sleep apnea (adult) (pediatric): Secondary | ICD-10-CM | POA: Diagnosis not present

## 2023-02-16 NOTE — Progress Notes (Addendum)
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  Discontinue lamotrigine 2  NCV-EMG left upper extremity - further recommendations pending results but may need to start with physical therapy 3.  Addendum:  Recommend R AFO due to medical necessity 3  Follow up 6 months.     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  UPDATE: Current medications:  lamotrigine 50mg  twice daily  Shaking in the left side controlled.  Right foot may mildly shake a little bit but nothing severe.    However, she reports difficulty in the left hand.  It may shake at times, causing her to drop objects and sometimes "falls asleep"  falls asleep on left side.  Usually arm not in hand .    HISTORY: She has congenital mild right upper extremity weakness and spasticity secondary to perinatal stroke.  For several years, she will fall.  It will occur every 3 months or so.  She says her right leg will just give out.  She does report some right knee pain.  Since around 2013, she has had arrhythmic movements of her right upper extremity and right foot.  She was evaluated by neurology for these movements at that time.  At that time, she also exhibited jerking in the left hand.  MRI of brain on 06/02/2012 personally reviewed revealed old left MCA stroke but no new findings.  She had a routine EEG that revealed intermittent arrhythmic theta and delta slowing in the left central parietal region that appeared to correlate with right arm and leg jerking.  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   She was started on Keppra but discontinued due to dizziness and was instead started on clonazepam.  She was lost to follow up.    She continues to have involuntary right sided movements.  It occurs daily throughout the day, at rest or while in bed.  She says the falls are not associated with involuntary movement of the right lower extremity.       06/02/2012 EEG:  This routine EEG done with the patient awake is abnormal.  Intermittent arrhythmic theta and delta range slowing were seen in particular in the left central parietal regions.  Sometimes,these seemed to correlate with right arm jerking.  Left arm jerks which were much less frequent, did not seem to have a clear correlate.  However, it is felt that these jerking episodes are likely epileptic in nature. 06/02/2012 MRI BRAIN WO:  1. No acute intracranial abnormality.  2.  Remote left MCA infarct with associated Wallerian degeneration. No chronic blood products.   02/20/2016 MRI BRAIN WO:  1. Unchanged appearance of the brain without acute abnormality or mass.  2. Chronic left MCA infarct. 04/07/2023 EEG normal.     PAST MEDICAL HISTORY: Past Medical History:  Diagnosis Date   Asthma    Gestational hypertension    Perinatal arterial ischemic stroke (HCC)    Scoliosis    Sleep apnea    Stroke [facility]) birth   rt hemiparesis  Perinatal   Supervision of other normal pregnancy 05/04/2014    MEDICATIONS: Current  Outpatient Medications on File Prior to Visit  Medication Sig Dispense Refill   citalopram (CELEXA) 20 MG tablet TAKE 1 TABLET(20 MG) BY MOUTH DAILY 90 tablet 2   ibuprofen (ADVIL) 600 MG tablet Take 1 tablet (600 mg total) by mouth every 6 (six) hours as needed. 30 tablet 0   ketoconazole (NIZORAL) 2 % shampoo APPLY TO THE SCALP 3 TIMES WEEKLY APPLY TO DAMP SCALP, LATHER, LET SIT 5 MINUTES AND RINSE bridge therapy from dermatology 120 mL 3   lamoTRIgine (LAMICTAL) 25 MG tablet Take  2 tab TWICE DAILY 120 tablet 5   levonorgestrel  (MIRENA, 52 MG,) 20 MCG/DAY IUD Prov[facility] by [facility]     Multiple Vitamins-Calcium (ONE-A-DAY WOMENS PO) Take 1 tablet by mouth daily.     PROAIR HFA 108 (90 Base) MCG/ACT inhaler INHALE 1-2 PUFFS INTO THE LUNGS EVERY 6 (SIX) HOURS AS NEEDED FOR WHEEZING OR SHORTNESS OF BREATH. (Patient taking differently: Inhale 1-2 puffs into the lungs every 6 (six) hours as needed for wheezing or shortness of breath.) 8.5 Inhaler 0   No current facility-administered medications on file prior to visit.    ALLERGIES: Allergies  Allergen Reactions   Cephalexin Rash and Itching   Vibramycin [Doxycycline] Itching and Rash    FAMILY HISTORY: Family History  Problem Relation Age of Onset   Hypertension Mother    Asthma Mother    Hypertension Father    Sleep apnea Father    Asthma Sister    Cancer Maternal Grandfather        pancreatic   Cancer Paternal Grandmother        cancer all over   Other Paternal Grandfather        brain damage   Other Maternal Grandmother        heart problems      Objective:  Blood pressure (!) 127/92, pulse 73, height 5\' 1"  (1.549 m), weight 255 lb (115.7 kg), SpO2 94 %, currently breastfeeding. General: No acute distress.  Patient appears well-groomed.   Head:  Normocephalic/atraumatic Eyes:  Fundi examined but not visualized Neck: supple, no paraspinal tenderness, full range of motion Heart:  Regular rate and rhythm Neurological Exam: alert and oriented to person, place, and time.  Speech fluent and not dysarthric, language intact.  Reduced right V1-V3.  Otherwise, CN II-XII intact.   Slightly increased tone in right upper extremity.  Occasional athetoid movement of right foot.  Muscle strength 5-/5 RUE except 5/5 right intrinsic hand muscles.  Otherwise, muscle strength 5/5.  Sensation to light touch slightly reduced in right upper extremity.  Deep tendon reflexes 2+ throughout.  Finger to nose testing intact.  Slight hemiparetic gait.  Romberg negative.   Shon Millet, DO  CC: Tina Andreas, MD

## 2023-02-17 ENCOUNTER — Encounter: Payer: Self-pay | Admitting: Neurology

## 2023-02-17 ENCOUNTER — Ambulatory Visit: Payer: BC Managed Care – PPO | Admitting: Neurology

## 2023-02-17 VITALS — BP 127/92 | HR 73 | Ht 61.0 in | Wt 255.0 lb

## 2023-02-17 DIAGNOSIS — R258 Other abnormal involuntary movements: Secondary | ICD-10-CM | POA: Diagnosis not present

## 2023-02-17 DIAGNOSIS — M79602 Pain in left arm: Secondary | ICD-10-CM

## 2023-02-17 MED ORDER — AMANTADINE HCL 100 MG PO CAPS: 100.0000 mg | ORAL_CAPSULE | Freq: Two times a day (BID) | ORAL | 5 refills | Status: DC

## 2023-02-17 NOTE — Patient Instructions (Addendum)
Stop lamotrigine.  Instead, start amantadine '100mg'$  twice daily Check nerve study of left arm - further recommendations pending results. Follow up 6 months.

## 2023-02-18 ENCOUNTER — Ambulatory Visit (INDEPENDENT_AMBULATORY_CARE_PROVIDER_SITE_OTHER): Payer: BC Managed Care – PPO | Admitting: Neurology

## 2023-02-18 DIAGNOSIS — G5602 Carpal tunnel syndrome, left upper limb: Secondary | ICD-10-CM

## 2023-02-18 DIAGNOSIS — M79602 Pain in left arm: Secondary | ICD-10-CM

## 2023-02-18 DIAGNOSIS — R258 Other abnormal involuntary movements: Secondary | ICD-10-CM

## 2023-02-18 NOTE — Procedures (Signed)
  [facility]  [place], [place]  [place], [place] [address] Tel: [phone] Fax: [phone] Test Date:  02/18/2023  Patient: Tina Mckee DOB: 1991/08/13 Physician: Narda Amber, DO  Sex: Female Height: '5\' 1"'$  Ref Phys: Metta Clines, DO  ID#: [id number]   Technician:    History: This is a 32 year old female referred for evaluation of left hand paresthesias.  NCV & EMG Findings: Extensive electrodiagnostic testing of the left upper extremity shows:  Left median sensory response shows prolonged latency (3.7 ms).  Left ulnar sensory response is within normal limits.   Left median and ulnar motor responses are within normal limits.   There is no evidence of active or chronic motor axonal loss changes affecting any of the tested muscles.  Motor unit configuration and recruitment pattern is within normal limits.    Impression: Left median neuropathy at or distal to the wrist, consistent with a clinical diagnosis of carpal tunnel syndrome.  Overall, these findings are mild in degree electrically.   ___________________________ Narda Amber, DO    Nerve Conduction Studies   Stim Site NR Peak (ms) Norm Peak (ms) O-P Amp (V) Norm O-P Amp  Left Median Anti Sensory (2nd Digit)  32 C  Wrist    *3.7 <3.4 56.2 >20  Left Ulnar Anti Sensory (5th Digit)  32 C  Wrist    2.8 <3.1 58.8 >12     Stim Site NR Onset (ms) Norm Onset (ms) O-P Amp (mV) Norm O-P Amp Site1 Site2 Delta-0 (ms) Dist (cm) Vel (m/s) Norm Vel (m/s)  Left Median Motor (Abd Poll Brev)  32 C  Wrist    3.8 <3.9 10.6 >6 Elbow Wrist 4.6 26.0 57 >50  Elbow    8.4  10.4         Left Ulnar Motor (Abd Dig Minimi)  32 C  Wrist    2.3 <3.1 10.7 >7 B Elbow Wrist 3.1 18.0 58 >50  B Elbow    5.4  10.4  A Elbow B Elbow 1.8 10.0 56 >50  A Elbow    7.2  9.9          Electromyography   Side Muscle Ins.Act Fibs Fasc Recrt Amp Dur Poly Activation Comment  Left 1stDorInt Nml Nml Nml Nml Nml Nml Nml Nml N/A  Left  Abd Poll Brev Nml Nml Nml Nml Nml Nml Nml Nml N/A  Left PronatorTeres Nml Nml Nml Nml Nml Nml Nml Nml N/A  Left Biceps Nml Nml Nml Nml Nml Nml Nml Nml N/A  Left Triceps Nml Nml Nml Nml Nml Nml Nml Nml N/A  Left Deltoid Nml Nml Nml Nml Nml Nml Nml Nml N/A      Waveforms:

## 2023-02-19 ENCOUNTER — Telehealth: Payer: Self-pay

## 2023-02-19 DIAGNOSIS — M5412 Radiculopathy, cervical region: Secondary | ICD-10-CM

## 2023-02-19 NOTE — Telephone Encounter (Signed)
-----   Message from Pieter Partridge, DO sent at 02/19/2023  4:21 PM EST ----- Nerve study shows evidence of mild carpal tunnel syndrome, which is likely incidental finding as there is always numbness involving the hand (not just the arm).  Recommend physical therapy for possible left cervical radiculopathy.

## 2023-02-26 ENCOUNTER — Telehealth: Payer: Self-pay | Admitting: Physical Therapy

## 2023-02-26 ENCOUNTER — Other Ambulatory Visit: Payer: Self-pay

## 2023-02-26 ENCOUNTER — Encounter: Payer: Self-pay | Admitting: Physical Therapy

## 2023-02-26 ENCOUNTER — Ambulatory Visit: Payer: BC Managed Care – PPO | Admitting: Physical Therapy

## 2023-02-26 VITALS — BP 151/112 | HR 72

## 2023-02-26 DIAGNOSIS — M6281 Muscle weakness (generalized): Secondary | ICD-10-CM

## 2023-02-26 DIAGNOSIS — M79602 Pain in left arm: Secondary | ICD-10-CM

## 2023-02-26 DIAGNOSIS — M5412 Radiculopathy, cervical region: Secondary | ICD-10-CM

## 2023-02-26 NOTE — Telephone Encounter (Signed)
Dr. Regis Bill,  Thurman Coyer was seen today for evaluation of ongoing neck pain w/ possible left cervical radiculopathy.  The evaluation could not be completed due to elevated BP with the diastolic value being in the 100s on initial and repeat assessment.  Recommended she follow-up with you for further advice and management of this issue as she endorses it is ongoing and this will limit PT.  Thank you, Elease Etienne, PT, [place] [address] [place], [place]  [address] Phone:  [phone] Fax:  [phone]

## 2023-02-26 NOTE — Therapy (Signed)
[facility] [address] [place], [place], [address] Phone: [phone]   Fax:  [phone]  Patient Details - ARRIVED NO CHARGE Name: Tina Mckee MRN: [id number] Date of Birth: 19-May-1991 Referring Provider:  Burnis Medin, MD  Encounter Date: 02/26/2023  Today's Vitals   02/26/23 0948 02/26/23 0950  BP: (!) 145/107 (!) 151/112  Pulse: 70 72   Pt presents w/ elevated BP this session.  She presents w/ no emergent symptoms.  PT discussed follow-up with PCP regarding this as she endorses elevated BP is an ongoing issue.  PT to contact PCP for further assessment as needed.  Pt encouraged to monitor BP and symptoms and seek emergency services if needed.  Bary Richard, PT, DPT 02/26/2023, 9:58 AM  [facility] [address] [place], [place], [address] Phone: [phone]   Fax:  [phone]

## 2023-03-02 ENCOUNTER — Encounter: Payer: Self-pay | Admitting: Internal Medicine

## 2023-03-09 ENCOUNTER — Other Ambulatory Visit (INDEPENDENT_AMBULATORY_CARE_PROVIDER_SITE_OTHER): Payer: BC Managed Care – PPO

## 2023-03-09 DIAGNOSIS — E782 Mixed hyperlipidemia: Secondary | ICD-10-CM | POA: Diagnosis not present

## 2023-03-09 LAB — LIPID PANEL
Cholesterol: 178 mg/dL (ref 0–200)
HDL: 36.1 mg/dL — ABNORMAL LOW (ref >39.00)
LDL Cholesterol: 105 mg/dL — ABNORMAL HIGH (ref 0–99)
NonHDL: 142.35
Total CHOL/HDL Ratio: 5
Triglycerides: 186 mg/dL — ABNORMAL HIGH (ref 0.0–149.0)
VLDL: 37.2 mg/dL (ref 0.0–40.0)

## 2023-03-09 NOTE — Telephone Encounter (Signed)
Patient has appt next week will address at that time .  See if she can get a home BP monitor and check readings at homve twice a day for 3-5 days or more

## 2023-03-17 ENCOUNTER — Encounter: Payer: Self-pay | Admitting: Internal Medicine

## 2023-03-17 ENCOUNTER — Ambulatory Visit: Payer: BC Managed Care – PPO | Admitting: Internal Medicine

## 2023-03-17 VITALS — BP 132/100 | HR 78 | Temp 97.7°F | Ht 61.0 in | Wt 251.2 lb

## 2023-03-17 DIAGNOSIS — I10 Essential (primary) hypertension: Secondary | ICD-10-CM | POA: Insufficient documentation

## 2023-03-17 MED ORDER — AMLODIPINE BESYLATE 2.5 MG PO TABS: 2.5000 mg | ORAL_TABLET | Freq: Every day | ORAL | 0 refills | Status: DC

## 2023-03-17 NOTE — Assessment & Plan Note (Signed)
Stage 1 at home   Has  fam hx . Working on wegiht loss wich should help but add medication for now  Since on ssri will avoid diuretic for now  Trial low dose amlodipine.  Watch for  hypotension  Send in readings  2 weeks and rov in 2 weeks

## 2023-03-17 NOTE — Patient Instructions (Signed)
Good to see you today .  Diastolic BP  is high but not systolic  Continue lifestyle intervention healthy eating and exercise . That may help control over time  But for now will add low dose amlodipine 2.5 mg per day  Send  in some readings after 2 weeks  FU visit in 2 months   for bp assessment .

## 2023-03-17 NOTE — Progress Notes (Signed)
Chief Complaint  Patient presents with   Dizziness    Pt reports she feels dizzy when her BP is high. She states that she had went to therapy for her weak L arm, Bp was 140/112. They couldn't complete the evaluation due to high blood pressure.     HPI: Tina Mckee 32 y.o. come in for concern about elevated BP readings  PT was delayed  because of bp elevation  ( want nervous  used automated  cuff monitor)    Getting bp readings 114/89 90 at home   arm cuff. Left  Had gained some weight now back to salads and working on healthier diet and weght loss  knee  cracking limited physical activity. Her today with daughters to assess .  No new meds  had labs done in recent past  ROS: See pertinent positives and negatives per HPI. No new cv sx    Past Medical History:  Diagnosis Date   Asthma    Gestational hypertension    Perinatal arterial ischemic stroke    Scoliosis    Sleep apnea    Stroke birth   rt hemiparesis  Perinatal   Supervision of other normal pregnancy 05/04/2014    Family History  Problem Relation Age of Onset   Hypertension Mother    Asthma Mother    Hypertension Father    Sleep apnea Father    Asthma Sister    Cancer Maternal Grandfather        pancreatic   Cancer Paternal Grandmother        cancer all over   Other Paternal Grandfather        brain damage   Other Maternal Grandmother        heart problems    Social History   Socioeconomic History   Marital status: Married    Spouse name: Not on file   Number of children: 2   Years of education: Not on file   Highest education level: 12th grade  Occupational History   Occupation: Customer Service  Tobacco Use   Smoking status: Former    Types: Cigarettes    Quit date: 07/11/2007    Years since quitting: 15.6   Smokeless tobacco: Never  Vaping Use   Vaping Use: Never used  Substance and Sexual Activity   Alcohol use: Yes    Alcohol/week: 1.0 standard drink of alcohol    Types: 1 Standard  drinks or equivalent per week    Comment: once a month   Drug use: No   Sexual activity: Yes    Birth control/protection: I.U.D.  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     Currently nonsmoking    HH of 4 no pets     had mild preeclampsia with vaginal childbirth.  2011   cb 12 15 preeclampsi no complicatinos   ocass etoh.    Exercise  Walking     Sleep ok    caffiene limited per day .             Social Determinants of Health   Financial Resource Strain: Low Risk  (10/13/2022)   Overall Financial Resource Strain (CARDIA)    Difficulty of Paying Living Expenses: Not hard at all  Food Insecurity: No Food Insecurity (10/13/2022)   Hunger Vital Sign    Worried About  Running Out of Food in the Last Year: Never true    [place] in the Last Year: Never true  Transportation Needs: No Transportation Needs (10/13/2022)   PRAPARE - Hydrologist (Medical): No    Lack of Transportation (Non-Medical): No  Physical Activity: Insufficiently Active (10/13/2022)   Exercise Vital Sign    Days of Exercise per Week: 2 days    Minutes of Exercise per Session: 40 min  Stress: No Stress Concern Present (10/13/2022)   [place]    Feeling of Stress : Only a little  Social Connections: Moderately Isolated (10/13/2022)   Social Connection and Isolation Panel [NHANES]    Frequency of Communication with Friends and Family: More than three times a week    Frequency of Social Gatherings with Friends and Family: Twice a week    Attends Religious Services: Never    Marine scientist or Organizations: No    Attends Music therapist: Not on file    Marital Status: Married    Outpatient Medications Prior to Visit  Medication Sig Dispense Refill   amantadine (SYMMETREL)  100 MG capsule Take 1 capsule (100 mg total) by mouth 2 (two) times daily. 60 capsule 5   citalopram (CELEXA) 20 MG tablet TAKE 1 TABLET(20 MG) BY MOUTH DAILY 90 tablet 2   ibuprofen (ADVIL) 600 MG tablet Take 1 tablet (600 mg total) by mouth every 6 (six) hours as needed. 30 tablet 0   ketoconazole (NIZORAL) 2 % shampoo APPLY TO THE SCALP 3 TIMES WEEKLY APPLY TO DAMP SCALP, LATHER, LET SIT 5 MINUTES AND RINSE bridge therapy from dermatology 120 mL 3   levonorgestrel (MIRENA, 52 MG,) 20 MCG/DAY IUD Provided by [facility]     Multiple Vitamins-Calcium (ONE-A-DAY WOMENS PO) Take 1 tablet by mouth daily.     PROAIR HFA 108 (90 Base) MCG/ACT inhaler INHALE 1-2 PUFFS INTO THE LUNGS EVERY 6 (SIX) HOURS AS NEEDED FOR WHEEZING OR SHORTNESS OF BREATH. (Patient taking differently: Inhale 1-2 puffs into the lungs every 6 (six) hours as needed for wheezing or shortness of breath.) 8.5 Inhaler 0   No facility-administered medications prior to visit.     EXAM:  BP (!) 132/100 (BP Location: Right Arm, Cuff Size: Large)   Pulse 78   Temp 97.7 F (36.5 C) (Oral)   Ht 5\' 1"  (1.549 m)   Wt 251 lb 3.2 oz (113.9 kg)   SpO2 97%   Breastfeeding No   BMI 47.46 kg/m   Body mass index is 47.46 kg/m.  GENERAL: vitals reviewed and listed above, alert, oriented, appears well hydrated and in no acute distress  Looks well  right hemiparesis  and atrophy .pulses present  Left BP 132/88 large cugg  right  large 124? Diastolic Repeat 99991111 Right  PSYCH: pleasant and cooperative, no obvious depression or anxiety Lab Results  Component Value Date   WBC 8.9 12/23/2022   HGB 15.2 (H) 12/23/2022   HCT 44.4 12/23/2022   PLT 454.0 (H) 12/23/2022   GLUCOSE 101 (H) 12/23/2022   CHOL 178 03/09/2023   TRIG 186.0 (H) 03/09/2023   HDL 36.10 (L) 03/09/2023   LDLDIRECT 161.0 12/16/2021   LDLCALC 105 (H) 03/09/2023   ALT 14 12/23/2022   AST 14 12/23/2022   NA 141 12/23/2022   K 5.1 12/23/2022   CL 106 12/23/2022    CREATININE 0.55 12/23/2022   BUN  20 12/23/2022   CO2 26 12/23/2022   TSH 1.57 12/23/2022   HGBA1C 5.4 12/23/2022   MICROALBUR 0.9 09/20/2012   BP Readings from Last 3 Encounters:  03/17/23 (!) 132/100  02/26/23 (!) 151/112  02/17/23 (!) 127/92    ASSESSMENT AND PLAN:  Discussed the following assessment and plan:  Diastolic hypertension  Morbid obesity - would consider glp1 if covered by insurance Stage 1 at home   Has  fam hx . Working on weight loss which  should help but add medication for now  Since on ssri will avoid diuretic for now  Trial low dose amlodipine.  Watch for  hypotension  Send in readings  2 weeks and rov in 2 weeks  -Patient advised to return or notify health care team  if  new concerns arise.  Patient Instructions  Good to see you today .  Diastolic BP  is high but not systolic  Continue lifestyle intervention healthy eating and exercise . That may help control over time  But for now will add low dose amlodipine 2.5 mg per day  Send  in some readings after 2 weeks  FU visit in 2 months   for bp assessment .    Standley Brooking. Kursten Kruk M.D.

## 2023-03-23 ENCOUNTER — Ambulatory Visit: Payer: BC Managed Care – PPO | Attending: Neurology | Admitting: Physical Therapy

## 2023-03-23 VITALS — BP 120/96 | HR 74

## 2023-03-23 DIAGNOSIS — M79602 Pain in left arm: Secondary | ICD-10-CM | POA: Insufficient documentation

## 2023-03-23 DIAGNOSIS — R2681 Unsteadiness on feet: Secondary | ICD-10-CM | POA: Insufficient documentation

## 2023-03-23 DIAGNOSIS — M6281 Muscle weakness (generalized): Secondary | ICD-10-CM | POA: Diagnosis not present

## 2023-03-23 DIAGNOSIS — R2689 Other abnormalities of gait and mobility: Secondary | ICD-10-CM | POA: Diagnosis not present

## 2023-03-23 DIAGNOSIS — M5412 Radiculopathy, cervical region: Secondary | ICD-10-CM | POA: Diagnosis not present

## 2023-03-23 NOTE — Therapy (Signed)
OUTPATIENT PHYSICAL THERAPY CERVICAL EVALUATION   Patient Name: Tanny Harnack MRN: [id number] DOB:Apr 01, 1991, 32 y.o., female Today's Date: 03/23/2023  END OF SESSION:  PT End of Session - 03/23/23 0803     Visit Number 1    Number of Visits 13   Plus eval   Date for PT Re-Evaluation 05/25/23    Authorization Type [employer] (30 VL combined w/OT)    PT Start Time 0801    PT Stop Time 0849    PT Time Calculation (min) 48 min    Activity Tolerance Patient tolerated treatment well    Behavior During Therapy [facility] for tasks assessed/performed             Past Medical History:  Diagnosis Date   Asthma    Gestational hypertension    Perinatal arterial ischemic stroke    Scoliosis    Sleep apnea    Stroke birth   rt hemiparesis  Perinatal   Supervision of other normal pregnancy 05/04/2014   Past Surgical History:  Procedure Laterality Date   FOOT SURGERY     rt foot   FOOT SURGERY     Bone placed   moles removed     from back   Patient Active Problem List   Diagnosis Date Noted   Diastolic hypertension 03/17/2023   Morbid obesity 03/17/2023   Hydradenitis 10/14/2022   OSA (obstructive sleep apnea) 06/03/2020   Insomnia 06/03/2020   Hemiparesis affecting right side as late effect of cerebrovascular accident (HCC) 02/06/2016   Pigmented skin lesion of uncertain nature 02/20/2015   Hyperlipidemia 02/20/2015   Visit for preventive health examination 02/20/2015   Hx of fall 02/20/2015   Encounter for IUD insertion 02/12/2015   Cystic 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, antepartum 11/14/2014   Rubella non-immune status, antepartum 07/31/2014   History of stroke 05/04/2014   Obesity (BMI 30-39.9) 09/29/2013   Thrombocytosis 10/01/2012   High blood triglycerides 09/27/2012   Abnormal LFTs 09/27/2012   Weight gain 09/27/2012   Shaking 04/22/2012   Hyperglycemia 09/23/2011   Abnormal glucose 09/21/2011   Falling episodes 09/21/2011   Cervical radicular pain 08/03/2011   Wrist pain  02/16/2011   Hemiparesis, right 02/16/2011   SCOLIOSIS 01/16/2009    PCP: Madelin Headings, MD  REFERRING PROVIDER: Drema Dallas, DO  REFERRING DIAG: [phone] (ICD-10-CM) - Left cervical radiculopathy  THERAPY DIAG:  Radiculopathy, cervical region  Pain in left arm  Muscle weakness (generalized)  Rationale for Evaluation and Treatment: Rehabilitation  ONSET DATE: 02/19/2023 (referral)  SUBJECTIVE:  SUBJECTIVE STATEMENT: Pt reports she started BP medication and checked her BP this morning and it was good. Pt reports she is having significant pain/numbness in her L hand. Pt reports standing for longer than 2-3 hours is very painful for her R hip due to her perinatal stroke. Pt reports she wakes up with her entire LUE numb regardless of position she sleeps. Pt crochets frequently and reports her L hand is painful with activity, suspects she has carpal tunnel. Pt reports she wants to lose weight but is limited due to R knee pain and feeling as though her R knee is going to buckle on her with activity. Has fallen twice in past year due to her R knee buckling on her.    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  For several years, she will fall.  It will occur every 3 months or so.  She says her right leg will just give out.  She does report some right knee pain.  Since around 2013, she has had arrhythmic movements of her right upper extremity and right foot.  She was evaluated by neurology for these movements at that time.  At that time, she also exhibited jerking in the left hand.  MRI of brain on 06/02/2012 personally reviewed revealed old left MCA stroke but no new findings.  She had a routine  EEG that revealed intermittent arrhythmic theta and delta slowing in the left central parietal region that appeared to correlate with right arm and leg jerking.  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  She was started on Keppra but discontinued due to dizziness and was instead started on clonazepam.  She was lost to follow up.    She continues to have involuntary right sided movements.  It occurs daily throughout the day, at rest or while in bed.  She says the falls are not associated with involuntary movement of the right lower extremity.    PAIN:  Are you having pain? Yes: NPRS scale: 5-6/10 Pain location: LUE Pain description: Tingly, little knives stabbing arm  Aggravating factors: Sleeping on L side Relieving factors: None  PRECAUTIONS: Fall  WEIGHT BEARING RESTRICTIONS: No  FALLS:  Has patient fallen in last 6 months? Yes. Number of falls 2  LIVING ENVIRONMENT: Lives with: lives with their family and lives with their spouse Lives in: House/apartment Stairs: Yes: External: 3-5 in front and back steps; can reach both and only uses R side Has following equipment at home:  knee brace  OCCUPATION: Unemployed   PLOF: Independent  PATIENT GOALS: "Getting better control of my body"   OBJECTIVE:   DIAGNOSTIC FINDINGS:  MRI of brain on 02/20/2016  IMPRESSION: 1. Unchanged appearance of the brain without acute abnormality or mass. 2. Chronic left MCA infarct.  VITALS  Vitals:   03/23/23 0824  BP: (!) 120/96  Pulse: 74     PATIENT SURVEYS:  NDI 22/50 (moderate disability) Myelopathy Disability Index: 11/30   COGNITION: Overall cognitive status: Within functional limits for tasks assessed  SENSATION: Frequent numbness of LUE   POSTURE: rounded shoulders, forward head, and excessive supination of R foot in seated position    CERVICAL ROM: Tested in seated position   Active ROM A/PROM (deg) eval  Flexion 38   Extension 51  Right lateral flexion 51  Left lateral flexion 31  Right rotation 70  Left rotation  65   (Blank rows = not tested)  CERVICAL SPECIAL TESTS:  Nydia Bouton Test: Tested on LUE only - negative (painful, no numbness)   FUNCTIONAL TESTS:  5 times sit to stand: 19.43s without UE support   TODAY'S TREATMENT:            Next Session                                                                                                                     PATIENT EDUCATION:  Education details: POC, eval findings, info on AFOs  Person educated: Patient Education method: Medical illustrator Education comprehension: verbalized understanding  HOME EXERCISE PROGRAM: To be established   ASSESSMENT:  CLINICAL IMPRESSION: Patient is a 32 year old femlae referred to Neuro OPPT for cervical radiculopathy.  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 The following deficits were present during the exam: decreased strength, decreased balance, impaired sensation and decreased functional ROM. Based on history of falls and 5x STS, pt is an incr risk for falls. Balance to be further assessed next session. Pt would benefit from skilled PT to address these impairments and functional limitations to maximize functional mobility independence   OBJECTIVE IMPAIRMENTS: Abnormal gait, decreased activity tolerance, decreased balance, decreased coordination, decreased endurance, decreased mobility, difficulty walking, decreased strength, impaired sensation, impaired UE functional use, improper body mechanics, and pain   ACTIVITY LIMITATIONS: carrying, lifting, bending, standing, squatting, sleeping, stairs, transfers, bathing, locomotion level, and caring for others  PARTICIPATION LIMITATIONS: meal prep, cleaning, laundry, driving, shopping, community activity, occupation, and yard work  PERSONAL FACTORS: Age, Fitness, Past/current experiences, and  1 comorbidity: perinatal CVA  are also affecting patient's functional outcome.   REHAB POTENTIAL: Good  CLINICAL DECISION MAKING: Evolving/moderate complexity  EVALUATION COMPLEXITY: Moderate   GOALS: Goals reviewed with patient? Yes  SHORT TERM GOALS: Target date: 04/20/2023  Pt will be independent with initial HEP for improved strength, balance, transfers and gait.  Baseline: Not established on eval  Goal status: INITIAL  2.  Pt will trial various braces on RLE to determine safest option for mobility and maximize independence.  Baseline:  Goal status: INITIAL  3.  MiniBest to be assessed and STG/LTG written  Baseline:  Goal status: INITIAL  4.  Pt will improve cervical left lateral flexion by >/= 10 degrees for improved functional ROM  Baseline: 31 degrees  Goal status: INITIAL  5.  Gait speed to be assessed and STG/LTG written  Baseline:  Goal status: INITIAL   LONG TERM GOALS: Target date: 05/18/2023   Pt will be independent with final HEP for improved strength, balance, transfers and gait.  Baseline:  Goal status: INITIAL  2.  MiniBest goal  Baseline:  Goal status: INITIAL  3.  Pt will improve NDI to >/= 15/50 for decreased pain levels and improved independence w/ADLs Baseline: 22/50 Goal status: INITIAL  4.  Gait speed goal  Baseline:  Goal status: INITIAL    PLAN:  PT FREQUENCY:  2/week for 4 weeks and then 1x/week for 4 weeks  PT DURATION: 8 weeks  PLANNED INTERVENTIONS: Therapeutic exercises, Therapeutic activity, Neuromuscular re-education, Balance training, Gait training, Patient/Family education, Self Care, Joint mobilization, Joint manipulation, Stair training, Vestibular training, Canalith repositioning, Orthotic/Fit training, DME instructions, Dry Needling, Manual therapy, and Re-evaluation  PLAN FOR NEXT SESSION: Gait speed and minibest and update goals. Trial AFOs on RLE. HEP for quad strength, cervical ROM and overall strength.     Jill AlexandersJannah E Jaquavious Mercer, PT, DPT 03/23/2023, 8:55 AM

## 2023-03-25 ENCOUNTER — Ambulatory Visit: Payer: BC Managed Care – PPO | Admitting: Physical Therapy

## 2023-03-25 ENCOUNTER — Telehealth: Payer: Self-pay | Admitting: Physical Therapy

## 2023-03-25 VITALS — BP 125/95 | HR 78

## 2023-03-25 DIAGNOSIS — M6281 Muscle weakness (generalized): Secondary | ICD-10-CM

## 2023-03-25 DIAGNOSIS — M5412 Radiculopathy, cervical region: Secondary | ICD-10-CM

## 2023-03-25 DIAGNOSIS — R2689 Other abnormalities of gait and mobility: Secondary | ICD-10-CM

## 2023-03-25 DIAGNOSIS — M79602 Pain in left arm: Secondary | ICD-10-CM | POA: Diagnosis not present

## 2023-03-25 DIAGNOSIS — R2681 Unsteadiness on feet: Secondary | ICD-10-CM | POA: Diagnosis not present

## 2023-03-25 NOTE — Therapy (Signed)
OUTPATIENT PHYSICAL THERAPY CERVICAL TREATMENT   Patient Name: Baeli Brinkmeier MRN: [id number] DOB:[birth year], 32 y.o., female Today's Date: 03/25/2023  END OF SESSION:  PT End of Session - 03/25/23 0745     Visit Number 2    Number of Visits 13   Plus eval   Date for PT Re-Evaluation 05/25/23    Authorization Type [employer] (30 VL combined w/OT)    PT Start Time 0744    PT Stop Time 0832    PT Time Calculation (min) 48 min    Equipment Utilized During Treatment Gait belt   R knee brace   Activity Tolerance Patient tolerated treatment well    Behavior During Therapy WFL for tasks assessed/performed              Past Medical History:  Diagnosis Date   Asthma    Gestational hypertension    Perinatal arterial ischemic stroke    Scoliosis    Sleep apnea    Stroke birth   rt hemiparesis  Perinatal   Supervision of other normal pregnancy 05/04/2014   Past Surgical History:  Procedure Laterality Date   FOOT SURGERY     rt foot   FOOT SURGERY     Bone placed   moles removed     from back   Patient Active Problem List   Diagnosis Date Noted   Diastolic hypertension 03/17/2023   Morbid obesity 03/17/2023   Hydradenitis 10/14/2022   OSA (obstructive sleep apnea) 06/03/2020   Insomnia 06/03/2020   Hemiparesis affecting right side as late effect of cerebrovascular accident (HCC) 02/06/2016   Pigmented skin lesion of uncertain nature 02/20/2015   Hyperlipidemia 02/20/2015   Visit for preventive health examination 02/20/2015   Hx of fall 02/20/2015   Encounter for IUD insertion 02/12/2015   Cystic 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, antepartum 11/14/2014   Rubella non-immune status, antepartum 07/31/2014   History of stroke 05/04/2014   Obesity (BMI 30-39.9) 09/29/2013   Thrombocytosis 10/01/2012   High blood triglycerides 09/27/2012   Abnormal LFTs 09/27/2012   Weight gain 09/27/2012   Shaking 04/22/2012   Hyperglycemia 09/23/2011   Abnormal glucose 09/21/2011   Falling episodes  09/21/2011   Cervical radicular pain 08/03/2011   Wrist pain 02/16/2011   Hemiparesis, right 02/16/2011   SCOLIOSIS 01/16/2009    PCP: Madelin Headings, MD  REFERRING PROVIDER: Drema Dallas, DO  REFERRING DIAG: [phone] (ICD-10-CM) - Left cervical radiculopathy  THERAPY DIAG:  Radiculopathy, cervical region  Other abnormalities of gait and mobility  Muscle weakness (generalized)  Rationale for Evaluation and Treatment: Rehabilitation  ONSET DATE: 02/19/2023 (referral)  SUBJECTIVE:  SUBJECTIVE STATEMENT: Pt reports she is having more pain today, wearing her R knee brace to clinic. No acute changes since last session.    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  For several years, she will fall.  It will occur every 3 months or so.  She says her right leg will just give out.  She does report some right knee pain.  Since around 2013, she has had arrhythmic movements of her right upper extremity and right foot.  She was evaluated by neurology for these movements at that time.  At that time, she also exhibited jerking in the left hand.  MRI of brain on 06/02/2012 personally reviewed revealed old left MCA stroke but no new findings.  She had a routine EEG that revealed intermittent arrhythmic theta and delta slowing in the left central parietal region that appeared to correlate with right arm and leg jerking.  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  She was started on Keppra but discontinued due to dizziness and was instead started on clonazepam.  She was lost to follow up.    She continues to have involuntary right sided movements.  It  occurs daily throughout the day, at rest or while in bed.  She says the falls are not associated with involuntary movement of the right lower extremity.    PAIN:  Are you having pain? Yes: NPRS scale: 6/10 Pain location: LUE and back Pain description: Tingly, little knives stabbing arm  Aggravating factors: Sleeping on L side Relieving factors: None  PRECAUTIONS: Fall  WEIGHT BEARING RESTRICTIONS: No  FALLS:  Has patient fallen in last 6 months? Yes. Number of falls 2  LIVING ENVIRONMENT: Lives with: lives with their family and lives with their spouse Lives in: House/apartment Stairs: Yes: External: 3-5 in front and back steps; can reach both and only uses R side Has following equipment at home:  knee brace  OCCUPATION: Unemployed   PLOF: Independent  PATIENT GOALS: "Getting better control of my body"   OBJECTIVE:   DIAGNOSTIC FINDINGS:  MRI of brain on 02/20/2016  IMPRESSION: 1. Unchanged appearance of the brain without acute abnormality or mass. 2. Chronic left MCA infarct.  VITALS  Vitals:   03/25/23 0805 03/25/23 0812  BP: (!) 136/107 (!) 125/95  Pulse: 78 78     PATIENT SURVEYS: Taken on Eval  NDI 22/50 (moderate disability) Myelopathy Disability Index: 11/30   COGNITION: Overall cognitive status: Within functional limits for tasks assessed  SENSATION: Frequent numbness of LUE   POSTURE: rounded shoulders, forward head, and excessive supination of R foot in seated position    CERVICAL ROM: Tested in seated position   Active ROM A/PROM (deg) eval  Flexion 38  Extension 51  Right lateral flexion 51  Left lateral flexion 31  Right rotation 70  Left rotation 65   (Blank rows = not tested)   TODAY'S TREATMENT:            Ther Act  [facility] - 03/25/23 0752       Ambulation/Gait   Gait velocity 32.8' over 10.28s = 3.2 ft/s w/no AD      Balance   Balance Assessed Yes      Standardized Balance Assessment   Standardized Balance  Assessment Mini-BESTest;Timed Up and Go Test      Mini-BESTest   Sit To Stand Normal:  Comes to stand without use of hands and stabilizes independently.    Rise to Toes Moderate: Heels up, but not full range (smaller than when holding hands), OR noticeable instability for 3 s.    Stand on one leg (left) Moderate: < 20 s   2.2s   Stand on one leg (right) Moderate: < 20 s   3s   Stand on one leg - lowest score 1    Stance - Feet together, eyes open, firm surface  Normal: 30s    Stance - Feet together, eyes closed, foam surface  Normal: 30s    Incline - Eyes Closed Normal: Stands independently 30s and aligns with gravity    Change in Gait Speed Moderate: Unable to change walking speed or signs of imbalance    Walk with head turns - Horizontal Normal: performs head turns with no change in gait speed and good balance    Walk with pivot turns Moderate:Turns with feet close SLOW (>4 steps) with good balance.    Step over obstacles Moderate: Steps over box but touches box OR displays cautious behavior by slowing gait.    Timed UP & GO with Dual Task Moderate: Dual Task affects either counting OR walking (>10%) when compared to the TUG without Dual Task.      Timed Up and Go Test   Normal TUG (seconds) 11.44    Cognitive TUG (seconds) 31.56   instructing therapist on how to crochet a hat   TUG Comments No AD            Assessed BP (see above) and pt's diastolic elevated to 107 mmHg during minibest. Had pt rest in seated position and showed pt various AFO options for RLE. Reassessed BP after 5 minutes and BP lowered to 125/95 mmHg so continued with therapy.   Ther Ex  Established initial HEP (see bolded below) for BLE strength, posture and pain reduction. Pt demonstrated exercises well w/no increase in pain.                                                                                                                      PATIENT EDUCATION:  Education details: Doctor, hospital, initial  HEP Person educated: Patient Education method: Explanation, Demonstration, and Handouts Education comprehension: verbalized understanding and returned demonstration  HOME EXERCISE PROGRAM: Access Code: [email or web address] URL: [email or web address] Date: 03/25/2023 Prepared by: Alethia Berthold Suvan Stcyr  Exercises - Cervical Retraction at Wall  - 1 x daily - 7 x weekly - 3 sets - 10 reps - Forward Step Down Touch with Toe  - 1 x daily - 7 x weekly - 3 sets - 10 reps - Forward Step Up  - 1 x daily - 7 x weekly - 3 sets - 10 reps  ASSESSMENT:  CLINICAL IMPRESSION: Emphasis of skilled PT session on balance assessment, BLE strength and establishing initial HEP. Pt's diastolic BP elevated throughout session, but did reduce w/seated rest to stay within limits for therapy. Unable to complete MiniBest this date,  but pt demonstrates greatest deficits in anticipatory balance strategies and dynamic gait. Continue POC.    OBJECTIVE IMPAIRMENTS: Abnormal gait, decreased activity tolerance, decreased balance, decreased coordination, decreased endurance, decreased mobility, difficulty walking, decreased strength, impaired sensation, impaired UE functional use, improper body mechanics, and pain   ACTIVITY LIMITATIONS: carrying, lifting, bending, standing, squatting, sleeping, stairs, transfers, bathing, locomotion level, and caring for others  PARTICIPATION LIMITATIONS: meal prep, cleaning, laundry, driving, shopping, community activity, occupation, and yard work  PERSONAL FACTORS: Age, Fitness, Past/current experiences, and 1 comorbidity: perinatal CVA  are also affecting patient's functional outcome.   REHAB POTENTIAL: Good  CLINICAL DECISION MAKING: Evolving/moderate complexity  EVALUATION COMPLEXITY: Moderate   GOALS: Goals reviewed with patient? Yes  SHORT TERM GOALS: Target date: 04/20/2023  Pt will be independent with initial HEP for improved strength, balance, transfers and gait.  Baseline:  Not established on eval  Goal status: INITIAL  2.  Pt will trial various braces on RLE to determine safest option for mobility and maximize independence.  Baseline:  Goal status: INITIAL  3.  MiniBest to be assessed and STG/LTG written  Baseline:  Goal status: INITIAL  4.  Pt will improve cervical left lateral flexion by >/= 10 degrees for improved functional ROM  Baseline: 31 degrees  Goal status: INITIAL  5. Pt will improve gait velocity to at least 3.5 ft/s with or without AFO for improved gait efficiency and independence   Baseline: 3.2 ft/s no AFO Goal status: REVISED   LONG TERM GOALS: Target date: 05/18/2023   Pt will be independent with final HEP for improved strength, balance, transfers and gait.  Baseline:  Goal status: INITIAL  2.  MiniBest goal  Baseline:  Goal status: INITIAL  3.  Pt will improve NDI to >/= 15/50 for decreased pain levels and improved independence w/ADLs Baseline: 22/50 Goal status: INITIAL  4.  Pt will improve gait velocity to at least 3.8 ft/s with or without AFO for improved gait efficiency   Baseline: 3.2 ft/s no AFO Goal status: REVISED    PLAN:  PT FREQUENCY:  2/week for 4 weeks and then 1x/week for 4 weeks  PT DURATION: 8 weeks  PLANNED INTERVENTIONS: Therapeutic exercises, Therapeutic activity, Neuromuscular re-education, Balance training, Gait training, Patient/Family education, Self Care, Joint mobilization, Joint manipulation, Stair training, Vestibular training, Canalith repositioning, Orthotic/Fit training, DME instructions, Dry Needling, Manual therapy, and Re-evaluation  PLAN FOR NEXT SESSION: Finish MiniBest and update goal. Trial AFOs on RLE. Add to HEP for quad strength, cervical ROM and overall strength. Lifting   Jill Alexanders Rianne Degraaf, PT, DPT 03/25/2023, 8:37 AM

## 2023-03-30 ENCOUNTER — Ambulatory Visit: Payer: BC Managed Care – PPO | Admitting: Physical Therapy

## 2023-03-30 ENCOUNTER — Telehealth: Payer: Self-pay | Admitting: Physical Therapy

## 2023-03-30 VITALS — BP 125/95 | HR 74

## 2023-03-30 DIAGNOSIS — R2689 Other abnormalities of gait and mobility: Secondary | ICD-10-CM

## 2023-03-30 DIAGNOSIS — M5412 Radiculopathy, cervical region: Secondary | ICD-10-CM | POA: Diagnosis not present

## 2023-03-30 DIAGNOSIS — M79602 Pain in left arm: Secondary | ICD-10-CM | POA: Diagnosis not present

## 2023-03-30 DIAGNOSIS — M6281 Muscle weakness (generalized): Secondary | ICD-10-CM

## 2023-03-30 DIAGNOSIS — R2681 Unsteadiness on feet: Secondary | ICD-10-CM | POA: Diagnosis not present

## 2023-03-30 DIAGNOSIS — R29898 Other symptoms and signs involving the musculoskeletal system: Secondary | ICD-10-CM

## 2023-03-30 NOTE — Telephone Encounter (Signed)
Dr. Everlena Cooper,  Dulce Sellar was evaluated by PT on 03/23/23 for cervical myelopathy. However, upon evaluation, the patient reported she would like to work on her gait and balance. Can you send in a new referral diagnosis so I can address this in clinic?   The patient would also benefit from a R AFO due to RLE weakness and instability.  If you agree, please addend your most recent visit note (02/17/23) stating medical necessity for an AFO and place an order in [facility]que in [facility] or fax the order to [phone].    Thank you, Ernestene Kiel, PT, DPT [facility] [address] [place], [place]  [address] Phone:  [phone] Fax:  [phone]

## 2023-03-30 NOTE — Therapy (Signed)
OUTPATIENT PHYSICAL THERAPY CERVICAL TREATMENT   Patient Name: Phoebe Marter MRN: [id number] DOB:1991-09-07, 32 y.o., female Today's Date: 03/30/2023  END OF SESSION:  PT End of Session - 03/30/23 0746     Visit Number 3    Number of Visits 13   Plus eval   Date for PT Re-Evaluation 05/25/23    Authorization Type BCBS (30 VL combined w/OT)    PT Start Time 0745    PT Stop Time 0828    PT Time Calculation (min) 43 min    Equipment Utilized During Treatment Gait belt   R knee brace   Activity Tolerance Patient tolerated treatment well    Behavior During Therapy WFL for tasks assessed/performed               Past Medical History:  Diagnosis Date   Asthma    Gestational hypertension    Perinatal arterial ischemic stroke    Scoliosis    Sleep apnea    Stroke birth   rt hemiparesis  Perinatal   Supervision of other normal pregnancy 05/04/2014   Past Surgical History:  Procedure Laterality Date   FOOT SURGERY     rt foot   FOOT SURGERY     Bone placed   moles removed     from back   Patient Active Problem List   Diagnosis Date Noted   Diastolic hypertension 03/17/2023   Morbid obesity 03/17/2023   Hydradenitis 10/14/2022   OSA (obstructive sleep apnea) 06/03/2020   Insomnia 06/03/2020   Hemiparesis affecting right side as late effect of cerebrovascular accident (HCC) 02/06/2016   Pigmented skin lesion of uncertain nature 02/20/2015   Hyperlipidemia 02/20/2015   Visit for preventive health examination 02/20/2015   Hx of fall 02/20/2015   Encounter for IUD insertion 02/12/2015   Cystic 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, antepartum 11/14/2014   Rubella non-immune status, antepartum 07/31/2014   History of stroke 05/04/2014   Obesity (BMI 30-39.9) 09/29/2013   Thrombocytosis 10/01/2012   High blood triglycerides 09/27/2012   Abnormal LFTs 09/27/2012   Weight gain 09/27/2012   Shaking 04/22/2012   Hyperglycemia 09/23/2011   Abnormal glucose 09/21/2011   Falling episodes  09/21/2011   Cervical radicular pain 08/03/2011   Wrist pain 02/16/2011   Hemiparesis, right 02/16/2011   SCOLIOSIS 01/16/2009    PCP: Madelin Headings, MD  REFERRING PROVIDER: Drema Dallas, DO  REFERRING DIAG: [phone] (ICD-10-CM) - Left cervical radiculopathy  THERAPY DIAG:  Other abnormalities of gait and mobility  Muscle weakness (generalized)  Unsteadiness on feet  Rationale for Evaluation and Treatment: Rehabilitation  ONSET DATE: 02/19/2023 (referral)  SUBJECTIVE:  SUBJECTIVE STATEMENT: Pt reports she has been doing her exercises frequently. They are challenging but helpful. No falls since last session.   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  For several years, she will fall.  It will occur every 3 months or so.  She says her right leg will just give out.  She does report some right knee pain.  Since around 2013, she has had arrhythmic movements of her right upper extremity and right foot.  She was evaluated by neurology for these movements at that time.  At that time, she also exhibited jerking in the left hand.  MRI of brain on 06/02/2012 personally reviewed revealed old left MCA stroke but no new findings.  She had a routine EEG that revealed intermittent arrhythmic theta and delta slowing in the left central parietal region that appeared to correlate with right arm and leg jerking.  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  She was started on Keppra but discontinued due to dizziness and was instead started on clonazepam.  She was lost to follow up.    She continues to have involuntary right sided movements.  It occurs  daily throughout the day, at rest or while in bed.  She says the falls are not associated with involuntary movement of the right lower extremity.    PAIN:  Are you having pain? Yes: NPRS scale: 6/10 Pain location: LUE and back Pain description: Tingly, little knives stabbing arm  Aggravating factors: Sleeping on L side Relieving factors: None  PRECAUTIONS: Fall  WEIGHT BEARING RESTRICTIONS: No  FALLS:  Has patient fallen in last 6 months? Yes. Number of falls 2  LIVING ENVIRONMENT: Lives with: lives with their family and lives with their spouse Lives in: House/apartment Stairs: Yes: External: 3-5 in front and back steps; can reach both and only uses R side Has following equipment at home:  knee brace  OCCUPATION: Unemployed   PLOF: Independent  PATIENT GOALS: "Getting better control of my body"   OBJECTIVE:   DIAGNOSTIC FINDINGS:  MRI of brain on 02/20/2016  IMPRESSION: 1. Unchanged appearance of the brain without acute abnormality or mass. 2. Chronic left MCA infarct.  VITALS  Vitals:   03/30/23 0751  BP: (!) 125/95  Pulse: 74      PATIENT SURVEYS: Taken on Eval  NDI 22/50 (moderate disability) Myelopathy Disability Index: 11/30   COGNITION: Overall cognitive status: Within functional limits for tasks assessed  SENSATION: Frequent numbness of LUE   POSTURE: rounded shoulders, forward head, and excessive supination of R foot in seated position    CERVICAL ROM: Tested in seated position   Active ROM A/PROM (deg) eval  Flexion 38  Extension 51  Right lateral flexion 51  Left lateral flexion 31  Right rotation 70  Left rotation 65   (Blank rows = not tested)   TODAY'S TREATMENT:            Ther Act  [facility] PT Assessment - 03/30/23 0753       Mini-BESTest   Sit To Stand Normal: Comes to stand without use of hands and stabilizes independently.    Rise to Toes Moderate: Heels up, but not full range (smaller than when holding hands), OR noticeable  instability for 3 s.    Stand on one leg (left) Moderate: < 20 s   2.2s   Stand on one  leg (right) Moderate: < 20 s   3s   Stand on one leg - lowest score 1    Compensatory Stepping Correction - Forward Normal: Recovers independently with a single, large step (second realignement is allowed).    Compensatory Stepping Correction - Backward Normal: Recovers independently with a single, large step    Compensatory Stepping Correction - Left Lateral Normal: Recovers independently with 1 step (crossover or lateral OK)    Compensatory Stepping Correction - Right Lateral Normal: Recovers independently with 1 step (crossover or lateral OK)   Crossover step   Stepping Corredtion Lateral - lowest score 2    Stance - Feet together, eyes open, firm surface  Normal: 30s    Stance - Feet together, eyes closed, foam surface  Normal: 30s    Incline - Eyes Closed Normal: Stands independently 30s and aligns with gravity    Change in Gait Speed Moderate: Unable to change walking speed or signs of imbalance    Walk with head turns - Horizontal Normal: performs head turns with no change in gait speed and good balance    Walk with pivot turns Moderate:Turns with feet close SLOW (>4 steps) with good balance.    Step over obstacles Moderate: Steps over box but touches box OR displays cautious behavior by slowing gait.    Timed UP & GO with Dual Task Moderate: Dual Task affects either counting OR walking (>10%) when compared to the TUG without Dual Task.    Mini-BEST total score 22             Gait Training  Gait pattern:  IR of R hip, excessive supination of R foot, step through pattern, and decreased ankle dorsiflexion- Right Distance walked: 345'  Assistive device utilized:  R foot-up brace size XL Level of assistance: Modified independence Comments: Noted proper knee position during stance phase on R side but brace promoted excessive ankle supination and hip IR w/stance.   Gait pattern: step through  pattern and lateral hip instability Distance walked: 230' Assistive device utilized:  [employer] AFO on R Level of assistance: Modified independence Comments: Noted improved ankle control despite brace having medial strut rather than lateral. Pt reported liking this brace more than the foot-up but would like to try one with anterior guard. Unable to fit an anterior guard brace into pt's shoe this date, so pt to bring different shoes next session.                                                                                                                    PATIENT EDUCATION:  Education details: MiniBest results, continue HEP, plan to bring larger shoes next session  Person educated: Patient Education method: Explanation, Demonstration, and Handouts Education comprehension: verbalized understanding and returned demonstration  HOME EXERCISE PROGRAM: Access Code: [email or web address] URL: [email or web address] Date: 03/25/2023 Prepared by: Alethia Berthold Abiageal Blowe  Exercises - Cervical Retraction at Wall  - 1 x daily - 7 x weekly - 3 sets - 10 reps - Forward  Step Down Touch with Toe  - 1 x daily - 7 x weekly - 3 sets - 10 reps - Forward Step Up  - 1 x daily - 7 x weekly - 3 sets - 10 reps  ASSESSMENT:  CLINICAL IMPRESSION: Emphasis of skilled PT session on balance assessment and gait training with various braces. Pt scored a 22/28 on MiniBest, w/greatest challenge being single leg stability and dynamic gait tasks. Pt prefers WalkOn AFO over foot-up brace due to increased stability provided at the ankle. Unable to trial anterior guard AFO this date due to pt's small shoes, but pt to bring larger shoes next session. Continue POC.    OBJECTIVE IMPAIRMENTS: Abnormal gait, decreased activity tolerance, decreased balance, decreased coordination, decreased endurance, decreased mobility, difficulty walking, decreased strength, impaired sensation, impaired UE functional use, improper body  mechanics, and pain   ACTIVITY LIMITATIONS: carrying, lifting, bending, standing, squatting, sleeping, stairs, transfers, bathing, locomotion level, and caring for others  PARTICIPATION LIMITATIONS: meal prep, cleaning, laundry, driving, shopping, community activity, occupation, and yard work  PERSONAL FACTORS: Age, Fitness, Past/current experiences, and 1 comorbidity: perinatal CVA  are also affecting patient's functional outcome.   REHAB POTENTIAL: Good  CLINICAL DECISION MAKING: Evolving/moderate complexity  EVALUATION COMPLEXITY: Moderate   GOALS: Goals reviewed with patient? Yes  SHORT TERM GOALS: Target date: 04/20/2023  Pt will be independent with initial HEP for improved strength, balance, transfers and gait.  Baseline: Not established on eval  Goal status: INITIAL  2.  Pt will trial various braces on RLE to determine safest option for mobility and maximize independence.  Baseline:  Goal status: INITIAL  3.  MiniBest to be assessed and LTG written  Baseline: 22/28 Goal status: REVISED  4.  Pt will improve cervical left lateral flexion by >/= 10 degrees for improved functional ROM  Baseline: 31 degrees  Goal status: INITIAL  5. Pt will improve gait velocity to at least 3.5 ft/s with or without AFO for improved gait efficiency and independence   Baseline: 3.2 ft/s no AFO Goal status: REVISED   LONG TERM GOALS: Target date: 05/18/2023   Pt will be independent with final HEP for improved strength, balance, transfers and gait.  Baseline:  Goal status: INITIAL  2.  Pt will improve MiniBest to 25/28 for decreased fall risk and improvement with compensatory stepping strategies.   Baseline: 22/28 Goal status: REVISED  3.  Pt will improve NDI to >/= 15/50 for decreased pain levels and improved independence w/ADLs Baseline: 22/50 Goal status: INITIAL  4.  Pt will improve gait velocity to at least 3.8 ft/s with or without AFO for improved gait efficiency    Baseline: 3.2 ft/s no AFO Goal status: REVISED    PLAN:  PT FREQUENCY:  2/week for 4 weeks and then 1x/week for 4 weeks  PT DURATION: 8 weeks  PLANNED INTERVENTIONS: Therapeutic exercises, Therapeutic activity, Neuromuscular re-education, Balance training, Gait training, Patient/Family education, Self Care, Joint mobilization, Joint manipulation, Stair training, Vestibular training, Canalith repositioning, Orthotic/Fit training, DME instructions, Dry Needling, Manual therapy, and Re-evaluation  PLAN FOR NEXT SESSION:Trial AFOs on RLE. Add to HEP for quad strength, cervical ROM and overall strength. Lifting   Jill Alexanders [place]ville Hibberd, PT, DPT 03/30/2023, 8:29 AM

## 2023-03-31 NOTE — Telephone Encounter (Signed)
Orders added

## 2023-04-01 ENCOUNTER — Ambulatory Visit: Payer: BC Managed Care – PPO | Admitting: Physical Therapy

## 2023-04-01 VITALS — BP 116/92 | HR 79

## 2023-04-01 DIAGNOSIS — R2681 Unsteadiness on feet: Secondary | ICD-10-CM

## 2023-04-01 DIAGNOSIS — M5412 Radiculopathy, cervical region: Secondary | ICD-10-CM | POA: Diagnosis not present

## 2023-04-01 DIAGNOSIS — M79602 Pain in left arm: Secondary | ICD-10-CM | POA: Diagnosis not present

## 2023-04-01 DIAGNOSIS — R2689 Other abnormalities of gait and mobility: Secondary | ICD-10-CM

## 2023-04-01 DIAGNOSIS — M6281 Muscle weakness (generalized): Secondary | ICD-10-CM

## 2023-04-01 NOTE — Therapy (Signed)
OUTPATIENT PHYSICAL THERAPY CERVICAL TREATMENT- RECERTIFICATION   Patient Name: Tina Mckee MRN: [id number] DOB:[birth year], 32 y.o., female Today's Date: 04/01/2023  END OF SESSION:  PT End of Session - 04/01/23 0801     Visit Number 4    Number of Visits 13   Plus eval   Date for PT Re-Evaluation 05/25/23    Authorization Type BCBS (30 VL combined w/OT)    PT Start Time 0800    PT Stop Time 0843    PT Time Calculation (min) 43 min    Equipment Utilized During Treatment --    Activity Tolerance Patient tolerated treatment well    Behavior During Therapy WFL for tasks assessed/performed                Past Medical History:  Diagnosis Date   Asthma    Gestational hypertension    Perinatal arterial ischemic stroke    Scoliosis    Sleep apnea    Stroke birth   rt hemiparesis  Perinatal   Supervision of other normal pregnancy 05/04/2014   Past Surgical History:  Procedure Laterality Date   FOOT SURGERY     rt foot   FOOT SURGERY     Bone placed   moles removed     from back   Patient Active Problem List   Diagnosis Date Noted   Diastolic hypertension 03/17/2023   Morbid obesity 03/17/2023   Hydradenitis 10/14/2022   OSA (obstructive sleep apnea) 06/03/2020   Insomnia 06/03/2020   Hemiparesis affecting right side as late effect of cerebrovascular accident (HCC) 02/06/2016   Pigmented skin lesion of uncertain nature 02/20/2015   Hyperlipidemia 02/20/2015   Visit for preventive health examination 02/20/2015   Hx of fall 02/20/2015   Encounter for IUD insertion 02/12/2015   Cystic 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, antepartum 11/14/2014   Rubella non-immune status, antepartum 07/31/2014   History of stroke 05/04/2014   Obesity (BMI 30-39.9) 09/29/2013   Thrombocytosis 10/01/2012   High blood triglycerides 09/27/2012   Abnormal LFTs 09/27/2012   Weight gain 09/27/2012   Shaking 04/22/2012   Hyperglycemia 09/23/2011   Abnormal glucose 09/21/2011   Falling episodes  09/21/2011   Cervical radicular pain 08/03/2011   Wrist pain 02/16/2011   Hemiparesis, right 02/16/2011   SCOLIOSIS 01/16/2009    PCP: Madelin Headings, MD  REFERRING PROVIDER: Drema Dallas, DO  REFERRING DIAG: [phone] (ICD-10-CM) - Left cervical radiculopathy; R26.89 (ICD-10-CM) - Other abnormalities of gait and mobility   THERAPY DIAG:  Other abnormalities of gait and mobility  Muscle weakness (generalized)  Unsteadiness on feet  Rationale for Evaluation and Treatment: Rehabilitation  ONSET DATE: 02/19/2023 (referral)  SUBJECTIVE:  SUBJECTIVE STATEMENT: Pt presents to clinic without knee brace. LUE is numb today. No falls.    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  For several years, she will fall.  It will occur every 3 months or so.  She says her right leg will just give out.  She does report some right knee pain.  Since around 2013, she has had arrhythmic movements of her right upper extremity and right foot.  She was evaluated by neurology for these movements at that time.  At that time, she also exhibited jerking in the left hand.  MRI of brain on 06/02/2012 personally reviewed revealed old left MCA stroke but no new findings.  She had a routine EEG that revealed intermittent arrhythmic theta and delta slowing in the left central parietal region that appeared to correlate with right arm and leg jerking.  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  She was started on Keppra but discontinued due to dizziness and was instead started on clonazepam.  She was lost to follow up.    She continues to have involuntary right sided  movements.  It occurs daily throughout the day, at rest or while in bed.  She says the falls are not associated with involuntary movement of the right lower extremity.    PAIN:  Are you having pain? Yes: NPRS scale: 6/10 Pain location: LUE and back Pain description: Tingly, little knives stabbing arm  Aggravating factors: Sleeping on L side Relieving factors: None  PRECAUTIONS: Fall  WEIGHT BEARING RESTRICTIONS: No  FALLS:  Has patient fallen in last 6 months? Yes. Number of falls 2  LIVING ENVIRONMENT: Lives with: lives with their family and lives with their spouse Lives in: House/apartment Stairs: Yes: External: 3-5 in front and back steps; can reach both and only uses R side Has following equipment at home:  knee brace  OCCUPATION: Unemployed   PLOF: Independent  PATIENT GOALS: "Getting better control of my body"   OBJECTIVE:   DIAGNOSTIC FINDINGS:  MRI of brain on 02/20/2016  IMPRESSION: 1. Unchanged appearance of the brain without acute abnormality or mass. 2. Chronic left MCA infarct.  VITALS  Vitals:   04/01/23 0807  BP: (!) 116/92  Pulse: 79      PATIENT SURVEYS: Taken on Eval  NDI 22/50 (moderate disability) Myelopathy Disability Index: 11/30   COGNITION: Overall cognitive status: Within functional limits for tasks assessed  SENSATION: Frequent numbness of LUE   POSTURE: rounded shoulders, forward head, and excessive supination of R foot in seated position    CERVICAL ROM: Tested in seated position   Active ROM A/PROM (deg) eval  Flexion 38  Extension 51  Right lateral flexion 51  Left lateral flexion 31  Right rotation 70  Left rotation 65   (Blank rows = not tested)   TODAY'S TREATMENT:            Ther Act Attempted to don [employer] Rx Reaction, but unable to fit into pt's shoes. Educated pt on importance of proper footwear, as she buys a shoe that is very tight on her R foot to provide more ankle stability. Informed pt that she  will need larger shoe to fit brace inside and showed pt Billy shoes that are designed for AFOs and for people w/impaired dexterity. Pt to bring different shoes next appointment to better fit an AFO to trial  in clinic prior to going to [facility].   Ther Ex Added to HEP for improved periscapular strength, nerve glides and LUE pain modulation:  Bent over single arm rows using 5# DB, x10 per side  Doorway Pec stretch, x90s per side  Seated median nerve flossing, x10 reps. Pt symptomatic with this. Noted significant tightness in L wrist flexors, so added wrist flexor stretch as well w/pt reported felt better.                                                                                                                   PATIENT EDUCATION:  Education details: Additions to HEP, importance of good footwear  Person educated: Patient Education method: Explanation, Demonstration, and Handouts Education comprehension: verbalized understanding and returned demonstration  HOME EXERCISE PROGRAM: Access Code: [email or web address] URL: [email or web address] Date: 03/25/2023 Prepared by: Alethia Berthold Kyree Fedorko  Exercises - Cervical Retraction at Wall  - 1 x daily - 7 x weekly - 3 sets - 10 reps - Forward Step Down Touch with Toe  - 1 x daily - 7 x weekly - 3 sets - 10 reps - Forward Step Up  - 1 x daily - 7 x weekly - 3 sets - 10 reps - Standing Bent Over Single Arm Shoulder Row  - 1 x daily - 7 x weekly - 3 sets - 10 reps - Single Arm Doorway Pec Stretch at 90 Degrees Abduction  - 1 x daily - 7 x weekly - 3 sets - 10 reps - Median Nerve Flossing - Tray  - 1 x daily - 7 x weekly - 3 sets - 10 reps - Wrist Flexor Stretch on Table  - 1 x daily - 7 x weekly - 3-4 reps - 30-45 second  hold  ASSESSMENT:  CLINICAL IMPRESSION: Emphasis of skilled PT session on pt education and adding to HEP for LUE pain/weakness. Pt does not have tennis shoes large enough to trial an anterior guard AFO into, so pt to bring a  different pair next week to trial in clinic prior to going to [facility]. Pt most symptomatic in LUE w/median nerve flossing and noted significant tightness in L wrist flexors as well. With wrist flexor stretch and nerve glides, pt reported decrease in LUE pain. Obtained new referral from Dr. Everlena Cooper to address impaired balance, will send recert today. Continue POC.    OBJECTIVE IMPAIRMENTS: Abnormal gait, decreased activity tolerance, decreased balance, decreased coordination, decreased endurance, decreased mobility, difficulty walking, decreased strength, impaired sensation, impaired UE functional use, improper body mechanics, and pain   ACTIVITY LIMITATIONS: carrying, lifting, bending, standing, squatting, sleeping, stairs, transfers, bathing, locomotion level, and caring for others  PARTICIPATION LIMITATIONS: meal prep, cleaning, laundry, driving, shopping, community activity, occupation, and yard work  PERSONAL FACTORS: Age, Fitness, Past/current experiences, and 1 comorbidity: perinatal CVA  are also affecting patient's functional outcome.   REHAB POTENTIAL: Good  CLINICAL DECISION MAKING: Evolving/moderate complexity  EVALUATION COMPLEXITY: Moderate   GOALS: Goals reviewed with patient? Yes  SHORT TERM GOALS: Target  date: 04/20/2023  Pt will be independent with initial HEP for improved strength, balance, transfers and gait.  Baseline: Not established on eval  Goal status: INITIAL  2.  Pt will trial various braces on RLE to determine safest option for mobility and maximize independence.  Baseline:  Goal status: INITIAL  3.  MiniBest to be assessed and LTG written  Baseline: 22/28 Goal status: REVISED  4.  Pt will improve cervical left lateral flexion by >/= 10 degrees for improved functional ROM  Baseline: 31 degrees  Goal status: INITIAL  5. Pt will improve gait velocity to at least 3.5 ft/s with or without AFO for improved gait efficiency and independence   Baseline: 3.2  ft/s no AFO Goal status: REVISED   LONG TERM GOALS: Target date: 05/18/2023   Pt will be independent with final HEP for improved strength, balance, transfers and gait.  Baseline:  Goal status: INITIAL  2.  Pt will improve MiniBest to 25/28 for decreased fall risk and improvement with compensatory stepping strategies.   Baseline: 22/28 Goal status: REVISED  3.  Pt will improve NDI to >/= 15/50 for decreased pain levels and improved independence w/ADLs Baseline: 22/50 Goal status: INITIAL  4.  Pt will improve gait velocity to at least 3.8 ft/s with or without AFO for improved gait efficiency   Baseline: 3.2 ft/s no AFO Goal status: REVISED    PLAN:  PT FREQUENCY:  2/week for 4 weeks and then 1x/week for 4 weeks  PT DURATION: 8 weeks  PLANNED INTERVENTIONS: Therapeutic exercises, Therapeutic activity, Neuromuscular re-education, Balance training, Gait training, Patient/Family education, Self Care, Joint mobilization, Joint manipulation, Stair training, Vestibular training, Canalith repositioning, Orthotic/Fit training, DME instructions, Dry Needling, Manual therapy, and Re-evaluation  PLAN FOR NEXT SESSION:Trial AFOs on RLE. Add to HEP for quad strength, cervical ROM and overall strength. Lifting   Jill Alexanders Julis Haubner, PT, DPT 04/01/2023, 8:44 AM

## 2023-04-02 ENCOUNTER — Other Ambulatory Visit: Payer: Self-pay

## 2023-04-02 DIAGNOSIS — R29898 Other symptoms and signs involving the musculoskeletal system: Secondary | ICD-10-CM

## 2023-04-06 ENCOUNTER — Ambulatory Visit: Payer: BC Managed Care – PPO | Admitting: Physical Therapy

## 2023-04-06 VITALS — BP 128/94 | HR 71

## 2023-04-06 DIAGNOSIS — M6281 Muscle weakness (generalized): Secondary | ICD-10-CM | POA: Diagnosis not present

## 2023-04-06 DIAGNOSIS — R2681 Unsteadiness on feet: Secondary | ICD-10-CM | POA: Diagnosis not present

## 2023-04-06 DIAGNOSIS — M5412 Radiculopathy, cervical region: Secondary | ICD-10-CM | POA: Diagnosis not present

## 2023-04-06 DIAGNOSIS — M79602 Pain in left arm: Secondary | ICD-10-CM | POA: Diagnosis not present

## 2023-04-06 DIAGNOSIS — R2689 Other abnormalities of gait and mobility: Secondary | ICD-10-CM

## 2023-04-06 NOTE — Therapy (Signed)
OUTPATIENT PHYSICAL THERAPY CERVICAL/LOWER EXTREMITY TREATMENT   Patient Name: Tina Mckee MRN: [id number] DOB:1991-02-16, 32 y.o., female Today's Date: 04/06/2023  END OF SESSION:  PT End of Session - 04/06/23 0804     Visit Number 5    Number of Visits 13   Plus eval   Date for PT Re-Evaluation 05/25/23    Authorization Type [employer] (30 VL combined w/OT)    PT Start Time 0802    PT Stop Time 0845    PT Time Calculation (min) 43 min    Equipment Utilized During Treatment Other (comment)   R knee brace   Activity Tolerance Patient tolerated treatment well    Behavior During Therapy WFL for tasks assessed/performed                 Past Medical History:  Diagnosis Date   Asthma    Gestational hypertension    Perinatal arterial ischemic stroke    Scoliosis    Sleep apnea    Stroke birth   rt hemiparesis  Perinatal   Supervision of other normal pregnancy 05/04/2014   Past Surgical History:  Procedure Laterality Date   FOOT SURGERY     rt foot   FOOT SURGERY     Bone placed   moles removed     from back   Patient Active Problem List   Diagnosis Date Noted   Diastolic hypertension 03/17/2023   Morbid obesity 03/17/2023   Hydradenitis 10/14/2022   OSA (obstructive sleep apnea) 06/03/2020   Insomnia 06/03/2020   Hemiparesis affecting right side as late effect of cerebrovascular accident (HCC) 02/06/2016   Pigmented skin lesion of uncertain nature 02/20/2015   Hyperlipidemia 02/20/2015   Visit for preventive health examination 02/20/2015   Hx of fall 02/20/2015   Encounter for IUD insertion 02/12/2015   Cystic 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, antepartum 11/14/2014   Rubella non-immune status, antepartum 07/31/2014   History of stroke 05/04/2014   Obesity (BMI 30-39.9) 09/29/2013   Thrombocytosis 10/01/2012   High blood triglycerides 09/27/2012   Abnormal LFTs 09/27/2012   Weight gain 09/27/2012   Shaking 04/22/2012   Hyperglycemia 09/23/2011   Abnormal glucose  09/21/2011   Falling episodes 09/21/2011   Cervical radicular pain 08/03/2011   Wrist pain 02/16/2011   Hemiparesis, right 02/16/2011   SCOLIOSIS 01/16/2009    PCP: Madelin Headings, MD  REFERRING PROVIDER: Drema Dallas, DO  REFERRING DIAG: [phone] (ICD-10-CM) - Left cervical radiculopathy; R26.89 (ICD-10-CM) - Other abnormalities of gait and mobility   THERAPY DIAG:  Other abnormalities of gait and mobility  Muscle weakness (generalized)  Unsteadiness on feet  Rationale for Evaluation and Treatment: Rehabilitation  ONSET DATE: 02/19/2023 (referral)  SUBJECTIVE:  SUBJECTIVE STATEMENT: Pt presents to clinic with knee brace. [place] part of a field trip on Friday and did well until stepping up on the tram, in which she twisted her R knee and it popped. Is painful and swollen but has still been doing her HEP. The median nerve flossing has been helpful, woke up without her LUE being numb for first time.    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  For several years, she will fall.  It will occur every 3 months or so.  She says her right leg will just give out.  She does report some right knee pain.  Since around 2013, she has had arrhythmic movements of her right upper extremity and right foot.  She was evaluated by neurology for these movements at that time.  At that time, she also exhibited jerking in the left hand.  MRI of brain on 06/02/2012 personally reviewed revealed old left MCA stroke but no new findings.  She had a routine EEG that revealed intermittent arrhythmic theta and delta slowing in the left central parietal region that appeared to  correlate with right arm and leg jerking.  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  She was started on Keppra but discontinued due to dizziness and was instead started on clonazepam.  She was lost to follow up.    She continues to have involuntary right sided movements.  It occurs daily throughout the day, at rest or while in bed.  She says the falls are not associated with involuntary movement of the right lower extremity.    PAIN:  Are you having pain? Yes: NPRS scale: 7/10 Pain location: R knee  Pain description: Achy Aggravating factors: Weightbearing  Relieving factors: None  PRECAUTIONS: Fall  WEIGHT BEARING RESTRICTIONS: No  FALLS:  Has patient fallen in last 6 months? Yes. Number of falls 2  LIVING ENVIRONMENT: Lives with: lives with their family and lives with their spouse Lives in: House/apartment Stairs: Yes: External: 3-5 in front and back steps; can reach both and only uses R side Has following equipment at home:  knee brace  OCCUPATION: Unemployed   PLOF: Independent  PATIENT GOALS: "Getting better control of my body"   OBJECTIVE:   DIAGNOSTIC FINDINGS:  MRI of brain on 02/20/2016  IMPRESSION: 1. Unchanged appearance of the brain without acute abnormality or mass. 2. Chronic left MCA infarct.  VITALS  Vitals:   04/06/23 0816  BP: (!) 128/94  Pulse: 71      PATIENT SURVEYS: Taken on Eval  NDI 22/50 (moderate disability) Myelopathy Disability Index: 11/30   COGNITION: Overall cognitive status: Within functional limits for tasks assessed  SENSATION: Frequent numbness of LUE   POSTURE: rounded shoulders, forward head, and excessive supination of R foot in seated position    CERVICAL ROM: Tested in seated position   Active ROM A/PROM (deg) eval  Flexion 38  Extension 51  Right lateral flexion 51  Left lateral flexion 31  Right rotation 70  Left rotation 65   (Blank rows = not  tested)   TODAY'S TREATMENT:            Gait Training   Gait pattern: step through pattern, decreased arm swing- Right, decreased arm swing- Left, antalgic, lateral hip instability, decreased trunk rotation, and wide BOS Distance walked: 230'  Assistive device utilized:  R [place] WalkOn Reaction AFO Level of assistance: SBA Comments: Trialed anterior guard AFO but pt reported feeling "off". Did not note genu recurvatum w/brace but pt ambulating very slowly and cautiously. Pt denied increase in knee pain but ambulated w/her knee locked in extension.   Gait pattern: step through pattern and lateral hip instability Distance walked: 115' Assistive device utilized:  R [place] WalkOn AFO  Level of assistance: Modified independence Comments: Retried classic medial strut AFO that pt preferred from last week and pt demonstrates significant improvement in gait kinematics and speed with this brace. Pt reports increased comfort and feel of this brace, so will plan to pursue this AFO.   Ther Ex Modified plantigrade thread the needles, x8 per side, for improved thoracic rotation and stabilization on BUEs. Pt performed well on R side but had numbness/tingling when performing w/ LUE, so did not add to HEP.  Mini goblet squats w/5# KB, x15 reps, for improved BLE strength and core stability. Pt performed well w/no knee pain and demonstrated good posture throughout. Pt enjoyed this exercise, so encouraged pt to obtain a KB if able that weighs >10# for use at home.  KB DL Z/6# KB, X09 reps, for improved posterior chain strength and proper lifting technique. Pt also enjoyed this exercise, no instability noted throughout.                                                                                            PATIENT EDUCATION:  Education details: Plan for [facility] appointment, continue HEP, encouragement to obtain KB Person educated: Patient Education method: Medical illustrator Education  comprehension: verbalized understanding and returned demonstration  HOME EXERCISE PROGRAM: Access Code: [email or web address] URL: [email or web address] Date: 03/25/2023 Prepared by: Alethia Berthold Mazella Deen  Exercises - Cervical Retraction at Wall  - 1 x daily - 7 x weekly - 3 sets - 10 reps - Forward Step Down Touch with Toe  - 1 x daily - 7 x weekly - 3 sets - 10 reps - Forward Step Up  - 1 x daily - 7 x weekly - 3 sets - 10 reps - Standing Bent Over Single Arm Shoulder Row  - 1 x daily - 7 x weekly - 3 sets - 10 reps - Single Arm Doorway Pec Stretch at 90 Degrees Abduction  - 1 x daily - 7 x weekly - 3 sets - 10 reps - Median Nerve Flossing - Tray  - 1 x daily - 7 x weekly - 3 sets - 10 reps - Wrist Flexor Stretch on Table  - 1 x daily - 7 x weekly - 3-4 reps - 30-45 second  hold  ASSESSMENT:  CLINICAL IMPRESSION: Emphasis of skilled PT session on gait training w/various AFOs and BLE strength. Despite higher pain levels in R knee, pt tolerated session well w/no instability noted. Pt ambulates w/increased velocity and kinematics when wearing proper AFO on RLE without pain, so suspect knee pain is direct result of history of R ankle instability. Pt enjoyed weightlifting exercises and no instability noted, so will continue to incorporate strength training into POC. Pt states median nerve  flossing has improved her LUE pain significantly as well. Continue POC.   OBJECTIVE IMPAIRMENTS: Abnormal gait, decreased activity tolerance, decreased balance, decreased coordination, decreased endurance, decreased mobility, difficulty walking, decreased strength, impaired sensation, impaired UE functional use, improper body mechanics, and pain   ACTIVITY LIMITATIONS: carrying, lifting, bending, standing, squatting, sleeping, stairs, transfers, bathing, locomotion level, and caring for others  PARTICIPATION LIMITATIONS: meal prep, cleaning, laundry, driving, shopping, community activity, occupation, and yard  work  PERSONAL FACTORS: Age, Fitness, Past/current experiences, and 1 comorbidity: perinatal CVA  are also affecting patient's functional outcome.   REHAB POTENTIAL: Good  CLINICAL DECISION MAKING: Evolving/moderate complexity  EVALUATION COMPLEXITY: Moderate   GOALS: Goals reviewed with patient? Yes  SHORT TERM GOALS: Target date: 04/20/2023  Pt will be independent with initial HEP for improved strength, balance, transfers and gait.  Baseline: Not established on eval  Goal status: INITIAL  2.  Pt will trial various braces on RLE to determine safest option for mobility and maximize independence.  Baseline:  Goal status: INITIAL  3.  MiniBest to be assessed and LTG written  Baseline: 22/28 Goal status: REVISED  4.  Pt will improve cervical left lateral flexion by >/= 10 degrees for improved functional ROM  Baseline: 31 degrees  Goal status: INITIAL  5. Pt will improve gait velocity to at least 3.5 ft/s with or without AFO for improved gait efficiency and independence   Baseline: 3.2 ft/s no AFO Goal status: REVISED   LONG TERM GOALS: Target date: 05/18/2023   Pt will be independent with final HEP for improved strength, balance, transfers and gait.  Baseline:  Goal status: INITIAL  2.  Pt will improve MiniBest to 25/28 for decreased fall risk and improvement with compensatory stepping strategies.   Baseline: 22/28 Goal status: REVISED  3.  Pt will improve NDI to >/= 15/50 for decreased pain levels and improved independence w/ADLs Baseline: 22/50 Goal status: INITIAL  4.  Pt will improve gait velocity to at least 3.8 ft/s with or without AFO for improved gait efficiency   Baseline: 3.2 ft/s no AFO Goal status: REVISED    PLAN:  PT FREQUENCY:  2/week for 4 weeks and then 1x/week for 4 weeks  PT DURATION: 8 weeks  PLANNED INTERVENTIONS: Therapeutic exercises, Therapeutic activity, Neuromuscular re-education, Balance training, Gait training, Patient/Family  education, Self Care, Joint mobilization, Joint manipulation, Stair training, Vestibular training, Canalith repositioning, Orthotic/Fit training, DME instructions, Dry Needling, Manual therapy, and Re-evaluation  PLAN FOR NEXT SESSION: Add to HEP for quad strength, cervical ROM and overall strength. Lifting, plyometrics, floor transfer    Jill Alexanders Lacorey Brusca, PT, DPT 04/06/2023, 8:45 AM

## 2023-04-08 ENCOUNTER — Ambulatory Visit: Payer: BC Managed Care – PPO | Admitting: Physical Therapy

## 2023-04-08 VITALS — BP 119/91 | HR 74

## 2023-04-08 DIAGNOSIS — R2689 Other abnormalities of gait and mobility: Secondary | ICD-10-CM | POA: Diagnosis not present

## 2023-04-08 DIAGNOSIS — M79602 Pain in left arm: Secondary | ICD-10-CM | POA: Diagnosis not present

## 2023-04-08 DIAGNOSIS — R2681 Unsteadiness on feet: Secondary | ICD-10-CM | POA: Diagnosis not present

## 2023-04-08 DIAGNOSIS — M6281 Muscle weakness (generalized): Secondary | ICD-10-CM | POA: Diagnosis not present

## 2023-04-08 DIAGNOSIS — M5412 Radiculopathy, cervical region: Secondary | ICD-10-CM | POA: Diagnosis not present

## 2023-04-08 NOTE — Therapy (Signed)
OUTPATIENT PHYSICAL THERAPY CERVICAL/LOWER EXTREMITY TREATMENT   Patient Name: Tina Mckee MRN: [id number] DOB:10-09-1991, 32 y.o., female Today's Date: 04/08/2023  END OF SESSION:  PT End of Session - 04/08/23 0802     Visit Number 6    Number of Visits 13   Plus eval   Date for PT Re-Evaluation 05/25/23    Authorization Type [employer] (30 VL combined w/OT)    PT Start Time 0800    PT Stop Time 0845    PT Time Calculation (min) 45 min    Equipment Utilized During Treatment Other (comment);Gait belt   R knee brace   Activity Tolerance Patient tolerated treatment well    Behavior During Therapy WFL for tasks assessed/performed                  Past Medical History:  Diagnosis Date   Asthma    Gestational hypertension    Perinatal arterial ischemic stroke    Scoliosis    Sleep apnea    Stroke birth   rt hemiparesis  Perinatal   Supervision of other normal pregnancy 05/04/2014   Past Surgical History:  Procedure Laterality Date   FOOT SURGERY     rt foot   FOOT SURGERY     Bone placed   moles removed     from back   Patient Active Problem List   Diagnosis Date Noted   Diastolic hypertension 03/17/2023   Morbid obesity 03/17/2023   Hydradenitis 10/14/2022   OSA (obstructive sleep apnea) 06/03/2020   Insomnia 06/03/2020   Hemiparesis affecting right side as late effect of cerebrovascular accident (HCC) 02/06/2016   Pigmented skin lesion of uncertain nature 02/20/2015   Hyperlipidemia 02/20/2015   Visit for preventive health examination 02/20/2015   Hx of fall 02/20/2015   Encounter for IUD insertion 02/12/2015   Cystic 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, antepartum 11/14/2014   Rubella non-immune status, antepartum 07/31/2014   History of stroke 05/04/2014   Obesity (BMI 30-39.9) 09/29/2013   Thrombocytosis 10/01/2012   High blood triglycerides 09/27/2012   Abnormal LFTs 09/27/2012   Weight gain 09/27/2012   Shaking 04/22/2012   Hyperglycemia 09/23/2011   Abnormal  glucose 09/21/2011   Falling episodes 09/21/2011   Cervical radicular pain 08/03/2011   Wrist pain 02/16/2011   Hemiparesis, right 02/16/2011   SCOLIOSIS 01/16/2009    PCP: Madelin Headings, MD  REFERRING PROVIDER: Drema Dallas, DO  REFERRING DIAG: [phone] (ICD-10-CM) - Left cervical radiculopathy; R26.89 (ICD-10-CM) - Other abnormalities of gait and mobility   THERAPY DIAG:  Other abnormalities of gait and mobility  Muscle weakness (generalized)  Unsteadiness on feet  Rationale for Evaluation and Treatment: Rehabilitation  ONSET DATE: 02/19/2023 (referral)  SUBJECTIVE:  SUBJECTIVE STATEMENT: Pt presents to clinic with knee brace. Knee is still tender but better than last session. No falls. Has not heard from [facility].    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  For several years, she will fall.  It will occur every 3 months or so.  She says her right leg will just give out.  She does report some right knee pain.  Since around 2013, she has had arrhythmic movements of her right upper extremity and right foot.  She was evaluated by neurology for these movements at that time.  At that time, she also exhibited jerking in the left hand.  MRI of brain on 06/02/2012 personally reviewed revealed old left MCA stroke but no new findings.  She had a routine EEG that revealed intermittent arrhythmic theta and delta slowing in the left central parietal region that appeared to correlate with right arm and leg jerking.  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  She was started on Keppra but discontinued due to dizziness and was instead started on  clonazepam.  She was lost to follow up.    She continues to have involuntary right sided movements.  It occurs daily throughout the day, at rest or while in bed.  She says the falls are not associated with involuntary movement of the right lower extremity.    PAIN:  Are you having pain? Yes: NPRS scale: 5/10 Pain location: R knee  Pain description: Achy Aggravating factors: Weightbearing  Relieving factors: None  PRECAUTIONS: Fall  WEIGHT BEARING RESTRICTIONS: No  FALLS:  Has patient fallen in last 6 months? Yes. Number of falls 2  LIVING ENVIRONMENT: Lives with: lives with their family and lives with their spouse Lives in: House/apartment Stairs: Yes: External: 3-5 in front and back steps; can reach both and only uses R side Has following equipment at home:  knee brace  OCCUPATION: Unemployed   PLOF: Independent  PATIENT GOALS: "Getting better control of my body"   OBJECTIVE:   DIAGNOSTIC FINDINGS:  MRI of brain on 02/20/2016  IMPRESSION: 1. Unchanged appearance of the brain without acute abnormality or mass. 2. Chronic left MCA infarct.  VITALS  Vitals:   04/08/23 0807  BP: (!) 119/91  Pulse: 74     PATIENT SURVEYS: Taken on Eval  NDI 22/50 (moderate disability) Myelopathy Disability Index: 11/30   COGNITION: Overall cognitive status: Within functional limits for tasks assessed  SENSATION: Frequent numbness of LUE   POSTURE: rounded shoulders, forward head, and excessive supination of R foot in seated position    CERVICAL ROM: Tested in seated position   Active ROM A/PROM (deg) eval  Flexion 38  Extension 51  Right lateral flexion 51  Left lateral flexion 31  Right rotation 70  Left rotation 65   (Blank rows = not tested)   TODAY'S TREATMENT:            NMR  In // bars for improved LE coordination, single leg stability and hip/glute/quad strength in both concentric and eccentric positions:  Using 6 blaze pods on random reach setting for 2  minute intervals:  Round 1: pods in straight line, 31 hits w/o UE support. RPE of 8/10  Round 2: arranged in circle, 31 hits w/o UE support. RPE of 7/10  Round 3: Arranged in circle while on airex, 24 hits w/CGA and intermittent UE support.  RPE of 10/10.  W/BUE support, modified jumping jacks x8 for improved power, hip strength and LE coordination. Pt had difficulty w/complete step clearance w/this, but was able to coordinate the jump well w/o knee or ankle pain.  Lateral eccentric heel taps from 6" box w/BUE support, x10 per side. Pt w/increased difficulty performing on RLE but noted good knee position throughout.  Fwd eccentric heel taps from 6" box w/BUE support, x10 per side. Pt reported fwd direction easier than lateral direction.  RPE of 10/10 following session                                                                                             PATIENT EDUCATION:  Education details: Continue POC  Person educated: Patient Education method: Medical illustrator Education comprehension: verbalized understanding and returned demonstration  HOME EXERCISE PROGRAM: Access Code: [email or web address] URL: [email or web address] Date: 03/25/2023 Prepared by: Alethia Berthold Craige Patel  Exercises - Cervical Retraction at Wall  - 1 x daily - 7 x weekly - 3 sets - 10 reps - Forward Step Down Touch with Toe  - 1 x daily - 7 x weekly - 3 sets - 10 reps - Forward Step Up  - 1 x daily - 7 x weekly - 3 sets - 10 reps - Standing Bent Over Single Arm Shoulder Row  - 1 x daily - 7 x weekly - 3 sets - 10 reps - Single Arm Doorway Pec Stretch at 90 Degrees Abduction  - 1 x daily - 7 x weekly - 3 sets - 10 reps - Median Nerve Flossing - Tray  - 1 x daily - 7 x weekly - 3 sets - 10 reps - Wrist Flexor Stretch on Table  - 1 x daily - 7 x weekly - 3-4 reps - 30-45 second  hold  ASSESSMENT:  CLINICAL IMPRESSION: Emphasis of skilled PT session on BLE strength, LE coordination and power. Pt tolerated  session well w/no increase in knee pain. Pt most challenged by sidestepping and eccentric control of glutes/quads bilaterally. However, no instability noted. Pt most unstable at R ankle, leading to frequent twisting of R knee. With bracing and potential heel lift, pt to be much more stable w/reduced pain levels. Continue POC.   OBJECTIVE IMPAIRMENTS: Abnormal gait, decreased activity tolerance, decreased balance, decreased coordination, decreased endurance, decreased mobility, difficulty walking, decreased strength, impaired sensation, impaired UE functional use, improper body mechanics, and pain   ACTIVITY LIMITATIONS: carrying, lifting, bending, standing, squatting, sleeping, stairs, transfers, bathing, locomotion level, and caring for others  PARTICIPATION LIMITATIONS: meal prep, cleaning, laundry, driving, shopping, community activity, occupation, and yard work  PERSONAL FACTORS: Age, Fitness, Past/current experiences, and 1 comorbidity: perinatal CVA  are also affecting patient's functional outcome.   REHAB POTENTIAL: Good  CLINICAL DECISION MAKING: Evolving/moderate complexity  EVALUATION COMPLEXITY: Moderate   GOALS: Goals reviewed with patient? Yes  SHORT TERM GOALS: Target date: 04/20/2023  Pt will be independent with initial HEP for improved strength, balance, transfers and gait.  Baseline: Not established on eval  Goal status: INITIAL  2.  Pt will trial various braces on RLE to determine safest  option for mobility and maximize independence.  Baseline:  Goal status: INITIAL  3.  MiniBest to be assessed and LTG written  Baseline: 22/28 Goal status: REVISED  4.  Pt will improve cervical left lateral flexion by >/= 10 degrees for improved functional ROM  Baseline: 31 degrees  Goal status: INITIAL  5. Pt will improve gait velocity to at least 3.5 ft/s with or without AFO for improved gait efficiency and independence   Baseline: 3.2 ft/s no AFO Goal status:  REVISED   LONG TERM GOALS: Target date: 05/18/2023   Pt will be independent with final HEP for improved strength, balance, transfers and gait.  Baseline:  Goal status: INITIAL  2.  Pt will improve MiniBest to 25/28 for decreased fall risk and improvement with compensatory stepping strategies.   Baseline: 22/28 Goal status: REVISED  3.  Pt will improve NDI to >/= 15/50 for decreased pain levels and improved independence w/ADLs Baseline: 22/50 Goal status: INITIAL  4.  Pt will improve gait velocity to at least 3.8 ft/s with or without AFO for improved gait efficiency   Baseline: 3.2 ft/s no AFO Goal status: REVISED    PLAN:  PT FREQUENCY:  2/week for 4 weeks and then 1x/week for 4 weeks  PT DURATION: 8 weeks  PLANNED INTERVENTIONS: Therapeutic exercises, Therapeutic activity, Neuromuscular re-education, Balance training, Gait training, Patient/Family education, Self Care, Joint mobilization, Joint manipulation, Stair training, Vestibular training, Canalith repositioning, Orthotic/Fit training, DME instructions, Dry Needling, Manual therapy, and Re-evaluation  PLAN FOR NEXT SESSION: Add to HEP for quad strength, cervical ROM and overall strength. Lifting, plyometrics, floor transfer, leg presses, BAPS board    Jill Alexanders Yunis Voorheis, PT, DPT 04/08/2023, 8:46 AM

## 2023-04-10 ENCOUNTER — Other Ambulatory Visit: Payer: Self-pay | Admitting: Internal Medicine

## 2023-04-11 NOTE — Telephone Encounter (Signed)
So  send in a few days of readings  assuming you are taking the amlodipine.5?  (If still up we may have you increase to 5 mg per day)

## 2023-04-13 ENCOUNTER — Telehealth: Payer: Self-pay | Admitting: Physical Therapy

## 2023-04-13 ENCOUNTER — Ambulatory Visit: Payer: BC Managed Care – PPO | Admitting: Physical Therapy

## 2023-04-13 VITALS — BP 129/92 | HR 71

## 2023-04-13 DIAGNOSIS — M79602 Pain in left arm: Secondary | ICD-10-CM | POA: Diagnosis not present

## 2023-04-13 DIAGNOSIS — M5412 Radiculopathy, cervical region: Secondary | ICD-10-CM

## 2023-04-13 DIAGNOSIS — M6281 Muscle weakness (generalized): Secondary | ICD-10-CM | POA: Diagnosis not present

## 2023-04-13 DIAGNOSIS — R2681 Unsteadiness on feet: Secondary | ICD-10-CM

## 2023-04-13 DIAGNOSIS — R2689 Other abnormalities of gait and mobility: Secondary | ICD-10-CM

## 2023-04-13 NOTE — Therapy (Signed)
OUTPATIENT PHYSICAL THERAPY CERVICAL/LOWER EXTREMITY TREATMENT   Patient Name: Tina Mckee MRN: [id number] DOB:[birth year], 32 y.o., female Today's Date: 04/13/2023  END OF SESSION:  PT End of Session - 04/13/23 0804     Visit Number 7    Number of Visits 13   Plus eval   Date for PT Re-Evaluation 05/25/23    Authorization Type BCBS (30 VL combined w/OT)    PT Start Time 0802    PT Stop Time 0846    PT Time Calculation (min) 44 min    Equipment Utilized During Treatment Gait belt    Activity Tolerance Patient tolerated treatment well    Behavior During Therapy WFL for tasks assessed/performed                   Past Medical History:  Diagnosis Date   Asthma    Gestational hypertension    Perinatal arterial ischemic stroke (HCC)    Scoliosis    Sleep apnea    Stroke (HCC) birth   rt hemiparesis  Perinatal   Supervision of other normal pregnancy 05/04/2014   Past Surgical History:  Procedure Laterality Date   FOOT SURGERY     rt foot   FOOT SURGERY     Bone placed   moles removed     from back   Patient Active Problem List   Diagnosis Date Noted   Diastolic hypertension 03/17/2023   Morbid obesity (HCC) 03/17/2023   Hydradenitis 10/14/2022   OSA (obstructive sleep apnea) 06/03/2020   Insomnia 06/03/2020   Hemiparesis affecting right side as late effect of cerebrovascular accident (HCC) 02/06/2016   Pigmented skin lesion of uncertain nature 02/20/2015   Hyperlipidemia 02/20/2015   Visit for preventive health examination 02/20/2015   Hx of fall 02/20/2015   Encounter for IUD insertion 02/12/2015   Cystic 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, antepartum 11/14/2014   Rubella non-immune status, antepartum 07/31/2014   History of stroke 05/04/2014   Obesity (BMI 30-39.9) 09/29/2013   Thrombocytosis 10/01/2012   High blood triglycerides 09/27/2012   Abnormal LFTs 09/27/2012   Weight gain 09/27/2012   Shaking 04/22/2012   Hyperglycemia 09/23/2011   Abnormal glucose  09/21/2011   Falling episodes 09/21/2011   Cervical radicular pain 08/03/2011   Wrist pain 02/16/2011   Hemiparesis, right (HCC) 02/16/2011   SCOLIOSIS 01/16/2009    PCP: Madelin Headings, MD  REFERRING PROVIDER: Drema Dallas, DO  REFERRING DIAG: [phone] (ICD-10-CM) - Left cervical radiculopathy; R26.89 (ICD-10-CM) - Other abnormalities of gait and mobility   THERAPY DIAG:  Radiculopathy, cervical region  Other abnormalities of gait and mobility  Unsteadiness on feet  Muscle weakness (generalized)  Rationale for Evaluation and Treatment: Rehabilitation  ONSET DATE: 02/19/2023 (referral)  SUBJECTIVE:  SUBJECTIVE STATEMENT: Pt presents to clinic with new Billy shoes. Not wearing knee brace. HEP is going well, still looking for weights to use.    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  For several years, she will fall.  It will occur every 3 months or so.  She says her right leg will just give out.  She does report some right knee pain.  Since around 2013, she has had arrhythmic movements of her right upper extremity and right foot.  She was evaluated by neurology for these movements at that time.  At that time, she also exhibited jerking in the left hand.  MRI of brain on 06/02/2012 personally reviewed revealed old left MCA stroke but no new findings.  She had a routine EEG that revealed intermittent arrhythmic theta and delta slowing in the left central parietal region that appeared to correlate with right arm and leg jerking.  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  She was started on Keppra but discontinued due to dizziness and  was instead started on clonazepam.  She was lost to follow up.    She continues to have involuntary right sided movements.  It occurs daily throughout the day, at rest or while in bed.  She says the falls are not associated with involuntary movement of the right lower extremity.    PAIN:  Are you having pain? Yes: NPRS scale: 6/10 Pain location: LUE Pain description: Achy Aggravating factors: Weightbearing  Relieving factors: None  PRECAUTIONS: Fall  WEIGHT BEARING RESTRICTIONS: No  FALLS:  Has patient fallen in last 6 months? Yes. Number of falls 2  LIVING ENVIRONMENT: Lives with: lives with their family and lives with their spouse Lives in: House/apartment Stairs: Yes: External: 3-5 in front and back steps; can reach both and only uses R side Has following equipment at home:  knee brace  OCCUPATION: Unemployed   PLOF: Independent  PATIENT GOALS: "Getting better control of my body"   OBJECTIVE:   DIAGNOSTIC FINDINGS:  MRI of brain on 02/20/2016  IMPRESSION: 1. Unchanged appearance of the brain without acute abnormality or mass. 2. Chronic left MCA infarct.  VITALS  Vitals:   04/13/23 0807  BP: (!) 129/92  Pulse: 71      PATIENT SURVEYS: Taken on Eval  NDI 22/50 (moderate disability) Myelopathy Disability Index: 11/30   COGNITION: Overall cognitive status: Within functional limits for tasks assessed  SENSATION: Frequent numbness of LUE   POSTURE: rounded shoulders, forward head, and excessive supination of R foot in seated position    CERVICAL ROM: Tested in seated position   Active ROM A/PROM (deg) eval  Flexion 38  Extension 51  Right lateral flexion 51  Left lateral flexion 31  Right rotation 70  Left rotation 65   (Blank rows = not tested)   TODAY'S TREATMENT:            Ther Act Assessed BP (see above) and WNL for therapy  Discussed AFO order and pt reports she has not heard from [employer]. Therapist to call [employer] today.   Ther Ex   Seated radial and ulnar nerve glides of LUE, x10 reps, for pain modulation. Added to HEP (see bolded below) as pt reported LUE pain relief with this.  Seated upper trap stretch, x2 minute hold per side, for improved cervical ROM. Added to HEP (see bolded below).  Seated shoulder abduction w/orange theraband, x10 reps with 10s hold for anterior chest stretch and scapular retraction strength.  SciFit multi-peaks level 6 for 8 minutes using BUE/BLEs for global strength and dynamic cardiovascular conditioning. RPE of 5/10 following activity.                                                                                             PATIENT EDUCATION:  Education details: Updates to HEP  Person educated: Patient Education method: Medical illustrator Education comprehension: verbalized understanding and returned demonstration  HOME EXERCISE PROGRAM: Access Code: [email or web address] URL: [email or web address] Date: 03/25/2023 Prepared by: Alethia Berthold Jalayla Chrismer  Exercises - Cervical Retraction at Wall  - 1 x daily - 7 x weekly - 3 sets - 10 reps - Forward Step Down Touch with Toe  - 1 x daily - 7 x weekly - 3 sets - 10 reps - Forward Step Up  - 1 x daily - 7 x weekly - 3 sets - 10 reps - Standing Bent Over Single Arm Shoulder Row  - 1 x daily - 7 x weekly - 3 sets - 10 reps - Single Arm Doorway Pec Stretch at 90 Degrees Abduction  - 1 x daily - 7 x weekly - 3 sets - 10 reps - Median Nerve Flossing - Tray  - 1 x daily - 7 x weekly - 3 sets - 10 reps - Wrist Flexor Stretch on Table  - 1 x daily - 7 x weekly - 3-4 reps - 30-45 second  hold - Standing Radial Nerve Glide  - 1 x daily - 7 x weekly - 3 sets - 10 reps - Ulnar Nerve Flossing  - 1 x daily - 7 x weekly - 3 sets - 10 reps - Seated Upper Trapezius Stretch  - 1 x daily - 7 x weekly - 3-4 reps - 45-60 second hold  ASSESSMENT:  CLINICAL IMPRESSION: Emphasis of skilled PT session on improved cervical ROM, nerve glides for LUE and  periscapular strength. Pt tolerated session well despite higher pain levels. Pt reported most pain relief w/ulnar nerve glides, so added to HEP to pair with median nerve glide. Pt has not heard from [employer] this date regarding R AFO, so therapist to contact company today. Continue POC.   OBJECTIVE IMPAIRMENTS: Abnormal gait, decreased activity tolerance, decreased balance, decreased coordination, decreased endurance, decreased mobility, difficulty walking, decreased strength, impaired sensation, impaired UE functional use, improper body mechanics, and pain   ACTIVITY LIMITATIONS: carrying, lifting, bending, standing, squatting, sleeping, stairs, transfers, bathing, locomotion level, and caring for others  PARTICIPATION LIMITATIONS: meal prep, cleaning, laundry, driving, shopping, community activity, occupation, and yard work  PERSONAL FACTORS: Age, Fitness, Past/current experiences, and 1 comorbidity: perinatal CVA  are also affecting patient's functional outcome.   REHAB POTENTIAL: Good  CLINICAL DECISION MAKING: Evolving/moderate complexity  EVALUATION COMPLEXITY: Moderate   GOALS: Goals reviewed with patient? Yes  SHORT TERM GOALS: Target date: 04/20/2023  Pt will be independent with initial HEP for improved strength, balance, transfers and gait.  Baseline: Not established on eval  Goal status: INITIAL  2.  Pt will trial various  braces on RLE to determine safest option for mobility and maximize independence.  Baseline:  Goal status: INITIAL  3.  MiniBest to be assessed and LTG written  Baseline: 22/28 Goal status: REVISED  4.  Pt will improve cervical left lateral flexion by >/= 10 degrees for improved functional ROM  Baseline: 31 degrees  Goal status: INITIAL  5. Pt will improve gait velocity to at least 3.5 ft/s with or without AFO for improved gait efficiency and independence   Baseline: 3.2 ft/s no AFO Goal status: REVISED   LONG TERM GOALS: Target date:  05/18/2023   Pt will be independent with final HEP for improved strength, balance, transfers and gait.  Baseline:  Goal status: INITIAL  2.  Pt will improve MiniBest to 25/28 for decreased fall risk and improvement with compensatory stepping strategies.   Baseline: 22/28 Goal status: REVISED  3.  Pt will improve NDI to >/= 15/50 for decreased pain levels and improved independence w/ADLs Baseline: 22/50 Goal status: INITIAL  4.  Pt will improve gait velocity to at least 3.8 ft/s with or without AFO for improved gait efficiency   Baseline: 3.2 ft/s no AFO Goal status: REVISED    PLAN:  PT FREQUENCY:  2/week for 4 weeks and then 1x/week for 4 weeks  PT DURATION: 8 weeks  PLANNED INTERVENTIONS: Therapeutic exercises, Therapeutic activity, Neuromuscular re-education, Balance training, Gait training, Patient/Family education, Self Care, Joint mobilization, Joint manipulation, Stair training, Vestibular training, Canalith repositioning, Orthotic/Fit training, DME instructions, Dry Needling, Manual therapy, and Re-evaluation  PLAN FOR NEXT SESSION: [employer]? Add to HEP for quad strength, cervical ROM and overall strength. Lifting, plyometrics, floor transfer, leg presses, BAPS board    Jill Alexanders Laci Frenkel, PT, DPT 04/13/2023, 8:47 AM

## 2023-04-13 NOTE — Telephone Encounter (Signed)
Dr. Everlena Cooper,  I have faxed the right AFO order you placed for Tina Mckee to [employer], but [employer] is requesting a recent office note from you stating medical necessity for the brace.   Do you mind adding an addendum to your most recent visit note (02/17/23) stating medical necessity for a R AFO and I will fax this to [employer]?   Thank you, Josephine Igo, PT, DPT [facility] [address] [place], [place]  [address] Phone:  [phone] Fax:  [phone]

## 2023-04-15 ENCOUNTER — Ambulatory Visit: Payer: BC Managed Care – PPO | Attending: Neurology | Admitting: Physical Therapy

## 2023-04-15 VITALS — BP 121/97 | HR 72

## 2023-04-15 DIAGNOSIS — R2681 Unsteadiness on feet: Secondary | ICD-10-CM | POA: Diagnosis not present

## 2023-04-15 DIAGNOSIS — M5412 Radiculopathy, cervical region: Secondary | ICD-10-CM | POA: Insufficient documentation

## 2023-04-15 DIAGNOSIS — R2689 Other abnormalities of gait and mobility: Secondary | ICD-10-CM | POA: Diagnosis not present

## 2023-04-15 DIAGNOSIS — M6281 Muscle weakness (generalized): Secondary | ICD-10-CM | POA: Insufficient documentation

## 2023-04-15 NOTE — Therapy (Signed)
OUTPATIENT PHYSICAL THERAPY CERVICAL/LOWER EXTREMITY TREATMENT   Patient Name: Tina Mckee MRN: [id number] DOB:Aug 11, 1991, 32 y.o., female Today's Date: 04/15/2023  END OF SESSION:  PT End of Session - 04/15/23 0804     Visit Number 8    Number of Visits 13   Plus eval   Date for PT Re-Evaluation 05/25/23    Authorization Type BCBS (30 VL combined w/OT)    PT Start Time 0802    PT Stop Time 0842    PT Time Calculation (min) 40 min    Equipment Utilized During Treatment Gait belt    Activity Tolerance Patient tolerated treatment well    Behavior During Therapy WFL for tasks assessed/performed                    Past Medical History:  Diagnosis Date   Asthma    Gestational hypertension    Perinatal arterial ischemic stroke (HCC)    Scoliosis    Sleep apnea    Stroke (HCC) birth   rt hemiparesis  Perinatal   Supervision of other normal pregnancy 05/04/2014   Past Surgical History:  Procedure Laterality Date   FOOT SURGERY     rt foot   FOOT SURGERY     Bone placed   moles removed     from back   Patient Active Problem List   Diagnosis Date Noted   Diastolic hypertension 03/17/2023   Morbid obesity (HCC) 03/17/2023   Hydradenitis 10/14/2022   OSA (obstructive sleep apnea) 06/03/2020   Insomnia 06/03/2020   Hemiparesis affecting right side as late effect of cerebrovascular accident (HCC) 02/06/2016   Pigmented skin lesion of uncertain nature 02/20/2015   Hyperlipidemia 02/20/2015   Visit for preventive health examination 02/20/2015   Hx of fall 02/20/2015   Encounter for IUD insertion 02/12/2015   Cystic 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, antepartum 11/14/2014   Rubella non-immune status, antepartum 07/31/2014   History of stroke 05/04/2014   Obesity (BMI 30-39.9) 09/29/2013   Thrombocytosis 10/01/2012   High blood triglycerides 09/27/2012   Abnormal LFTs 09/27/2012   Weight gain 09/27/2012   Shaking 04/22/2012   Hyperglycemia 09/23/2011   Abnormal glucose  09/21/2011   Falling episodes 09/21/2011   Cervical radicular pain 08/03/2011   Wrist pain 02/16/2011   Hemiparesis, right (HCC) 02/16/2011   SCOLIOSIS 01/16/2009    PCP: Madelin Headings, MD  REFERRING PROVIDER: Drema Dallas, DO  REFERRING DIAG: [phone] (ICD-10-CM) - Left cervical radiculopathy; R26.89 (ICD-10-CM) - Other abnormalities of gait and mobility   THERAPY DIAG:  Other abnormalities of gait and mobility  Unsteadiness on feet  Muscle weakness (generalized)  Rationale for Evaluation and Treatment: Rehabilitation  ONSET DATE: 02/19/2023 (referral)  SUBJECTIVE:  SUBJECTIVE STATEMENT: Pt reports she has not heard from [employer] this date. Having some back pain today. 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  For several years, she will fall.  It will occur every 3 months or so.  She says her right leg will just give out.  She does report some right knee pain.  Since around 2013, she has had arrhythmic movements of her right upper extremity and right foot.  She was evaluated by neurology for these movements at that time.  At that time, she also exhibited jerking in the left hand.  MRI of brain on 06/02/2012 personally reviewed revealed old left MCA stroke but no new findings.  She had a routine EEG that revealed intermittent arrhythmic theta and delta slowing in the left central parietal region that appeared to correlate with right arm and leg jerking.  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  She was started on Keppra but discontinued due to dizziness and was instead started on clonazepam.  She was lost to follow up.     She continues to have involuntary right sided movements.  It occurs daily throughout the day, at rest or while in bed.  She says the falls are not associated with involuntary movement of the right lower extremity.    PAIN:  Are you having pain? Yes: NPRS scale: 6/10 Pain location: Low back Pain description: Achy Aggravating factors: Weightbearing  Relieving factors: None  PRECAUTIONS: Fall  WEIGHT BEARING RESTRICTIONS: No  FALLS:  Has patient fallen in last 6 months? Yes. Number of falls 2  LIVING ENVIRONMENT: Lives with: lives with their family and lives with their spouse Lives in: House/apartment Stairs: Yes: External: 3-5 in front and back steps; can reach both and only uses R side Has following equipment at home:  knee brace  OCCUPATION: Unemployed   PLOF: Independent  PATIENT GOALS: "Getting better control of my body"   OBJECTIVE:   DIAGNOSTIC FINDINGS:  MRI of brain on 02/20/2016  IMPRESSION: 1. Unchanged appearance of the brain without acute abnormality or mass. 2. Chronic left MCA infarct.  VITALS  Vitals:   04/15/23 0809  BP: (!) 121/97  Pulse: 72      PATIENT SURVEYS: Taken on Eval  NDI 22/50 (moderate disability) Myelopathy Disability Index: 11/30   COGNITION: Overall cognitive status: Within functional limits for tasks assessed  SENSATION: Frequent numbness of LUE   POSTURE: rounded shoulders, forward head, and excessive supination of R foot in seated position    CERVICAL ROM: Tested in seated position   Active ROM A/PROM (deg) eval  Flexion 38  Extension 51  Right lateral flexion 51  Left lateral flexion 31  Right rotation 70  Left rotation 65   (Blank rows = not tested)   TODAY'S TREATMENT:            Ther Act Assessed BP (see above) and WNL for therapy   Ther Ex  Session performed w/R [place] WalkOn AFO donned  Seated blue theraball roll outs for improved spinal mobility and pain modulation, x10 reps  Supine iron crosses,  x5 per side, for improved lumbar mobility and posterior chain stretch. Added to HEP (see bolded below). Seated piriformis stretch, x2 min per side, for improved pain modulation. Added to HEP (see bolded below).  Pt reported 3/10 low back pain following stretches  In // bars for improved posterior chain strength, BLE strength and endurance:  Staggered stance RDLs using 12# KB, x12 per side. Increased difficulty performing w/RLE forward vs LLE.  Alt step up w/contralateral march on blue side of Bosu, x12 per side w/intermittent UE support. Pt performed well w/improved stability on R side w/use of AFO.                                                                                            PATIENT EDUCATION:  Education details: Updates to HEP, info to call therapist if Janna Arch has not contacted pt by tomorrow (5/3)  Person educated: Patient Education method: Medical illustrator Education comprehension: verbalized understanding and returned demonstration  HOME EXERCISE PROGRAM: Access Code: [email or web address] URL: [email or web address] Date: 03/25/2023 Prepared by: Alethia Berthold Tahesha Skeet  Exercises - Cervical Retraction at Wall  - 1 x daily - 7 x weekly - 3 sets - 10 reps - Forward Step Down Touch with Toe  - 1 x daily - 7 x weekly - 3 sets - 10 reps - Forward Step Up  - 1 x daily - 7 x weekly - 3 sets - 10 reps - Standing Bent Over Single Arm Shoulder Row  - 1 x daily - 7 x weekly - 3 sets - 10 reps - Single Arm Doorway Pec Stretch at 90 Degrees Abduction  - 1 x daily - 7 x weekly - 3 sets - 10 reps - Median Nerve Flossing - Tray  - 1 x daily - 7 x weekly - 3 sets - 10 reps - Wrist Flexor Stretch on Table  - 1 x daily - 7 x weekly - 3-4 reps - 30-45 second  hold - Standing Radial Nerve Glide  - 1 x daily - 7 x weekly - 3 sets - 10 reps - Ulnar Nerve Flossing  - 1 x daily - 7 x weekly - 3 sets - 10 reps - Seated Upper Trapezius Stretch  - 1 x daily - 7 x weekly - 3-4 reps - 45-60  second hold - Supine Straight Leg Lumbar Rotation Stretch  - 1 x daily - 7 x weekly - 5-8 reps - 30-60 second hold - Seated Piriformis Stretch with Trunk Bend  - 1 x daily - 7 x weekly - 3-4 reps - 30-60 second hold  ASSESSMENT:  CLINICAL IMPRESSION: Emphasis of skilled PT session on spinal mobility, single leg stability and posterior chain strength. Pt initially reporting high levels of low back pain, but did significantly improve following gently mobility. Pt reports feeling much more stable w/use of AFO and was able to perform dynamic lifts without instability. Pt continues to be limited by BLE weakness, leg length discrepancy and R hemibody weakness. Continue POC.    OBJECTIVE IMPAIRMENTS: Abnormal gait, decreased activity tolerance, decreased balance, decreased coordination, decreased endurance, decreased mobility, difficulty walking, decreased strength, impaired sensation, impaired UE functional use, improper body mechanics, and pain   ACTIVITY LIMITATIONS: carrying, lifting, bending, standing, squatting, sleeping, stairs, transfers, bathing, locomotion level, and caring for others  PARTICIPATION LIMITATIONS: meal prep, cleaning, laundry, driving, shopping, community activity, occupation, and yard work  PERSONAL FACTORS: Age, Fitness, Past/current experiences, and 1 comorbidity: perinatal CVA  are also affecting patient's functional outcome.   REHAB POTENTIAL: Good  CLINICAL DECISION MAKING: Evolving/moderate complexity  EVALUATION COMPLEXITY: Moderate   GOALS: Goals reviewed with patient? Yes  SHORT TERM GOALS: Target date: 04/20/2023  Pt will be independent with initial HEP for improved strength, balance, transfers and gait.  Baseline: Not established on eval  Goal status: INITIAL  2.  Pt will trial various braces on RLE to determine safest option for mobility and maximize independence.  Baseline:  Goal status: INITIAL  3.  MiniBest to be assessed and LTG written   Baseline: 22/28 Goal status: REVISED  4.  Pt will improve cervical left lateral flexion by >/= 10 degrees for improved functional ROM  Baseline: 31 degrees  Goal status: INITIAL  5. Pt will improve gait velocity to at least 3.5 ft/s with or without AFO for improved gait efficiency and independence   Baseline: 3.2 ft/s no AFO Goal status: REVISED   LONG TERM GOALS: Target date: 05/18/2023   Pt will be independent with final HEP for improved strength, balance, transfers and gait.  Baseline:  Goal status: INITIAL  2.  Pt will improve MiniBest to 25/28 for decreased fall risk and improvement with compensatory stepping strategies.   Baseline: 22/28 Goal status: REVISED  3.  Pt will improve NDI to >/= 15/50 for decreased pain levels and improved independence w/ADLs Baseline: 22/50 Goal status: INITIAL  4.  Pt will improve gait velocity to at least 3.8 ft/s with or without AFO for improved gait efficiency   Baseline: 3.2 ft/s no AFO Goal status: REVISED    PLAN:  PT FREQUENCY:  2/week for 4 weeks and then 1x/week for 4 weeks  PT DURATION: 8 weeks  PLANNED INTERVENTIONS: Therapeutic exercises, Therapeutic activity, Neuromuscular re-education, Balance training, Gait training, Patient/Family education, Self Care, Joint mobilization, Joint manipulation, Stair training, Vestibular training, Canalith repositioning, Orthotic/Fit training, DME instructions, Dry Needling, Manual therapy, and Re-evaluation  PLAN FOR NEXT SESSION: Goals. [employer]? Add to HEP for quad strength, cervical ROM and overall strength. Lifting, plyometrics, floor transfer, leg presses, BAPS board    Jill Alexanders Syncere Kaminski, PT, DPT 04/15/2023, 8:43 AM

## 2023-04-22 ENCOUNTER — Ambulatory Visit: Payer: BC Managed Care – PPO | Admitting: Physical Therapy

## 2023-04-22 VITALS — BP 123/92 | HR 72

## 2023-04-22 DIAGNOSIS — R2689 Other abnormalities of gait and mobility: Secondary | ICD-10-CM | POA: Diagnosis not present

## 2023-04-22 DIAGNOSIS — M6281 Muscle weakness (generalized): Secondary | ICD-10-CM

## 2023-04-22 DIAGNOSIS — R2681 Unsteadiness on feet: Secondary | ICD-10-CM | POA: Diagnosis not present

## 2023-04-22 DIAGNOSIS — M5412 Radiculopathy, cervical region: Secondary | ICD-10-CM | POA: Diagnosis not present

## 2023-04-22 NOTE — Therapy (Signed)
OUTPATIENT PHYSICAL THERAPY CERVICAL/LOWER EXTREMITY TREATMENT   Patient Name: Chapin Hally MRN: [id number] DOB:1991-11-23, 32 y.o., female Today's Date: 04/22/2023  END OF SESSION:  PT End of Session - 04/22/23 0741     Visit Number 9    Number of Visits 13   Plus eval   Date for PT Re-Evaluation 05/25/23    Authorization Type BCBS (30 VL combined w/OT)    PT Start Time 0740    PT Stop Time 0822    PT Time Calculation (min) 42 min    Equipment Utilized During Treatment Gait belt    Activity Tolerance Patient tolerated treatment well    Behavior During Therapy WFL for tasks assessed/performed                     Past Medical History:  Diagnosis Date   Asthma    Gestational hypertension    Perinatal arterial ischemic stroke (HCC)    Scoliosis    Sleep apnea    Stroke (HCC) birth   rt hemiparesis  Perinatal   Supervision of other normal pregnancy 05/04/2014   Past Surgical History:  Procedure Laterality Date   FOOT SURGERY     rt foot   FOOT SURGERY     Bone placed   moles removed     from back   Patient Active Problem List   Diagnosis Date Noted   Diastolic hypertension 03/17/2023   Morbid obesity (HCC) 03/17/2023   Hydradenitis 10/14/2022   OSA (obstructive sleep apnea) 06/03/2020   Insomnia 06/03/2020   Hemiparesis affecting right side as late effect of cerebrovascular accident (HCC) 02/06/2016   Pigmented skin lesion of uncertain nature 02/20/2015   Hyperlipidemia 02/20/2015   Visit for preventive health examination 02/20/2015   Hx of fall 02/20/2015   Encounter for IUD insertion 02/12/2015   Cystic 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, antepartum 11/14/2014   Rubella non-immune status, antepartum 07/31/2014   History of stroke 05/04/2014   Obesity (BMI 30-39.9) 09/29/2013   Thrombocytosis 10/01/2012   High blood triglycerides 09/27/2012   Abnormal LFTs 09/27/2012   Weight gain 09/27/2012   Shaking 04/22/2012   Hyperglycemia 09/23/2011   Abnormal glucose  09/21/2011   Falling episodes 09/21/2011   Cervical radicular pain 08/03/2011   Wrist pain 02/16/2011   Hemiparesis, right (HCC) 02/16/2011   SCOLIOSIS 01/16/2009    PCP: Madelin Headings, MD  REFERRING PROVIDER: Drema Dallas, DO  REFERRING DIAG: [phone] (ICD-10-CM) - Left cervical radiculopathy; R26.89 (ICD-10-CM) - Other abnormalities of gait and mobility   THERAPY DIAG:  Other abnormalities of gait and mobility  Unsteadiness on feet  Muscle weakness (generalized)  Rationale for Evaluation and Treatment: Rehabilitation  ONSET DATE: 02/19/2023 (referral)  SUBJECTIVE:  SUBJECTIVE STATEMENT: Pt has appointment with [facility] on 5/22. Denies acute changes. No falls. Had root canal on Tuesday.    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  For several years, she will fall.  It will occur every 3 months or so.  She says her right leg will just give out.  She does report some right knee pain.  Since around 2013, she has had arrhythmic movements of her right upper extremity and right foot.  She was evaluated by neurology for these movements at that time.  At that time, she also exhibited jerking in the left hand.  MRI of brain on 06/02/2012 personally reviewed revealed old left MCA stroke but no new findings.  She had a routine EEG that revealed intermittent arrhythmic theta and delta slowing in the left central parietal region that appeared to correlate with right arm and leg jerking.  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  She was started on Keppra but discontinued due to dizziness and was instead started on clonazepam.  She was lost to  follow up.    She continues to have involuntary right sided movements.  It occurs daily throughout the day, at rest or while in bed.  She says the falls are not associated with involuntary movement of the right lower extremity.    PAIN:  Are you having pain? Yes: NPRS scale: 5/10 Pain location: Mouth (from root canal) Pain description: Achy Aggravating factors: Weightbearing  Relieving factors: None  PRECAUTIONS: Fall  WEIGHT BEARING RESTRICTIONS: No  FALLS:  Has patient fallen in last 6 months? Yes. Number of falls 2  LIVING ENVIRONMENT: Lives with: lives with their family and lives with their spouse Lives in: House/apartment Stairs: Yes: External: 3-5 in front and back steps; can reach both and only uses R side Has following equipment at home:  knee brace  OCCUPATION: Unemployed   PLOF: Independent  PATIENT GOALS: "Getting better control of my body"   OBJECTIVE:   DIAGNOSTIC FINDINGS:  MRI of brain on 02/20/2016  IMPRESSION: 1. Unchanged appearance of the brain without acute abnormality or mass. 2. Chronic left MCA infarct.  VITALS  Vitals:   04/22/23 0745  BP: (!) 123/92  Pulse: 72      PATIENT SURVEYS: Taken on Eval  NDI 22/50 (moderate disability) Myelopathy Disability Index: 11/30   COGNITION: Overall cognitive status: Within functional limits for tasks assessed  SENSATION: Frequent numbness of LUE   POSTURE: rounded shoulders, forward head, and excessive supination of R foot in seated position    CERVICAL ROM: Tested in seated position   Active ROM A/PROM (deg) eval AROM (Deg) 5/9  Flexion 38   Extension 51   Right lateral flexion 51   Left lateral flexion 31 39  Right rotation 70   Left rotation 65    (Blank rows = not tested)   TODAY'S TREATMENT:            Ther Act Assessed BP (see above) and WNL for therapy  STG Assessment   [facility] PT Assessment - 04/22/23 0749       Ambulation/Gait   Gait velocity 32.8' over 9.47s = 3.46  ft/s no AFO   no AD          Assessed left cervical flexion ( see chart above)  Ther Ex  SciFit multi-peaks level 6.5 for 10 minutes using BUE/BLEs  for global strengthening and dynamic cardiovascular warmup. RPE of 5/10 following activity  In // bars for improved single leg stability, LE coordination and ankle strength:  Standing on airex, single leg stance while tapping 3 6" cones (two on ground, one elevated on 2" step) w/contralateral leg without UE support, x10 reps per side. Increased instability when standing on RLE but pt able to perform well without knocking over cones or using UE support. Pt more unstable when rushing through movement, so min cues to slow down.  On rockerboard in L/R direction mini squats x15 using mirror for visual biofeedback on body position. Pt has L lateral lean preference but did well facilitating weight shift to R side without UE support. Pt able to maintain proper knee and back positioning without cues.                                                                                             PATIENT EDUCATION:  Education details: Goal outcomes, continue HEP Person educated: Patient Education method: Medical illustrator Education comprehension: verbalized understanding and returned demonstration  HOME EXERCISE PROGRAM: Access Code: [email or web address] URL: [email or web address] Date: 03/25/2023 Prepared by: Alethia Berthold Bowen Goyal  Exercises - Cervical Retraction at Wall  - 1 x daily - 7 x weekly - 3 sets - 10 reps - Forward Step Down Touch with Toe  - 1 x daily - 7 x weekly - 3 sets - 10 reps - Forward Step Up  - 1 x daily - 7 x weekly - 3 sets - 10 reps - Standing Bent Over Single Arm Shoulder Row  - 1 x daily - 7 x weekly - 3 sets - 10 reps - Single Arm Doorway Pec Stretch at 90 Degrees Abduction  - 1 x daily - 7 x weekly - 3 sets - 10 reps - Median Nerve Flossing - Tray  - 1 x daily - 7 x weekly - 3 sets - 10 reps - Wrist Flexor Stretch on  Table  - 1 x daily - 7 x weekly - 3-4 reps - 30-45 second  hold - Standing Radial Nerve Glide  - 1 x daily - 7 x weekly - 3 sets - 10 reps - Ulnar Nerve Flossing  - 1 x daily - 7 x weekly - 3 sets - 10 reps - Seated Upper Trapezius Stretch  - 1 x daily - 7 x weekly - 3-4 reps - 45-60 second hold - Supine Straight Leg Lumbar Rotation Stretch  - 1 x daily - 7 x weekly - 5-8 reps - 30-60 second hold - Seated Piriformis Stretch with Trunk Bend  - 1 x daily - 7 x weekly - 3-4 reps - 30-60 second hold  ASSESSMENT:  CLINICAL IMPRESSION: Emphasis of skilled PT session on STG Assessment, endurance and single leg stability. Session limited as pt had root canal this week and still having oral pain/swelling. Pt has met 4 of 5 STGs, improving her gait speed without use of AFO and trialing various braces in clinic to assist w/R ankle positioning. Pt has appointment w/[facility] on 5/22 to obtain medial strut AFO. Pt has improved her  L cervical lateral flexion by 8 degrees, narrowly missing her goal of 10 degrees. Pt continues to be limited by R ankle instability and weakness 2/2 perinatal stroke but has improved her strength and confidence since eval. Continue POC.   OBJECTIVE IMPAIRMENTS: Abnormal gait, decreased activity tolerance, decreased balance, decreased coordination, decreased endurance, decreased mobility, difficulty walking, decreased strength, impaired sensation, impaired UE functional use, improper body mechanics, and pain   ACTIVITY LIMITATIONS: carrying, lifting, bending, standing, squatting, sleeping, stairs, transfers, bathing, locomotion level, and caring for others  PARTICIPATION LIMITATIONS: meal prep, cleaning, laundry, driving, shopping, community activity, occupation, and yard work  PERSONAL FACTORS: Age, Fitness, Past/current experiences, and 1 comorbidity: perinatal CVA  are also affecting patient's functional outcome.   REHAB POTENTIAL: Good  CLINICAL DECISION MAKING: Evolving/moderate  complexity  EVALUATION COMPLEXITY: Moderate   GOALS: Goals reviewed with patient? Yes  SHORT TERM GOALS: Target date: 04/20/2023  Pt will be independent with initial HEP for improved strength, balance, transfers and gait.  Baseline: Not established on eval  Goal status: MET  2.  Pt will trial various braces on RLE to determine safest option for mobility and maximize independence.  Baseline: pt has appointment with [facility] on 5/22 Goal status: MET  3.  MiniBest to be assessed and LTG written  Baseline: 22/28 Goal status: MET  4.  Pt will improve cervical left lateral flexion by >/= 10 degrees for improved functional ROM  Baseline: 31 degrees; 39 degrees  Goal status: IN PROGRESS  5. Pt will improve gait velocity to at least 3.5 ft/s with or without AFO for improved gait efficiency and independence   Baseline: 3.2 ft/s no AFO; 3.46 ft/s no AFO Goal status: MET   LONG TERM GOALS: Target date: 05/18/2023   Pt will be independent with final HEP for improved strength, balance, transfers and gait.  Baseline:  Goal status: INITIAL  2.  Pt will improve MiniBest to 25/28 for decreased fall risk and improvement with compensatory stepping strategies.   Baseline: 22/28 Goal status: REVISED  3.  Pt will improve NDI to >/= 15/50 for decreased pain levels and improved independence w/ADLs Baseline: 22/50 Goal status: INITIAL  4.  Pt will improve gait velocity to at least 3.8 ft/s with or without AFO for improved gait efficiency   Baseline: 3.2 ft/s no AFO Goal status: REVISED    PLAN:  PT FREQUENCY:  2/week for 4 weeks and then 1x/week for 4 weeks  PT DURATION: 8 weeks  PLANNED INTERVENTIONS: Therapeutic exercises, Therapeutic activity, Neuromuscular re-education, Balance training, Gait training, Patient/Family education, Self Care, Joint mobilization, Joint manipulation, Stair training, Vestibular training, Canalith repositioning, Orthotic/Fit training, DME instructions, Dry  Needling, Manual therapy, and Re-evaluation  PLAN FOR NEXT SESSION: Add to HEP for quad strength, cervical ROM and overall strength. Lifting, plyometrics, floor transfer, leg presses, BAPS board    Jill Alexanders Laryn Venning, PT, DPT 04/22/2023, 8:24 AM

## 2023-04-27 ENCOUNTER — Ambulatory Visit: Payer: BC Managed Care – PPO | Admitting: Physical Therapy

## 2023-04-27 VITALS — BP 124/96 | HR 75

## 2023-04-27 DIAGNOSIS — M6281 Muscle weakness (generalized): Secondary | ICD-10-CM

## 2023-04-27 DIAGNOSIS — R2689 Other abnormalities of gait and mobility: Secondary | ICD-10-CM

## 2023-04-27 DIAGNOSIS — R2681 Unsteadiness on feet: Secondary | ICD-10-CM | POA: Diagnosis not present

## 2023-04-27 DIAGNOSIS — M5412 Radiculopathy, cervical region: Secondary | ICD-10-CM

## 2023-04-27 NOTE — Therapy (Signed)
OUTPATIENT PHYSICAL THERAPY CERVICAL/LOWER EXTREMITY TREATMENT- 10TH VISIT PROGRESS NOTE   Patient Name: Tina Mckee MRN: [id number] DOB:[birth year], 32 y.o., female Today's Date: 04/27/2023  Physical Therapy Progress Note   Dates of Reporting Period:03/23/23 - 04/27/23  See Note below for Objective Data and Assessment of Progress/Goals.  Thank you for the referral of this patient. Alethia Berthold Susie Pousson, PT, DPT   END OF SESSION:  PT End of Session - 04/27/23 0802     Visit Number 10    Number of Visits 13   Plus eval   Date for PT Re-Evaluation 05/25/23    Authorization Type BCBS (30 VL combined w/OT)    PT Start Time 0801    PT Stop Time 0846    PT Time Calculation (min) 45 min    Equipment Utilized During Treatment --    Activity Tolerance Patient tolerated treatment well    Behavior During Therapy WFL for tasks assessed/performed                      Past Medical History:  Diagnosis Date   Asthma    Gestational hypertension    Perinatal arterial ischemic stroke (HCC)    Scoliosis    Sleep apnea    Stroke [facility]) birth   rt hemiparesis  Perinatal   Supervision of other normal pregnancy 05/04/2014   Past Surgical History:  Procedure Laterality Date   FOOT SURGERY     rt foot   FOOT SURGERY     Bone placed   moles removed     from back   Patient Active Problem List   Diagnosis Date Noted   Diastolic hypertension 03/17/2023   Morbid obesity (HCC) 03/17/2023   Hydradenitis 10/14/2022   OSA (obstructive sleep apnea) 06/03/2020   Insomnia 06/03/2020   Hemiparesis affecting right side as late effect of cerebrovascular accident (HCC) 02/06/2016   Pigmented skin lesion of uncertain nature 02/20/2015   Hyperlipidemia 02/20/2015   Visit for preventive health examination 02/20/2015   Hx of fall 02/20/2015   Encounter for IUD insertion 02/12/2015   Cystic 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, antepartum 11/14/2014   Rubella non-immune status, antepartum 07/31/2014   History  of stroke 05/04/2014   Obesity (BMI 30-39.9) 09/29/2013   Thrombocytosis 10/01/2012   High blood triglycerides 09/27/2012   Abnormal LFTs 09/27/2012   Weight gain 09/27/2012   Shaking 04/22/2012   Hyperglycemia 09/23/2011   Abnormal glucose 09/21/2011   Falling episodes 09/21/2011   Cervical radicular pain 08/03/2011   Wrist pain 02/16/2011   Hemiparesis, right (HCC) 02/16/2011   SCOLIOSIS 01/16/2009    PCP: Madelin Headings, MD  REFERRING PROVIDER: Drema Dallas, DO  REFERRING DIAG: [phone] (ICD-10-CM) - Left cervical radiculopathy; R26.89 (ICD-10-CM) - Other abnormalities of gait and mobility   THERAPY DIAG:  Radiculopathy, cervical region  Other abnormalities of gait and mobility  Muscle weakness (generalized)  Rationale for Evaluation and Treatment: Rehabilitation  ONSET DATE: 02/19/2023 (referral)  SUBJECTIVE:  SUBJECTIVE STATEMENT: Pt reports doing well, played basketball with her kids yesterday (shooting hoops) and had no issues. Not having pain today. Wants to work on her neck.    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  For several years, she will fall.  It will occur every 3 months or so.  She says her right leg will just give out.  She does report some right knee pain.  Since around 2013, she has had arrhythmic movements of her right upper extremity and right foot.  She was evaluated by neurology for these movements at that time.  At that time, she also exhibited jerking in the left hand.  MRI of brain on 06/02/2012 personally reviewed revealed old left MCA stroke but no new findings.  She had a routine EEG that revealed intermittent arrhythmic theta and delta slowing in  the left central parietal region that appeared to correlate with right arm and leg jerking.  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  She was started on Keppra but discontinued due to dizziness and was instead started on clonazepam.  She was lost to follow up.    She continues to have involuntary right sided movements.  It occurs daily throughout the day, at rest or while in bed.  She says the falls are not associated with involuntary movement of the right lower extremity.    PAIN:  Are you having pain? No  PRECAUTIONS: Fall  WEIGHT BEARING RESTRICTIONS: No  FALLS:  Has patient fallen in last 6 months? Yes. Number of falls 2  LIVING ENVIRONMENT: Lives with: lives with their family and lives with their spouse Lives in: House/apartment Stairs: Yes: External: 3-5 in front and back steps; can reach both and only uses R side Has following equipment at home:  knee brace  OCCUPATION: Unemployed   PLOF: Independent  PATIENT GOALS: "Getting better control of my body"   OBJECTIVE:   DIAGNOSTIC FINDINGS:  MRI of brain on 02/20/2016  IMPRESSION: 1. Unchanged appearance of the brain without acute abnormality or mass. 2. Chronic left MCA infarct.  VITALS  Vitals:   04/27/23 0808  BP: (!) 124/96  Pulse: 75      PATIENT SURVEYS: Taken on Eval  NDI 22/50 (moderate disability) Myelopathy Disability Index: 11/30   COGNITION: Overall cognitive status: Within functional limits for tasks assessed  SENSATION: Frequent numbness of LUE   POSTURE: rounded shoulders, forward head, and excessive supination of R foot in seated position    CERVICAL ROM: Tested in seated position   Active ROM A/PROM (deg) eval AROM (Deg) 5/9  Flexion 38   Extension 51   Right lateral flexion 51   Left lateral flexion 31 39  Right rotation 70   Left rotation 65    (Blank rows = not tested)   TODAY'S TREATMENT:             Ther Ex  With  back against walls for improved periscapular strength, cervical retraction and postural control:  Standing cervical retraction w/5-10s hold, x10 reps Unweighted [place] presses against wall, x10 reps   Standing PVC passthroughs for improved shoulder ROM and anterior chest stretch, x10 reps.  Progressed to PVC around the worlds for continued shoulder ROM and periscapular strength, x7 per side  Prone splenius capitis look backs w/3-5s hold, x12 per side, for improved cervical and thoracic ROM. Decreased ROM on  R side > L side   Cat cows w/emphasis on scapular protraction/retraction, x15 reps.  Banded serratus wall slides using orange foam roller and red theraband around forearms for improved serratus anterior and periscapular strength. Pt very fatigued w/this and had difficulty sustaining shoulder abduction isometric hold bilaterally (R>L).                                                                                             PATIENT EDUCATION:  Education details: Goal outcomes, continue HEP Person educated: Patient Education method: Medical illustrator Education comprehension: verbalized understanding and returned demonstration  HOME EXERCISE PROGRAM: Access Code: [email or web address] URL: [email or web address] Date: 03/25/2023 Prepared by: Alethia Berthold Cathe Bilger  Exercises - Cervical Retraction at Wall  - 1 x daily - 7 x weekly - 3 sets - 10 reps - Forward Step Down Touch with Toe  - 1 x daily - 7 x weekly - 3 sets - 10 reps - Forward Step Up  - 1 x daily - 7 x weekly - 3 sets - 10 reps - Standing Bent Over Single Arm Shoulder Row  - 1 x daily - 7 x weekly - 3 sets - 10 reps - Single Arm Doorway Pec Stretch at 90 Degrees Abduction  - 1 x daily - 7 x weekly - 3 sets - 10 reps - Median Nerve Flossing - Tray  - 1 x daily - 7 x weekly - 3 sets - 10 reps - Wrist Flexor Stretch on Table  - 1 x daily - 7 x weekly - 3-4 reps - 30-45 second  hold - Standing Radial Nerve Glide  - 1 x  daily - 7 x weekly - 3 sets - 10 reps - Ulnar Nerve Flossing  - 1 x daily - 7 x weekly - 3 sets - 10 reps - Seated Upper Trapezius Stretch  - 1 x daily - 7 x weekly - 3-4 reps - 45-60 second hold - Supine Straight Leg Lumbar Rotation Stretch  - 1 x daily - 7 x weekly - 5-8 reps - 30-60 second hold - Seated Piriformis Stretch with Trunk Bend  - 1 x daily - 7 x weekly - 3-4 reps - 30-60 second hold  ASSESSMENT:  CLINICAL IMPRESSION: Emphasis of skilled PT session on improved cervical, thoracic and shoulder ROM for improved pain modulation and functional mobility. Pt continues to have limited ROM on R side and relies heavily on shoulder shrug compensation to complete R shoulder movements rather than periscapular strength. Pt most challenged by serratus anterior exercises due to decreased stability of shoulders. Will continue to work on shoulder stability and periscapular strength. Continue POC.   OBJECTIVE IMPAIRMENTS: Abnormal gait, decreased activity tolerance, decreased balance, decreased coordination, decreased endurance, decreased mobility, difficulty walking, decreased strength, impaired sensation, impaired UE functional use, improper body mechanics, and pain   ACTIVITY LIMITATIONS: carrying, lifting, bending, standing, squatting, sleeping, stairs, transfers, bathing, locomotion level, and caring for others  PARTICIPATION LIMITATIONS: meal prep, cleaning, laundry, driving, shopping, community activity, occupation, and yard work  PERSONAL FACTORS: Age, Fitness, Past/current experiences, and 1 comorbidity: perinatal CVA  are also affecting patient's  functional outcome.   REHAB POTENTIAL: Good  CLINICAL DECISION MAKING: Evolving/moderate complexity  EVALUATION COMPLEXITY: Moderate   GOALS: Goals reviewed with patient? Yes  SHORT TERM GOALS: Target date: 04/20/2023  Pt will be independent with initial HEP for improved strength, balance, transfers and gait.  Baseline: Not established on  eval  Goal status: MET  2.  Pt will trial various braces on RLE to determine safest option for mobility and maximize independence.  Baseline: pt has appointment with [facility] on 5/22 Goal status: MET  3.  MiniBest to be assessed and LTG written  Baseline: 22/28 Goal status: MET  4.  Pt will improve cervical left lateral flexion by >/= 10 degrees for improved functional ROM  Baseline: 31 degrees; 39 degrees  Goal status: IN PROGRESS  5. Pt will improve gait velocity to at least 3.5 ft/s with or without AFO for improved gait efficiency and independence   Baseline: 3.2 ft/s no AFO; 3.46 ft/s no AFO Goal status: MET   LONG TERM GOALS: Target date: 05/18/2023   Pt will be independent with final HEP for improved strength, balance, transfers and gait.  Baseline:  Goal status: INITIAL  2.  Pt will improve MiniBest to 25/28 for decreased fall risk and improvement with compensatory stepping strategies.   Baseline: 22/28 Goal status: REVISED  3.  Pt will improve NDI to >/= 15/50 for decreased pain levels and improved independence w/ADLs Baseline: 22/50 Goal status: INITIAL  4.  Pt will improve gait velocity to at least 3.8 ft/s with or without AFO for improved gait efficiency   Baseline: 3.2 ft/s no AFO Goal status: REVISED    PLAN:  PT FREQUENCY:  2/week for 4 weeks and then 1x/week for 4 weeks  PT DURATION: 8 weeks  PLANNED INTERVENTIONS: Therapeutic exercises, Therapeutic activity, Neuromuscular re-education, Balance training, Gait training, Patient/Family education, Self Care, Joint mobilization, Joint manipulation, Stair training, Vestibular training, Canalith repositioning, Orthotic/Fit training, DME instructions, Dry Needling, Manual therapy, and Re-evaluation  PLAN FOR NEXT SESSION: Add to HEP for quad strength, cervical ROM and overall strength. Lifting, plyometrics, floor transfer, leg presses, BAPS board    Jill Alexanders Symantha Steeber, PT, DPT 04/27/2023, 8:46 AM

## 2023-04-30 DIAGNOSIS — G4733 Obstructive sleep apnea (adult) (pediatric): Secondary | ICD-10-CM | POA: Diagnosis not present

## 2023-05-03 DIAGNOSIS — L814 Other melanin hyperpigmentation: Secondary | ICD-10-CM | POA: Diagnosis not present

## 2023-05-03 DIAGNOSIS — D225 Melanocytic nevi of trunk: Secondary | ICD-10-CM | POA: Diagnosis not present

## 2023-05-03 DIAGNOSIS — L821 Other seborrheic keratosis: Secondary | ICD-10-CM | POA: Diagnosis not present

## 2023-05-03 DIAGNOSIS — L02412 Cutaneous abscess of left axilla: Secondary | ICD-10-CM | POA: Diagnosis not present

## 2023-05-04 ENCOUNTER — Ambulatory Visit: Payer: BC Managed Care – PPO | Admitting: Physical Therapy

## 2023-05-04 VITALS — BP 129/80 | HR 86

## 2023-05-04 DIAGNOSIS — M5412 Radiculopathy, cervical region: Secondary | ICD-10-CM | POA: Diagnosis not present

## 2023-05-04 DIAGNOSIS — R2681 Unsteadiness on feet: Secondary | ICD-10-CM

## 2023-05-04 DIAGNOSIS — R2689 Other abnormalities of gait and mobility: Secondary | ICD-10-CM | POA: Diagnosis not present

## 2023-05-04 DIAGNOSIS — M6281 Muscle weakness (generalized): Secondary | ICD-10-CM | POA: Diagnosis not present

## 2023-05-04 NOTE — Therapy (Signed)
OUTPATIENT PHYSICAL THERAPY CERVICAL/LOWER EXTREMITY TREATMENT   Patient Name: Tina Mckee MRN: [id number] DOB:04/13/1991, 32 y.o., female Today's Date: 05/04/2023    END OF SESSION:  PT End of Session - 05/04/23 0804     Visit Number 11    Number of Visits 13   Plus eval   Date for PT Re-Evaluation 05/25/23    Authorization Type BCBS (30 VL combined w/OT)    PT Start Time 0802    PT Stop Time 0843    PT Time Calculation (min) 41 min    Equipment Utilized During Treatment Gait belt    Activity Tolerance Patient tolerated treatment well    Behavior During Therapy WFL for tasks assessed/performed                       Past Medical History:  Diagnosis Date   Asthma    Gestational hypertension    Perinatal arterial ischemic stroke (HCC)    Scoliosis    Sleep apnea    Stroke (HCC) birth   rt hemiparesis  Perinatal   Supervision of other normal pregnancy 05/04/2014   Past Surgical History:  Procedure Laterality Date   FOOT SURGERY     rt foot   FOOT SURGERY     Bone placed   moles removed     from back   Patient Active Problem List   Diagnosis Date Noted   Diastolic hypertension 03/17/2023   Morbid obesity (HCC) 03/17/2023   Hydradenitis 10/14/2022   OSA (obstructive sleep apnea) 06/03/2020   Insomnia 06/03/2020   Hemiparesis affecting right side as late effect of cerebrovascular accident (HCC) 02/06/2016   Pigmented skin lesion of uncertain nature 02/20/2015   Hyperlipidemia 02/20/2015   Visit for preventive health examination 02/20/2015   Hx of fall 02/20/2015   Encounter for IUD insertion 02/12/2015   Cystic 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, antepartum 11/14/2014   Rubella non-immune status, antepartum 07/31/2014   History of stroke 05/04/2014   Obesity (BMI 30-39.9) 09/29/2013   Thrombocytosis 10/01/2012   High blood triglycerides 09/27/2012   Abnormal LFTs 09/27/2012   Weight gain 09/27/2012   Shaking 04/22/2012   Hyperglycemia 09/23/2011    Abnormal glucose 09/21/2011   Falling episodes 09/21/2011   Cervical radicular pain 08/03/2011   Wrist pain 02/16/2011   Hemiparesis, right (HCC) 02/16/2011   SCOLIOSIS 01/16/2009    PCP: Madelin Headings, MD  REFERRING PROVIDER: Drema Dallas, DO  REFERRING DIAG: [phone] (ICD-10-CM) - Left cervical radiculopathy; R26.89 (ICD-10-CM) - Other abnormalities of gait and mobility   THERAPY DIAG:  Other abnormalities of gait and mobility  Muscle weakness (generalized)  Unsteadiness on feet  Rationale for Evaluation and Treatment: Rehabilitation  ONSET DATE: 02/19/2023 (referral)  SUBJECTIVE:  SUBJECTIVE STATEMENT: Pt reports doing well. Denies pain today. Sees [facility] tomorrow.    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  For several years, she will fall.  It will occur every 3 months or so.  She says her right leg will just give out.  She does report some right knee pain.  Since around 2013, she has had arrhythmic movements of her right upper extremity and right foot.  She was evaluated by neurology for these movements at that time.  At that time, she also exhibited jerking in the left hand.  MRI of brain on 06/02/2012 personally reviewed revealed old left MCA stroke but no new findings.  She had a routine EEG that revealed intermittent arrhythmic theta and delta slowing in the left central parietal region that appeared to correlate with right arm and leg jerking.  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  She was started on Keppra but discontinued due to dizziness and was instead started on clonazepam.  She was lost to follow up.    She  continues to have involuntary right sided movements.  It occurs daily throughout the day, at rest or while in bed.  She says the falls are not associated with involuntary movement of the right lower extremity.    PAIN:  Are you having pain? No  PRECAUTIONS: Fall  WEIGHT BEARING RESTRICTIONS: No  FALLS:  Has patient fallen in last 6 months? Yes. Number of falls 2  LIVING ENVIRONMENT: Lives with: lives with their family and lives with their spouse Lives in: House/apartment Stairs: Yes: External: 3-5 in front and back steps; can reach both and only uses R side Has following equipment at home:  knee brace  OCCUPATION: Unemployed   PLOF: Independent  PATIENT GOALS: "Getting better control of my body"   OBJECTIVE:   DIAGNOSTIC FINDINGS:  MRI of brain on 02/20/2016  IMPRESSION: 1. Unchanged appearance of the brain without acute abnormality or mass. 2. Chronic left MCA infarct.  VITALS  Vitals:   05/04/23 0831  BP: 129/80  Pulse: 86     PATIENT SURVEYS: Taken on Eval  NDI 22/50 (moderate disability) Myelopathy Disability Index: 11/30   COGNITION: Overall cognitive status: Within functional limits for tasks assessed  SENSATION: Frequent numbness of LUE   POSTURE: rounded shoulders, forward head, and excessive supination of R foot in seated position    CERVICAL ROM: Tested in seated position   Active ROM A/PROM (deg) eval AROM (Deg) 5/9  Flexion 38   Extension 51   Right lateral flexion 51   Left lateral flexion 31 39  Right rotation 70   Left rotation 65    (Blank rows = not tested)   TODAY'S TREATMENT:            Ther Ex  Wall ball shoulder circles w/yellow theraball in clockwise and counterclockwise directions, x10 per side, for improved UE coordination, scapular protraction and shoulder stability. Pt dysmetric on RUE and had increased difficulty w/clockwise direction as this increased flexor tone.  Serratus wall slides using orange foam roller for  improved serratus anterior strength, shoulder stability and periscapular strength. Pt had improved performance with this compared to banded variation from last week.  Sumo deadlift high pulls using 12# KB x12 reps for improved hip extension, facilitation of hip hinge and scapular retraction. SBA for safety. W/fatigue, pt began to lose balance  posteriorly  Slam balls using 10# weighted ball for improved high amplitude movement, facilitation of hip extension and improved functional use of RUE. Pt performed task well w/no report of pain.  In // bars, river rock navigation for improved LE coordination single leg stability and anticipatory balance strategies, x4 down and back w/intermittent UE support and CGA-min A for steadying assist. Pt w/increased difficulty if rocks placed further apart or if taller rock is placed on R side. On final rep, added green dynadisc for increased ankle stability challenge and pt unable to perform without BUE support due to weak R posterior tibialis and ankle instability.                                                                                            PATIENT EDUCATION:  Education details: Continue HEP, written note w/AFO type on it to present to [employer].  Person educated: Patient Education method: Explanation, Demonstration, and Handouts Education comprehension: verbalized understanding and returned demonstration  HOME EXERCISE PROGRAM: Access Code: [email or web address] URL: [email or web address] Date: 03/25/2023 Prepared by: Alethia Berthold Hriday Stai  Exercises - Cervical Retraction at Wall  - 1 x daily - 7 x weekly - 3 sets - 10 reps - Forward Step Down Touch with Toe  - 1 x daily - 7 x weekly - 3 sets - 10 reps - Forward Step Up  - 1 x daily - 7 x weekly - 3 sets - 10 reps - Standing Bent Over Single Arm Shoulder Row  - 1 x daily - 7 x weekly - 3 sets - 10 reps - Single Arm Doorway Pec Stretch at 90 Degrees Abduction  - 1 x daily - 7 x weekly - 3 sets -  10 reps - Median Nerve Flossing - Tray  - 1 x daily - 7 x weekly - 3 sets - 10 reps - Wrist Flexor Stretch on Table  - 1 x daily - 7 x weekly - 3-4 reps - 30-45 second  hold - Standing Radial Nerve Glide  - 1 x daily - 7 x weekly - 3 sets - 10 reps - Ulnar Nerve Flossing  - 1 x daily - 7 x weekly - 3 sets - 10 reps - Seated Upper Trapezius Stretch  - 1 x daily - 7 x weekly - 3-4 reps - 45-60 second hold - Supine Straight Leg Lumbar Rotation Stretch  - 1 x daily - 7 x weekly - 5-8 reps - 30-60 second hold - Seated Piriformis Stretch with Trunk Bend  - 1 x daily - 7 x weekly - 3-4 reps - 30-60 second hold  ASSESSMENT:  CLINICAL IMPRESSION: Emphasis of skilled PT session on improved periscapular strength, LE coordination and posterior chain strength. Pt tolerated session well w/no pain. Pt continues to demonstrate serratus anterior weakness of R side > L side but did improve w/serratus wall slides today compared to previous sessions. Pt very challenged by river rocks and required min A to safely perform due to RLE weakness and ankle instability. However, w/use of AFO, pt much more stable and independent. Pt to go to [facility] tomorrow to be fitted  for AFO. Continue POC.   OBJECTIVE IMPAIRMENTS: Abnormal gait, decreased activity tolerance, decreased balance, decreased coordination, decreased endurance, decreased mobility, difficulty walking, decreased strength, impaired sensation, impaired UE functional use, improper body mechanics, and pain   ACTIVITY LIMITATIONS: carrying, lifting, bending, standing, squatting, sleeping, stairs, transfers, bathing, locomotion level, and caring for others  PARTICIPATION LIMITATIONS: meal prep, cleaning, laundry, driving, shopping, community activity, occupation, and yard work  PERSONAL FACTORS: Age, Fitness, Past/current experiences, and 1 comorbidity: perinatal CVA  are also affecting patient's functional outcome.   REHAB POTENTIAL: Good  CLINICAL DECISION  MAKING: Evolving/moderate complexity  EVALUATION COMPLEXITY: Moderate   GOALS: Goals reviewed with patient? Yes  SHORT TERM GOALS: Target date: 04/20/2023  Pt will be independent with initial HEP for improved strength, balance, transfers and gait.  Baseline: Not established on eval  Goal status: MET  2.  Pt will trial various braces on RLE to determine safest option for mobility and maximize independence.  Baseline: pt has appointment with [facility] on 5/22 Goal status: MET  3.  MiniBest to be assessed and LTG written  Baseline: 22/28 Goal status: MET  4.  Pt will improve cervical left lateral flexion by >/= 10 degrees for improved functional ROM  Baseline: 31 degrees; 39 degrees  Goal status: IN PROGRESS  5. Pt will improve gait velocity to at least 3.5 ft/s with or without AFO for improved gait efficiency and independence   Baseline: 3.2 ft/s no AFO; 3.46 ft/s no AFO Goal status: MET   LONG TERM GOALS: Target date: 05/18/2023   Pt will be independent with final HEP for improved strength, balance, transfers and gait.  Baseline:  Goal status: INITIAL  2.  Pt will improve MiniBest to 25/28 for decreased fall risk and improvement with compensatory stepping strategies.   Baseline: 22/28 Goal status: REVISED  3.  Pt will improve NDI to >/= 15/50 for decreased pain levels and improved independence w/ADLs Baseline: 22/50 Goal status: INITIAL  4.  Pt will improve gait velocity to at least 3.8 ft/s with or without AFO for improved gait efficiency   Baseline: 3.2 ft/s no AFO Goal status: REVISED    PLAN:  PT FREQUENCY:  2/week for 4 weeks and then 1x/week for 4 weeks  PT DURATION: 8 weeks  PLANNED INTERVENTIONS: Therapeutic exercises, Therapeutic activity, Neuromuscular re-education, Balance training, Gait training, Patient/Family education, Self Care, Joint mobilization, Joint manipulation, Stair training, Vestibular training, Canalith repositioning, Orthotic/Fit  training, DME instructions, Dry Needling, Manual therapy, and Re-evaluation  PLAN FOR NEXT SESSION: Add to HEP for quad strength, cervical ROM and overall strength. Lifting, plyometrics, floor transfer, leg presses, BAPS board    Jill Alexanders Zanobia Griebel, PT, DPT 05/04/2023, 12:08 PM

## 2023-05-11 ENCOUNTER — Ambulatory Visit: Payer: BC Managed Care – PPO | Admitting: Physical Therapy

## 2023-05-11 DIAGNOSIS — R2681 Unsteadiness on feet: Secondary | ICD-10-CM

## 2023-05-11 NOTE — Therapy (Signed)
OUTPATIENT PHYSICAL THERAPY CERVICAL/LOWER EXTREMITY TREATMENT- ARRIVED NO CHARGE   Patient Name: Tina Mckee MRN: [id number] DOB:[birth year], 32 y.o., female Today's Date: 05/11/2023    END OF SESSION:  PT End of Session - 05/11/23 0809     Visit Number 11   Arrived no charge   Number of Visits 13   Plus eval   Date for PT Re-Evaluation 05/25/23    Authorization Type BCBS (30 VL combined w/OT)    PT Start Time 0801    PT Stop Time 0812   Arrived no charge   PT Time Calculation (min) 11 min    Activity Tolerance Patient limited by pain    Behavior During Therapy [facility] for tasks assessed/performed                        Past Medical History:  Diagnosis Date   Asthma    Gestational hypertension    Perinatal arterial ischemic stroke (HCC)    Scoliosis    Sleep apnea    Stroke [facility]) birth   rt hemiparesis  Perinatal   Supervision of other normal pregnancy 05/04/2014   Past Surgical History:  Procedure Laterality Date   FOOT SURGERY     rt foot   FOOT SURGERY     Bone placed   moles removed     from back   Patient Active Problem List   Diagnosis Date Noted   Diastolic hypertension 03/17/2023   Morbid obesity (HCC) 03/17/2023   Hydradenitis 10/14/2022   OSA (obstructive sleep apnea) 06/03/2020   Insomnia 06/03/2020   Hemiparesis affecting right side as late effect of cerebrovascular accident (HCC) 02/06/2016   Pigmented skin lesion of uncertain nature 02/20/2015   Hyperlipidemia 02/20/2015   Visit for preventive health examination 02/20/2015   Hx of fall 02/20/2015   Encounter for IUD insertion 02/12/2015   Cystic 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, antepartum 11/14/2014   Rubella non-immune status, antepartum 07/31/2014   History of stroke 05/04/2014   Obesity (BMI 30-39.9) 09/29/2013   Thrombocytosis 10/01/2012   High blood triglycerides 09/27/2012   Abnormal LFTs 09/27/2012   Weight gain 09/27/2012   Shaking 04/22/2012   Hyperglycemia 09/23/2011    Abnormal glucose 09/21/2011   Falling episodes 09/21/2011   Cervical radicular pain 08/03/2011   Wrist pain 02/16/2011   Hemiparesis, right (HCC) 02/16/2011   SCOLIOSIS 01/16/2009    PCP: Madelin Headings, MD  REFERRING PROVIDER: Drema Dallas, DO  REFERRING DIAG: [phone] (ICD-10-CM) - Left cervical radiculopathy; R26.89 (ICD-10-CM) - Other abnormalities of gait and mobility   THERAPY DIAG:  Unsteadiness on feet  Rationale for Evaluation and Treatment: Rehabilitation  ONSET DATE: 02/19/2023 (referral)  SUBJECTIVE:  SUBJECTIVE STATEMENT: Pt reports having a lot of pain today in her R side. Started yesterday and has progressively gotten worse. Started in her back and is now wrapping around her side. 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  For several years, she will fall.  It will occur every 3 months or so.  She says her right leg will just give out.  She does report some right knee pain.  Since around 2013, she has had arrhythmic movements of her right upper extremity and right foot.  She was evaluated by neurology for these movements at that time.  At that time, she also exhibited jerking in the left hand.  MRI of brain on 06/02/2012 personally reviewed revealed old left MCA stroke but no new findings.  She had a routine EEG that revealed intermittent arrhythmic theta and delta slowing in the left central parietal region that appeared to correlate with right arm and leg jerking.  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  She was started on Keppra but discontinued due to dizziness and was instead started on  clonazepam.  She was lost to follow up.    She continues to have involuntary right sided movements.  It occurs daily throughout the day, at rest or while in bed.  She says the falls are not associated with involuntary movement of the right lower extremity.    PAIN:  Are you having pain? Yes: NPRS scale: 10/10 Pain location: R low back/side Pain description: achy/throbbing/sharp  PRECAUTIONS: Fall  WEIGHT BEARING RESTRICTIONS: No  FALLS:  Has patient fallen in last 6 months? Yes. Number of falls 2  LIVING ENVIRONMENT: Lives with: lives with their family and lives with their spouse Lives in: House/apartment Stairs: Yes: External: 3-5 in front and back steps; can reach both and only uses R side Has following equipment at home:  knee brace  OCCUPATION: Unemployed   PLOF: Independent  PATIENT GOALS: "Getting better control of my body"   OBJECTIVE:   DIAGNOSTIC FINDINGS:  MRI of brain on 02/20/2016  IMPRESSION: 1. Unchanged appearance of the brain without acute abnormality or mass. 2. Chronic left MCA infarct.  VITALS  There were no vitals filed for this visit.    PATIENT SURVEYS: Taken on Eval  NDI 22/50 (moderate disability) Myelopathy Disability Index: 11/30   COGNITION: Overall cognitive status: Within functional limits for tasks assessed  SENSATION: Frequent numbness of LUE   POSTURE: rounded shoulders, forward head, and excessive supination of R foot in seated position    CERVICAL ROM: Tested in seated position   Active ROM A/PROM (deg) eval AROM (Deg) 5/9  Flexion 38   Extension 51   Right lateral flexion 51   Left lateral flexion 31 39  Right rotation 70   Left rotation 65    (Blank rows = not tested)   TODAY'S TREATMENT:            Arrived no charge  Due to high levels of pain, pt unable to fully participate in PT session this date.  Discussed recent visit to [facility] and pt in agreement to have custom AFO made for RLE. Therapist to call  prosthetist back to let her know of pt's decision.  Informed pt to call PCP or gynecologist if flank pain continues to worsen. Pt verbalized understanding.  PATIENT EDUCATION:  Education details: Continue HEP, written note w/AFO type on it to present to [employer].  Person educated: Patient Education method: Explanation, Demonstration, and Handouts Education comprehension: verbalized understanding and returned demonstration  HOME EXERCISE PROGRAM: Access Code: [email or web address] URL: [email or web address] Date: 03/25/2023 Prepared by: Alethia Berthold Glenden Rossell  Exercises - Cervical Retraction at Wall  - 1 x daily - 7 x weekly - 3 sets - 10 reps - Forward Step Down Touch with Toe  - 1 x daily - 7 x weekly - 3 sets - 10 reps - Forward Step Up  - 1 x daily - 7 x weekly - 3 sets - 10 reps - Standing Bent Over Single Arm Shoulder Row  - 1 x daily - 7 x weekly - 3 sets - 10 reps - Single Arm Doorway Pec Stretch at 90 Degrees Abduction  - 1 x daily - 7 x weekly - 3 sets - 10 reps - Median Nerve Flossing - Tray  - 1 x daily - 7 x weekly - 3 sets - 10 reps - Wrist Flexor Stretch on Table  - 1 x daily - 7 x weekly - 3-4 reps - 30-45 second  hold - Standing Radial Nerve Glide  - 1 x daily - 7 x weekly - 3 sets - 10 reps - Ulnar Nerve Flossing  - 1 x daily - 7 x weekly - 3 sets - 10 reps - Seated Upper Trapezius Stretch  - 1 x daily - 7 x weekly - 3-4 reps - 45-60 second hold - Supine Straight Leg Lumbar Rotation Stretch  - 1 x daily - 7 x weekly - 5-8 reps - 30-60 second hold - Seated Piriformis Stretch with Trunk Bend  - 1 x daily - 7 x weekly - 3-4 reps - 30-60 second hold  ASSESSMENT:  CLINICAL IMPRESSION: Arrived no charge due to pain.    OBJECTIVE IMPAIRMENTS: Abnormal gait, decreased activity tolerance, decreased balance, decreased coordination, decreased endurance, decreased mobility, difficulty  walking, decreased strength, impaired sensation, impaired UE functional use, improper body mechanics, and pain   ACTIVITY LIMITATIONS: carrying, lifting, bending, standing, squatting, sleeping, stairs, transfers, bathing, locomotion level, and caring for others  PARTICIPATION LIMITATIONS: meal prep, cleaning, laundry, driving, shopping, community activity, occupation, and yard work  PERSONAL FACTORS: Age, Fitness, Past/current experiences, and 1 comorbidity: perinatal CVA  are also affecting patient's functional outcome.   REHAB POTENTIAL: Good  CLINICAL DECISION MAKING: Evolving/moderate complexity  EVALUATION COMPLEXITY: Moderate   GOALS: Goals reviewed with patient? Yes  SHORT TERM GOALS: Target date: 04/20/2023  Pt will be independent with initial HEP for improved strength, balance, transfers and gait.  Baseline: Not established on eval  Goal status: MET  2.  Pt will trial various braces on RLE to determine safest option for mobility and maximize independence.  Baseline: pt has appointment with [facility] on 5/22 Goal status: MET  3.  MiniBest to be assessed and LTG written  Baseline: 22/28 Goal status: MET  4.  Pt will improve cervical left lateral flexion by >/= 10 degrees for improved functional ROM  Baseline: 31 degrees; 39 degrees  Goal status: IN PROGRESS  5. Pt will improve gait velocity to at least 3.5 ft/s with or without AFO for improved gait efficiency and independence   Baseline: 3.2 ft/s no AFO; 3.46 ft/s no AFO Goal status: MET   LONG TERM GOALS: Target date: 05/18/2023   Pt will be independent with final HEP for improved strength, balance, transfers and gait.  Baseline:  Goal status: INITIAL  2.  Pt will improve MiniBest to 25/28 for decreased fall risk and improvement with compensatory stepping strategies.   Baseline: 22/28 Goal status: REVISED  3.  Pt will improve NDI to >/= 15/50 for decreased pain levels and improved independence  w/ADLs Baseline: 22/50 Goal status: INITIAL  4.  Pt will improve gait velocity to at least 3.8 ft/s with or without AFO for improved gait efficiency   Baseline: 3.2 ft/s no AFO Goal status: REVISED    PLAN:  PT FREQUENCY:  2/week for 4 weeks and then 1x/week for 4 weeks  PT DURATION: 8 weeks  PLANNED INTERVENTIONS: Therapeutic exercises, Therapeutic activity, Neuromuscular re-education, Balance training, Gait training, Patient/Family education, Self Care, Joint mobilization, Joint manipulation, Stair training, Vestibular training, Canalith repositioning, Orthotic/Fit training, DME instructions, Dry Needling, Manual therapy, and Re-evaluation  PLAN FOR NEXT SESSION: Add to HEP for quad strength, cervical ROM and overall strength. Lifting, plyometrics, floor transfer, leg presses, BAPS board    Jill Alexanders Mekiyah Gladwell, PT, DPT 05/11/2023, 8:20 AM

## 2023-05-17 ENCOUNTER — Ambulatory Visit: Payer: BC Managed Care – PPO | Attending: Neurology | Admitting: Physical Therapy

## 2023-05-17 VITALS — BP 145/85 | HR 98

## 2023-05-17 DIAGNOSIS — R2681 Unsteadiness on feet: Secondary | ICD-10-CM | POA: Insufficient documentation

## 2023-05-17 DIAGNOSIS — R2689 Other abnormalities of gait and mobility: Secondary | ICD-10-CM | POA: Diagnosis not present

## 2023-05-17 DIAGNOSIS — M6281 Muscle weakness (generalized): Secondary | ICD-10-CM | POA: Insufficient documentation

## 2023-05-17 NOTE — Therapy (Signed)
OUTPATIENT PHYSICAL THERAPY CERVICAL/LOWER EXTREMITY TREATMENT   Patient Name: Tina Mckee MRN: [id number] DOB:04/23/1991, 32 y.o., female Today's Date: 05/17/2023    END OF SESSION:  PT End of Session - 05/17/23 0804     Visit Number 12    Number of Visits 13   Plus eval   Date for PT Re-Evaluation 05/25/23    Authorization Type [employer] (30 VL combined w/OT)    PT Start Time 0802    PT Stop Time 0845    PT Time Calculation (min) 43 min    Equipment Utilized During Treatment Gait belt    Activity Tolerance Patient tolerated treatment well    Behavior During Therapy WFL for tasks assessed/performed                        Past Medical History:  Diagnosis Date   Asthma    Gestational hypertension    Perinatal arterial ischemic stroke (HCC)    Scoliosis    Sleep apnea    Stroke (HCC) birth   rt hemiparesis  Perinatal   Supervision of other normal pregnancy 05/04/2014   Past Surgical History:  Procedure Laterality Date   FOOT SURGERY     rt foot   FOOT SURGERY     Bone placed   moles removed     from back   Patient Active Problem List   Diagnosis Date Noted   Diastolic hypertension 03/17/2023   Morbid obesity (HCC) 03/17/2023   Hydradenitis 10/14/2022   OSA (obstructive sleep apnea) 06/03/2020   Insomnia 06/03/2020   Hemiparesis affecting right side as late effect of cerebrovascular accident (HCC) 02/06/2016   Pigmented skin lesion of uncertain nature 02/20/2015   Hyperlipidemia 02/20/2015   Visit for preventive health examination 02/20/2015   Hx of fall 02/20/2015   Encounter for IUD insertion 02/12/2015   Cystic 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, antepartum 11/14/2014   Rubella non-immune status, antepartum 07/31/2014   History of stroke 05/04/2014   Obesity (BMI 30-39.9) 09/29/2013   Thrombocytosis 10/01/2012   High blood triglycerides 09/27/2012   Abnormal LFTs 09/27/2012   Weight gain 09/27/2012   Shaking 04/22/2012   Hyperglycemia 09/23/2011    Abnormal glucose 09/21/2011   Falling episodes 09/21/2011   Cervical radicular pain 08/03/2011   Wrist pain 02/16/2011   Hemiparesis, right (HCC) 02/16/2011   SCOLIOSIS 01/16/2009    PCP: Madelin Headings, MD  REFERRING PROVIDER: Drema Dallas, DO  REFERRING DIAG: [phone] (ICD-10-CM) - Left cervical radiculopathy; R26.89 (ICD-10-CM) - Other abnormalities of gait and mobility   THERAPY DIAG:  Unsteadiness on feet  Other abnormalities of gait and mobility  Muscle weakness (generalized)  Rationale for Evaluation and Treatment: Rehabilitation  ONSET DATE: 02/19/2023 (referral)  SUBJECTIVE:  SUBJECTIVE STATEMENT: Pt reports feeling better today, thinks her side pain is coming from an ulcer. Sees [facility] on June 19th to get her brace. No falls.    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  For several years, she will fall.  It will occur every 3 months or so.  She says her right leg will just give out.  She does report some right knee pain.  Since around 2013, she has had arrhythmic movements of her right upper extremity and right foot.  She was evaluated by neurology for these movements at that time.  At that time, she also exhibited jerking in the left hand.  MRI of brain on 06/02/2012 personally reviewed revealed old left MCA stroke but no new findings.  She had a routine EEG that revealed intermittent arrhythmic theta and delta slowing in the left central parietal region that appeared to correlate with right arm and leg jerking.  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  She was started on Keppra but discontinued due to dizziness and was  instead started on clonazepam.  She was lost to follow up.    She continues to have involuntary right sided movements.  It occurs daily throughout the day, at rest or while in bed.  She says the falls are not associated with involuntary movement of the right lower extremity.    PAIN:  Are you having pain? Yes: NPRS scale: 4/10 Pain location: R low back/side Pain description: achy/throbbing/sharp  PRECAUTIONS: Fall  WEIGHT BEARING RESTRICTIONS: No  FALLS:  Has patient fallen in last 6 months? Yes. Number of falls 2  LIVING ENVIRONMENT: Lives with: lives with their family and lives with their spouse Lives in: House/apartment Stairs: Yes: External: 3-5 in front and back steps; can reach both and only uses R side Has following equipment at home:  knee brace  OCCUPATION: Unemployed   PLOF: Independent  PATIENT GOALS: "Getting better control of my body"   OBJECTIVE:   DIAGNOSTIC FINDINGS:  MRI of brain on 02/20/2016  IMPRESSION: 1. Unchanged appearance of the brain without acute abnormality or mass. 2. Chronic left MCA infarct.  VITALS  Vitals:   05/17/23 0809  BP: (!) 145/85  Pulse: 98      PATIENT SURVEYS: Taken on Eval  NDI 22/50 (moderate disability) Myelopathy Disability Index: 11/30   COGNITION: Overall cognitive status: Within functional limits for tasks assessed  SENSATION: Frequent numbness of LUE   POSTURE: rounded shoulders, forward head, and excessive supination of R foot in seated position    CERVICAL ROM: Tested in seated position   Active ROM A/PROM (deg) eval AROM (Deg) 5/9  Flexion 38   Extension 51   Right lateral flexion 51   Left lateral flexion 31 39  Right rotation 70   Left rotation 65    (Blank rows = not tested)   TODAY'S TREATMENT:            Ther Act Assessed vitals (see above) and WNL for therapy  Discussed POC moving forward and pt in agreement to add a few more appointments to allow time to obtain AFO and trial in  clinic. Appointment calendar updated   NMR  Pt performed floor transfer from mat table to floor mat mod I The following were performed on floor mat for improved posterior chain strength and core stability: Mini squats 2x10 w/UE support  on bench Quadruped firehydrants, x8 per side  Bird dogs, x10 per side. Increased difficulty performing w/LLE due to decreased stability on R. Added to HEP (see bolded below)  Gorilla rows w/10# KB on RUE and 12# KB on LUE, x7 per side, for improved posterior chain strength, spinal mobility and BUE strength. Pt performed well w/no reports of pain.                                                                                            PATIENT EDUCATION:  Education details: updates to HEP, updates to POC  Person educated: Patient Education method: Explanation, Demonstration, and Handouts Education comprehension: verbalized understanding and returned demonstration  HOME EXERCISE PROGRAM: Access Code: [email or web address] URL: [email or web address] Date: 03/25/2023 Prepared by: Alethia Berthold Eira Alpert  Exercises - Cervical Retraction at Wall  - 1 x daily - 7 x weekly - 3 sets - 10 reps - Forward Step Down Touch with Toe  - 1 x daily - 7 x weekly - 3 sets - 10 reps - Forward Step Up  - 1 x daily - 7 x weekly - 3 sets - 10 reps - Standing Bent Over Single Arm Shoulder Row  - 1 x daily - 7 x weekly - 3 sets - 10 reps - Single Arm Doorway Pec Stretch at 90 Degrees Abduction  - 1 x daily - 7 x weekly - 3 sets - 10 reps - Median Nerve Flossing - Tray  - 1 x daily - 7 x weekly - 3 sets - 10 reps - Wrist Flexor Stretch on Table  - 1 x daily - 7 x weekly - 3-4 reps - 30-45 second  hold - Standing Radial Nerve Glide  - 1 x daily - 7 x weekly - 3 sets - 10 reps - Ulnar Nerve Flossing  - 1 x daily - 7 x weekly - 3 sets - 10 reps - Seated Upper Trapezius Stretch  - 1 x daily - 7 x weekly - 3-4 reps - 45-60 second hold - Supine Straight Leg Lumbar Rotation Stretch  - 1 x  daily - 7 x weekly - 5-8 reps - 30-60 second hold - Seated Piriformis Stretch with Trunk Bend  - 1 x daily - 7 x weekly - 3-4 reps - 30-60 second hold - Bird Dog  - 1 x daily - 7 x weekly - 3 sets - 10 reps  ASSESSMENT:  CLINICAL IMPRESSION: Emphasis of skilled PT session on posterior chain strength, floor transfers and core stability. Pt reports she has been having difficulty performing her gardening tasks due to R hip and bilateral knee pain, so imitated task via floor mat and bench, which pt performed well. Pt denied pain throughout session, but was limited by core weakness and posterior chain strength. Pt very challenged by bird dogs, so will continue to progress in clinic and added to HEP. Continue POC.   OBJECTIVE IMPAIRMENTS: Abnormal gait, decreased activity tolerance, decreased balance, decreased coordination, decreased endurance, decreased mobility, difficulty walking, decreased strength, impaired sensation, impaired UE functional use, improper body mechanics, and pain   ACTIVITY LIMITATIONS: carrying, lifting, bending, standing, squatting,  sleeping, stairs, transfers, bathing, locomotion level, and caring for others  PARTICIPATION LIMITATIONS: meal prep, cleaning, laundry, driving, shopping, community activity, occupation, and yard work  PERSONAL FACTORS: Age, Fitness, Past/current experiences, and 1 comorbidity: perinatal CVA  are also affecting patient's functional outcome.   REHAB POTENTIAL: Good  CLINICAL DECISION MAKING: Evolving/moderate complexity  EVALUATION COMPLEXITY: Moderate   GOALS: Goals reviewed with patient? Yes  SHORT TERM GOALS: Target date: 04/20/2023  Pt will be independent with initial HEP for improved strength, balance, transfers and gait.  Baseline: Not established on eval  Goal status: MET  2.  Pt will trial various braces on RLE to determine safest option for mobility and maximize independence.  Baseline: pt has appointment with [facility] on  5/22 Goal status: MET  3.  MiniBest to be assessed and LTG written  Baseline: 22/28 Goal status: MET  4.  Pt will improve cervical left lateral flexion by >/= 10 degrees for improved functional ROM  Baseline: 31 degrees; 39 degrees  Goal status: IN PROGRESS  5. Pt will improve gait velocity to at least 3.5 ft/s with or without AFO for improved gait efficiency and independence   Baseline: 3.2 ft/s no AFO; 3.46 ft/s no AFO Goal status: MET   LONG TERM GOALS: Target date: 05/18/2023   Pt will be independent with final HEP for improved strength, balance, transfers and gait.  Baseline:  Goal status: INITIAL  2.  Pt will improve MiniBest to 25/28 for decreased fall risk and improvement with compensatory stepping strategies.   Baseline: 22/28 Goal status: REVISED  3.  Pt will improve NDI to >/= 15/50 for decreased pain levels and improved independence w/ADLs Baseline: 22/50 Goal status: INITIAL  4.  Pt will improve gait velocity to at least 3.8 ft/s with or without AFO for improved gait efficiency   Baseline: 3.2 ft/s no AFO Goal status: REVISED    PLAN:  PT FREQUENCY:  2/week for 4 weeks and then 1x/week for 4 weeks  PT DURATION: 8 weeks  PLANNED INTERVENTIONS: Therapeutic exercises, Therapeutic activity, Neuromuscular re-education, Balance training, Gait training, Patient/Family education, Self Care, Joint mobilization, Joint manipulation, Stair training, Vestibular training, Canalith repositioning, Orthotic/Fit training, DME instructions, Dry Needling, Manual therapy, and Re-evaluation  PLAN FOR NEXT SESSION: Goals and recert. Add to HEP for quad strength, cervical ROM and overall strength. Lifting, plyometrics, floor transfer, leg presses, BAPS board, posterior chain strength    Pecolia Marando E Jerris Fleer, PT, DPT 05/17/2023, 8:45 AM

## 2023-05-18 ENCOUNTER — Ambulatory Visit (INDEPENDENT_AMBULATORY_CARE_PROVIDER_SITE_OTHER): Payer: BC Managed Care – PPO | Admitting: Internal Medicine

## 2023-05-18 ENCOUNTER — Encounter: Payer: Self-pay | Admitting: Internal Medicine

## 2023-05-18 VITALS — BP 114/94 | HR 84 | Temp 98.1°F | Ht 61.0 in | Wt 243.6 lb

## 2023-05-18 DIAGNOSIS — R42 Dizziness and giddiness: Secondary | ICD-10-CM | POA: Diagnosis not present

## 2023-05-18 DIAGNOSIS — I10 Essential (primary) hypertension: Secondary | ICD-10-CM | POA: Diagnosis not present

## 2023-05-18 DIAGNOSIS — Z79899 Other long term (current) drug therapy: Secondary | ICD-10-CM | POA: Diagnosis not present

## 2023-05-18 DIAGNOSIS — H9311 Tinnitus, right ear: Secondary | ICD-10-CM | POA: Diagnosis not present

## 2023-05-18 DIAGNOSIS — I69351 Hemiplegia and hemiparesis following cerebral infarction affecting right dominant side: Secondary | ICD-10-CM

## 2023-05-18 MED ORDER — AMLODIPINE BESYLATE 5 MG PO TABS: 5.0000 mg | ORAL_TABLET | Freq: Every day | ORAL | 1 refills | Status: DC

## 2023-05-18 NOTE — Patient Instructions (Addendum)
Contact you neurology office to add on OT orders .  Increase  amlodipine to 5 mg  per day Will do a referral to ent  because of the intermittent dizziness and   ear symptoms at same time.  Ou should be contacted .

## 2023-05-18 NOTE — Progress Notes (Signed)
Chief Complaint  Patient presents with   Medical Management of Chronic Issues    Follow up on BP. Reports her diastolic has been good. Went to PT yesterday, BP 145/85.  Pt reports her PT advise her to have OT to strengthen right hand. Getting brace for Right leg on June 19.    Dizziness    Pt reports she feels dizzy occasionally. Noticed it about 2 wks ago.     HPI: Tina Mckee 32 y.o. come in for Fu bp  elevation and control   See messages of reading at home monitor  mostly diastolic  is better  but not yet at goal  On low dose amlodipine  2.5 without problem  Getting pt for le weakness Last  2 times  bp was better  Fitted for brace ( pt)  and hopefull  for better mobility and advised to get a  ot referral  Ocass  ringing  an ear sx feels like fluid.  And may get fuzzy hearing  with the dizziness episodes that may last 30 minutes or so .  Adjusted activity as indicated   Taking zyrtec for coming ragweed season but no real nasal congestion at  present ROS: See pertinent positives and negatives per HPI.  Past Medical History:  Diagnosis Date   Asthma    Gestational hypertension    Perinatal arterial ischemic stroke (HCC)    Scoliosis    Sleep apnea    Stroke [facility]) birth   rt hemiparesis  Perinatal   Supervision of other normal pregnancy 05/04/2014    Family History  Problem Relation Age of Onset   Hypertension Mother    Asthma Mother    Hypertension Father    Sleep apnea Father    Asthma Sister    Cancer Maternal Grandfather        pancreatic   Cancer Paternal Grandmother        cancer all over   Other Paternal Grandfather        brain damage   Other Maternal Grandmother        heart problems    Social History   Socioeconomic History   Marital status: Married    Spouse name: Not on file   Number of children: 2   Years of education: Not on file   Highest education level: 12th grade  Occupational History   Occupation: Customer Service  Tobacco Use    Smoking status: Former    Types: Cigarettes    Quit date: 07/11/2007    Years since quitting: 15.8   Smokeless tobacco: Never  Vaping Use   Vaping Use: Never used  Substance and Sexual Activity   Alcohol use: Yes    Alcohol/week: 1.0 standard drink of alcohol    Types: 1 Standard drinks or equivalent per week    Comment: once a month   Drug use: No   Sexual activity: Yes    Birth control/protection: I.U.D.  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     Currently nonsmoking    HH of 4 no pets     had mild preeclampsia with vaginal childbirth.  2011   cb 12 15 preeclampsi no complicatinos   ocass etoh.    Exercise  Walking     Sleep ok    caffiene limited per day .  Social Determinants of Health   Financial Resource Strain: Low Risk  (10/13/2022)   Overall Financial Resource Strain (CARDIA)    Difficulty of Paying Living Expenses: Not hard at all  Food Insecurity: No Food Insecurity (10/13/2022)   Hunger Vital Sign    Worried About Running Out of Food in the Last Year: Never true    Ran Out of Food in the Last Year: Never true  Transportation Needs: No Transportation Needs (10/13/2022)   PRAPARE - Administrator, Civil Service (Medical): No    Lack of Transportation (Non-Medical): No  Physical Activity: Insufficiently Active (10/13/2022)   Exercise Vital Sign    Days of Exercise per Week: 2 days    Minutes of Exercise per Session: 40 min  Stress: No Stress Concern Present (10/13/2022)   [employer] of Occupational Health - Occupational Stress Questionnaire    Feeling of Stress : Only a little  Social Connections: Moderately Isolated (10/13/2022)   Social Connection and Isolation Panel [NHANES]    Frequency of Communication with Friends and Family: More than three times a week    Frequency of Social Gatherings with Friends and  Family: Twice a week    Attends Religious Services: Never    Database administrator or Organizations: No    Attends Engineer, structural: Not on file    Marital Status: Married    Outpatient Medications Prior to Visit  Medication Sig Dispense Refill   amantadine (SYMMETREL) 100 MG capsule Take 1 capsule (100 mg total) by mouth 2 (two) times daily. 60 capsule 5   citalopram (CELEXA) 20 MG tablet TAKE 1 TABLET(20 MG) BY MOUTH DAILY 90 tablet 2   ibuprofen (ADVIL) 600 MG tablet Take 1 tablet (600 mg total) by mouth every 6 (six) hours as needed. 30 tablet 0   ketoconazole (NIZORAL) 2 % shampoo APPLY TO THE SCALP 3 TIMES WEEKLY APPLY TO DAMP SCALP, LATHER, LET SIT 5 MINUTES AND RINSE bridge therapy from dermatology 120 mL 3   levonorgestrel (MIRENA, 52 MG,) 20 MCG/DAY IUD Provided by [facility]     Multiple Vitamins-Calcium (ONE-A-DAY WOMENS PO) Take 1 tablet by mouth daily.     PROAIR HFA 108 (90 Base) MCG/ACT inhaler INHALE 1-2 PUFFS INTO THE LUNGS EVERY 6 (SIX) HOURS AS NEEDED FOR WHEEZING OR SHORTNESS OF BREATH. (Patient taking differently: Inhale 1-2 puffs into the lungs every 6 (six) hours as needed for wheezing or shortness of breath.) 8.5 Inhaler 0   VITAMIN E PO Take by mouth.     amLODipine (NORVASC) 2.5 MG tablet Take 1 tablet (2.5 mg total) by mouth daily. 90 tablet 0   No facility-administered medications prior to visit.     EXAM:  BP (!) 114/94 (BP Location: Left Arm, Cuff Size: Large)   Pulse 84   Temp 98.1 F (36.7 C) (Oral)   Ht 5\' 1"  (1.549 m)   Wt 243 lb 9.6 oz (110.5 kg)   SpO2 97%   BMI 46.03 kg/m   Body mass index is 46.03 kg/m.  GENERAL: vitals reviewed and listed above, alert, oriented, appears well hydrated and in no acute distress with obvious r hemiparesis and rue atrophy  HEENT: atraumatic, conjunctiva  clear, no obvious abnormalities on inspection of external nose and ears tms are clear  min nasal congestion NECK: no obvious masses on  inspection palpation  R hemipareis and rue atrophy independent gait  PSYCH: pleasant and cooperative, no obvious depression or anxiety  BP  Readings from Last 3 Encounters:  05/18/23 (!) 114/94  05/17/23 (!) 145/85  05/04/23 129/80    ASSESSMENT AND PLAN:  Discussed the following assessment and plan:  Diastolic hypertension  Episodes of dizziness - assoc with hearing changes and ringing?  consider menieres.  referral for ent opinion - Plan: Ambulatory referral to ENT  Tinnitus aurium, right - Plan: Ambulatory referral to ENT  Medication management  Hemiparesis affecting right side as late effect of cerebrovascular accident (HCC) Bp almost at goal will increase to 5 mg per day  can take at night  Seems hopeful to increase mobility and agree with OT but advise contact neuro who ordered the PT   ENT referral to address the episodic dizziness poss vertiginous assoc with right ear sx dec? Hearing  and or ringing.     -Patient advised to return or notify health care team  if  new concerns arise.  Patient Instructions  Contact you neurology office to add on OT orders .  Increase  amlodipine to 5 mg  per day Will do a referral to ent  because of the intermittent dizziness and   ear symptoms at same time.  Ou should be contacted .  Neta Mends. Bradin Mcadory M.D.

## 2023-05-24 ENCOUNTER — Ambulatory Visit: Payer: BC Managed Care – PPO | Admitting: Physical Therapy

## 2023-05-24 VITALS — BP 162/124 | HR 77

## 2023-05-24 DIAGNOSIS — R2689 Other abnormalities of gait and mobility: Secondary | ICD-10-CM

## 2023-05-24 NOTE — Therapy (Addendum)
OUTPATIENT PHYSICAL THERAPY CERVICAL/LOWER EXTREMITY TREATMENT - ARRIVED NO CHARGE   Patient Name: Tina Mckee MRN: [id number] DOB:February 06, 1991, 32 y.o., female Today's Date: 05/24/2023    END OF SESSION:  PT End of Session - 05/24/23 0849     Visit Number 12   Arrived no charge   Number of Visits 13   Plus eval   Date for PT Re-Evaluation 05/25/23   Plus eval   Authorization Type BCBS (30 VL combined w/OT)    PT Start Time 0847    PT Stop Time 0902   Arrived no charge   PT Time Calculation (min) 15 min    Equipment Utilized During Treatment --    Activity Tolerance Treatment limited secondary to medical complications (Comment)   HTN   Behavior During Therapy WFL for tasks assessed/performed                         Past Medical History:  Diagnosis Date   Asthma    Gestational hypertension    Perinatal arterial ischemic stroke (HCC)    Scoliosis    Sleep apnea    Stroke [facility]) birth   rt hemiparesis  Perinatal   Supervision of other normal pregnancy 05/04/2014   Past Surgical History:  Procedure Laterality Date   FOOT SURGERY     rt foot   FOOT SURGERY     Bone placed   moles removed     from back   Patient Active Problem List   Diagnosis Date Noted   Diastolic hypertension 03/17/2023   Morbid obesity (HCC) 03/17/2023   Hydradenitis 10/14/2022   OSA (obstructive sleep apnea) 06/03/2020   Insomnia 06/03/2020   Hemiparesis affecting right side as late effect of cerebrovascular accident (HCC) 02/06/2016   Pigmented skin lesion of uncertain nature 02/20/2015   Hyperlipidemia 02/20/2015   Visit for preventive health examination 02/20/2015   Hx of fall 02/20/2015   Encounter for IUD insertion 02/12/2015   Cystic 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, antepartum 11/14/2014   Rubella non-immune status, antepartum 07/31/2014   History of stroke 05/04/2014   Obesity (BMI 30-39.9) 09/29/2013   Thrombocytosis 10/01/2012   High blood triglycerides 09/27/2012   Abnormal  LFTs 09/27/2012   Weight gain 09/27/2012   Shaking 04/22/2012   Hyperglycemia 09/23/2011   Abnormal glucose 09/21/2011   Falling episodes 09/21/2011   Cervical radicular pain 08/03/2011   Wrist pain 02/16/2011   Hemiparesis, right (HCC) 02/16/2011   SCOLIOSIS 01/16/2009    PCP: Madelin Headings, MD  REFERRING PROVIDER: Drema Dallas, DO  REFERRING DIAG: [id number] (ICD-10-CM) - Left cervical radiculopathy; R26.89 (ICD-10-CM) - Other abnormalities of gait and mobility   THERAPY DIAG:  Other abnormalities of gait and mobility  Rationale for Evaluation and Treatment: Rehabilitation  ONSET DATE: 02/19/2023 (referral)  SUBJECTIVE:  SUBJECTIVE STATEMENT: Pt reports having a headache, has had it for a few days. Saw her PCP and had her BP medication increased. 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  For several years, she will fall.  It will occur every 3 months or so.  She says her right leg will just give out.  She does report some right knee pain.  Since around 2013, she has had arrhythmic movements of her right upper extremity and right foot.  She was evaluated by neurology for these movements at that time.  At that time, she also exhibited jerking in the left hand.  MRI of brain on 06/02/2012 personally reviewed revealed old left MCA stroke but no new findings.  She had a routine EEG that revealed intermittent arrhythmic theta and delta slowing in the left central parietal region that appeared to correlate with right arm and leg jerking.  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  She was started on Keppra but discontinued due to  dizziness and was instead started on clonazepam.  She was lost to follow up.    She continues to have involuntary right sided movements.  It occurs daily throughout the day, at rest or while in bed.  She says the falls are not associated with involuntary movement of the right lower extremity.    PAIN:  Are you having pain? Yes: NPRS scale: 4/10 Pain location: R low back/side Pain description: achy/throbbing/sharp  PRECAUTIONS: Fall  WEIGHT BEARING RESTRICTIONS: No  FALLS:  Has patient fallen in last 6 months? Yes. Number of falls 2  LIVING ENVIRONMENT: Lives with: lives with their family and lives with their spouse Lives in: House/apartment Stairs: Yes: External: 3-5 in front and back steps; can reach both and only uses R side Has following equipment at home:  knee brace  OCCUPATION: Unemployed   PLOF: Independent  PATIENT GOALS: "Getting better control of my body"   OBJECTIVE:   DIAGNOSTIC FINDINGS:  MRI of brain on 02/20/2016  IMPRESSION: 1. Unchanged appearance of the brain without acute abnormality or mass. 2. Chronic left MCA infarct.  VITALS  Vitals:   05/24/23 0853 05/24/23 0858  BP: (!) 137/104 (!) 162/124  Pulse: 77        PATIENT SURVEYS: Taken on Eval  NDI 22/50 (moderate disability) Myelopathy Disability Index: 11/30   COGNITION: Overall cognitive status: Within functional limits for tasks assessed  SENSATION: Frequent numbness of LUE   POSTURE: rounded shoulders, forward head, and excessive supination of R foot in seated position    CERVICAL ROM: Tested in seated position   Active ROM A/PROM (deg) eval AROM (Deg) 5/9  Flexion 38   Extension 51   Right lateral flexion 51   Left lateral flexion 31 39  Right rotation 70   Left rotation 65    (Blank rows = not tested)   TODAY'S TREATMENT:            Arrived no charge Assessed vitals (see above) and pt's diastolic BP too elevated to participate in therapy. Pt reports she has not eaten  today and did not take her morning medication. Encouraged pt to go home, eat and take meds and then closely monitor BP. Pt verbalized understanding.  PATIENT EDUCATION:  Education details: Plan to assess goals next session and DC  Person educated: Patient Education method: Explanation Education comprehension: verbalized understanding  HOME EXERCISE PROGRAM: Access Code: [email or web address] URL: [email or web address] Date: 03/25/2023 Prepared by: Alethia Berthold Cindel Daugherty  Exercises - Cervical Retraction at Wall  - 1 x daily - 7 x weekly - 3 sets - 10 reps - Forward Step Down Touch with Toe  - 1 x daily - 7 x weekly - 3 sets - 10 reps - Forward Step Up  - 1 x daily - 7 x weekly - 3 sets - 10 reps - Standing Bent Over Single Arm Shoulder Row  - 1 x daily - 7 x weekly - 3 sets - 10 reps - Single Arm Doorway Pec Stretch at 90 Degrees Abduction  - 1 x daily - 7 x weekly - 3 sets - 10 reps - Median Nerve Flossing - Tray  - 1 x daily - 7 x weekly - 3 sets - 10 reps - Wrist Flexor Stretch on Table  - 1 x daily - 7 x weekly - 3-4 reps - 30-45 second  hold - Standing Radial Nerve Glide  - 1 x daily - 7 x weekly - 3 sets - 10 reps - Ulnar Nerve Flossing  - 1 x daily - 7 x weekly - 3 sets - 10 reps - Seated Upper Trapezius Stretch  - 1 x daily - 7 x weekly - 3-4 reps - 45-60 second hold - Supine Straight Leg Lumbar Rotation Stretch  - 1 x daily - 7 x weekly - 5-8 reps - 30-60 second hold - Seated Piriformis Stretch with Trunk Bend  - 1 x daily - 7 x weekly - 3-4 reps - 30-60 second hold - Bird Dog  - 1 x daily - 7 x weekly - 3 sets - 10 reps  ASSESSMENT:  CLINICAL IMPRESSION: Arrived no charge due to elevated BP. PT reports she did not take her morning meds prior to PT session, so informed pt to go home and take her meds with food and then recheck BP. If diastolic BP continues to be >110 mmHg, pt to go to  urgent care. Pt verbalized understanding.   OBJECTIVE IMPAIRMENTS: Abnormal gait, decreased activity tolerance, decreased balance, decreased coordination, decreased endurance, decreased mobility, difficulty walking, decreased strength, impaired sensation, impaired UE functional use, improper body mechanics, and pain   ACTIVITY LIMITATIONS: carrying, lifting, bending, standing, squatting, sleeping, stairs, transfers, bathing, locomotion level, and caring for others  PARTICIPATION LIMITATIONS: meal prep, cleaning, laundry, driving, shopping, community activity, occupation, and yard work  PERSONAL FACTORS: Age, Fitness, Past/current experiences, and 1 comorbidity: perinatal CVA  are also affecting patient's functional outcome.   REHAB POTENTIAL: Good  CLINICAL DECISION MAKING: Evolving/moderate complexity  EVALUATION COMPLEXITY: Moderate   GOALS: Goals reviewed with patient? Yes  SHORT TERM GOALS: Target date: 04/20/2023  Pt will be independent with initial HEP for improved strength, balance, transfers and gait.  Baseline: Not established on eval  Goal status: MET  2.  Pt will trial various braces on RLE to determine safest option for mobility and maximize independence.  Baseline: pt has appointment with [facility] on 5/22 Goal status: MET  3.  MiniBest to be assessed and LTG written  Baseline: 22/28 Goal status: MET  4.  Pt will improve cervical left lateral flexion by >/= 10 degrees for improved functional ROM  Baseline: 31 degrees; 39 degrees  Goal status: IN PROGRESS  5. Pt will improve gait velocity  to at least 3.5 ft/s with or without AFO for improved gait efficiency and independence   Baseline: 3.2 ft/s no AFO; 3.46 ft/s no AFO Goal status: MET   LONG TERM GOALS: Target date: 05/18/2023   Pt will be independent with final HEP for improved strength, balance, transfers and gait.  Baseline:  Goal status: INITIAL  2.  Pt will improve MiniBest to 25/28 for decreased fall  risk and improvement with compensatory stepping strategies.   Baseline: 22/28 Goal status: REVISED  3.  Pt will improve NDI to >/= 15/50 for decreased pain levels and improved independence w/ADLs Baseline: 22/50 Goal status: INITIAL  4.  Pt will improve gait velocity to at least 3.8 ft/s with or without AFO for improved gait efficiency   Baseline: 3.2 ft/s no AFO Goal status: REVISED    PLAN:  PT FREQUENCY:  2/week for 4 weeks and then 1x/week for 4 weeks  PT DURATION: 8 weeks  PLANNED INTERVENTIONS: Therapeutic exercises, Therapeutic activity, Neuromuscular re-education, Balance training, Gait training, Patient/Family education, Self Care, Joint mobilization, Joint manipulation, Stair training, Vestibular training, Canalith repositioning, Orthotic/Fit training, DME instructions, Dry Needling, Manual therapy, and Re-evaluation  PLAN FOR NEXT SESSION: Goals and recert and DC. Add to HEP for quad strength, cervical ROM and overall strength. Lifting, plyometrics, floor transfer, leg presses, BAPS board, posterior chain strength    Tina Mckee, PT, DPT 05/24/2023, 9:10 AM

## 2023-06-03 ENCOUNTER — Ambulatory Visit: Payer: BC Managed Care – PPO | Admitting: Physical Therapy

## 2023-06-08 DIAGNOSIS — M21371 Foot drop, right foot: Secondary | ICD-10-CM | POA: Diagnosis not present

## 2023-06-09 ENCOUNTER — Encounter: Payer: Self-pay | Admitting: Neurology

## 2023-06-11 ENCOUNTER — Ambulatory Visit: Payer: BC Managed Care – PPO | Admitting: Physical Therapy

## 2023-06-11 VITALS — BP 123/94 | HR 96

## 2023-06-11 DIAGNOSIS — R2681 Unsteadiness on feet: Secondary | ICD-10-CM

## 2023-06-11 DIAGNOSIS — R2689 Other abnormalities of gait and mobility: Secondary | ICD-10-CM | POA: Diagnosis not present

## 2023-06-11 DIAGNOSIS — M6281 Muscle weakness (generalized): Secondary | ICD-10-CM | POA: Diagnosis not present

## 2023-06-11 NOTE — Therapy (Signed)
OUTPATIENT PHYSICAL THERAPY CERVICAL/LOWER EXTREMITY TREATMENT - RECERTIFICATION AND DISCHARGE SUMMARY   Patient Name: Ardine Dulay MRN: [id number] DOB:January 16, 1991, 32 y.o., female Today's Date: 06/11/2023  PHYSICAL THERAPY DISCHARGE SUMMARY  Visits from Start of Care: 13  Current functional level related to goals / functional outcomes: Mod I w/mobility w/use of R AFO for community distances    Remaining deficits: R hemiplegia, decreased endurance   Education / Equipment: HEP, custom thermoplastic R AFO w/knee guard   Patient agrees to discharge. Patient goals were met. Patient is being discharged due to meeting the stated rehab goals.    END OF SESSION:  PT End of Session - 06/11/23 1444     Visit Number 13    Number of Visits 13   Plus eval   Date for PT Re-Evaluation 06/11/23   recert   Authorization Type BCBS (30 VL combined w/OT)    PT Start Time 1443    PT Stop Time 1522   DC   PT Time Calculation (min) 39 min    Equipment Utilized During Treatment Other (comment)   R AFO   Activity Tolerance Patient tolerated treatment well    Behavior During Therapy WFL for tasks assessed/performed                          Past Medical History:  Diagnosis Date   Asthma    Gestational hypertension    Perinatal arterial ischemic stroke (HCC)    Scoliosis    Sleep apnea    Stroke [facility]) birth   rt hemiparesis  Perinatal   Supervision of other normal pregnancy 05/04/2014   Past Surgical History:  Procedure Laterality Date   FOOT SURGERY     rt foot   FOOT SURGERY     Bone placed   moles removed     from back   Patient Active Problem List   Diagnosis Date Noted   Diastolic hypertension 03/17/2023   Morbid obesity (HCC) 03/17/2023   Hydradenitis 10/14/2022   OSA (obstructive sleep apnea) 06/03/2020   Insomnia 06/03/2020   Hemiparesis affecting right side as late effect of cerebrovascular accident (HCC) 02/06/2016   Pigmented skin lesion of  uncertain nature 02/20/2015   Hyperlipidemia 02/20/2015   Visit for preventive health examination 02/20/2015   Hx of fall 02/20/2015   Encounter for IUD insertion 02/12/2015   Cystic 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, antepartum 11/14/2014   Rubella non-immune status, antepartum 07/31/2014   History of stroke 05/04/2014   Obesity (BMI 30-39.9) 09/29/2013   Thrombocytosis 10/01/2012   High blood triglycerides 09/27/2012   Abnormal LFTs 09/27/2012   Weight gain 09/27/2012   Shaking 04/22/2012   Hyperglycemia 09/23/2011   Abnormal glucose 09/21/2011   Falling episodes 09/21/2011   Cervical radicular pain 08/03/2011   Wrist pain 02/16/2011   Hemiparesis, right (HCC) 02/16/2011   SCOLIOSIS 01/16/2009    PCP: Madelin Headings, MD  REFERRING PROVIDER: Drema Dallas, DO  REFERRING DIAG: [id number] (ICD-10-CM) - Left cervical radiculopathy; R26.89 (ICD-10-CM) - Other abnormalities of gait and mobility   THERAPY DIAG:  Other abnormalities of gait and mobility  Unsteadiness on feet  Muscle weakness (generalized)  Rationale for Evaluation and Treatment: Rehabilitation  ONSET DATE: 02/19/2023 (referral)  SUBJECTIVE:  SUBJECTIVE STATEMENT: Pt presents to clinic w/custom R AFO donned. States she is doing well, has been wearing AFO regularly and is still getting used to it, but overall likes it. Denies falls. 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  For several years, she will fall.  It will occur every 3 months or so.  She says her right leg will just give out.  She does report some right knee pain.  Since around 2013, she has had arrhythmic movements of her right upper extremity and  right foot.  She was evaluated by neurology for these movements at that time.  At that time, she also exhibited jerking in the left hand.  MRI of brain on 06/02/2012 personally reviewed revealed old left MCA stroke but no new findings.  She had a routine EEG that revealed intermittent arrhythmic theta and delta slowing in the left central parietal region that appeared to correlate with right arm and leg jerking.  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  She was started on Keppra but discontinued due to dizziness and was instead started on clonazepam.  She was lost to follow up.    She continues to have involuntary right sided movements.  It occurs daily throughout the day, at rest or while in bed.  She says the falls are not associated with involuntary movement of the right lower extremity.    PAIN:  Are you having pain? No  PRECAUTIONS: Fall  WEIGHT BEARING RESTRICTIONS: No  FALLS:  Has patient fallen in last 6 months? Yes. Number of falls 2  LIVING ENVIRONMENT: Lives with: lives with their family and lives with their spouse Lives in: House/apartment Stairs: Yes: External: 3-5 in front and back steps; can reach both and only uses R side Has following equipment at home:  knee brace  OCCUPATION: Unemployed   PLOF: Independent  PATIENT GOALS: "Getting better control of my body"   OBJECTIVE:   DIAGNOSTIC FINDINGS:  MRI of brain on 02/20/2016  IMPRESSION: 1. Unchanged appearance of the brain without acute abnormality or mass. 2. Chronic left MCA infarct.  VITALS  Vitals:   06/11/23 1450  BP: (!) 123/94  Pulse: 96        PATIENT SURVEYS: Taken on Eval  NDI 22/50 (moderate disability) Myelopathy Disability Index: 11/30   COGNITION: Overall cognitive status: Within functional limits for tasks assessed  SENSATION: Frequent numbness of LUE   POSTURE: rounded shoulders, forward head, and excessive supination of R foot  in seated position    CERVICAL ROM: Tested in seated position   Active ROM A/PROM (deg) eval AROM (Deg) 5/9  Flexion 38   Extension 51   Right lateral flexion 51   Left lateral flexion 31 39  Right rotation 70   Left rotation 65    (Blank rows = not tested)   TODAY'S TREATMENT:            Ther Act LTG Assessment    [facility] PT Assessment - 06/11/23 1501       Observation/Other Assessments   Other Surveys  Neck Disability Index    Neck Disability Index  7/50      Ambulation/Gait   Gait velocity - backwards 32.8' over 8.66s = 3.8 ft/s w/AFO and no AD      Mini-BESTest   Sit To Stand Normal: Comes to  stand without use of hands and stabilizes independently.    Rise to Toes Moderate: Heels up, but not full range (smaller than when holding hands), OR noticeable instability for 3 s.   Limited by R AFO   Stand on one leg (left) Moderate: < 20 s   10.81s   Stand on one leg (right) Moderate: < 20 s   4.5s   Stand on one leg - lowest score 1    Compensatory Stepping Correction - Forward Normal: Recovers independently with a single, large step (second realignement is allowed).    Compensatory Stepping Correction - Backward Normal: Recovers independently with a single, large step    Compensatory Stepping Correction - Left Lateral Normal: Recovers independently with 1 step (crossover or lateral OK)    Compensatory Stepping Correction - Right Lateral Normal: Recovers independently with 1 step (crossover or lateral OK)    Stepping Corredtion Lateral - lowest score 2    Stance - Feet together, eyes open, firm surface  Normal: 30s    Stance - Feet together, eyes closed, foam surface  Normal: 30s    Incline - Eyes Closed Normal: Stands independently 30s and aligns with gravity    Change in Gait Speed Normal: Significantly changes walkling speed without imbalance    Walk with head turns - Horizontal Normal: performs head turns with no change in gait speed and good balance    Walk with pivot  turns Normal: Turns with feet close FAST (< 3 steps) with good balance.    Step over obstacles Normal: Able to step over box with minimal change of gait speed and with good balance.    Timed UP & GO with Dual Task Normal: No noticeable change in sitting, standing or walking while backward counting when compared to TUG without    Mini-BEST total score 26      Timed Up and Go Test   Normal TUG (seconds) 9.47   R AFO   Cognitive TUG (seconds) 9.91   Retro counting by 4s            Discussed goal outcomes and educated pt on how to obtain new PT referral if needed later in year.  Assessed pt's brace and noted it is very large for pt's ankle, so does not control ankle inversion/eversion well. Per orthotist, ankle strap can be added so encouraged pt to have this added if she notices her ankle moves too much in the brace. Due to pt's fluctuating edema on the RLE, brace must be large enough to accommodate swelling.                                                                                             PATIENT EDUCATION:  Education details: See above Person educated: Patient Education method: Explanation Education comprehension: verbalized understanding  HOME EXERCISE PROGRAM: Access Code: [email or web address] URL: [email or web address] Date: 03/25/2023 Prepared by: Alethia Berthold Arlando Leisinger  Exercises - Cervical Retraction at Wall  - 1 x daily - 7 x weekly - 3 sets - 10 reps - Forward Step Down Touch with Toe  - 1 x daily - 7 x weekly -  3 sets - 10 reps - Forward Step Up  - 1 x daily - 7 x weekly - 3 sets - 10 reps - Standing Bent Over Single Arm Shoulder Row  - 1 x daily - 7 x weekly - 3 sets - 10 reps - Single Arm Doorway Pec Stretch at 90 Degrees Abduction  - 1 x daily - 7 x weekly - 3 sets - 10 reps - Median Nerve Flossing - Tray  - 1 x daily - 7 x weekly - 3 sets - 10 reps - Wrist Flexor Stretch on Table  - 1 x daily - 7 x weekly - 3-4 reps - 30-45 second  hold - Standing Radial Nerve  Glide  - 1 x daily - 7 x weekly - 3 sets - 10 reps - Ulnar Nerve Flossing  - 1 x daily - 7 x weekly - 3 sets - 10 reps - Seated Upper Trapezius Stretch  - 1 x daily - 7 x weekly - 3-4 reps - 45-60 second hold - Supine Straight Leg Lumbar Rotation Stretch  - 1 x daily - 7 x weekly - 5-8 reps - 30-60 second hold - Seated Piriformis Stretch with Trunk Bend  - 1 x daily - 7 x weekly - 3-4 reps - 30-60 second hold - Bird Dog  - 1 x daily - 7 x weekly - 3 sets - 10 reps  ASSESSMENT:  CLINICAL IMPRESSION: Emphasis of skilled PT session on LTG assessment and DC from PT. Pt has met all LTGs, improving her gait speed and significantly improving her dynamic, reactive and anticipatory balance w/use of R AFO and consistency w/HEP. Pt improved her score on NDI by 15 points, indicative of mild disability. Pt is overall mod I w/mobility and no longer reports pain in neck or LUE that impacts her sleep. Pt verbalized agreement to DC from PT this date to save therapy sessions for OT and due to her improved mobility and functional independence. Due to delay in pt obtaining custom AFO and needing to reschedule final appointment, recertification to be sent today to cover final therapy session.   OBJECTIVE IMPAIRMENTS: Abnormal gait, decreased activity tolerance, decreased balance, decreased coordination, decreased endurance, decreased mobility, difficulty walking, decreased strength, impaired sensation, impaired UE functional use, improper body mechanics, and pain   ACTIVITY LIMITATIONS: carrying, lifting, bending, standing, squatting, sleeping, stairs, transfers, bathing, locomotion level, and caring for others  PARTICIPATION LIMITATIONS: meal prep, cleaning, laundry, driving, shopping, community activity, occupation, and yard work  PERSONAL FACTORS: Age, Fitness, Past/current experiences, and 1 comorbidity: perinatal CVA  are also affecting patient's functional outcome.   REHAB POTENTIAL: Good  CLINICAL DECISION  MAKING: Evolving/moderate complexity  EVALUATION COMPLEXITY: Moderate   GOALS: Goals reviewed with patient? Yes  SHORT TERM GOALS: Target date: 04/20/2023  Pt will be independent with initial HEP for improved strength, balance, transfers and gait.  Baseline: Not established on eval  Goal status: MET  2.  Pt will trial various braces on RLE to determine safest option for mobility and maximize independence.  Baseline: pt has appointment with [facility] on 5/22 Goal status: MET  3.  MiniBest to be assessed and LTG written  Baseline: 22/28 Goal status: MET  4.  Pt will improve cervical left lateral flexion by >/= 10 degrees for improved functional ROM  Baseline: 31 degrees; 39 degrees  Goal status: IN PROGRESS  5. Pt will improve gait velocity to at least 3.5 ft/s with or without AFO for improved gait efficiency  and independence   Baseline: 3.2 ft/s no AFO; 3.46 ft/s no AFO Goal status: MET   LONG TERM GOALS: Target date: 05/18/2023   Pt will be independent with final HEP for improved strength, balance, transfers and gait.  Baseline:  Goal status: MET  2.  Pt will improve MiniBest to 25/28 for decreased fall risk and improvement with compensatory stepping strategies.   Baseline: 22/28; 26/28  Goal status: MET  3.  Pt will improve NDI to </= 15/50 for decreased pain levels and improved independence w/ADLs Baseline: 22/50; 7/50  Goal status: MET  4.  Pt will improve gait velocity to at least 3.8 ft/s with or without AFO for improved gait efficiency   Baseline: 3.2 ft/s no AFO; 3.8 ft/s w/AFO  Goal status: MET    PLAN:  PT FREQUENCY:  2/week for 4 weeks and then 1x/week for 4 weeks  PT DURATION: 8 weeks  PLANNED INTERVENTIONS: Therapeutic exercises, Therapeutic activity, Neuromuscular re-education, Balance training, Gait training, Patient/Family education, Self Care, Joint mobilization, Joint manipulation, Stair training, Vestibular training, Canalith repositioning,  Orthotic/Fit training, DME instructions, Dry Needling, Manual therapy, and Re-evaluation    Alesandra Smart E Aiesha Leland, PT, DPT 06/11/2023, 3:22 PM

## 2023-06-12 ENCOUNTER — Other Ambulatory Visit: Payer: Self-pay | Admitting: Internal Medicine

## 2023-07-06 DIAGNOSIS — H93291 Other abnormal auditory perceptions, right ear: Secondary | ICD-10-CM | POA: Diagnosis not present

## 2023-07-06 DIAGNOSIS — R42 Dizziness and giddiness: Secondary | ICD-10-CM | POA: Diagnosis not present

## 2023-07-06 DIAGNOSIS — H9311 Tinnitus, right ear: Secondary | ICD-10-CM | POA: Diagnosis not present

## 2023-07-16 DIAGNOSIS — H9311 Tinnitus, right ear: Secondary | ICD-10-CM | POA: Diagnosis not present

## 2023-07-26 DIAGNOSIS — G4733 Obstructive sleep apnea (adult) (pediatric): Secondary | ICD-10-CM | POA: Diagnosis not present

## 2023-07-29 ENCOUNTER — Encounter (INDEPENDENT_AMBULATORY_CARE_PROVIDER_SITE_OTHER): Payer: Self-pay

## 2023-08-14 ENCOUNTER — Other Ambulatory Visit: Payer: Self-pay | Admitting: Neurology

## 2023-08-16 ENCOUNTER — Other Ambulatory Visit: Payer: Self-pay | Admitting: Internal Medicine

## 2023-08-17 NOTE — Progress Notes (Signed)
Chief Complaint  Patient presents with   Medical Management of Chronic Issues    Pt reports citalopram isnt working for anymore. Pt states she feels irritable all the times and her anxiety overwhelm her.     HPI: Tina Mckee 32 y.o. come in for Chronic disease management  Last seen 5 4 tinnitus abd diastolic ht   Bp has been ok  ad been to 237 weight.  Going back up some ( max weight was 260 range )  Irritability and mood anxiety thinks citalopram not that helpful now. 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 Feels that she yells and looses it to often now.  Tends to want things just right  ocd tendencies in past. Good relations with mom . Sleep is  ok.  ROS: See pertinent positives and negatives per HPI.  Past Medical History:  Diagnosis Date   Asthma    Gestational hypertension    Perinatal arterial ischemic stroke (HCC)    Scoliosis    Sleep apnea    Stroke [facility]) birth   rt hemiparesis  Perinatal   Supervision of other normal pregnancy 05/04/2014    Family History  Problem Relation Age of Onset   Hypertension Mother    Asthma Mother    Hypertension Father    Sleep apnea Father    Asthma Sister    Cancer Maternal Grandfather        pancreatic   Cancer Paternal Grandmother        cancer all over   Other Paternal Grandfather        brain damage   Other Maternal Grandmother        heart problems    Social History   Socioeconomic History   Marital status: Married    Spouse name: Not on file   Number of children: 2   Years of education: Not on file   Highest education level: 12th grade  Occupational History   Occupation: Customer Service  Tobacco Use   Smoking status: Former    Current packs/day: 0.00    Types: Cigarettes    Quit date: 07/11/2007    Years since quitting: 16.1   Smokeless tobacco: Never  Vaping Use   Vaping status: Never Used  Substance and Sexual Activity   Alcohol use: Yes    Alcohol/week:  1.0 standard drink of alcohol    Types: 1 Standard drinks or equivalent per week    Comment: once a month   Drug use: No   Sexual activity: Yes    Birth control/protection: I.U.D.  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     Currently nonsmoking    HH of 4 no pets     had mild preeclampsia with vaginal childbirth.  2011   cb 12 15 preeclampsi no complicatinos   ocass etoh.    Exercise  Walking     Sleep ok    caffiene limited per day .             Social Determinants of Health   Financial Resource Strain: Low Risk  (10/13/2022)   Overall Financial Resource Strain (CARDIA)    Difficulty of Paying Living Expenses: Not hard at all  Food Insecurity: Low Risk  (07/06/2023)   [facility]  Hunger Vital Sign    Worried About Running Out of Food in the Last Year: Never true    Ran Out of Food in the Last Year: Never true  Transportation Needs: No Transportation Needs (07/06/2023)   Received from [facility]   Transportation    In the past 12 months, has lack of reliable transportation kept you from medical appointments, meetings, work or from getting things needed for daily living? : No  Physical Activity: Insufficiently Active (10/13/2022)   Exercise Vital Sign    Days of Exercise per Week: 2 days    Minutes of Exercise per Session: 40 min  Stress: No Stress Concern Present (10/13/2022)   [employer] of Occupational Health - Occupational Stress Questionnaire    Feeling of Stress : Only a little  Social Connections: Moderately Isolated (10/13/2022)   Social Connection and Isolation Panel [NHANES]    Frequency of Communication with Friends and Family: More than three times a week    Frequency of Social Gatherings with Friends and Family: Twice a week    Attends Religious Services: Never    Database administrator or Organizations: No    Attends  Engineer, structural: Not on file    Marital Status: Married    Outpatient Medications Prior to Visit  Medication Sig Dispense Refill   amantadine (SYMMETREL) 100 MG capsule Take 1 capsule (100 mg total) by mouth 2 (two) times daily. 60 capsule 5   amLODipine (NORVASC) 5 MG tablet Take 1 tablet (5 mg total) by mouth daily. 90 tablet 1   ibuprofen (ADVIL) 600 MG tablet Take 1 tablet (600 mg total) by mouth every 6 (six) hours as needed. 30 tablet 0   ketoconazole (NIZORAL) 2 % shampoo APPLY TO THE SCALP 3 TIMES WEEKLY APPLY TO DAMP SCALP, LATHER, LET SIT 5 MINUTES AND RINSE bridge therapy from dermatology 120 mL 3   levonorgestrel (MIRENA, 52 MG,) 20 MCG/DAY IUD [facility]ided by [facility]     Multiple Vitamins-Calcium (ONE-A-DAY WOMENS PO) Take 1 tablet by mouth daily.     PROAIR HFA 108 (90 Base) MCG/ACT inhaler INHALE 1-2 PUFFS INTO THE LUNGS EVERY 6 (SIX) HOURS AS NEEDED FOR WHEEZING OR SHORTNESS OF BREATH. (Patient taking differently: Inhale 1-2 puffs into the lungs every 6 (six) hours as needed for wheezing or shortness of breath.) 8.5 Inhaler 0   VITAMIN E PO Take by mouth.     citalopram (CELEXA) 20 MG tablet TAKE 1 TABLET(20 MG) BY MOUTH DAILY 90 tablet 2   No facility-administered medications prior to visit.     EXAM:  BP 120/80 (BP Location: Left Arm, Patient Position: Sitting, Cuff Size: Large)   Pulse 78   Temp (!) 97.5 F (36.4 C) (Oral)   Ht 5\' 1"  (1.549 m)   Wt 246 lb (111.6 kg)   SpO2 99%   BMI 46.48 kg/m   Body mass index is 46.48 kg/m. Wt Readings from Last 3 Encounters:  08/18/23 246 lb (111.6 kg)  05/18/23 243 lb 9.6 oz (110.5 kg)  03/17/23 251 lb 3.2 oz (113.9 kg)    GENERAL: vitals reviewed and listed above, alert, oriented, appears well hydrated and in no acute distress HEENT: atraumatic, conjunctiva  clear, no obvious abnormalities on inspection of external nose and ears NECK: no obvious masses on inspection palpation  MS: moves all extremities  afo rle  r hemiparesis nl speech  PSYCH: pleasant and cooperative,normal cognition  and speech  Lab Results  Component Value  Date   WBC 8.9 12/23/2022   HGB 15.2 (H) 12/23/2022   HCT 44.4 12/23/2022   PLT 454.0 (H) 12/23/2022   GLUCOSE 101 (H) 12/23/2022   CHOL 178 03/09/2023   TRIG 186.0 (H) 03/09/2023   HDL 36.10 (L) 03/09/2023   LDLDIRECT 161.0 12/16/2021   LDLCALC 105 (H) 03/09/2023   ALT 14 12/23/2022   AST 14 12/23/2022   NA 141 12/23/2022   K 5.1 12/23/2022   CL 106 12/23/2022   CREATININE 0.55 12/23/2022   BUN 20 12/23/2022   CO2 26 12/23/2022   TSH 1.57 12/23/2022   HGBA1C 5.4 12/23/2022   MICROALBUR 0.9 09/20/2012   BP Readings from Last 3 Encounters:  08/18/23 120/80  06/11/23 (!) 123/94  05/24/23 (!) 162/124    ASSESSMENT AND PLAN:  Discussed the following assessment and plan:  Diastolic hypertension - improved  Medication management  Hemiparesis affecting right side as late effect of cerebrovascular accident (HCC)  Mixed hyperlipidemia  Irritability - and anxiety depression sx Anxiety w /hx depresson and in past year  irritability with ocd tendencies and  situational teenage child and husband works out of town and not in person  Advise counseling strongly for best techniques family and self relations . Inc trial of citalopram to 40 mg per day  Fu med check in 6-8 weeks or as needed.  Will need fu labs in near future on meds   -Patient advised to return or notify health care team  if  new concerns arise.  Patient Instructions  Counseling as we discussed may  be helpful.  Lets try increasing the citalopram  to 40 mg per day .   And then follow up.   6-8 weeks  med check .  Bp is better   continue weight control.      Neta Mends. Danyale Ridinger M.D.

## 2023-08-18 ENCOUNTER — Encounter: Payer: Self-pay | Admitting: Internal Medicine

## 2023-08-18 ENCOUNTER — Ambulatory Visit: Payer: BC Managed Care – PPO | Admitting: Internal Medicine

## 2023-08-18 VITALS — BP 120/80 | HR 78 | Temp 97.5°F | Ht 61.0 in | Wt 246.0 lb

## 2023-08-18 DIAGNOSIS — I10 Essential (primary) hypertension: Secondary | ICD-10-CM | POA: Diagnosis not present

## 2023-08-18 DIAGNOSIS — E782 Mixed hyperlipidemia: Secondary | ICD-10-CM | POA: Diagnosis not present

## 2023-08-18 DIAGNOSIS — I69351 Hemiplegia and hemiparesis following cerebral infarction affecting right dominant side: Secondary | ICD-10-CM

## 2023-08-18 DIAGNOSIS — Z79899 Other long term (current) drug therapy: Secondary | ICD-10-CM

## 2023-08-18 DIAGNOSIS — R454 Irritability and anger: Secondary | ICD-10-CM

## 2023-08-18 MED ORDER — CITALOPRAM HYDROBROMIDE 20 MG PO TABS: 20.0000 mg | ORAL_TABLET | Freq: Two times a day (BID) | ORAL | 2 refills | Status: DC

## 2023-08-18 NOTE — Patient Instructions (Addendum)
Counseling as we discussed may  be helpful.  Lets try increasing the citalopram  to 40 mg per day .   And then follow up.   6-8 weeks  med check .  Bp is better   continue weight control.

## 2023-08-19 ENCOUNTER — Other Ambulatory Visit: Payer: Self-pay | Admitting: Internal Medicine

## 2023-08-19 NOTE — Progress Notes (Signed)
Virtual Visit via Video Note  Consent was obtained for video visit:  Yes.   Answered questions that patient had about telehealth interaction:  Yes.   I discussed the limitations, risks, security and privacy concerns of performing an evaluation and management service by telemedicine. I also discussed with the patient that there may be a patient responsible charge related to this service. The patient expressed understanding and agreed to proceed.  Pt location: Home Physician Location: office Name of referring provider:  Madelin Headings, MD I connected with Tina Mckee at patients initiation/request on 08/20/2023 at 10:30 AM EDT by video enabled telemedicine application and verified that I am speaking with the correct person using two identifiers. 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  If ineffective, consider Botox Follow up 6-7 months.     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 Still has twitching frequently in the foot when she is sitting.  She now has a leg brace and now more steady when ambulating.    However, she reports difficulty in the left hand.  It may shake at times, causing her to drop objects and sometimes "falls asleep"  falls asleep on left side.  Usually arm not in hand .  NCV-EMG on 02/18/2023 demonstrated evidence of mild left median neuropathy at or distal to the wrist.  She was referred to PT. Much better.  HISTORY: She has congenital mild right upper extremity weakness and spasticity secondary to perinatal stroke.  For several years,  she will fall.  It will occur every 3 months or so.  She says her right leg will just give out.  She does report some right knee pain.  Since around 2013, she has had arrhythmic movements of her right upper extremity and right foot.  She was evaluated by neurology for these movements at that time.  At that time, she also exhibited jerking in the left hand.  MRI of brain on 06/02/2012 personally reviewed revealed old left MCA stroke but no new findings.  She had a routine EEG that revealed intermittent arrhythmic theta and delta slowing in the left central parietal region that appeared to correlate with right arm and leg jerking.  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  She was started on Keppra but discontinued due to dizziness and was instead started on clonazepam.  She was lost to follow up.    She continues to have involuntary right sided movements.  It occurs daily throughout the day, at rest or while in bed.  She says the falls are not associated with involuntary movement of the right lower extremity.      06/02/2012 EEG:  This routine EEG done with the patient awake is abnormal.  Intermittent arrhythmic theta and delta range slowing were seen in particular in the left central parietal regions.  Sometimes,these seemed to correlate with right arm jerking.  Left arm jerks which were much less frequent, did not seem to have a clear correlate.  However, it is felt that these jerking episodes are  likely epileptic in nature. 06/02/2012 MRI BRAIN WO:  1. No acute intracranial abnormality.  2.  Remote left MCA infarct with associated Wallerian degeneration. No chronic blood products.   02/20/2016 MRI BRAIN WO:  1. Unchanged appearance of the brain without acute abnormality or mass.  2. Chronic left MCA infarct. 04/07/2023 EEG normal.   Past medications:  Lamotrigine  Past Medical History: Past Medical History:  Diagnosis Date   Asthma    Gestational  hypertension    Perinatal arterial ischemic stroke (HCC)    Scoliosis    Sleep apnea    Stroke [facility]) birth   rt hemiparesis  Perinatal   Supervision of other normal pregnancy 05/04/2014    Medications: Outpatient Encounter Medications as of 08/20/2023  Medication Sig   amantadine (SYMMETREL) 100 MG capsule TAKE 1 CAPSULE(100 MG) BY MOUTH TWICE DAILY   amLODipine (NORVASC) 5 MG tablet Take 1 tablet (5 mg total) by mouth daily.   citalopram (CELEXA) 20 MG tablet Take 1 tablet (20 mg total) by mouth 2 (two) times daily.   ibuprofen (ADVIL) 600 MG tablet Take 1 tablet (600 mg total) by mouth every 6 (six) hours as needed.   ketoconazole (NIZORAL) 2 % shampoo APPLY TO DAMP SCALP, LATHER AND LET SIT FOR 5 MINUTES THEN RINSE THREE TIMES WEEKLY   levonorgestrel (MIRENA, 52 MG,) 20 MCG/DAY IUD Provided by [facility]   Multiple Vitamins-Calcium (ONE-A-DAY WOMENS PO) Take 1 tablet by mouth daily.   PROAIR HFA 108 (90 Base) MCG/ACT inhaler INHALE 1-2 PUFFS INTO THE LUNGS EVERY 6 (SIX) HOURS AS NEEDED FOR WHEEZING OR SHORTNESS OF BREATH. (Patient taking differently: Inhale 1-2 puffs into the lungs every 6 (six) hours as needed for wheezing or shortness of breath.)   VITAMIN E PO Take by mouth.   No facility-administered encounter medications on file as of 08/20/2023.    Allergies: Allergies  Allergen Reactions   Cephalexin Rash and Itching   Vibramycin [Doxycycline] Itching and Rash    Family History: Family History  Problem Relation Age of Onset   Hypertension Mother    Asthma Mother    Hypertension Father    Sleep apnea Father    Asthma Sister    Cancer Maternal Grandfather        pancreatic   Cancer Paternal Grandmother        cancer all over   Other Paternal Grandfather        brain damage   Other Maternal Grandmother        heart problems    Observations/Objective:   No acute distress.  Alert and oriented.  Speech fluent and not dysarthric.  Language intact.     Follow Up  Instructions:    -I discussed the assessment and treatment plan with the patient. The patient was provided an opportunity to ask questions and all were answered. The patient agreed with the plan and demonstrated an understanding of the instructions.   The patient was advised to call back or seek an in-person evaluation if the symptoms worsen or if the condition fails to improve as anticipated.   Cira Servant, DO     CC: Berniece Andreas, MD

## 2023-08-20 ENCOUNTER — Encounter: Payer: Self-pay | Admitting: Neurology

## 2023-08-20 ENCOUNTER — Telehealth: Payer: BC Managed Care – PPO | Admitting: Neurology

## 2023-08-20 DIAGNOSIS — R258 Other abnormal involuntary movements: Secondary | ICD-10-CM | POA: Diagnosis not present

## 2023-08-20 MED ORDER — AMANTADINE HCL 100 MG PO CAPS: ORAL_CAPSULE | ORAL | 0 refills | Status: DC

## 2023-08-20 NOTE — Patient Instructions (Signed)
Increase amantadine to 1 pill in morning and 2 pills at night for one week, then 2 pills twice daily Follow up 6-7 months.

## 2023-09-06 ENCOUNTER — Other Ambulatory Visit: Payer: Self-pay | Admitting: Neurology

## 2023-09-09 MED ORDER — AMANTADINE HCL 100 MG PO CAPS: ORAL_CAPSULE | ORAL | 5 refills | Status: DC

## 2023-09-10 ENCOUNTER — Encounter: Payer: Self-pay | Admitting: Internal Medicine

## 2023-09-13 ENCOUNTER — Other Ambulatory Visit: Payer: Self-pay | Admitting: Family

## 2023-09-13 MED ORDER — CITALOPRAM HYDROBROMIDE 20 MG PO TABS: 20.0000 mg | ORAL_TABLET | Freq: Two times a day (BID) | ORAL | 2 refills | Status: DC

## 2023-09-16 DIAGNOSIS — G4733 Obstructive sleep apnea (adult) (pediatric): Secondary | ICD-10-CM | POA: Diagnosis not present

## 2023-09-21 ENCOUNTER — Other Ambulatory Visit (HOSPITAL_COMMUNITY): Payer: Self-pay

## 2023-09-24 ENCOUNTER — Telehealth: Payer: Self-pay | Admitting: Internal Medicine

## 2023-09-24 ENCOUNTER — Other Ambulatory Visit (HOSPITAL_COMMUNITY): Payer: Self-pay

## 2023-09-24 NOTE — Telephone Encounter (Signed)
Insurance covers 40MG  - 1 tablet per day. Please advise if alternate therapy is appropriate or if PA is still needed for 20MG  - 2 tablets per day.

## 2023-09-24 NOTE — Telephone Encounter (Signed)
Tina Mckee with [facility] is calling and citalopram needs PA due to qty limit

## 2023-09-30 ENCOUNTER — Encounter: Payer: Self-pay | Admitting: Internal Medicine

## 2023-09-30 ENCOUNTER — Other Ambulatory Visit: Payer: Self-pay | Admitting: Internal Medicine

## 2023-09-30 ENCOUNTER — Ambulatory Visit: Payer: BC Managed Care – PPO | Admitting: Internal Medicine

## 2023-09-30 VITALS — BP 112/84 | HR 89 | Temp 98.1°F | Ht 61.0 in | Wt 237.6 lb

## 2023-09-30 DIAGNOSIS — I10 Essential (primary) hypertension: Secondary | ICD-10-CM

## 2023-09-30 DIAGNOSIS — Z79899 Other long term (current) drug therapy: Secondary | ICD-10-CM

## 2023-09-30 DIAGNOSIS — I69351 Hemiplegia and hemiparesis following cerebral infarction affecting right dominant side: Secondary | ICD-10-CM

## 2023-09-30 DIAGNOSIS — E782 Mixed hyperlipidemia: Secondary | ICD-10-CM

## 2023-09-30 DIAGNOSIS — R454 Irritability and anger: Secondary | ICD-10-CM | POA: Diagnosis not present

## 2023-09-30 DIAGNOSIS — Z0000 Encounter for general adult medical examination without abnormal findings: Secondary | ICD-10-CM

## 2023-09-30 MED ORDER — CITALOPRAM HYDROBROMIDE 40 MG PO TABS: 40.0000 mg | ORAL_TABLET | Freq: Every day | ORAL | 3 refills | Status: DC

## 2023-09-30 NOTE — Progress Notes (Signed)
Order to be done pre visit

## 2023-09-30 NOTE — Progress Notes (Signed)
Chief Complaint  Patient presents with   Medical Management of Chronic Issues    Pt is here to follow up med check on Citalopram. Doing fine. But has trouble with insurance.     HPI: Tina Mckee 32 y.o. come in for  fu meds   Citalopram inc to 40 mg  and more good days than off . Insurance needs pa for  bid dosing . 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 Some interference .  Intermittent fasting .  Seems to help.  With wieght control Walking more .  Brace on rle.may be helpful  for fall prevention  ROS: See pertinent positives and negatives per HPI.  Past Medical History:  Diagnosis Date   Asthma    Gestational hypertension    Perinatal arterial ischemic stroke (HCC)    Scoliosis    Sleep apnea    Stroke [facility]) birth   rt hemiparesis  Perinatal   Supervision of other normal pregnancy 05/04/2014    Family History  Problem Relation Age of Onset   Hypertension Mother    Asthma Mother    Hypertension Father    Sleep apnea Father    Asthma Sister    Cancer Maternal Grandfather        pancreatic   Cancer Paternal Grandmother        cancer all over   Other Paternal Grandfather        brain damage   Other Maternal Grandmother        heart problems    Social History   Socioeconomic History   Marital status: Married    Spouse name: Not on file   Number of children: 2   Years of education: Not on file   Highest education level: Some college, no degree  Occupational History   Occupation: Clinical biochemist  Tobacco Use   Smoking status: Former    Current packs/day: 0.00    Types: Cigarettes    Quit date: 07/11/2007    Years since quitting: 16.2   Smokeless tobacco: Never  Vaping Use   Vaping status: Never Used  Substance and Sexual Activity   Alcohol use: Yes    Alcohol/week: 1.0 standard drink of alcohol    Types: 1 Standard drinks or equivalent per week    Comment: once a month   Drug use: No   Sexual  activity: Yes    Birth control/protection: I.U.D.  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     Currently nonsmoking    HH of 4 no pets     had mild preeclampsia with vaginal childbirth.  2011   cb 12 15 preeclampsi no complicatinos   ocass etoh.    Exercise  Walking     Sleep ok    caffiene limited per day .             Social Determinants of Health   Financial Resource Strain: Low Risk  (09/26/2023)   Overall Financial Resource Strain (CARDIA)    Difficulty of Paying Living Expenses: Not hard at all  Food Insecurity: No Food Insecurity (09/26/2023)   Hunger Vital Sign    Worried About Running Out of Food in the Last Year: Never true    Ran Out of Food in the Last Year: Never true  Transportation Needs: No Transportation Needs (09/26/2023)   PRAPARE - Administrator, Civil Service (Medical): No    Lack of Transportation (Non-Medical): No  Physical Activity: Insufficiently Active (09/26/2023)   Exercise Vital Sign    Days of Exercise per Week: 3 days    Minutes of Exercise per Session: 20 min  Stress: Stress Concern Present (09/26/2023)   [employer] of Occupational Health - Occupational Stress Questionnaire    Feeling of Stress : To some extent  Social Connections: Moderately Integrated (09/26/2023)   Social Connection and Isolation Panel [NHANES]    Frequency of Communication with Friends and Family: More than three times a week    Frequency of Social Gatherings with Friends and Family: Three times a week    Attends Religious Services: Never    Active Member of Clubs or Organizations: Yes    Attends Engineer, structural: More than 4 times per year    Marital Status: Married    Outpatient Medications Prior to Visit  Medication Sig Dispense Refill   amantadine (SYMMETREL) 100 MG capsule Take 1 capsule in morning and 2 capsules at  night for one week, then increase to 2 capsules twice daily 120 capsule 5   amLODipine (NORVASC) 5 MG tablet TAKE 1 TABLET(5 MG) BY MOUTH DAILY 90 tablet 1   ketoconazole (NIZORAL) 2 % shampoo APPLY TO DAMP SCALP, LATHER AND LET SIT FOR 5 MINUTES THEN RINSE THREE TIMES WEEKLY 120 mL 3   levonorgestrel (MIRENA, 52 MG,) 20 MCG/DAY IUD Provided by [facility]     Multiple Vitamins-Calcium (ONE-A-DAY WOMENS PO) Take 1 tablet by mouth daily.     PROAIR HFA 108 (90 Base) MCG/ACT inhaler INHALE 1-2 PUFFS INTO THE LUNGS EVERY 6 (SIX) HOURS AS NEEDED FOR WHEEZING OR SHORTNESS OF BREATH. (Patient taking differently: Inhale 1-2 puffs into the lungs every 6 (six) hours as needed for wheezing or shortness of breath.) 8.5 Inhaler 0   VITAMIN E PO Take by mouth.     citalopram (CELEXA) 20 MG tablet Take 1 tablet (20 mg total) by mouth 2 (two) times daily. 180 tablet 2   No facility-administered medications prior to visit.     EXAM:  BP 112/84 (BP Location: Left Arm, Patient Position: Sitting, Cuff Size: Large)   Pulse 89   Temp 98.1 F (36.7 C) (Oral)   Ht 5\' 1"  (1.549 m)   Wt 237 lb 9.6 oz (107.8 kg) Comment: pt reports she weight 231 this morning at home.  SpO2 94%   BMI 44.89 kg/m   Body mass index is 44.89 kg/m. BP Readings from Last 3 Encounters:  09/30/23 112/84  08/18/23 120/80  06/11/23 (!) 123/94   Wt Readings from Last 3 Encounters:  09/30/23 237 lb 9.6 oz (107.8 kg)  08/18/23 246 lb (111.6 kg)  05/18/23 243 lb 9.6 oz (110.5 kg)     GENERAL: vitals reviewed and listed above, alert, oriented, appears well hydrated and in no acute distress HEENT: atraumatic, conjunctiva  clear, no obvious abnormalities on inspection of external nose and ears  NECK: no obvious masses on inspection palpation  Ambultatory with rle afo brace .   PSYCH: pleasant and cooperative, no obvious depression or anxiety Lab Results  Component Value Date   WBC 8.9 12/23/2022   HGB 15.2 (H) 12/23/2022   HCT  44.4 12/23/2022   PLT 454.0 (H) 12/23/2022   GLUCOSE 101 (H) 12/23/2022   CHOL 178 03/09/2023   TRIG 186.0 (H) 03/09/2023  HDL 36.10 (L) 03/09/2023   LDLDIRECT 161.0 12/16/2021   LDLCALC 105 (H) 03/09/2023   ALT 14 12/23/2022   AST 14 12/23/2022   NA 141 12/23/2022   K 5.1 12/23/2022   CL 106 12/23/2022   CREATININE 0.55 12/23/2022   BUN 20 12/23/2022   CO2 26 12/23/2022   TSH 1.57 12/23/2022   HGBA1C 5.4 12/23/2022   MICROALBUR 0.9 09/20/2012   BP Readings from Last 3 Encounters:  09/30/23 112/84  08/18/23 120/80  06/11/23 (!) 123/94    ASSESSMENT AND PLAN:  Discussed the following assessment and plan:  Medication management  Diastolic hypertension - controlled  Irritability - improved on ctialopram and lse  Hemiparesis affecting right side as late effect of cerebrovascular accident (HCC)  Morbid obesity (HCC) - improving lsi Insurance asking for pa for 60 of 20 mg   rial 40 mg  tabs and can do 1/2 bid or once a day  Message if not tolerating  Otherwise  keep jan appt  and order labs pre visit . -Patient advised to return or notify health care team  if  new concerns arise.  Patient Instructions  Glad   you are doing better .  Lets try citalopram 40 mg per day  9 could cut in half and take 20 mg twice a day.  Let us know if not working out  . Continue lifestyle intervention healthy eating and exercise .   Plan 3 mos follow up as planned  Make  lab appt pre visit .    Neta Mends. Eyan Hagood M.D.

## 2023-09-30 NOTE — Patient Instructions (Addendum)
Glad   you are doing better .  Lets try citalopram 40 mg per day  9 could cut in half and take 20 mg twice a day.  Let us know if not working out  . Continue lifestyle intervention healthy eating and exercise .   Plan 3 mos follow up as planned  Make  lab appt pre visit .

## 2023-10-04 ENCOUNTER — Encounter: Payer: Self-pay | Admitting: Primary Care

## 2023-10-04 ENCOUNTER — Ambulatory Visit: Payer: BC Managed Care – PPO | Admitting: Primary Care

## 2023-10-04 VITALS — BP 110/80 | HR 84 | Ht 61.0 in | Wt 237.2 lb

## 2023-10-04 DIAGNOSIS — G4733 Obstructive sleep apnea (adult) (pediatric): Secondary | ICD-10-CM | POA: Diagnosis not present

## 2023-10-04 NOTE — Patient Instructions (Addendum)
Sleep apnea is well controlled on current pressure settings No changed recommended Renew CPAP supplies with [employer]  Follow-up 1 year with Beth NP or sooner if needed   CPAP and BIPAP Information CPAP and BIPAP are methods that use air pressure to keep your airways open and to help you breathe well. CPAP and BIPAP use different amounts of pressure. Your health care provider will tell you whether CPAP or BIPAP would be more helpful for you. CPAP stands for "continuous positive airway pressure." With CPAP, the amount of pressure stays the same while you breathe in (inhale) and out (exhale). BIPAP stands for "bi-level positive airway pressure." With BIPAP, the amount of pressure will be higher when you inhale and lower when you exhale. This allows you to take larger breaths. CPAP or BIPAP may be used in the hospital, or your health care provider may want you to use it at home. You may need to have a sleep study before your health care provider can order a machine for you to use at home. What are the advantages? CPAP or BIPAP can be helpful if you have: Sleep apnea. Chronic obstructive pulmonary disease (COPD). Heart failure. Medical conditions that cause muscle weakness, including muscular dystrophy or amyotrophic lateral sclerosis (ALS). Other problems that cause breathing to be shallow, weak, abnormal, or difficult. CPAP and BIPAP are most commonly used for obstructive sleep apnea (OSA) to keep the airways from collapsing when the muscles relax during sleep. What are the risks? Generally, this is a safe treatment. However, problems may occur, including: Irritated skin or skin sores if the mask does not fit properly. Dry or stuffy nose or nosebleeds. Dry mouth. Feeling gassy or bloated. Sinus or lung infection if the equipment is not cleaned properly. When should CPAP or BIPAP be used? In most cases, the mask only needs to be worn during sleep. Generally, the mask needs to be worn  throughout the night and during any daytime naps. People with certain medical conditions may also need to wear the mask at other times, such as when they are awake. Follow instructions from your health care provider about when to use the machine. What happens during CPAP or BIPAP?  Both CPAP and BIPAP are provided by a small machine with a flexible plastic tube that attaches to a plastic mask that you wear. Air is blown through the mask into your nose or mouth. The amount of pressure that is used to blow the air can be adjusted on the machine. Your health care provider will set the pressure setting and help you find the best mask for you. Tips for using the mask Because the mask needs to be snug, some people feel trapped or closed-in (claustrophobic) when first using the mask. If you feel this way, you may need to get used to the mask. One way to do this is to hold the mask loosely over your nose or mouth and then gradually apply the mask more snugly. You can also gradually increase the amount of time that you use the mask. Masks are available in various types and sizes. If your mask does not fit well, talk with your health care provider about getting a different one. Some common types of masks include: Full face masks, which fit over the mouth and nose. Nasal masks, which fit over the nose. Nasal pillow or prong masks, which fit into the nostrils. If you are using a mask that fits over your nose and you tend to breathe through your  mouth, a chin strap may be applied to help keep your mouth closed. Use a skin barrier to protect your skin as told by your health care provider. Some CPAP and BIPAP machines have alarms that may sound if the mask comes off or develops a leak. If you have trouble with the mask, it is very important that you talk with your health care provider about finding a way to make the mask easier to tolerate. Do not stop using the mask. There could be a negative impact on your health if  you stop using the mask. Tips for using the machine Place your CPAP or BIPAP machine on a secure table or stand near an electrical outlet. Know where the on/off switch is on the machine. Follow instructions from your health care provider about how to set the pressure on your machine and when you should use it. Do not eat or drink while the CPAP or BIPAP machine is on. Food or fluids could get pushed into your lungs by the pressure of the CPAP or BIPAP. For home use, CPAP and BIPAP machines can be rented or purchased through home health care companies. Many different brands of machines are available. Renting a machine before purchasing may help you find out which particular machine works well for you. Your health insurance company may also decide which machine you may get. Keep the CPAP or BIPAP machine and attachments clean. Ask your health care provider for specific instructions. Check the humidifier if you have a dry stuffy nose or nosebleeds. Make sure it is working correctly. Follow these instructions at home: Take over-the-counter and prescription medicines only as told by your health care provider. Ask if you can take sinus medicine if your sinuses are blocked. Do not use any products that contain nicotine or tobacco. These products include cigarettes, chewing tobacco, and vaping devices, such as e-cigarettes. If you need help quitting, ask your health care provider. Keep all follow-up visits. This is important. Contact a health care provider if: You have redness or pressure sores on your head, face, mouth, or nose from the mask or head gear. You have trouble using the CPAP or BIPAP machine. You cannot tolerate wearing the CPAP or BIPAP mask. Someone tells you that you snore even when wearing your CPAP or BIPAP. Get help right away if: You have trouble breathing. You feel confused. Summary CPAP and BIPAP are methods that use air pressure to keep your airways open and to help you breathe  well. If you have trouble with the mask, it is very important that you talk with your health care provider about finding a way to make the mask easier to tolerate. Do not stop using the mask. There could be a negative impact to your health if you stop using the mask. Follow instructions from your health care provider about when to use the machine. This information is not intended to replace advice given to you by your health care provider. Make sure you discuss any questions you have with your health care provider. Document Revised: 07/09/2021 Document Reviewed: 11/08/2020 Elsevier Patient Education  2023 [employer].

## 2023-10-04 NOTE — Progress Notes (Signed)
@Patient  ID: Tina Mckee, female    DOB: June 21, 1991, 32 y.o.   MRN: [id number]  Chief Complaint  Patient presents with   Follow-up    Referring provider: Madelin Headings, MD  HPI: 32 year old female, former smoker quit in 2008.  Past medical history significant for OSA.  Home sleep study showed mild OSA, AHI 13/hour (events were severe during supine sleep).  Previous [facility] pulmonary encounter: 10/01/2022 Patient presents today for annual follow-up for OSA.  She is doing well. She is 100% compliant with CPAP and reports benefit from use. She has more energy during the daytime since being started on PAP therapy. She is sleeping well through the night, she will only wake up if her daughter comes into her room. She has no trouble falling back to sleep. No issues with pressure settings or mask fit. She wears nasal mask. Current setting 5 to 15 cm H2O. No residual daytime sleepiness. 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   [facility] download 09/01/2022 - 09/30/2022 30/30 days used; 100% greater than 4 hours Average usage 7 hours 33 minutes Pressure 5 to 15 cm H2O (95th percent tile-11.8 cm H2O) Air leaks 1.2 L/min (95 percentile) AHI 2.6   10/04/2023- Interim hx  Patient presents today for 1 year OSA/CPAP fu Sleeping well, reports benefit CPAP use No issues with mask fit, pressure setting or machine  She is going to bed earlier than she previously was She has been working on weight loss, currently doing intermittent fasting She uses nasal mask No significant daytime  sleepiness. [facility] DME is [employer]   [employer] download 09/01/23- 09/30/23 Usage 30/30 (100%) > 4 hours Average 7 hours 51 mins Pressure 5-15cm h20 (11.1cm h20-95%) Airleaks 1.6L (min) AHI 1.6  Allergies  Allergen Reactions   Cephalexin Rash and Itching   Vibramycin [Doxycycline] Itching and Rash    Immunization History  Administered Date(s) Administered   DTP 06/22/1989, 09/02/1989, 10/26/1989, 01/04/1991, 05/11/1994   HIB  (PRP-OMP) 10/26/1989, 05/28/1990, 07/22/1990   Hepatitis B 08/31/2000, 10/05/2000, 02/22/2001   Influenza Split 09/21/2011, 09/27/2012   Influenza,inj,Quad PF,6+ Mos 09/29/2013, 09/25/2014, 12/19/2015, 10/13/2016, 10/15/2017   Influenza-Unspecified 10/15/2017, 10/19/2018, 11/08/2019, 10/19/2020, 12/02/2021   MMR 07/22/1990, 05/11/1994   Moderna Sars-Covid-2 Vaccination 02/10/2020, 03/09/2020, 10/19/2020   OPV 06/22/1989, 09/02/1989, 01/04/1991, 05/11/1994   PFIZER Comirnaty(Gray Top)Covid-19 Tri-Sucrose Vaccine 12/02/2021   Td 07/15/2001   Tdap 08/03/2011, 11/14/2014    Past Medical History:  Diagnosis Date   Asthma    Gestational hypertension    Perinatal arterial ischemic stroke (HCC)    Scoliosis    Sleep apnea    Stroke (HCC) birth   rt hemiparesis  Perinatal   Supervision of other normal pregnancy 05/04/2014    Tobacco History: Social History   Tobacco Use  Smoking Status Former   Current packs/day: 0.00   Types: Cigarettes   Quit date: 07/11/2007   Years since quitting: 16.2  Smokeless Tobacco Never   Counseling given: Not Answered   Outpatient Medications Prior to Visit  Medication Sig Dispense Refill   amantadine (SYMMETREL) 100 MG capsule Take 1 capsule in morning and 2 capsules at night for one week, then increase to 2 capsules twice daily 120 capsule 5   amLODipine (NORVASC) 5 MG tablet TAKE 1 TABLET(5 MG) BY MOUTH DAILY 90 tablet 1   citalopram (CELEXA) 40 MG tablet Take 1 tablet (40 mg total) by mouth daily. 30 tablet 3   ketoconazole (NIZORAL) 2 % shampoo APPLY TO DAMP SCALP, LATHER AND LET SIT FOR 5  MINUTES THEN RINSE THREE TIMES WEEKLY 120 mL 3   levonorgestrel (MIRENA, 52 MG,) 20 MCG/DAY IUD Provided by [facility]     Multiple Vitamins-Calcium (ONE-A-DAY WOMENS PO) Take 1 tablet by mouth daily.     PROAIR HFA 108 (90 Base) MCG/ACT inhaler INHALE 1-2 PUFFS INTO THE LUNGS EVERY 6 (SIX) HOURS AS NEEDED FOR WHEEZING OR SHORTNESS OF BREATH. (Patient taking  differently: Inhale 1-2 puffs into the lungs every 6 (six) hours as needed for wheezing or shortness of breath.) 8.5 Inhaler 0   VITAMIN E PO Take by mouth.     No facility-administered medications prior to visit.      Review of Systems  Review of Systems  Constitutional: Negative.  Negative for fever.  Respiratory: Negative.       Physical Exam  BP 110/80 (BP Location: Right Arm, Cuff Size: Large)   Pulse 84   Ht 5\' 1"  (1.549 m)   Wt 237 lb 3.2 oz (107.6 kg)   SpO2 97%   BMI 44.82 kg/m  Physical Exam Constitutional:      Appearance: Normal appearance.  HENT:     Head: Normocephalic and atraumatic.  Cardiovascular:     Rate and Rhythm: Normal rate and regular rhythm.  Pulmonary:     Effort: Pulmonary effort is normal.     Breath sounds: Normal breath sounds. No wheezing, rhonchi or rales.  Neurological:     General: No focal deficit present.     Mental Status: She is alert and oriented to person, place, and time. Mental status is at baseline.  Psychiatric:        Mood and Affect: Mood normal.        Behavior: Behavior normal.        Thought Content: Thought content normal.        Judgment: Judgment normal.      Lab Results:  CBC    Component Value Date/Time   WBC 8.9 12/23/2022 1103   RBC 4.99 12/23/2022 1103   HGB 15.2 (H) 12/23/2022 1103   HCT 44.4 12/23/2022 1103   PLT 454.0 (H) 12/23/2022 1103   MCV 88.9 12/23/2022 1103   MCH 28.8 02/22/2021 0750   MCHC 34.2 12/23/2022 1103   RDW 13.1 12/23/2022 1103   LYMPHSABS 2.6 12/23/2022 1103   MONOABS 0.5 12/23/2022 1103   EOSABS 0.4 12/23/2022 1103   BASOSABS 0.1 12/23/2022 1103    BMET    Component Value Date/Time   NA 141 12/23/2022 1103   K 5.1 12/23/2022 1103   CL 106 12/23/2022 1103   CO2 26 12/23/2022 1103   GLUCOSE 101 (H) 12/23/2022 1103   BUN 20 12/23/2022 1103   CREATININE 0.55 12/23/2022 1103   CREATININE 0.43 (L) 12/05/2014 1148   CALCIUM 9.1 12/23/2022 1103   GFRNONAA >60  02/22/2021 0750   GFRAA >60 09/12/2019 2129    BNP No results found for: "BNP"  ProBNP No results found for: "PROBNP"  Imaging: No results found.   Assessment & Plan:   OSA (obstructive sleep apnea) - Mild OSA well-controlled on CPAP; HST August 2021>> AHI 13/hr - Reports benefit from wearing CPAP.  No residual daytime sleepiness.  No issues with mask or pressure settings - Current pressure 5 to 15 cm H2O; Residual AHI 1.6 - No changes today, renew CPAP supplies with [employer] [facility] - Patient will continue to wear her CPAP nightly for 4 to 6 hours or longer, encourage weight loss efforts as able - Follow-up in 1  year or sooner if needed   Glenford Bayley, NP 10/04/2023

## 2023-10-04 NOTE — Assessment & Plan Note (Signed)
-   Mild OSA well-controlled on CPAP; HST August 2021>> AHI 13/hr - Reports benefit from wearing CPAP.  No residual daytime sleepiness.  No issues with mask or pressure settings - Current pressure 5 to 15 cm H2O; Residual AHI 1.6 - No changes today, renew CPAP supplies with [employer] - Patient will continue to wear her CPAP nightly for 4 to 6 hours or longer, encourage weight loss efforts as able - Follow-up in 1 year or sooner if needed

## 2023-10-08 ENCOUNTER — Other Ambulatory Visit: Payer: Self-pay | Admitting: Family

## 2023-11-23 ENCOUNTER — Telehealth: Payer: BC Managed Care – PPO | Admitting: Physician Assistant

## 2023-11-23 DIAGNOSIS — R0602 Shortness of breath: Secondary | ICD-10-CM

## 2023-11-23 NOTE — Progress Notes (Signed)
Because of severity of difficulty breathing and feeling like an elephant is sitting on your chest with this and need to rule out pneumonia, etc., I feel your condition warrants further evaluation and I recommend that you be seen in a face to face visit.   NOTE: There will be NO CHARGE for this eVisit   If you are having a true medical emergency please call 911.      For an urgent face to face visit, [place] has eight urgent care centers for your convenience:   NEW!! [facility] Center at [facility] Get Driving Directions [phone] [address] [place], [address]    [facility] Health Urgent Care Center at [facility] Get Driving Directions [phone] [address] [place], [place] [address]   [facility] Health Urgent Care Center [facility]) Get Driving Directions [phone] [address] [place], [place] [address]  [facility] Health Urgent Care Center [facility]) Get Driving Directions [phone] [address] [place],  [place]  [address]  [facility] Health Urgent Care Center [facility] at [employer] [facility]  [phone] [address] [place],  [place] [address]   [facility] Health Urgent Care at [facility] Get Driving Directions [phone] [address] [place], [place] [address]   [facility] Health Urgent Care at [facility] Get Driving Directions  [phone] [address] [place], [place] [address]   [facility] Health Urgent Care at [facility] Directions [phone] [address] [place], [place] [address]  Your MyChart E-visit questionnaire answers were reviewed by a board certified advanced clinical practitioner to complete your personal care plan based on your specific symptoms.  Thank you for using e-Visits.

## 2023-11-24 ENCOUNTER — Encounter: Payer: Self-pay | Admitting: Family Medicine

## 2023-11-24 ENCOUNTER — Ambulatory Visit: Payer: BC Managed Care – PPO | Admitting: Family Medicine

## 2023-11-24 VITALS — BP 126/80 | HR 82 | Temp 97.9°F | Resp 12 | Ht 61.0 in | Wt 230.0 lb

## 2023-11-24 DIAGNOSIS — R0981 Nasal congestion: Secondary | ICD-10-CM

## 2023-11-24 DIAGNOSIS — J069 Acute upper respiratory infection, unspecified: Secondary | ICD-10-CM

## 2023-11-24 MED ORDER — PREDNISONE 20 MG PO TABS: 40.0000 mg | ORAL_TABLET | Freq: Every day | ORAL | 0 refills | Status: AC

## 2023-11-24 MED ORDER — FLUTICASONE PROPIONATE 50 MCG/ACT NA SUSP: 1.0000 | Freq: Two times a day (BID) | NASAL | 0 refills | Status: DC

## 2023-11-24 MED ORDER — BENZONATATE 100 MG PO CAPS
200.0000 mg | ORAL_CAPSULE | Freq: Two times a day (BID) | ORAL | 0 refills | Status: AC | PRN
Start: 2023-11-24 — End: 2023-12-04

## 2023-11-24 NOTE — Progress Notes (Signed)
ACUTE VISIT Chief Complaint  Patient presents with  . Cough  . Nasal Congestion   HPI: [name] is a 32 y.o. female with a PMHx significant for diastolic HTN, OSA, hemiparesis, thrombocytosis, hx of stroke, HLD, and insomnia, among some, who is here today complaining of cough and congestion.   Patient complains of cough and congestion since 12/7. Endorses associated rhinorrhea, night sweats, feeling feverish, chills, sore throat on 12/7 and 12/8, postnasal drainage, wheezing, SOB, ear ache, and diarrhea one time.  She says it feels like something heavy is on her chest, and like she cannot get enough air into her lungs when she breathes.  She mentions the congestion has been interfering with her sleep. She uses a CPAP, and says she has to take her mask off several times per night because of the congestion.  She took a covid test on 12/9 which was negative.  She has been taking Mucinex and Delsym. Has not tried a nasal spray.  Pertinent negatives include nausea, vomiting, abdominal pain, or known sick contacts.   She has a history of allergies, but says they usually flare up in August and September.  She doesn't smoke.   Review of Systems See other pertinent positives and negatives in HPI.  Current Outpatient Medications on File Prior to Visit  Medication Sig Dispense Refill  . amantadine (SYMMETREL) 100 MG capsule Take 1 capsule in morning and 2 capsules at night for one week, then increase to 2 capsules twice daily 120 capsule 5  . amLODipine (NORVASC) 5 MG tablet TAKE 1 TABLET(5 MG) BY MOUTH DAILY 90 tablet 1  . citalopram (CELEXA) 40 MG tablet Take 1 tablet (40 mg total) by mouth daily. 30 tablet 3  . ketoconazole (NIZORAL) 2 % shampoo APPLY TO DAMP SCALP, LATHER AND LET SIT FOR 5 MINUTES THEN RINSE THREE TIMES WEEKLY 120 mL 3  . levonorgestrel (MIRENA, 52 MG,) 20 MCG/DAY IUD Provided by [facility]    . Multiple Vitamins-Calcium (ONE-A-DAY WOMENS PO) Take 1 tablet by  mouth daily.    Marland Kitchen PROAIR HFA 108 (90 Base) MCG/ACT inhaler INHALE 1-2 PUFFS INTO THE LUNGS EVERY 6 (SIX) HOURS AS NEEDED FOR WHEEZING OR SHORTNESS OF BREATH. (Patient taking differently: Inhale 1-2 puffs into the lungs every 6 (six) hours as needed for wheezing or shortness of breath.) 8.5 Inhaler 0  . VITAMIN E PO Take by mouth.     No current facility-administered medications on file prior to visit.    Past Medical History:  Diagnosis Date  . Asthma   . Gestational hypertension   . Perinatal arterial ischemic stroke (HCC)   . Scoliosis   . Sleep apnea   . Stroke [facility]) birth   rt hemiparesis  Perinatal  . Supervision of other normal pregnancy 05/04/2014   Allergies  Allergen Reactions  . Cephalexin Rash and Itching  . Vibramycin [Doxycycline] Itching and Rash    Social History   Socioeconomic History  . Marital status: Married    Spouse name: Not on file  . Number of children: 2  . Years of education: Not on file  . Highest education level: Some college, no degree  Occupational History  . Occupation: Clinical biochemist  Tobacco Use  . Smoking status: Former    Current packs/day: 0.00    Types: Cigarettes    Quit date: 07/11/2007    Years since quitting: 16.3  . Smokeless tobacco: Never  Vaping Use  . Vaping status: Never Used  Substance and  Sexual Activity  . Alcohol use: Yes    Alcohol/week: 1.0 standard drink of alcohol    Types: 1 Standard drinks or equivalent per week    Comment: once a month  . Drug use: No  . Sexual activity: Yes    Birth control/protection: I.U.D.  Other Topics Concern  . 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     Currently nonsmoking    HH of 4 no pets     had mild preeclampsia with vaginal childbirth.  2011   cb 12 15 preeclampsi no complicatinos   ocass etoh.    Exercise  Walking     Sleep ok    caffiene limited per day .              Social Determinants of Health   Financial Resource Strain: Low Risk  (09/26/2023)   Overall Financial Resource Strain (CARDIA)   . Difficulty of Paying Living Expenses: Not hard at all  Food Insecurity: No Food Insecurity (09/26/2023)   Hunger Vital Sign   . Worried About Programme researcher, broadcasting/film/video in the Last Year: Never true   . Ran Out of Food in the Last Year: Never true  Transportation Needs: No Transportation Needs (09/26/2023)   PRAPARE - Transportation   . Lack of Transportation (Medical): No   . Lack of Transportation (Non-Medical): No  Physical Activity: Insufficiently Active (09/26/2023)   Exercise Vital Sign   . Days of Exercise per Week: 3 days   . Minutes of Exercise per Session: 20 min  Stress: Stress Concern Present (09/26/2023)   [employer] of Occupational Health - Occupational Stress Questionnaire   . Feeling of Stress : To some extent  Social Connections: Moderately Integrated (09/26/2023)   Social Connection and Isolation Panel [NHANES]   . Frequency of Communication with Friends and Family: More than three times a week   . Frequency of Social Gatherings with Friends and Family: Three times a week   . Attends Religious Services: Never   . Active Member of Clubs or Organizations: Yes   . Attends Banker Meetings: More than 4 times per year   . Marital Status: Married    There were no vitals filed for this visit. Body mass index is 44.82 kg/m.  Physical Exam Constitutional:      General: She is not in acute distress.    Appearance: She is well-developed. She is not ill-appearing.  HENT:     Head: Atraumatic.     Right Ear: Tympanic membrane, ear canal and external ear normal. Tympanic membrane is not erythematous.     Left Ear: Tympanic membrane, ear canal and external ear normal. Tympanic membrane is not erythematous.     Nose: Congestion and rhinorrhea present.     Right Sinus: No maxillary sinus tenderness or frontal sinus tenderness.      Left Sinus: No maxillary sinus tenderness or frontal sinus tenderness.     Mouth/Throat:     Mouth: Mucous membranes are moist.     Pharynx: Postnasal drip present. No posterior oropharyngeal erythema.  Eyes:     Conjunctiva/sclera: Conjunctivae normal.  Neck:     Comments: Bilateral lymph nodes *** Cardiovascular:     Rate and Rhythm: Normal rate and regular rhythm.     Heart sounds: No murmur heard. Pulmonary:     Effort: Pulmonary  effort is normal. No respiratory distress.     Breath sounds: Normal breath sounds. No stridor.  Musculoskeletal:     Cervical back: No edema or erythema. No muscular tenderness.  Lymphadenopathy:     Head:     Right side of head: No submandibular adenopathy.     Left side of head: No submandibular adenopathy.     Cervical: Cervical adenopathy present.  Skin:    General: Skin is warm.     Findings: No erythema or rash.  Neurological:     Mental Status: She is alert and oriented to person, place, and time.  Psychiatric:        Speech: Speech normal.     Comments: Well groomed, good eye contact.    ASSESSMENT AND PLAN:  Tina Mckee was seen today for cough and congestion.   There are no diagnoses linked to this encounter.  No follow-ups on file.  I, Suanne Marker, acting as a scribe for Kaylani Fromme [place], MD., have documented all relevant documentation on the behalf of Tina Gathright [place], MD, as directed by  Amaira Safley [place], MD while in the presence of Saskia Simerson [place], MD.   I, Suanne Marker, have reviewed all documentation for this visit. The documentation on 11/24/23 for the exam, diagnosis, procedures, and orders are all accurate and complete.  Donneisha Beane G. [place], MD  [facility]. [place] office.  Discharge Instructions   None

## 2023-11-24 NOTE — Patient Instructions (Addendum)
A few things to remember from today's visit:  URI, acute - Plan: benzonatate (TESSALON) 100 MG capsule  Nasal congestion - Plan: predniSONE (DELTASONE) 20 MG tablet, fluticasone (FLONASE) 50 MCG/ACT nasal spray  Flonase nasal spray at night for 10-14 days. Prednisone may help with congestion as well as Mucinex D. Nasal saline irrigations several times per day and at bedtime before using Flonase.  If you need refills for medications you take chronically, please call your pharmacy. Do not use My Chart to request refills or for acute issues that need immediate attention. If you send a my chart message, it may take a few days to be addressed, specially if I am not in the office.  Please be sure medication list is accurate. If a new problem present, please set up appointment sooner than planned today.

## 2023-12-21 ENCOUNTER — Other Ambulatory Visit: Payer: Self-pay | Admitting: Family Medicine

## 2023-12-21 DIAGNOSIS — R0981 Nasal congestion: Secondary | ICD-10-CM

## 2023-12-22 ENCOUNTER — Other Ambulatory Visit (INDEPENDENT_AMBULATORY_CARE_PROVIDER_SITE_OTHER): Payer: BC Managed Care – PPO

## 2023-12-22 DIAGNOSIS — I10 Essential (primary) hypertension: Secondary | ICD-10-CM | POA: Diagnosis not present

## 2023-12-22 DIAGNOSIS — Z79899 Other long term (current) drug therapy: Secondary | ICD-10-CM | POA: Diagnosis not present

## 2023-12-22 DIAGNOSIS — Z0000 Encounter for general adult medical examination without abnormal findings: Secondary | ICD-10-CM | POA: Diagnosis not present

## 2023-12-22 DIAGNOSIS — I69351 Hemiplegia and hemiparesis following cerebral infarction affecting right dominant side: Secondary | ICD-10-CM

## 2023-12-22 DIAGNOSIS — E782 Mixed hyperlipidemia: Secondary | ICD-10-CM

## 2023-12-22 LAB — HEPATIC FUNCTION PANEL
ALT: 11 U/L (ref 0–35)
AST: 10 U/L (ref 0–37)
Albumin: 4.4 g/dL (ref 3.5–5.2)
Alkaline Phosphatase: 74 U/L (ref 39–117)
Bilirubin, Direct: 0.2 mg/dL (ref 0.0–0.3)
Total Bilirubin: 1.2 mg/dL (ref 0.2–1.2)
Total Protein: 6.4 g/dL (ref 6.0–8.3)

## 2023-12-22 LAB — CBC WITH DIFFERENTIAL/PLATELET
Basophils Absolute: 0 10*3/uL (ref 0.0–0.1)
Basophils Relative: 0.6 % (ref 0.0–3.0)
Eosinophils Absolute: 0.4 10*3/uL (ref 0.0–0.7)
Eosinophils Relative: 5.4 % — ABNORMAL HIGH (ref 0.0–5.0)
HCT: 44.4 % (ref 36.0–46.0)
Hemoglobin: 15.1 g/dL — ABNORMAL HIGH (ref 12.0–15.0)
Lymphocytes Relative: 32.9 % (ref 12.0–46.0)
Lymphs Abs: 2.6 10*3/uL (ref 0.7–4.0)
MCHC: 34.1 g/dL (ref 30.0–36.0)
MCV: 88.7 fL (ref 78.0–100.0)
Monocytes Absolute: 0.5 10*3/uL (ref 0.1–1.0)
Monocytes Relative: 6.3 % (ref 3.0–12.0)
Neutro Abs: 4.3 10*3/uL (ref 1.4–7.7)
Neutrophils Relative %: 54.8 % (ref 43.0–77.0)
Platelets: 388 10*3/uL (ref 150.0–400.0)
RBC: 5.01 Mil/uL (ref 3.87–5.11)
RDW: 13 % (ref 11.5–15.5)
WBC: 7.9 10*3/uL (ref 4.0–10.5)

## 2023-12-22 LAB — LIPID PANEL
Cholesterol: 209 mg/dL — ABNORMAL HIGH (ref 0–200)
HDL: 37.5 mg/dL — ABNORMAL LOW (ref >39.00)
LDL Cholesterol: 123 mg/dL — ABNORMAL HIGH (ref 0–99)
NonHDL: 171.82
Total CHOL/HDL Ratio: 6
Triglycerides: 244 mg/dL — ABNORMAL HIGH (ref 0.0–149.0)
VLDL: 48.8 mg/dL — ABNORMAL HIGH (ref 0.0–40.0)

## 2023-12-22 LAB — HEMOGLOBIN A1C: Hgb A1c MFr Bld: 5.3 % (ref 4.6–6.5)

## 2023-12-22 LAB — BASIC METABOLIC PANEL
BUN: 15 mg/dL (ref 6–23)
CO2: 27 meq/L (ref 19–32)
Calcium: 9.2 mg/dL (ref 8.4–10.5)
Chloride: 104 meq/L (ref 96–112)
Creatinine, Ser: 0.5 mg/dL (ref 0.40–1.20)
GFR: 122.14 mL/min (ref >60.00)
Glucose, Bld: 97 mg/dL (ref 70–99)
Potassium: 4.3 meq/L (ref 3.5–5.1)
Sodium: 139 meq/L (ref 135–145)

## 2023-12-22 LAB — MICROALBUMIN / CREATININE URINE RATIO
Creatinine,U: 219.2 mg/dL
Microalb Creat Ratio: 1.2 mg/g (ref 0.0–30.0)
Microalb, Ur: 2.6 mg/dL — ABNORMAL HIGH (ref 0.0–1.9)

## 2023-12-22 LAB — TSH: TSH: 1.91 u[IU]/mL (ref 0.35–5.50)

## 2023-12-26 NOTE — Progress Notes (Signed)
 Chief Complaint  Patient presents with   Annual Exam    HPI: Patient  Tina Mckee  33 y.o. comes in today for Preventive Health Care visit  Still working on  weight and diet   not as good over holidays and travel . Bp  didn't take this.med today   126/83 at home  usually  No falls   with afo that helped  Some tingling in left hand ? Cts crochets   a lot   Mood feels pretty good on current  meds and situation  Derm checks moles but would like  more HS intervnetion Health Maintenance  Topic Date Due   Cervical Cancer Screening (HPV/Pap Cotest)  03/27/2024 (Originally 10/14/2019)   COVID-19 Vaccine (6 - 2024-25 season) 03/27/2024 (Originally 08/15/2023)   Hepatitis C Screening  07/27/2024 (Originally 04/13/2007)   DTaP/Tdap/Td (9 - Td or Tdap) 11/14/2024   INFLUENZA VACCINE  Completed   HIV Screening  Completed   HPV VACCINES  Aged Out   Health Maintenance Review LIFESTYLE:  Exercise:  up and down with dog  Tobacco/ETS:n Alcohol  Sugar beverages:n Sleep:ok  Drug use: no HH of  pet dog   ROS:  GEN/ HEENT: No fever, significant weight changes sweats headaches vision problems hearing changes, CV/ PULM; No chest pain shortness of breath cough, syncope,edema  change in exercise tolerance. GI /GU: No adominal pain, vomiting, change in bowel habits. No blood in the stool. No significant GU symptoms. SKIN/HEME: ,no acute skin rashes suspicious lesions or bleeding. No lymphadenopathy, nodules, masses.  NEURO/ PSYCH:  No neurologic signs such as weakness numbness. No depression anxiety. IMM/ Allergy: No unusual infections.  Allergy .   REST of 12 system review negative except as per HPI   Past Medical History:  Diagnosis Date   Asthma    Gestational hypertension    Perinatal arterial ischemic stroke (HCC)    Scoliosis    Sleep apnea    Stroke [facility]) birth   rt hemiparesis  Perinatal   Supervision of other normal pregnancy 05/04/2014    Past Surgical History:  Procedure  Laterality Date   FOOT SURGERY     rt foot   FOOT SURGERY     Bone placed   moles removed     from back    Family History  Problem Relation Age of Onset   Hypertension Mother    Asthma Mother    Hypertension Father    Sleep apnea Father    Asthma Sister    Cancer Maternal Grandfather        pancreatic   Cancer Paternal Grandmother        cancer all over   Other Paternal Grandfather        brain damage   Other Maternal Grandmother        heart problems    Social History   Socioeconomic History   Marital status: Married    Spouse name: Not on file   Number of children: 2   Years of education: Not on file   Highest education level: Some college, no degree  Occupational History   Occupation: Clinical Biochemist  Tobacco Use   Smoking status: Former    Current packs/day: 0.00    Types: Cigarettes    Quit date: 07/11/2007    Years since quitting: 16.4   Smokeless tobacco: Never  Vaping Use   Vaping status: Never Used  Substance and Sexual Activity   Alcohol use: Yes    Alcohol/week: 1.0  standard drink of alcohol    Types: 1 Standard drinks or equivalent per week    Comment: once a month   Drug use: No   Sexual activity: Yes    Birth control/protection: I.U.D.  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     Currently nonsmoking    HH of 4 no pets     had mild preeclampsia with vaginal childbirth.  2011   cb 12 15 preeclampsi no complicatinos   ocass etoh.    Exercise  Walking     Sleep ok    caffiene limited per day .             Social Drivers of Corporate Investment Banker Strain: Low Risk  (09/26/2023)   Overall Financial Resource Strain (CARDIA)    Difficulty of Paying Living Expenses: Not hard at all  Food Insecurity: No Food Insecurity (09/26/2023)   Hunger Vital Sign    Worried About Running Out of Food in the Last Year: Never true     Ran Out of Food in the Last Year: Never true  Transportation Needs: No Transportation Needs (09/26/2023)   PRAPARE - Administrator, Civil Service (Medical): No    Lack of Transportation (Non-Medical): No  Physical Activity: Insufficiently Active (09/26/2023)   Exercise Vital Sign    Days of Exercise per Week: 3 days    Minutes of Exercise per Session: 20 min  Stress: Stress Concern Present (09/26/2023)   [employer] of Occupational Health - Occupational Stress Questionnaire    Feeling of Stress : To some extent  Social Connections: Moderately Integrated (09/26/2023)   Social Connection and Isolation Panel [NHANES]    Frequency of Communication with Friends and Family: More than three times a week    Frequency of Social Gatherings with Friends and Family: Three times a week    Attends Religious Services: Never    Active Member of Clubs or Organizations: Yes    Attends Engineer, Structural: More than 4 times per year    Marital Status: Married    Outpatient Medications Prior to Visit  Medication Sig Dispense Refill   amantadine  (SYMMETREL ) 100 MG capsule Take 1 capsule in morning and 2 capsules at night for one week, then increase to 2 capsules twice daily 120 capsule 5   amLODipine  (NORVASC ) 5 MG tablet TAKE 1 TABLET(5 MG) BY MOUTH DAILY 90 tablet 1   citalopram  (CELEXA ) 40 MG tablet Take 1 tablet (40 mg total) by mouth daily. 30 tablet 3   fluticasone  (FLONASE ) 50 MCG/ACT nasal spray SHAKE LIQUID AND USE 1 SPRAY IN EACH NOSTRIL TWICE DAILY 16 g 0   ketoconazole  (NIZORAL ) 2 % shampoo APPLY TO DAMP SCALP, LATHER AND LET SIT FOR 5 MINUTES THEN RINSE THREE TIMES WEEKLY 120 mL 3   levonorgestrel  (MIRENA , 52 MG,) 20 MCG/DAY IUD Provided by [facility]     Multiple Vitamins-Calcium  (ONE-A-DAY WOMENS PO) Take 1 tablet by mouth daily.     PROAIR  HFA 108 (90 Base) MCG/ACT inhaler INHALE 1-2 PUFFS INTO THE LUNGS EVERY 6 (SIX) HOURS AS NEEDED FOR WHEEZING OR SHORTNESS OF  BREATH. (Patient taking differently: Inhale 1-2 puffs into the lungs every 6 (six) hours as needed for wheezing or shortness of breath.) 8.5 Inhaler 0   VITAMIN E PO Take  by mouth.     No facility-administered medications prior to visit.     EXAM:  BP 128/86 (BP Location: Left Arm, Patient Position: Sitting, Cuff Size: Large)   Pulse 81   Temp 97.9 F (36.6 C) (Oral)   Ht 5' 2 (1.575 m)   Wt 232 lb 6.4 oz (105.4 kg)   SpO2 97%   BMI 42.51 kg/m   Body mass index is 42.51 kg/m. Wt Readings from Last 3 Encounters:  12/28/23 232 lb 6.4 oz (105.4 kg)  11/24/23 230 lb (104.3 kg)  10/04/23 237 lb 3.2 oz (107.6 kg)   Reported weight without afo at home  228 Physical Exam: Vital signs reviewed HZW:Uypd is a well-developed well-nourished alert cooperative    who appearsr stated age in no acute distress.  R hemiparesis   HEENT: normocephalic atraumatic , Eyes: PERRL EOM's full, conjunctiva clear, Nares: paten,t no deformity discharge or tenderness., Ears: no deformity EAC's clear TMs with normal landmarks. Mouth: clear OP, no lesions, edema.  Moist mucous membranes. Dentition in adequate repair. NECK: supple without masses, thyromegaly or bruits. CHEST/PULM:  Clear to auscultation and percussion breath sounds equal no wheeze , rales or rhonchi. No chest wall deformities or tenderness. Breast: normal by inspection . No dimpling, discharge, masses, tenderness or discharge . CV: PMI is nondisplaced, S1 S2 no gallops, murmurs, rubs. Peripheral pulses are full without delay.No JVD .  ABDOMEN: Bowel sounds normal nontender  No guard or rebound, no hepato splenomegal no CVA tenderness.  Extremtities:  No clubbing cyanosis or edema, no acute joint swelling or redness r hemiparesis  independent gait   left wrist no swelling nl  position  NEURO:  Oriented x3, cranial nerves 3-12 appear to be intact, no obvious focal weakness,gait within normal limits no abnormal reflexes or asymmetrical SKIN: No  acute rashes normal turgor, color, no bruising or petechiae. PSYCH: Oriented, good eye contact, no obvious depression anxiety, cognition and judgment appear normal. LN: no cervical axillary  adenopathy  Lab Results  Component Value Date   WBC 7.9 12/22/2023   HGB 15.1 (H) 12/22/2023   HCT 44.4 12/22/2023   PLT 388.0 12/22/2023   GLUCOSE 97 12/22/2023   CHOL 209 (H) 12/22/2023   TRIG 244.0 (H) 12/22/2023   HDL 37.50 (L) 12/22/2023   LDLDIRECT 161.0 12/16/2021   LDLCALC 123 (H) 12/22/2023   ALT 11 12/22/2023   AST 10 12/22/2023   NA 139 12/22/2023   K 4.3 12/22/2023   CL 104 12/22/2023   CREATININE 0.50 12/22/2023   BUN 15 12/22/2023   CO2 27 12/22/2023   TSH 1.91 12/22/2023   HGBA1C 5.3 12/22/2023   MICROALBUR 2.6 (H) 12/22/2023    BP Readings from Last 3 Encounters:  12/28/23 128/86  11/24/23 126/80  10/04/23 110/80    Lab results reviewed with patient  The ASCVD Risk score (Arnett DK, et al., 2019) failed to calculate for the following reasons:   The 2019 ASCVD risk score is only valid for ages 63 to 33   Risk score cannot be calculated because patient has a medical history suggesting prior/existing ASCVD  ASSESSMENT AND PLAN:  Discussed the following assessment and plan:    ICD-10-CM   1. Visit for preventive health examination  Z00.00     2. Medication management  Z79.899     3. Hemiparesis affecting right side as late effect of cerebrovascular accident (HCC)  I69.351     4. Mixed hyperlipidemia  E78.2     5.  History of stroke  Z86.73     6. Influenza vaccine needed  Z23 Flu vaccine trivalent PF, 6mos and older(Flulaval,Afluria,Fluarix,Fluzone)    7. Dyslipidemia (high LDL; low HDL)  E78.5     8. Hidradenitis suppurativa  L73.2     Disc options  at this time  she will track and work on  more intensive diet other changes  and close fu 3 mos  .  Consider  glp1 but    iis not a ling trem solutions   and we will follow  consider  plant based type foods   Return in about 3 months (around 03/27/2024) for 3-4 months   FLP a1c then fu virtual or in person.  Patient Care Team: Eleonore Shippee, Apolinar POUR, MD as PCP - General Susen Elsie DEL, MD (Inactive) (Neurology) Barbarann Oneil BROCKS, MD (Orthopedic Surgery) Leva Rush, MD as Consulting Physician (Obstetrics and Gynecology) Parrett, Madelin RAMAN, NP as Nurse Practitioner (Pulmonary Disease) Skeet Juliene SAUNDERS, DO as Consulting Physician (Neurology) Patient Instructions  Good to see you today. Make sure bp average is below 140/90 and  best 120/80 range . Continue lifestyle intervention healthy eating and activity as tolerated .   Track intacke for a few weeks to help with dietary optimization for the cholesterol levels  Continued avoiding processed foods .   Plant based eating may be helpful .  Plan fasting lab ( meds ok)  in 3-4 months (  flp and A1c ) and then fu  virtual or  in person to review .  If needed can get a second opinion about the HS help.   Christon Parada K. Paw Karstens M.D.

## 2023-12-26 NOTE — Progress Notes (Signed)
 Will review at upcoming visit . Cholesterol panel is  less favorable than last  check  but blood sugar is good.

## 2023-12-27 ENCOUNTER — Encounter: Payer: BC Managed Care – PPO | Admitting: Internal Medicine

## 2023-12-28 ENCOUNTER — Other Ambulatory Visit: Payer: Self-pay | Admitting: Internal Medicine

## 2023-12-28 ENCOUNTER — Encounter: Payer: Self-pay | Admitting: Internal Medicine

## 2023-12-28 ENCOUNTER — Ambulatory Visit (INDEPENDENT_AMBULATORY_CARE_PROVIDER_SITE_OTHER): Payer: BC Managed Care – PPO | Admitting: Internal Medicine

## 2023-12-28 VITALS — BP 128/86 | HR 81 | Temp 97.9°F | Ht 62.0 in | Wt 232.4 lb

## 2023-12-28 DIAGNOSIS — E782 Mixed hyperlipidemia: Secondary | ICD-10-CM

## 2023-12-28 DIAGNOSIS — L732 Hidradenitis suppurativa: Secondary | ICD-10-CM

## 2023-12-28 DIAGNOSIS — Z79899 Other long term (current) drug therapy: Secondary | ICD-10-CM | POA: Diagnosis not present

## 2023-12-28 DIAGNOSIS — E785 Hyperlipidemia, unspecified: Secondary | ICD-10-CM

## 2023-12-28 DIAGNOSIS — Z0000 Encounter for general adult medical examination without abnormal findings: Secondary | ICD-10-CM | POA: Diagnosis not present

## 2023-12-28 DIAGNOSIS — Z8673 Personal history of transient ischemic attack (TIA), and cerebral infarction without residual deficits: Secondary | ICD-10-CM

## 2023-12-28 DIAGNOSIS — Z23 Encounter for immunization: Secondary | ICD-10-CM | POA: Diagnosis not present

## 2023-12-28 DIAGNOSIS — I69351 Hemiplegia and hemiparesis following cerebral infarction affecting right dominant side: Secondary | ICD-10-CM

## 2023-12-28 NOTE — Patient Instructions (Addendum)
 Good to see you today. Make sure bp average is below 140/90 and  best 120/80 range . Continue lifestyle intervention healthy eating and activity as tolerated .   Track intacke for a few weeks to help with dietary optimization for the cholesterol levels  Continued avoiding processed foods .   Plant based eating may be helpful .  Plan fasting lab ( meds ok)  in 3-4 months (  flp and A1c ) and then fu  virtual or  in person to review .  If needed can get a second opinion about the HS help.

## 2024-01-11 DIAGNOSIS — Z6841 Body Mass Index (BMI) 40.0 and over, adult: Secondary | ICD-10-CM | POA: Diagnosis not present

## 2024-01-11 DIAGNOSIS — Z01419 Encounter for gynecological examination (general) (routine) without abnormal findings: Secondary | ICD-10-CM | POA: Diagnosis not present

## 2024-01-28 DIAGNOSIS — G4733 Obstructive sleep apnea (adult) (pediatric): Secondary | ICD-10-CM | POA: Diagnosis not present

## 2024-02-02 ENCOUNTER — Other Ambulatory Visit: Payer: Self-pay | Admitting: Internal Medicine

## 2024-02-16 NOTE — Progress Notes (Unsigned)
 NEUROLOGY FOLLOW UP OFFICE NOTE  Tina Mckee [id number]  Assessment/Plan:   Right sided athetoid movements secondary to perinatal stroke        Amantadine 200mg  twice daily.  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 ***.  Uses a leg brace and now more steady when ambulating.    However,  HISTORY: She has congenital mild right upper extremity weakness and spasticity secondary to perinatal stroke.  For several years, she will fall.  It will occur every 3 months or so.  She says her right leg will just give out.  She does report some right knee pain.  Since around 2013, she has had arrhythmic movements of her right upper extremity and right foot.  She was evaluated by neurology for these movements at that time.  At that time, she also exhibited jerking in the left hand.  MRI of brain on 06/02/2012 personally reviewed revealed old left MCA stroke but no new findings.  She had a routine EEG that revealed intermittent arrhythmic theta and delta slowing in the left central parietal region that appeared to correlate with right arm and leg jerking.  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  She was started on Keppra but discontinued due to dizziness and was instead started on clonazepam.  She was lost to follow up.    She continues to have involuntary right sided movements.  It occurs daily throughout the day, at rest or while in bed.  She says the falls are not associated with involuntary movement of the right lower extremity.   She reported difficulty in the left hand.  It may shake at times, causing her to drop objects and sometimes "falls asleep"  falls asleep on left side.  Usually arm not in hand  .  NCV-EMG on 02/18/2023 demonstrated evidence of mild left median neuropathy at or distal to the wrist.  She was referred to PT. Much better.     06/02/2012 EEG:  This routine EEG done with the patient awake is abnormal.  Intermittent arrhythmic theta and delta range slowing were seen in particular in the left central parietal regions.  Sometimes,these seemed to correlate with right arm jerking.  Left arm jerks which were much less frequent, did not seem to have a clear correlate.  However, it is felt that these jerking episodes are likely epileptic in nature. 06/02/2012 MRI BRAIN WO:  1. No acute intracranial abnormality.  2.  Remote left MCA infarct with associated Wallerian degeneration. No chronic blood products.   02/20/2016 MRI BRAIN WO:  1. Unchanged appearance of the brain without acute abnormality or mass.  2. Chronic left MCA infarct. 04/07/2023 EEG normal.   Past medications:  Lamotrigine  PAST MEDICAL HISTORY: Past Medical History:  Diagnosis Date   Asthma    Gestational hypertension    Perinatal arterial ischemic stroke (HCC)    Scoliosis    Sleep apnea    Stroke [facility]) birth   rt hemiparesis  Perinatal   Supervision of other normal pregnancy 05/04/2014    MEDICATIONS: Current Outpatient Medications on File Prior to Visit  Medication Sig Dispense Refill   amantadine (SYMMETREL) 100 MG capsule Take 1 capsule in morning and 2 capsules at night for  one week, then increase to 2 capsules twice daily 120 capsule 5   amLODipine (NORVASC) 5 MG tablet TAKE 1 TABLET(5 MG) BY MOUTH DAILY 90 tablet 1   citalopram (CELEXA) 40 MG tablet TAKE 1 TABLET(40 MG) BY MOUTH DAILY 30 tablet 3   fluticasone (FLONASE) 50 MCG/ACT nasal spray SHAKE LIQUID AND USE 1 SPRAY IN EACH NOSTRIL TWICE DAILY 16 g 0   ketoconazole (NIZORAL) 2 % shampoo APPLY TO DAMP SCALP, LATHER AND LET SIT FOR 5 MINUTES THEN RINSE THREE TIMES WEEKLY 120 mL 3   levonorgestrel (MIRENA, 52 MG,) 20 MCG/DAY IUD Provided by [facility]     Multiple Vitamins-Calcium (ONE-A-DAY WOMENS PO) Take 1 tablet by mouth daily.     PROAIR HFA 108 (90 Base) MCG/ACT inhaler INHALE 1-2 PUFFS INTO THE LUNGS EVERY 6 (SIX) HOURS AS NEEDED FOR WHEEZING OR SHORTNESS OF BREATH. (Patient taking differently: Inhale 1-2 puffs into the lungs every 6 (six) hours as needed for wheezing or shortness of breath.) 8.5 Inhaler 0   VITAMIN E PO Take by mouth.     No current facility-administered medications on file prior to visit.    ALLERGIES: Allergies  Allergen Reactions   Cephalexin Rash and Itching   Vibramycin [Doxycycline] Itching and Rash    FAMILY HISTORY: Family History  Problem Relation Age of Onset   Hypertension Mother    Asthma Mother    Hypertension Father    Sleep apnea Father    Asthma Sister    Cancer Maternal Grandfather        pancreatic   Cancer Paternal Grandmother        cancer all over   Other Paternal Grandfather        brain damage   Other Maternal Grandmother        heart problems      Objective:  *** General: No acute distress.  Patient appears well-groomed.   Head:  Normocephalic/atraumatic Eyes:  Fundi examined but not visualized Neck: supple, no paraspinal tenderness, full range of motion Heart:  Regular rate and rhythm Neurological Exam: Alert and oriented.  Speech fluent and not dysarthric.  Language intact.  Reduced right V1-V3.  Otherwise, CN II-XII intact.  Slightly increased tone in right upper extremity.  Occasional athetoid movement of right foot.  Muscle strength 5-/5 right upper extremity except 5/5 intrinsic hand muscles.  Otherwise, muscle strength 5/5.  Sensation to light touch reduced in right upper extremity.  Deep tendon reflexes 2+ throughout.  Finger to nose testing intact.  Slight right hemiparetic gait.  Romberg negative.   Shon Millet, DO  CC: Berniece Andreas, MD

## 2024-02-17 ENCOUNTER — Encounter: Payer: Self-pay | Admitting: Neurology

## 2024-02-17 ENCOUNTER — Ambulatory Visit: Payer: BC Managed Care – PPO | Admitting: Neurology

## 2024-02-17 VITALS — BP 136/90 | HR 89 | Ht 61.0 in | Wt 233.0 lb

## 2024-02-17 DIAGNOSIS — R258 Other abnormal involuntary movements: Secondary | ICD-10-CM

## 2024-02-17 MED ORDER — AMANTADINE HCL 100 MG PO CAPS: 200.0000 mg | ORAL_CAPSULE | Freq: Two times a day (BID) | ORAL | 5 refills | Status: DC

## 2024-02-17 NOTE — Patient Instructions (Signed)
 Amantadine 200mg  twice daily

## 2024-03-11 ENCOUNTER — Other Ambulatory Visit: Payer: Self-pay | Admitting: Neurology

## 2024-04-19 ENCOUNTER — Encounter (HOSPITAL_COMMUNITY): Payer: Self-pay

## 2024-04-25 NOTE — Progress Notes (Unsigned)
 No chief complaint on file.   HPI: Tina Mckee 33 y.o. come in for Chronic disease management   3mos  fu  bp  To get flp and  A1c  not done? ROS: See pertinent positives and negatives per HPI.  Past Medical History:  Diagnosis Date   Asthma    Gestational hypertension    Perinatal arterial ischemic stroke (HCC)    Scoliosis    Sleep apnea    Stroke [facility]) birth   rt hemiparesis  Perinatal   Supervision of other normal pregnancy 05/04/2014    Family History  Problem Relation Age of Onset   Hypertension Mother    Asthma Mother    Hypertension Father    Sleep apnea Father    Asthma Sister    Cancer Maternal Grandfather        pancreatic   Cancer Paternal Grandmother        cancer all over   Other Paternal Grandfather        brain damage   Other Maternal Grandmother        heart problems    Social History   Socioeconomic History   Marital status: Married    Spouse name: Not on file   Number of children: 2   Years of education: Not on file   Highest education level: Some college, no degree  Occupational History   Occupation: Clinical biochemist  Tobacco Use   Smoking status: Former    Current packs/day: 0.00    Types: Cigarettes    Quit date: 07/11/2007    Years since quitting: 16.8   Smokeless tobacco: Never  Vaping Use   Vaping status: Never Used  Substance and Sexual Activity   Alcohol use: Yes    Alcohol/week: 1.0 standard drink of alcohol    Types: 1 Standard drinks or equivalent per week    Comment: once a month   Drug use: No   Sexual activity: Yes    Birth control/protection: I.U.D.  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     Currently nonsmoking    HH of 4 no pets     had mild preeclampsia with vaginal childbirth.  2011   cb 12 15 preeclampsi no complicatinos   ocass etoh.    Exercise  Walking     Sleep ok    caffiene  limited per day .             Social Drivers of Corporate investment banker Strain: Low Risk  (04/23/2024)   Overall Financial Resource Strain (CARDIA)    Difficulty of Paying Living Expenses: Not hard at all  Food Insecurity: No Food Insecurity (04/23/2024)   Hunger Vital Sign    Worried About Running Out of Food in the Last Year: Never true    Ran Out of Food in the Last Year: Never true  Transportation Needs: No Transportation Needs (04/23/2024)   PRAPARE - Administrator, Civil Service (Medical): No    Lack of Transportation (Non-Medical): No  Physical Activity: Insufficiently Active (04/23/2024)   Exercise Vital Sign    Days of Exercise per Week: 1 day    Minutes of Exercise per Session: 30 min  Stress: Stress Concern Present (04/23/2024)   [employer] of Occupational Health - Occupational Stress Questionnaire    Feeling  of Stress : To some extent  Social Connections: Moderately Integrated (04/23/2024)   Social Connection and Isolation Panel [NHANES]    Frequency of Communication with Friends and Family: More than three times a week    Frequency of Social Gatherings with Friends and Family: More than three times a week    Attends Religious Services: Never    Database administrator or Organizations: Yes    Attends Engineer, structural: More than 4 times per year    Marital Status: Married    Outpatient Medications Prior to Visit  Medication Sig Dispense Refill   amantadine  (SYMMETREL ) 100 MG capsule Take 2 capsules (200 mg total) by mouth 2 (two) times daily. 120 capsule 5   amLODipine  (NORVASC ) 5 MG tablet TAKE 1 TABLET(5 MG) BY MOUTH DAILY 90 tablet 1   citalopram  (CELEXA ) 40 MG tablet TAKE 1 TABLET(40 MG) BY MOUTH DAILY 30 tablet 3   fluticasone  (FLONASE ) 50 MCG/ACT nasal spray SHAKE LIQUID AND USE 1 SPRAY IN EACH NOSTRIL TWICE DAILY 16 g 0   ketoconazole  (NIZORAL ) 2 % shampoo APPLY TO DAMP SCALP, LATHER AND LET SIT FOR 5 MINUTES THEN RINSE THREE  TIMES WEEKLY 120 mL 3   levonorgestrel  (MIRENA , 52 MG,) 20 MCG/DAY IUD Provided by [facility]     Multiple Vitamins-Calcium  (ONE-A-DAY WOMENS PO) Take 1 tablet by mouth daily.     PROAIR  HFA 108 (90 Base) MCG/ACT inhaler INHALE 1-2 PUFFS INTO THE LUNGS EVERY 6 (SIX) HOURS AS NEEDED FOR WHEEZING OR SHORTNESS OF BREATH. (Patient taking differently: Inhale 1-2 puffs into the lungs every 6 (six) hours as needed for wheezing or shortness of breath.) 8.5 Inhaler 0   VITAMIN E PO Take by mouth.     No facility-administered medications prior to visit.     EXAM:  There were no vitals taken for this visit.  There is no height or weight on file to calculate BMI. BP Readings from Last 3 Encounters:  02/17/24 (!) 136/90  12/28/23 128/86  11/24/23 126/80   Wt Readings from Last 3 Encounters:  02/17/24 233 lb (105.7 kg)  12/28/23 232 lb 6.4 oz (105.4 kg)  11/24/23 230 lb (104.3 kg)     GENERAL: vitals reviewed and listed above, alert, oriented, appears well hydrated and in no acute distress HEENT: atraumatic, conjunctiva  clear, no obvious abnormalities on inspection of external nose and ears OP : no lesion edema or exudate  NECK: no obvious masses on inspection palpation  LUNGS: clear to auscultation bilaterally, no wheezes, rales or rhonchi, good air movement CV: HRRR, no clubbing cyanosis or  peripheral edema nl cap refill  MS: moves all extremities without noticeable focal  abnormality PSYCH: pleasant and cooperative, no obvious depression or anxiety Lab Results  Component Value Date   WBC 7.9 12/22/2023   HGB 15.1 (H) 12/22/2023   HCT 44.4 12/22/2023   PLT 388.0 12/22/2023   GLUCOSE 97 12/22/2023   CHOL 209 (H) 12/22/2023   TRIG 244.0 (H) 12/22/2023   HDL 37.50 (L) 12/22/2023   LDLDIRECT 161.0 12/16/2021   LDLCALC 123 (H) 12/22/2023   ALT 11 12/22/2023   AST 10 12/22/2023   NA 139 12/22/2023   K 4.3 12/22/2023   CL 104 12/22/2023   CREATININE 0.50 12/22/2023   BUN 15  12/22/2023   CO2 27 12/22/2023   TSH 1.91 12/22/2023   HGBA1C 5.3 12/22/2023   MICROALBUR 2.6 (H) 12/22/2023   BP Readings from Last 3 Encounters:  02/17/24 Aaron Aas)  136/90  12/28/23 128/86  11/24/23 126/80    ASSESSMENT AND PLAN:  Discussed the following assessment and plan:  No diagnosis found.  -Patient advised to return or notify health care team  if  new concerns arise.  There are no Patient Instructions on file for this visit.   Caydon Feasel K. Lizbet Cirrincione M.D.

## 2024-04-26 ENCOUNTER — Encounter: Payer: Self-pay | Admitting: Internal Medicine

## 2024-04-26 ENCOUNTER — Ambulatory Visit: Payer: BC Managed Care – PPO | Admitting: Internal Medicine

## 2024-04-26 VITALS — BP 124/88 | HR 84 | Temp 98.0°F | Ht 61.0 in | Wt 232.8 lb

## 2024-04-26 DIAGNOSIS — G4733 Obstructive sleep apnea (adult) (pediatric): Secondary | ICD-10-CM | POA: Diagnosis not present

## 2024-04-26 DIAGNOSIS — Z79899 Other long term (current) drug therapy: Secondary | ICD-10-CM

## 2024-04-26 DIAGNOSIS — E785 Hyperlipidemia, unspecified: Secondary | ICD-10-CM

## 2024-04-26 DIAGNOSIS — Z6841 Body Mass Index (BMI) 40.0 and over, adult: Secondary | ICD-10-CM

## 2024-04-26 DIAGNOSIS — Z8673 Personal history of transient ischemic attack (TIA), and cerebral infarction without residual deficits: Secondary | ICD-10-CM

## 2024-04-26 DIAGNOSIS — G4719 Other hypersomnia: Secondary | ICD-10-CM | POA: Diagnosis not present

## 2024-04-26 DIAGNOSIS — G8191 Hemiplegia, unspecified affecting right dominant side: Secondary | ICD-10-CM

## 2024-04-26 LAB — CBC WITH DIFFERENTIAL/PLATELET
Basophils Absolute: 0.1 10*3/uL (ref 0.0–0.1)
Basophils Relative: 0.6 % (ref 0.0–3.0)
Eosinophils Absolute: 0.3 10*3/uL (ref 0.0–0.7)
Eosinophils Relative: 3.5 % (ref 0.0–5.0)
HCT: 45.2 % (ref 36.0–46.0)
Hemoglobin: 15.6 g/dL — ABNORMAL HIGH (ref 12.0–15.0)
Lymphocytes Relative: 24.5 % (ref 12.0–46.0)
Lymphs Abs: 2.4 10*3/uL (ref 0.7–4.0)
MCHC: 34.6 g/dL (ref 30.0–36.0)
MCV: 88.8 fl (ref 78.0–100.0)
Monocytes Absolute: 0.5 10*3/uL (ref 0.1–1.0)
Monocytes Relative: 5 % (ref 3.0–12.0)
Neutro Abs: 6.5 10*3/uL (ref 1.4–7.7)
Neutrophils Relative %: 66.4 % (ref 43.0–77.0)
Platelets: 463 10*3/uL — ABNORMAL HIGH (ref 150.0–400.0)
RBC: 5.1 Mil/uL (ref 3.87–5.11)
RDW: 12.5 % (ref 11.5–15.5)
WBC: 9.8 10*3/uL (ref 4.0–10.5)

## 2024-04-26 LAB — LIPID PANEL
Cholesterol: 213 mg/dL — ABNORMAL HIGH (ref 0–200)
HDL: 43.9 mg/dL (ref >39.00)
LDL Cholesterol: 135 mg/dL — ABNORMAL HIGH (ref 0–99)
NonHDL: 168.94
Total CHOL/HDL Ratio: 5
Triglycerides: 168 mg/dL — ABNORMAL HIGH (ref 0.0–149.0)
VLDL: 33.6 mg/dL (ref 0.0–40.0)

## 2024-04-26 LAB — BASIC METABOLIC PANEL WITH GFR
BUN: 13 mg/dL (ref 6–23)
CO2: 27 meq/L (ref 19–32)
Calcium: 9.5 mg/dL (ref 8.4–10.5)
Chloride: 101 meq/L (ref 96–112)
Creatinine, Ser: 0.61 mg/dL (ref 0.40–1.20)
GFR: 116.14 mL/min (ref >60.00)
Glucose, Bld: 93 mg/dL (ref 70–99)
Potassium: 4.1 meq/L (ref 3.5–5.1)
Sodium: 137 meq/L (ref 135–145)

## 2024-04-26 LAB — HEMOGLOBIN A1C: Hgb A1c MFr Bld: 5.3 % (ref 4.6–6.5)

## 2024-04-26 LAB — HEPATIC FUNCTION PANEL
ALT: 16 U/L (ref 0–35)
AST: 15 U/L (ref 0–37)
Albumin: 4.7 g/dL (ref 3.5–5.2)
Alkaline Phosphatase: 77 U/L (ref 39–117)
Bilirubin, Direct: 0.2 mg/dL (ref 0.0–0.3)
Total Bilirubin: 1.6 mg/dL — ABNORMAL HIGH (ref 0.2–1.2)
Total Protein: 7.2 g/dL (ref 6.0–8.3)

## 2024-04-26 LAB — T4, FREE: Free T4: 0.67 ng/dL (ref 0.60–1.60)

## 2024-04-26 LAB — POCT GLYCOSYLATED HEMOGLOBIN (HGB A1C): Hemoglobin A1C: 5.2 % (ref 4.0–5.6)

## 2024-04-26 LAB — TSH: TSH: 1.78 u[IU]/mL (ref 0.35–5.50)

## 2024-04-26 NOTE — Patient Instructions (Signed)
 Update labs today to r/o metabolic causes of  tiredness Want to get your cpap OSA  reviewed as your day time sleepiness  is much increased .  Consider ing glp1 in future  like wegovy . Could counsel help  also .

## 2024-05-09 DIAGNOSIS — G4733 Obstructive sleep apnea (adult) (pediatric): Secondary | ICD-10-CM | POA: Diagnosis not present

## 2024-05-10 ENCOUNTER — Ambulatory Visit: Payer: Self-pay | Admitting: Internal Medicine

## 2024-05-10 NOTE — Progress Notes (Signed)
 Cholesterol improved but still not optimum  Liver kidney thyroid   and A1c are in range   Continue Intensify lifestyle interventions.  And will see you at next appt

## 2024-05-22 DIAGNOSIS — L218 Other seborrheic dermatitis: Secondary | ICD-10-CM | POA: Diagnosis not present

## 2024-05-22 DIAGNOSIS — L538 Other specified erythematous conditions: Secondary | ICD-10-CM | POA: Diagnosis not present

## 2024-05-22 DIAGNOSIS — D492 Neoplasm of unspecified behavior of bone, soft tissue, and skin: Secondary | ICD-10-CM | POA: Diagnosis not present

## 2024-05-22 DIAGNOSIS — D225 Melanocytic nevi of trunk: Secondary | ICD-10-CM | POA: Diagnosis not present

## 2024-05-22 DIAGNOSIS — L821 Other seborrheic keratosis: Secondary | ICD-10-CM | POA: Diagnosis not present

## 2024-05-22 DIAGNOSIS — L814 Other melanin hyperpigmentation: Secondary | ICD-10-CM | POA: Diagnosis not present

## 2024-05-25 ENCOUNTER — Other Ambulatory Visit: Payer: Self-pay | Admitting: Internal Medicine

## 2024-06-08 ENCOUNTER — Other Ambulatory Visit: Payer: Self-pay | Admitting: Family

## 2024-06-20 ENCOUNTER — Other Ambulatory Visit: Payer: Self-pay

## 2024-06-20 MED ORDER — CITALOPRAM HYDROBROMIDE 40 MG PO TABS: ORAL_TABLET | ORAL | 3 refills | Status: DC

## 2024-06-26 ENCOUNTER — Ambulatory Visit: Admitting: Family Medicine

## 2024-06-27 ENCOUNTER — Encounter: Payer: Self-pay | Admitting: Internal Medicine

## 2024-06-27 ENCOUNTER — Ambulatory Visit: Admitting: Internal Medicine

## 2024-06-27 VITALS — BP 134/95 | HR 79 | Temp 98.1°F | Wt 238.2 lb

## 2024-06-27 DIAGNOSIS — I10 Essential (primary) hypertension: Secondary | ICD-10-CM | POA: Diagnosis not present

## 2024-06-27 DIAGNOSIS — Z8673 Personal history of transient ischemic attack (TIA), and cerebral infarction without residual deficits: Secondary | ICD-10-CM

## 2024-06-27 DIAGNOSIS — G4733 Obstructive sleep apnea (adult) (pediatric): Secondary | ICD-10-CM

## 2024-06-27 DIAGNOSIS — E785 Hyperlipidemia, unspecified: Secondary | ICD-10-CM

## 2024-06-27 DIAGNOSIS — Z79899 Other long term (current) drug therapy: Secondary | ICD-10-CM

## 2024-06-27 MED ORDER — ZEPBOUND 2.5 MG/0.5ML ~~LOC~~ SOAJ: 2.5000 mg | SUBCUTANEOUS | 1 refills | Status: DC

## 2024-06-27 NOTE — Patient Instructions (Signed)
 Lets try zep bound first  If not approved we can try wegovy   Continue l attention lifestyle intervention healthy eating and exercise .   Plan  send in message  about how doing after 1 month of  initial dosing and we can increase  to the next dose   up.  Afterward plan  fu after 2 months on next dose.

## 2024-06-27 NOTE — Progress Notes (Unsigned)
 Chief Complaint  Patient presents with   Hypertension    Pt is fasting   Hyperlipidemia    HPI: Tina Mckee 33 y.o. come in for Chronic disease management   Sleep a bit better   No  BP 123/85 at home and up in office  Volunteering   pto sding better soxiL    New chammpoo  ROS: See pertinent positives and negatives per HPI.  Past Medical History:  Diagnosis Date   Asthma    Gestational hypertension    Perinatal arterial ischemic stroke (HCC)    Scoliosis    Sleep apnea    Stroke [facility]) birth   rt hemiparesis  Perinatal   Supervision of other normal pregnancy 05/04/2014    Family History  Problem Relation Age of Onset   Hypertension Mother    Asthma Mother    Hypertension Father    Sleep apnea Father    Asthma Sister    Cancer Maternal Grandfather        pancreatic   Cancer Paternal Grandmother        cancer all over   Other Paternal Grandfather        brain damage   Other Maternal Grandmother        heart problems    Social History   Socioeconomic History   Marital status: Married    Spouse name: Not on file   Number of children: 2   Years of education: Not on file   Highest education level: Some college, no degree  Occupational History   Occupation: Clinical biochemist  Tobacco Use   Smoking status: Former    Current packs/day: 0.00    Types: Cigarettes    Quit date: 07/11/2007    Years since quitting: 16.9   Smokeless tobacco: Never  Vaping Use   Vaping status: Never Used  Substance and Sexual Activity   Alcohol use: Yes    Alcohol/week: 1.0 standard drink of alcohol    Types: 1 Standard drinks or equivalent per week    Comment: once a month   Drug use: No   Sexual activity: Yes    Birth control/protection: I.U.D.  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     Currently nonsmoking    HH of 4 no pets     had mild preeclampsia  with vaginal childbirth.  2011   cb 12 15 preeclampsi no complicatinos   ocass etoh.    Exercise  Walking     Sleep ok    caffiene limited per day .             Social Drivers of Corporate investment banker Strain: Low Risk  (06/26/2024)   Overall Financial Resource Strain (CARDIA)    Difficulty of Paying Living Expenses: Not hard at all  Food Insecurity: No Food Insecurity (06/26/2024)   Hunger Vital Sign    Worried About Running Out of Food in the Last Year: Never true    Ran Out of Food in the Last Year: Never true  Transportation Needs: No Transportation Needs (06/26/2024)   PRAPARE - Administrator, Civil Service (Medical): No    Lack of Transportation (Non-Medical): No  Physical Activity: Insufficiently Active (06/26/2024)   Exercise Vital Sign    Days of Exercise per Week: 1 day  Minutes of Exercise per Session: 10 min  Stress: Stress Concern Present (06/26/2024)   [employer] of Occupational Health - Occupational Stress Questionnaire    Feeling of Stress: To some extent  Social Connections: Moderately Integrated (06/26/2024)   Social Connection and Isolation Panel    Frequency of Communication with Friends and Family: More than three times a week    Frequency of Social Gatherings with Friends and Family: Three times a week    Attends Religious Services: Never    Active Member of Clubs or Organizations: Yes    Attends Engineer, structural: More than 4 times per year    Marital Status: Married    Outpatient Medications Prior to Visit  Medication Sig Dispense Refill   amantadine  (SYMMETREL ) 100 MG capsule Take 2 capsules (200 mg total) by mouth 2 (two) times daily. 120 capsule 5   amLODipine  (NORVASC ) 5 MG tablet TAKE 1 TABLET(5 MG) BY MOUTH DAILY 90 tablet 1   betamethasone , augmented, (DIPROLENE ) 0.05 % lotion Apply topically.     Ciclopirox 1 % shampoo Apply topically.     citalopram  (CELEXA ) 40 MG tablet TAKE 1 TABLET(40 MG) BY MOUTH DAILY  30 tablet 3   fluticasone  (FLONASE ) 50 MCG/ACT nasal spray SHAKE LIQUID AND USE 1 SPRAY IN EACH NOSTRIL TWICE DAILY 16 g 0   levonorgestrel  (MIRENA , 52 MG,) 20 MCG/DAY IUD Provided by [facility]     Multiple Vitamins-Calcium  (ONE-A-DAY WOMENS PO) Take 1 tablet by mouth daily.     PROAIR  HFA 108 (90 Base) MCG/ACT inhaler INHALE 1-2 PUFFS INTO THE LUNGS EVERY 6 (SIX) HOURS AS NEEDED FOR WHEEZING OR SHORTNESS OF BREATH. (Patient taking differently: Inhale 1-2 puffs into the lungs every 6 (six) hours as needed for wheezing or shortness of breath.) 8.5 Inhaler 0   VITAMIN E PO Take by mouth.     ketoconazole  (NIZORAL ) 2 % shampoo APPLY TO DAMP SCALP, LATHER AND LET SIT FOR 5 MINUTES THEN RINSE THREE TIMES WEEKLY 120 mL 3   No facility-administered medications prior to visit.     EXAM:  BP (!) 134/95   Pulse 79   Temp 98.1 F (36.7 C)   Wt 238 lb 3.2 oz (108 kg)   SpO2 94%   BMI 45.01 kg/m   Body mass index is 45.01 kg/m. Last Weight  Most recent update: 06/27/2024  1:11 PM    Weight  108 kg (238 lb 3.2 oz)            Wt Readings from Last 3 Encounters:  06/27/24 238 lb 3.2 oz (108 kg)  04/26/24 232 lb 12.8 oz (105.6 kg)  02/17/24 233 lb (105.7 kg)     GENERAL: vitals reviewed and listed above, alert, oriented, appears well hydrated and in no acute distress HEENT: atraumatic, conjunctiva  clear, no obvious abnormalities on inspection of external nose and ears OP : no lesion edema or exudate  NECK: no obvious masses on inspection palpation  LUNGS: clear to auscultation bilaterally, no wheezes, rales or rhonchi, good air movement CV: HRRR, no clubbing cyanosis or  peripheral edema nl cap refill  MS: moves all extremities without noticeable focal  abnormality PSYCH: pleasant and cooperative, no obvious depression or anxiety Lab Results  Component Value Date   WBC 9.8 04/26/2024   HGB 15.6 (H) 04/26/2024   HCT 45.2 04/26/2024   PLT 463.0 (H) 04/26/2024   GLUCOSE 93  04/26/2024   CHOL 213 (H) 04/26/2024   TRIG 168.0 (H) 04/26/2024  HDL 43.90 04/26/2024   LDLDIRECT 161.0 12/16/2021   LDLCALC 135 (H) 04/26/2024   ALT 16 04/26/2024   AST 15 04/26/2024   NA 137 04/26/2024   K 4.1 04/26/2024   CL 101 04/26/2024   CREATININE 0.61 04/26/2024   BUN 13 04/26/2024   CO2 27 04/26/2024   TSH 1.78 04/26/2024   HGBA1C 5.3 04/26/2024   BP Readings from Last 3 Encounters:  06/27/24 (!) 134/95  04/26/24 124/88  02/17/24 (!) 136/90    ASSESSMENT AND PLAN:  Discussed the following assessment and plan:  Diastolic hypertension  Morbid obesity (HCC)  Medication management  OSA on CPAP  Dyslipidemia (high LDL; low HDL)  History of stroke - neonatal Feels bp much better at home  usually  Osa mask  adjustment has helped osa sx  and energy level  Family stress but coping  Agree she is a good candidate for glp1 /gip meds  with comorbidity osa ht hx of cva  etc and HLD  No Ci by hx  Send in zepbound  if not covered afer pa will try wegovy since a good candidate  and go from there  fu 2-3 mos after  beginning  And contact us  for  new dosing after first month  -Patient advised to return or notify health care team  if  new concerns arise.  Patient Instructions  Lets try zep bound first  If not approved we can try wegovy   Continue l attention lifestyle intervention healthy eating and exercise .   Plan  send in message  about how doing after 1 month of  initial dosing and we can increase  to the next dose   up.  Afterward plan  fu after 2 months on next dose.   Yolonda Purtle K. Shoua Ressler M.D.

## 2024-06-28 ENCOUNTER — Other Ambulatory Visit (HOSPITAL_COMMUNITY): Payer: Self-pay

## 2024-06-28 ENCOUNTER — Telehealth: Payer: Self-pay

## 2024-06-28 ENCOUNTER — Encounter: Payer: Self-pay | Admitting: Internal Medicine

## 2024-06-28 NOTE — Telephone Encounter (Signed)
 Pharmacy Patient Advocate Encounter   Received notification from CoverMyMeds that prior authorization for Zepbound  2.5MG /0.5ML pen-injectors  is required/requested.   Insurance verification completed.   The patient is insured through [facility] .   Per test claim: PA required; PA started via CoverMyMeds. KEY [id number] . Waiting for clinical questions to populate.

## 2024-06-29 ENCOUNTER — Other Ambulatory Visit (HOSPITAL_COMMUNITY): Payer: Self-pay

## 2024-06-29 ENCOUNTER — Encounter: Payer: Self-pay | Admitting: Internal Medicine

## 2024-06-29 NOTE — Telephone Encounter (Signed)
 PLEASE BE ADVISED Clinical questions have been answered and PA submitted.TO PLAN. PA currently Pending.

## 2024-06-29 NOTE — Telephone Encounter (Signed)
Keep us updated

## 2024-06-29 NOTE — Telephone Encounter (Signed)
 Pharmacy Patient Advocate Encounter  Received notification from [facility] that Prior Authorization for Zepbound  2.5MG /0.5ML pen-injectors has been APPROVED from 06/29/24 to 12/26/24   PA #/Case ID/Reference #: [id number]

## 2024-06-30 NOTE — Telephone Encounter (Signed)
 Update pt that Zepbound  is approved. Please see phone encounter.

## 2024-06-30 NOTE — Telephone Encounter (Signed)
 Reach out to pt's pharmacy and spoke to [place], Associate Professor. Update her of the approval. She states they have to order the Zepbound  Rx and would arrive on Monday.   Contact pt. Patient is aware. Advise pt to follow up with her pharmacy on Monday. Pt is verbalized understanding.

## 2024-06-30 NOTE — Telephone Encounter (Signed)
 Please see today phone encounter.

## 2024-07-01 ENCOUNTER — Encounter (HOSPITAL_BASED_OUTPATIENT_CLINIC_OR_DEPARTMENT_OTHER): Payer: Self-pay | Admitting: Emergency Medicine

## 2024-07-01 ENCOUNTER — Other Ambulatory Visit: Payer: Self-pay

## 2024-07-01 ENCOUNTER — Emergency Department (HOSPITAL_BASED_OUTPATIENT_CLINIC_OR_DEPARTMENT_OTHER)

## 2024-07-01 ENCOUNTER — Inpatient Hospital Stay (HOSPITAL_BASED_OUTPATIENT_CLINIC_OR_DEPARTMENT_OTHER)
Admission: EM | Admit: 2024-07-01 | Discharge: 2024-07-03 | DRG: 418 | Disposition: A | Discharge status: Home / Self Care | Attending: Internal Medicine | Admitting: Internal Medicine

## 2024-07-01 DIAGNOSIS — K8063 Calculus of gallbladder and bile duct with acute cholecystitis with obstruction: Principal | ICD-10-CM

## 2024-07-01 DIAGNOSIS — E66813 Obesity, class 3: Secondary | ICD-10-CM | POA: Diagnosis not present

## 2024-07-01 DIAGNOSIS — R739 Hyperglycemia, unspecified: Secondary | ICD-10-CM | POA: Diagnosis not present

## 2024-07-01 DIAGNOSIS — Z825 Family history of asthma and other chronic lower respiratory diseases: Secondary | ICD-10-CM | POA: Diagnosis not present

## 2024-07-01 DIAGNOSIS — K8010 Calculus of gallbladder with chronic cholecystitis without obstruction: Principal | ICD-10-CM | POA: Diagnosis present

## 2024-07-01 DIAGNOSIS — Z6841 Body Mass Index (BMI) 40.0 and over, adult: Secondary | ICD-10-CM | POA: Diagnosis not present

## 2024-07-01 DIAGNOSIS — Z975 Presence of (intrauterine) contraceptive device: Secondary | ICD-10-CM | POA: Diagnosis not present

## 2024-07-01 DIAGNOSIS — Z7985 Long-term (current) use of injectable non-insulin antidiabetic drugs: Secondary | ICD-10-CM

## 2024-07-01 DIAGNOSIS — K8060 Calculus of gallbladder and bile duct with cholecystitis, unspecified, without obstruction: Secondary | ICD-10-CM | POA: Diagnosis not present

## 2024-07-01 DIAGNOSIS — I10 Essential (primary) hypertension: Secondary | ICD-10-CM | POA: Diagnosis present

## 2024-07-01 DIAGNOSIS — Z881 Allergy status to other antibiotic agents status: Secondary | ICD-10-CM

## 2024-07-01 DIAGNOSIS — I69351 Hemiplegia and hemiparesis following cerebral infarction affecting right dominant side: Secondary | ICD-10-CM | POA: Diagnosis not present

## 2024-07-01 DIAGNOSIS — K819 Cholecystitis, unspecified: Secondary | ICD-10-CM | POA: Diagnosis not present

## 2024-07-01 DIAGNOSIS — G8191 Hemiplegia, unspecified affecting right dominant side: Secondary | ICD-10-CM

## 2024-07-01 DIAGNOSIS — Z8249 Family history of ischemic heart disease and other diseases of the circulatory system: Secondary | ICD-10-CM | POA: Diagnosis not present

## 2024-07-01 DIAGNOSIS — Z8673 Personal history of transient ischemic attack (TIA), and cerebral infarction without residual deficits: Secondary | ICD-10-CM

## 2024-07-01 DIAGNOSIS — K838 Other specified diseases of biliary tract: Secondary | ICD-10-CM | POA: Diagnosis not present

## 2024-07-01 DIAGNOSIS — E781 Pure hyperglyceridemia: Secondary | ICD-10-CM | POA: Diagnosis present

## 2024-07-01 DIAGNOSIS — R251 Tremor, unspecified: Secondary | ICD-10-CM | POA: Diagnosis not present

## 2024-07-01 DIAGNOSIS — Z79899 Other long term (current) drug therapy: Secondary | ICD-10-CM | POA: Diagnosis not present

## 2024-07-01 DIAGNOSIS — Z87891 Personal history of nicotine dependence: Secondary | ICD-10-CM | POA: Diagnosis not present

## 2024-07-01 DIAGNOSIS — G4730 Sleep apnea, unspecified: Secondary | ICD-10-CM | POA: Diagnosis present

## 2024-07-01 DIAGNOSIS — J45909 Unspecified asthma, uncomplicated: Secondary | ICD-10-CM | POA: Diagnosis present

## 2024-07-01 DIAGNOSIS — M419 Scoliosis, unspecified: Secondary | ICD-10-CM | POA: Diagnosis present

## 2024-07-01 DIAGNOSIS — K8020 Calculus of gallbladder without cholecystitis without obstruction: Secondary | ICD-10-CM | POA: Diagnosis not present

## 2024-07-01 DIAGNOSIS — K8050 Calculus of bile duct without cholangitis or cholecystitis without obstruction: Secondary | ICD-10-CM

## 2024-07-01 DIAGNOSIS — E785 Hyperlipidemia, unspecified: Secondary | ICD-10-CM | POA: Diagnosis not present

## 2024-07-01 DIAGNOSIS — Z800 Family history of malignant neoplasm of digestive organs: Secondary | ICD-10-CM | POA: Diagnosis not present

## 2024-07-01 DIAGNOSIS — Z9049 Acquired absence of other specified parts of digestive tract: Secondary | ICD-10-CM | POA: Diagnosis not present

## 2024-07-01 DIAGNOSIS — R1013 Epigastric pain: Secondary | ICD-10-CM | POA: Diagnosis not present

## 2024-07-01 DIAGNOSIS — R197 Diarrhea, unspecified: Secondary | ICD-10-CM | POA: Diagnosis not present

## 2024-07-01 DIAGNOSIS — E669 Obesity, unspecified: Secondary | ICD-10-CM | POA: Diagnosis present

## 2024-07-01 LAB — URINALYSIS, ROUTINE W REFLEX MICROSCOPIC
Bilirubin Urine: NEGATIVE
Glucose, UA: NEGATIVE mg/dL
Hgb urine dipstick: NEGATIVE
Ketones, ur: NEGATIVE mg/dL
Leukocytes,Ua: NEGATIVE
Nitrite: NEGATIVE
Protein, ur: NEGATIVE mg/dL
Specific Gravity, Urine: 1.011 (ref 1.005–1.030)
pH: 6.5 (ref 5.0–8.0)

## 2024-07-01 LAB — COMPREHENSIVE METABOLIC PANEL WITH GFR
ALT: 322 U/L — ABNORMAL HIGH (ref 0–44)
AST: 394 U/L — ABNORMAL HIGH (ref 15–41)
Albumin: 4.6 g/dL (ref 3.5–5.0)
Alkaline Phosphatase: 178 U/L — ABNORMAL HIGH (ref 38–126)
Anion gap: 13 (ref 5–15)
BUN: 14 mg/dL (ref 6–20)
CO2: 24 mmol/L (ref 22–32)
Calcium: 10.8 mg/dL — ABNORMAL HIGH (ref 8.9–10.3)
Chloride: 102 mmol/L (ref 98–111)
Creatinine, Ser: 0.68 mg/dL (ref 0.44–1.00)
GFR, Estimated: >60 mL/min (ref >60)
Glucose, Bld: 146 mg/dL — ABNORMAL HIGH (ref 70–99)
Potassium: 4 mmol/L (ref 3.5–5.1)
Sodium: 138 mmol/L (ref 135–145)
Total Bilirubin: 2.1 mg/dL — ABNORMAL HIGH (ref 0.0–1.2)
Total Protein: 7.2 g/dL (ref 6.5–8.1)

## 2024-07-01 LAB — CREATININE, SERUM
Creatinine, Ser: 0.54 mg/dL (ref 0.44–1.00)
GFR, Estimated: >60 mL/min (ref >60)

## 2024-07-01 LAB — CBC
HCT: 41.1 % (ref 36.0–46.0)
HCT: 42.4 % (ref 36.0–46.0)
Hemoglobin: 14.4 g/dL (ref 12.0–15.0)
Hemoglobin: 14.8 g/dL (ref 12.0–15.0)
MCH: 29.9 pg (ref 26.0–34.0)
MCH: 30.1 pg (ref 26.0–34.0)
MCHC: 35 g/dL (ref 30.0–36.0)
MCHC: 34.9 g/dL (ref 30.0–36.0)
MCV: 85.4 fL (ref 80.0–100.0)
MCV: 86.2 fL (ref 80.0–100.0)
Platelets: 431 K/uL — ABNORMAL HIGH (ref 150–400)
Platelets: 438 K/uL — ABNORMAL HIGH (ref 150–400)
RBC: 4.81 MIL/uL (ref 3.87–5.11)
RBC: 4.92 MIL/uL (ref 3.87–5.11)
RDW: 11.7 % (ref 11.5–15.5)
RDW: 11.8 % (ref 11.5–15.5)
WBC: 8.8 K/uL (ref 4.0–10.5)
WBC: 7.7 K/uL (ref 4.0–10.5)
nRBC: 0 % (ref 0.0–0.2)
nRBC: 0 % (ref 0.0–0.2)

## 2024-07-01 LAB — HIV ANTIBODY (ROUTINE TESTING W REFLEX): HIV Screen 4th Generation wRfx: NONREACTIVE

## 2024-07-01 LAB — LIPASE, BLOOD: Lipase: 36 U/L (ref 11–51)

## 2024-07-01 LAB — TROPONIN T, HIGH SENSITIVITY: Troponin T High Sensitivity: <15 ng/L (ref <19)

## 2024-07-01 LAB — PREGNANCY, URINE: Preg Test, Ur: NEGATIVE

## 2024-07-01 MED ORDER — ONDANSETRON HCL 4 MG/2ML IJ SOLN: 4.0000 mg | Freq: Four times a day (QID) | INTRAMUSCULAR | Status: DC | PRN

## 2024-07-01 MED ORDER — ACETAMINOPHEN 325 MG PO TABS
650.0000 mg | ORAL_TABLET | Freq: Four times a day (QID) | ORAL | Status: DC | PRN
  Administered 2024-07-02: 650 mg via ORAL
  Filled 2024-07-01: qty 2

## 2024-07-01 MED ORDER — ALBUTEROL SULFATE (2.5 MG/3ML) 0.083% IN NEBU: 3.0000 mL | INHALATION_SOLUTION | Freq: Four times a day (QID) | RESPIRATORY_TRACT | Status: DC | PRN

## 2024-07-01 MED ORDER — HYDROMORPHONE HCL 1 MG/ML IJ SOLN
0.5000 mg | INTRAMUSCULAR | Status: DC | PRN
  Administered 2024-07-01 – 2024-07-02 (×3): 1 mg via INTRAVENOUS
  Filled 2024-07-01 (×3): qty 1

## 2024-07-01 MED ORDER — ACETAMINOPHEN 650 MG RE SUPP: 650.0000 mg | Freq: Four times a day (QID) | RECTAL | Status: DC | PRN

## 2024-07-01 MED ORDER — FENTANYL CITRATE PF 50 MCG/ML IJ SOSY
50.0000 ug | PREFILLED_SYRINGE | Freq: Once | INTRAMUSCULAR | Status: AC
  Administered 2024-07-01: 50 ug via INTRAVENOUS
  Filled 2024-07-01: qty 1

## 2024-07-01 MED ORDER — AMANTADINE HCL 100 MG PO CAPS
200.0000 mg | ORAL_CAPSULE | Freq: Two times a day (BID) | ORAL | Status: DC
  Administered 2024-07-01 – 2024-07-03 (×3): 200 mg via ORAL
  Filled 2024-07-01 (×5): qty 2

## 2024-07-01 MED ORDER — CITALOPRAM HYDROBROMIDE 20 MG PO TABS
40.0000 mg | ORAL_TABLET | Freq: Every day | ORAL | Status: DC
  Administered 2024-07-01 – 2024-07-03 (×2): 40 mg via ORAL
  Filled 2024-07-01 (×2): qty 2

## 2024-07-01 MED ORDER — SODIUM CHLORIDE 0.9 % IV SOLN: INTRAVENOUS | Status: AC

## 2024-07-01 MED ORDER — ENOXAPARIN SODIUM 40 MG/0.4ML IJ SOSY
40.0000 mg | PREFILLED_SYRINGE | INTRAMUSCULAR | Status: DC
  Administered 2024-07-03: 40 mg via SUBCUTANEOUS
  Filled 2024-07-01: qty 0.4

## 2024-07-01 MED ORDER — PANTOPRAZOLE SODIUM 40 MG IV SOLR
40.0000 mg | Freq: Once | INTRAVENOUS | Status: AC
  Administered 2024-07-01: 40 mg via INTRAVENOUS
  Filled 2024-07-01: qty 10

## 2024-07-01 MED ORDER — MUPIROCIN 2 % EX OINT
1.0000 | TOPICAL_OINTMENT | Freq: Two times a day (BID) | CUTANEOUS | Status: DC
  Administered 2024-07-01 – 2024-07-03 (×4): 1 via NASAL
  Filled 2024-07-01 (×2): qty 22

## 2024-07-01 MED ORDER — CIPROFLOXACIN IN D5W 400 MG/200ML IV SOLN
400.0000 mg | Freq: Once | INTRAVENOUS | Status: AC
  Administered 2024-07-01: 400 mg via INTRAVENOUS
  Filled 2024-07-01: qty 200

## 2024-07-01 MED ORDER — AMLODIPINE BESYLATE 5 MG PO TABS
5.0000 mg | ORAL_TABLET | Freq: Every day | ORAL | Status: DC
  Administered 2024-07-01 – 2024-07-03 (×3): 5 mg via ORAL
  Filled 2024-07-01 (×3): qty 1

## 2024-07-01 MED ORDER — ONDANSETRON HCL 4 MG/2ML IJ SOLN
4.0000 mg | Freq: Once | INTRAMUSCULAR | Status: AC
  Administered 2024-07-01: 4 mg via INTRAVENOUS
  Filled 2024-07-01: qty 2

## 2024-07-01 MED ORDER — FLUTICASONE PROPIONATE 50 MCG/ACT NA SUSP: 1.0000 | Freq: Two times a day (BID) | NASAL | Status: DC | PRN

## 2024-07-01 NOTE — Plan of Care (Signed)

## 2024-07-01 NOTE — Plan of Care (Signed)
 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 Amantadine  has helped the movements although she still has them sometimes. She presented to [facility] with severe abdominal pain and was found to have cholecystitis. Surgery has requested that neurology comment on any contraindications to cholecystectomy related to her perinatal stroke and its sequelae (athetoid movements). Her home medication amantadine  should be continued except when she is NPO. Athetoid movements do not appear during sleep therefore should not cause any issues during the surgery itself. Patient is cleared for surgery from neurology standpoint. Neurology will not continue to follow but please reach out if we can be of any further assistance.  Tina Ross, MD [facility] [phone]  If 7pm- 7am, please page neurology on call as listed in AMION.

## 2024-07-01 NOTE — H&P (Signed)
 History and Physical    Tina Mckee FMW:[id number] DOB: 1991-05-19 DOA: 07/01/2024  Referring MD/NP/PA: EDP PCP:  Patient coming from: [facility]  Chief Complaint: Abdominal pain  HPI: Tina Mckee is a 33/F with history of perinatal stroke, residual right hemiparesis, obesity, hypertension, dyslipidemia developed severe epigastric/right-sided abdominal pain around 1 AM this morning which woke her up, subsequently tried some Pepto-Bismol followed by Tums without any relief, symptoms persisted through the morning, also noticed some chills, eventually presented to [facility] ED Course: Hypertensive, other vital stable, labs noted platelets 431, creatinine 0.6, WBC 8, urine pregnancy negative, UA normal, abdominal ultrasound with gallstones, gallbladder sludge, gallbladder wall thickness at upper limit of normal, positive sonographic Murphy's sign, EDP consulted general surgery who recommended admission per [facility]  Review of Systems: As per HPI otherwise 14 point review of systems negative.   Past Medical History:  Diagnosis Date   Asthma    Gestational hypertension    Perinatal arterial ischemic stroke (HCC)    Scoliosis    Sleep apnea    Stroke [facility]) birth   rt hemiparesis  Perinatal   Supervision of other normal pregnancy 05/04/2014    Past Surgical History:  Procedure Laterality Date   FOOT SURGERY     rt foot   FOOT SURGERY     Bone placed   moles removed     from back     reports that she quit smoking about 16 years ago. Her smoking use included cigarettes. She has never used smokeless tobacco. She reports current alcohol use of about 1.0 standard drink of alcohol per week. She reports that she does not use drugs.  Allergies  Allergen Reactions   Cephalexin Rash and Itching   Vibramycin  [Doxycycline ] Itching and Rash    Family History  Problem Relation Age of Onset   Hypertension Mother    Asthma Mother    Hypertension Father    Sleep apnea  Father    Asthma Sister    Cancer Maternal Grandfather        pancreatic   Cancer Paternal Grandmother        cancer all over   Other Paternal Grandfather        brain damage   Other Maternal Grandmother        heart problems     Prior to Admission medications   Medication Sig Start Date End Date Taking? Authorizing Provider  amantadine  (SYMMETREL ) 100 MG capsule Take 2 capsules (200 mg total) by mouth 2 (two) times daily. 02/17/24  Yes Jaffe, Adam R, DO  amLODipine  (NORVASC ) 5 MG tablet TAKE 1 TABLET(5 MG) BY MOUTH DAILY 05/25/24  Yes Panosh, Wanda K, MD  betamethasone , augmented, (DIPROLENE ) 0.05 % lotion Apply 1 Application topically daily as needed (Irritation). 05/22/24  Yes [provider]  bismuth subsalicylate (PEPTO BISMOL) 262 MG chewable tablet Chew 524 mg by mouth as needed for indigestion.   Yes [provider]  calcium  carbonate (TUMS - DOSED IN MG ELEMENTAL CALCIUM ) 500 MG chewable tablet Chew 1 tablet by mouth as needed for indigestion or heartburn.   Yes [provider]  Ciclopirox 1 % shampoo Apply 1 Application topically 3 (three) times a week. 05/22/24  Yes [provider]  citalopram  (CELEXA ) 40 MG tablet TAKE 1 TABLET(40 MG) BY MOUTH DAILY 06/20/24  Yes Panosh, Wanda K, MD  fluticasone  (FLONASE ) 50 MCG/ACT nasal spray SHAKE LIQUID AND USE 1 SPRAY IN EACH NOSTRIL TWICE DAILY Patient taking differently: Place  1 spray into both nostrils 2 (two) times daily as needed for allergies or rhinitis. 12/23/23  Yes Webb, Padonda B, FNP  levonorgestrel  (MIRENA , 52 MG,) 20 MCG/DAY IUD [facility] 04/01/22  Yes [provider]  Multiple Vitamins-Calcium  (ONE-A-DAY WOMENS PO) Take 1 tablet by mouth daily.   Yes [provider]  PROAIR  HFA 108 (90 Base) MCG/ACT inhaler INHALE 1-2 PUFFS INTO THE LUNGS EVERY 6 (SIX) HOURS AS NEEDED FOR WHEEZING OR SHORTNESS OF BREATH. 04/06/19  Yes Panosh, Wanda K, MD  VITAMIN E PO Take 1 tablet by  mouth daily.   Yes [provider]  tirzepatide  (ZEPBOUND ) 2.5 MG/0.5ML Pen Inject 2.5 mg into the skin once a week. Patient not taking: Reported on 07/01/2024 06/27/24   Charlett Apolinar POUR, MD    Physical Exam: Vitals:   07/01/24 1200 07/01/24 1205 07/01/24 1230 07/01/24 1311  BP: (!) 142/96   (!) 139/90  Pulse: 70  78 71  Resp: 17  17 16   Temp:  97.8 F (36.6 C)  97.6 F (36.4 C)  TempSrc:  Oral  Oral  SpO2: 97%  99% 97%  Weight:      Height:          Constitutional: Obese chronically ill NAD, calm, comfortable HEENT:no JVD Respiratory: clear to auscultation bilaterally Cardiovascular: S1S2/RRR Abdomen: Obese, soft, right sided tenderness, no rigidity or rebound, bowel sounds decreased Musculoskeletal: No joint deformity upper and lower extremities. Ext: no edema Skin: no rashes Neurologic: Residual right hemiparesis with contractures involving right upper extremity Psychiatric: Normal judgment and insight. Alert and oriented x 3. Normal mood.   Labs on Admission: I have personally reviewed following labs and imaging studies  CBC: Recent Labs  Lab 07/01/24 0756  WBC 8.8  HGB 14.4  HCT 41.1  MCV 85.4  PLT 431*   Basic Metabolic Panel: Recent Labs  Lab 07/01/24 0756  NA 138  K 4.0  CL 102  CO2 24  GLUCOSE 146*  BUN 14  CREATININE 0.68  CALCIUM  10.8*   GFR: Estimated Creatinine Clearance: 110.5 mL/min (by C-G formula based on SCr of 0.68 mg/dL). Liver Function Tests: Recent Labs  Lab 07/01/24 0756  AST 394*  ALT 322*  ALKPHOS 178*  BILITOT 2.1*  PROT 7.2  ALBUMIN 4.6   Recent Labs  Lab 07/01/24 0756  LIPASE 36   No results for input(s): AMMONIA in the last 168 hours. Coagulation Profile: No results for input(s): INR, PROTIME in the last 168 hours. Cardiac Enzymes: No results for input(s): CKTOTAL, CKMB, CKMBINDEX, TROPONINI in the last 168 hours. BNP (last 3 results) No results for input(s): PROBNP in the last 8760  hours. HbA1C: No results for input(s): HGBA1C in the last 72 hours. CBG: No results for input(s): GLUCAP in the last 168 hours. Lipid Profile: No results for input(s): CHOL, HDL, LDLCALC, TRIG, CHOLHDL, LDLDIRECT in the last 72 hours. Thyroid  Function Tests: No results for input(s): TSH, T4TOTAL, FREET4, T3FREE, THYROIDAB in the last 72 hours. Anemia Panel: No results for input(s): VITAMINB12, FOLATE, FERRITIN, TIBC, IRON, RETICCTPCT in the last 72 hours. Urine analysis:    Component Value Date/Time   COLORURINE YELLOW 07/01/2024 0944   APPEARANCEUR CLEAR 07/01/2024 0944   LABSPEC 1.011 07/01/2024 0944   PHURINE 6.5 07/01/2024 0944   GLUCOSEU NEGATIVE 07/01/2024 0944   HGBUR NEGATIVE 07/01/2024 0944   BILIRUBINUR NEGATIVE 07/01/2024 0944   BILIRUBINUR neg 01/11/2019 1659   BILIRUBINUR Negative 11/19/2013 0000   KETONESUR NEGATIVE 07/01/2024  0944   PROTEINUR NEGATIVE 07/01/2024 0944   UROBILINOGEN 0.2 01/11/2019 1659   UROBILINOGEN 0.2 12/12/2014 1105   NITRITE NEGATIVE 07/01/2024 0944   LEUKOCYTESUR NEGATIVE 07/01/2024 0944   Sepsis Labs: @LABRCNTIP (procalcitonin:4,lacticidven:4) )No results found for this or any previous visit (from the past 240 hours).   Radiological Exams on Admission: US  Abdomen Limited RUQ (LIVER/GB) Result Date: 07/01/2024 CLINICAL DATA:  Epigastric pain with diarrhea EXAM: ULTRASOUND ABDOMEN LIMITED RIGHT UPPER QUADRANT COMPARISON:  None Available. FINDINGS: Gallbladder: The gallbladder wall measures 3 mm in thickness. Gallstones are identified which measure up to 4 mm. Sludge noted within the gallbladder. No pericholecystic fluid. Positive sonographic Murphy's sign. Common bile duct: Diameter: 2 mm Liver: No focal lesion identified. Within normal limits in parenchymal echogenicity. Portal vein is patent on color Doppler imaging with normal direction of blood flow towards the liver. Other: None. IMPRESSION: Gallstones  and sludge noted within the gallbladder. Gallbladder wall thickness is upper limits of normal at 3 mm. Positive sonographic Murphy's sign. Cannot exclude acute cholecystitis. Electronically Signed   By: Waddell Calk M.D.   On: 07/01/2024 08:56    EKG: Independently reviewed.  Normal sinus rhythm, no acute ST-T wave changes  Assessment/Plan Principal Problem:  Cholecystitis Cholelithiasis -Elevated LFTs, right upper quadrant tenderness on exam - RUQ ultrasound noted gallstones, sludge, gallbladder wall thickness is upper limits of normal, positive sonographic Murphy's - General Surgery consulted by EDP - N.p.o., supportive care, IV fluids for now  History of perinatal stroke Residual right hemiparesis  Hypertension -Resume amlodipine   Hyperglycemia - Recent A1c was 5.3  Obesity - About to start GLP-1 agonist, has not started yet  DVT prophylaxis: lovenox  Code Status: Full Code Disposition Plan: Home pending above workup Consults called: General Surgery called by EDP, eval pending Admission status: Inpatient  Sigurd Pac MD [facility]   07/01/2024, 2:33 PM

## 2024-07-01 NOTE — ED Provider Notes (Signed)
  EMERGENCY DEPARTMENT AT [facility] Provider Note   CSN: [id number] Arrival date & time: 07/01/24  9262     Patient presents with: Abdominal Pain   Tina Mckee is a 33 y.o. female.  She has been history of stroke which stopped her worsening right-sided deficits.  Complaining of subxiphoid abdominal pain that started around 9 AM this morning.  She thought it was acid reflux and stomach symptoms without improvement.  Rates the pain as 10 out of 10.  No nausea or vomiting.  Little bit of diarrhea.  No urinary symptoms.  Has had minor feelings like this in the past.  No fevers or chills.   The history is provided by the patient.  Abdominal Pain Pain location:  Epigastric Pain radiates to:  Does not radiate Pain severity:  Severe Onset quality:  Sudden Duration:  8 hours Timing:  Constant Progression:  Unchanged Chronicity:  Recurrent Relieved by:  Nothing Ineffective treatments:  Antacids Associated symptoms: diarrhea   Associated symptoms: no chest pain, no constipation, no cough, no dysuria, no fever, no nausea, no shortness of breath and no vomiting        Prior to Admission medications   Medication Sig Start Date End Date Taking? Authorizing Provider  amantadine  (SYMMETREL ) 100 MG capsule Take 2 capsules (200 mg total) by mouth 2 (two) times daily. 02/17/24   Skeet, Adam R, DO  amLODipine  (NORVASC ) 5 MG tablet TAKE 1 TABLET(5 MG) BY MOUTH DAILY 05/25/24   Panosh, Apolinar POUR, MD  betamethasone , augmented, (DIPROLENE ) 0.05 % lotion Apply topically. 05/22/24   [provider]  Ciclopirox 1 % shampoo Apply topically. 05/22/24   [provider]  citalopram  (CELEXA ) 40 MG tablet TAKE 1 TABLET(40 MG) BY MOUTH DAILY 06/20/24   Panosh, Wanda K, MD  fluticasone  (FLONASE ) 50 MCG/ACT nasal spray SHAKE LIQUID AND USE 1 SPRAY IN EACH NOSTRIL TWICE DAILY 12/23/23   Webb, Padonda B, FNP  levonorgestrel  (MIRENA , 52 MG,) 20 MCG/DAY IUD [facility] 04/01/22    [provider]  Multiple Vitamins-Calcium  (ONE-A-DAY WOMENS PO) Take 1 tablet by mouth daily.    [provider]  PROAIR  HFA 108 (90 Base) MCG/ACT inhaler INHALE 1-2 PUFFS INTO THE LUNGS EVERY 6 (SIX) HOURS AS NEEDED FOR WHEEZING OR SHORTNESS OF BREATH. Patient taking differently: Inhale 1-2 puffs into the lungs every 6 (six) hours as needed for wheezing or shortness of breath. 04/06/19   Panosh, Wanda K, MD  tirzepatide  (ZEPBOUND ) 2.5 MG/0.5ML Pen Inject 2.5 mg into the skin once a week. 06/27/24   Panosh, Wanda K, MD  VITAMIN E PO Take by mouth.    [provider]    Allergies: Cephalexin and Vibramycin  [doxycycline ]    Review of Systems  Constitutional:  Negative for fever.  Respiratory:  Negative for cough and shortness of breath.   Cardiovascular:  Negative for chest pain.  Gastrointestinal:  Positive for abdominal pain and diarrhea. Negative for constipation, nausea and vomiting.  Genitourinary:  Negative for dysuria.    Updated Vital Signs BP (!) 155/102   Pulse 78   Temp 97.8 F (36.6 C)   Resp 18   Ht 5' 1 (1.549 m)   Wt 106.6 kg   SpO2 97%   BMI 44.40 kg/m   Physical Exam Vitals and nursing note reviewed.  Constitutional:      General: She is not in acute distress.    Appearance: Normal appearance. She is well-developed.  HENT:  Head: Normocephalic and atraumatic.  Eyes:     Conjunctiva/sclera: Conjunctivae normal.  Cardiovascular:     Rate and Rhythm: Normal rate and regular rhythm.     Heart sounds: No murmur heard. Pulmonary:     Effort: Pulmonary effort is normal. No respiratory distress.     Breath sounds: Normal breath sounds. No stridor. No wheezing.  Abdominal:     Palpations: Abdomen is soft.     Tenderness: There is abdominal tenderness in the epigastric area. There is no guarding or rebound.  Musculoskeletal:     Cervical back: Neck supple.  Skin:    General: Skin is warm and dry.  Neurological:     General: No  focal deficit present.     Mental Status: She is alert.     GCS: GCS eye subscore is 4. GCS verbal subscore is 5. GCS motor subscore is 6.     Motor: Weakness present.     Comments: She has some weakness in her right arm and right leg     (all labs ordered are listed, but only abnormal results are displayed) Labs Reviewed  COMPREHENSIVE METABOLIC PANEL WITH GFR - Abnormal; Notable for the following components:      Result Value   Glucose, Bld 146 (*)    Calcium  10.8 (*)    AST 394 (*)    ALT 322 (*)    Alkaline Phosphatase 178 (*)    Total Bilirubin 2.1 (*)    All other components within normal limits  CBC - Abnormal; Notable for the following components:   Platelets 431 (*)    All other components within normal limits  CBC - Abnormal; Notable for the following components:   Platelets 438 (*)    All other components within normal limits  LIPASE, BLOOD  URINALYSIS, ROUTINE W REFLEX MICROSCOPIC  PREGNANCY, URINE  CREATININE, SERUM  HIV ANTIBODY (ROUTINE TESTING W REFLEX)  CBC  COMPREHENSIVE METABOLIC PANEL WITH GFR  TROPONIN T, HIGH SENSITIVITY    EKG: EKG Interpretation Date/Time:  Saturday July 01 2024 07:47:49 EDT Ventricular Rate:  71 PR Interval:    QRS Duration:  92 QT Interval:  406 QTC Calculation: 442 R Axis:   29  Text Interpretation: Normal sinus rhythm No significant change since prior 8/22 Confirmed by Towana Sharper [phone]) on 07/01/2024 7:51:49 AM  Radiology: US  Abdomen Limited RUQ (LIVER/GB) Result Date: 07/01/2024 CLINICAL DATA:  Epigastric pain with diarrhea EXAM: ULTRASOUND ABDOMEN LIMITED RIGHT UPPER QUADRANT COMPARISON:  None Available. FINDINGS: Gallbladder: The gallbladder wall measures 3 mm in thickness. Gallstones are identified which measure up to 4 mm. Sludge noted within the gallbladder. No pericholecystic fluid. Positive sonographic Murphy's sign. Common bile duct: Diameter: 2 mm Liver: No focal lesion identified. Within normal limits in  parenchymal echogenicity. Portal vein is patent on color Doppler imaging with normal direction of blood flow towards the liver. Other: None. IMPRESSION: Gallstones and sludge noted within the gallbladder. Gallbladder wall thickness is upper limits of normal at 3 mm. Positive sonographic Murphy's sign. Cannot exclude acute cholecystitis. Electronically Signed   By: Waddell Calk M.D.   On: 07/01/2024 08:56     Procedures   Medications Ordered in the ED  amLODipine  (NORVASC ) tablet 5 mg (5 mg Oral Given 07/01/24 1519)  citalopram  (CELEXA ) tablet 40 mg (40 mg Oral Given 07/01/24 1519)  amantadine  (SYMMETREL ) capsule 200 mg (has no administration in time range)  fluticasone  (FLONASE ) 50 MCG/ACT nasal spray 1 spray (has no administration in time  range)  albuterol  (PROVENTIL ) (2.5 MG/3ML) 0.083% nebulizer solution 3 mL (has no administration in time range)  ondansetron  (ZOFRAN ) injection 4 mg (has no administration in time range)  enoxaparin  (LOVENOX ) injection 40 mg (has no administration in time range)  HYDROmorphone  (DILAUDID ) injection 0.5-1 mg (1 mg Intravenous Given 07/01/24 1520)  acetaminophen  (TYLENOL ) tablet 650 mg (has no administration in time range)    Or  acetaminophen  (TYLENOL ) suppository 650 mg (has no administration in time range)  0.9 %  sodium chloride  infusion ( Intravenous New Bag/Given 07/01/24 1509)  ondansetron  (ZOFRAN ) injection 4 mg (4 mg Intravenous Given 07/01/24 0802)  fentaNYL  (SUBLIMAZE ) injection 50 mcg (50 mcg Intravenous Given 07/01/24 0802)  pantoprazole  (PROTONIX ) injection 40 mg (40 mg Intravenous Given 07/01/24 0801)  ciprofloxacin  (CIPRO ) IVPB 400 mg (0 mg Intravenous Stopped 07/01/24 1022)  fentaNYL  (SUBLIMAZE ) injection 50 mcg (50 mcg Intravenous Given 07/01/24 1221)    Clinical Course as of 07/01/24 1707  Sat Jul 01, 2024  0833 Labs showing normal white count, unremarkable chemistries but LFTs are elevated.  Right upper quadrant ultrasound ordered. [MB]  [id number]  Discussed with PA Simaan general surgery.  She will get back to me with surgery recommendations.  Patient updated [MB]    Clinical Course User Index [MB] Towana Ozell BROCKS, MD                                 Medical Decision Making Amount and/or Complexity of Data Reviewed Labs: ordered. Radiology: ordered.  Risk Prescription drug management. Decision regarding hospitalization.   This patient complains of upper abdominal pain; this involves an extensive number of treatment Options and is a complaint that carries with it a high risk of complications and morbidity. The differential includes peptic ulcer disease, cholelithiasis, cholecystitis, perforation, ACS  I ordered, reviewed and interpreted labs, which included CBC normal chemistries with elevated LFTs urinalysis and pregnancy negative troponin negative I ordered medication IV fluids and pain medicine nausea medicine PPI and reviewed PMP when indicated. I ordered imaging studies which included right upper quadrant ultrasound and I independently    visualized and interpreted imaging which showed probable acute cholecystitis Previous records obtained and reviewed in epic including recent PCP notes I consulted general surgery Dr. Ann, neurology Dr. Matthews, [facility] Dr. Vernon And discussed lab and imaging findings and discussed disposition.  Cardiac monitoring reviewed, sinus rhythm Social determinants considered, stress Critical Interventions: None  After the interventions stated above, I reevaluated the patient and found patient's pain to be improved Admission and further testing considered, she would benefit from mission to the hospital for general surgery consultation and likely cholecystectomy.      Final diagnoses:  Calculus of gallbladder and bile duct with acute cholecystitis with obstruction    ED Discharge Orders     None          Towana Ozell BROCKS, MD 07/01/24 1710

## 2024-07-01 NOTE — Anesthesia Preprocedure Evaluation (Signed)
 Anesthesia Evaluation  Patient identified by MRN, date of birth, ID band Patient awake    Reviewed: Allergy & Precautions, NPO status , Patient's Chart, lab work & pertinent test results  Airway Mallampati: III  TM Distance: >3 FB Neck ROM: Full    Dental no notable dental hx. (+) Dental Advisory Given, Teeth Intact   Pulmonary asthma , sleep apnea , former smoker   Pulmonary exam normal breath sounds clear to auscultation       Cardiovascular hypertension, Pt. on medications  Rhythm:Regular Rate:Normal     Neuro/Psych CVA, Residual Symptoms    GI/Hepatic negative GI ROS, Neg liver ROS,,,  Endo/Other    Class 3 obesity  Renal/GU negative Renal ROS     Musculoskeletal negative musculoskeletal ROS (+)    Abdominal  (+) + obese  Peds  Hematology negative hematology ROS (+)   Anesthesia Other Findings   Reproductive/Obstetrics negative OB ROS                              Anesthesia Physical Anesthesia Plan  ASA: 3  Anesthesia Plan: General   Post-op Pain Management: Gabapentin  PO (pre-op)*   Induction: Intravenous  PONV Risk Score and Plan: 4 or greater and Ondansetron , Treatment may vary due to age or medical condition, Midazolam  and Dexamethasone   Airway Management Planned: Oral ETT  Additional Equipment:   Intra-op Plan:   Post-operative Plan: Extubation in OR  Informed Consent: I have reviewed the patients History and Physical, chart, labs and discussed the procedure including the risks, benefits and alternatives for the proposed anesthesia with the patient or authorized representative who has indicated his/her understanding and acceptance.     Dental advisory given  Plan Discussed with: CRNA  Anesthesia Plan Comments:          Anesthesia Quick Evaluation

## 2024-07-01 NOTE — ED Notes (Signed)
 Called Kimberly at [facility] for transport

## 2024-07-01 NOTE — Consult Note (Signed)
 Reason for Consult/Chief Complaint: gallstones, hyperbilirubinemia Consultant: Fairy, MD  Tina Mckee is an 33 y.o. female.   HPI: 83F with RUQ abdominal pain that began 0100 on 06/30/2024. Tried tums and peto bismol without relief. Went to [facility] and underwent US  with stones, sludge, and wall thickening. Denies n/v. Loose nonbloody BM, most recently 7/18 around 2300. Has had two prior episodes of pain, last month and two weeks, but symptoms not severe enough to cause her to seek care. No prior abdominal surgery. Takes amantadine , amlodipine , citalopram , vitamin E. Perinatal stroke with residual RUE/RLE weakness, ambulatory, but wears an AFO. No further strokes. Prior smoker, quit at age 72. One glass of wine q4d. Denies recreational drug use.   Past Medical History:  Diagnosis Date   Asthma    Gestational hypertension    Perinatal arterial ischemic stroke (HCC)    Scoliosis    Sleep apnea    Stroke [facility]) birth   rt hemiparesis  Perinatal   Supervision of other normal pregnancy 05/04/2014    Past Surgical History:  Procedure Laterality Date   FOOT SURGERY     rt foot   FOOT SURGERY     Bone placed   moles removed     from back    Family History  Problem Relation Age of Onset   Hypertension Mother    Asthma Mother    Hypertension Father    Sleep apnea Father    Asthma Sister    Cancer Maternal Grandfather        pancreatic   Cancer Paternal Grandmother        cancer all over   Other Paternal Grandfather        brain damage   Other Maternal Grandmother        heart problems    Social History:  reports that she quit smoking about 16 years ago. Her smoking use included cigarettes. She has never used smokeless tobacco. She reports current alcohol use of about 1.0 standard drink of alcohol per week. She reports that she does not use drugs.  Allergies:  Allergies  Allergen Reactions   Cephalexin Rash and Itching   Vibramycin  [Doxycycline ] Itching and Rash     Medications: I have reviewed the patient's current medications.  Results for orders placed or performed during the hospital encounter of 07/01/24 (from the past 48 hours)  Lipase, blood     Status: None   Collection Time: 07/01/24  7:56 AM  Result Value Ref Range   Lipase 36 11 - 51 U/L    Comment: Performed at [facility], [address] [place], [place] [address]  Comprehensive metabolic panel     Status: Abnormal   Collection Time: 07/01/24  7:56 AM  Result Value Ref Range   Sodium 138 135 - 145 mmol/L   Potassium 4.0 3.5 - 5.1 mmol/L   Chloride 102 98 - 111 mmol/L   CO2 24 22 - 32 mmol/L   Glucose, Bld 146 (H) 70 - 99 mg/dL    Comment: Glucose reference range applies only to samples taken after fasting for at least 8 hours.   BUN 14 6 - 20 mg/dL   Creatinine, Ser 9.31 0.44 - 1.00 mg/dL   Calcium  10.8 (H) 8.9 - 10.3 mg/dL   Total Protein 7.2 6.5 - 8.1 g/dL   Albumin 4.6 3.5 - 5.0 g/dL   AST 605 (H) 15 - 41 U/L   ALT 322 (H) 0 - 44 U/L  Alkaline Phosphatase 178 (H) 38 - 126 U/L   Total Bilirubin 2.1 (H) 0.0 - 1.2 mg/dL   GFR, Estimated >39 >39 mL/min    Comment: (NOTE) Calculated using the CKD-EPI Creatinine Equation (2021)    Anion gap 13 5 - 15    Comment: Performed at [facility], [address] [place], [place] [address]  CBC     Status: Abnormal   Collection Time: 07/01/24  7:56 AM  Result Value Ref Range   WBC 8.8 4.0 - 10.5 K/uL   RBC 4.81 3.87 - 5.11 MIL/uL   Hemoglobin 14.4 12.0 - 15.0 g/dL   HCT 58.8 63.9 - 53.9 %   MCV 85.4 80.0 - 100.0 fL   MCH 29.9 26.0 - 34.0 pg   MCHC 35.0 30.0 - 36.0 g/dL   RDW 88.2 88.4 - 84.4 %   Platelets 431 (H) 150 - 400 K/uL   nRBC 0.0 0.0 - 0.2 %    Comment: Performed at [facility], [address] [place], [place] [address]  Troponin T, High Sensitivity     Status: None   Collection Time: 07/01/24  7:56 AM  Result Value Ref Range   Troponin T High  Sensitivity <15 <19 ng/L    Comment: (NOTE) Biotin concentrations > 1000 ng/mL falsely decrease TnT results.  Serial cardiac troponin measurements are suggested.  Refer to the Links section for chest pain algorithms and additional  guidance. Performed at [facility], [address] [place], [place] [address]   Urinalysis, Routine w reflex microscopic -Urine, Clean Catch     Status: None   Collection Time: 07/01/24  9:44 AM  Result Value Ref Range   Color, Urine YELLOW YELLOW   APPearance CLEAR CLEAR   Specific Gravity, Urine 1.011 1.005 - 1.030   pH 6.5 5.0 - 8.0   Glucose, UA NEGATIVE NEGATIVE mg/dL   Hgb urine dipstick NEGATIVE NEGATIVE   Bilirubin Urine NEGATIVE NEGATIVE   Ketones, ur NEGATIVE NEGATIVE mg/dL   Protein, ur NEGATIVE NEGATIVE mg/dL   Nitrite NEGATIVE NEGATIVE   Leukocytes,Ua NEGATIVE NEGATIVE    Comment: Performed at [facility], [address] [place], [place] [address]  Pregnancy, urine     Status: None   Collection Time: 07/01/24  9:44 AM  Result Value Ref Range   Preg Test, Ur NEGATIVE NEGATIVE    Comment:        THE SENSITIVITY OF THIS METHODOLOGY IS >20 mIU/mL. Performed at [facility], [address] [place], [place] [address]   HIV Antibody (routine testing w rflx)     Status: None   Collection Time: 07/01/24  4:09 PM  Result Value Ref Range   HIV Screen 4th Generation wRfx Non Reactive Non Reactive    Comment: Performed at [facility], [address] [place], [place] [address]  CBC     Status: Abnormal   Collection Time: 07/01/24  4:09 PM  Result Value Ref Range   WBC 7.7 4.0 - 10.5 K/uL   RBC 4.92 3.87 - 5.11 MIL/uL   Hemoglobin 14.8 12.0 - 15.0 g/dL   HCT 57.5 63.9 - 53.9 %   MCV 86.2 80.0 - 100.0 fL   MCH 30.1 26.0 - 34.0 pg   MCHC 34.9 30.0 - 36.0 g/dL   RDW 88.1 88.4 - 84.4 %   Platelets 438 (H) 150 - 400 K/uL   nRBC 0.0 0.0 - 0.2 %    Comment: Performed at  [facility], [address] [place], [place] [address]  Creatinine, serum     Status: None   Collection Time: 07/01/24  4:09 PM  Result Value Ref Range   Creatinine, Ser 0.54 0.44 - 1.00 mg/dL   GFR, Estimated >39 >39 mL/min    Comment: (NOTE) Calculated using the CKD-EPI Creatinine Equation (2021) Performed at [facility], [address], [place], [place] [address]     US  Abdomen Limited RUQ (LIVER/GB) Result Date: 07/01/2024 CLINICAL DATA:  Epigastric pain with diarrhea EXAM: ULTRASOUND ABDOMEN LIMITED RIGHT UPPER QUADRANT COMPARISON:  None Available. FINDINGS: Gallbladder: The gallbladder wall measures 3 mm in thickness. Gallstones are identified which measure up to 4 mm. Sludge noted within the gallbladder. No pericholecystic fluid. Positive sonographic Murphy's sign. Common bile duct: Diameter: 2 mm Liver: No focal lesion identified. Within normal limits in parenchymal echogenicity. Portal vein is patent on color Doppler imaging with normal direction of blood flow towards the liver. Other: None. IMPRESSION: Gallstones and sludge noted within the gallbladder. Gallbladder wall thickness is upper limits of normal at 3 mm. Positive sonographic Murphy's sign. Cannot exclude acute cholecystitis. Electronically Signed   By: Waddell Calk M.D.   On: 07/01/2024 08:56    ROS 10 point review of systems is negative except as listed above in HPI.   Physical Exam Blood pressure (!) 131/91, pulse 73, temperature 97.9 F (36.6 C), temperature source Oral, resp. rate 16, height 5' 1 (1.549 m), weight 106.6 kg, SpO2 97%. Constitutional: well-developed, well-nourished HEENT: pupils equal, round, reactive to light, 2mm b/l, moist conjunctiva, external inspection of ears and nose normal, hearing intact Oropharynx: normal oropharyngeal mucosa, normal dentition Neck: no thyromegaly, trachea midline, no midline cervical tenderness to palpation Chest: breath sounds equal bilaterally, normal  respiratory effort, no midline or lateral chest wall tenderness to palpation/deformity Abdomen: soft, epigastric TTP, no bruising, no hepatosplenomegaly GU: normal female genitalia  Extremities: 2+ radial and pedal pulses bilaterally, intact motor and sensation bilateral UE and LE, no peripheral edema MSK: unable to assess gait/station, no clubbing/cyanosis of fingers/toes, normal ROM of all four extremities Skin: warm, dry, no rashes Psych: normal memory, normal mood/affect     Assessment/Plan: Gallstones and hyperbilirubinemia - recommend lap chole with IOC. Informed consent was obtained after detailed explanation of risks, including bleeding, infection, biloma, hematoma, injury to common bile duct, need for IOC to delineate anatomy, and need for conversion to open procedure. All questions answered to the patient's satisfaction. FEN - NPO except sips/chips at midnight DVT - SCDs, LMWH recommended Dispo - med-surg    Dreama GEANNIE Hanger, MD General and Trauma Surgery [facility]

## 2024-07-01 NOTE — Plan of Care (Signed)
   Problem: Education: Goal: Knowledge of General Education information will improve Description Including pain rating scale, medication(s)/side effects and non-pharmacologic comfort measures Outcome: Progressing

## 2024-07-01 NOTE — ED Triage Notes (Signed)
 Pt to ER with c/o epigastric pain that started around 2AM. Pt reports taking TUMS and Pepto without relief.  Pt denies nausea vomiting. Pain radiates into back.

## 2024-07-02 ENCOUNTER — Encounter (HOSPITAL_COMMUNITY)
Admission: EM | Disposition: A | Discharge status: Home / Self Care | Payer: Self-pay | Source: Home / Self Care | Attending: Internal Medicine

## 2024-07-02 ENCOUNTER — Inpatient Hospital Stay (HOSPITAL_COMMUNITY): Payer: Self-pay | Admitting: Anesthesiology

## 2024-07-02 ENCOUNTER — Encounter (HOSPITAL_COMMUNITY): Payer: Self-pay | Admitting: Internal Medicine

## 2024-07-02 ENCOUNTER — Inpatient Hospital Stay (HOSPITAL_COMMUNITY)

## 2024-07-02 ENCOUNTER — Other Ambulatory Visit: Payer: Self-pay

## 2024-07-02 DIAGNOSIS — K819 Cholecystitis, unspecified: Secondary | ICD-10-CM

## 2024-07-02 DIAGNOSIS — K8050 Calculus of bile duct without cholangitis or cholecystitis without obstruction: Secondary | ICD-10-CM | POA: Diagnosis not present

## 2024-07-02 DIAGNOSIS — K8010 Calculus of gallbladder with chronic cholecystitis without obstruction: Secondary | ICD-10-CM | POA: Diagnosis not present

## 2024-07-02 DIAGNOSIS — K8060 Calculus of gallbladder and bile duct with cholecystitis, unspecified, without obstruction: Secondary | ICD-10-CM | POA: Diagnosis not present

## 2024-07-02 DIAGNOSIS — Z9049 Acquired absence of other specified parts of digestive tract: Secondary | ICD-10-CM | POA: Diagnosis not present

## 2024-07-02 DIAGNOSIS — I10 Essential (primary) hypertension: Secondary | ICD-10-CM | POA: Diagnosis not present

## 2024-07-02 DIAGNOSIS — J45909 Unspecified asthma, uncomplicated: Secondary | ICD-10-CM | POA: Diagnosis not present

## 2024-07-02 HISTORY — PX: CHOLECYSTECTOMY: SHX55

## 2024-07-02 LAB — COMPREHENSIVE METABOLIC PANEL WITH GFR
ALT: 526 U/L — ABNORMAL HIGH (ref 0–44)
AST: 258 U/L — ABNORMAL HIGH (ref 15–41)
Albumin: 3.9 g/dL (ref 3.5–5.0)
Alkaline Phosphatase: 177 U/L — ABNORMAL HIGH (ref 38–126)
Anion gap: 13 (ref 5–15)
BUN: 8 mg/dL (ref 6–20)
CO2: 21 mmol/L — ABNORMAL LOW (ref 22–32)
Calcium: 9 mg/dL (ref 8.9–10.3)
Chloride: 103 mmol/L (ref 98–111)
Creatinine, Ser: 0.57 mg/dL (ref 0.44–1.00)
GFR, Estimated: >60 mL/min (ref >60)
Glucose, Bld: 115 mg/dL — ABNORMAL HIGH (ref 70–99)
Potassium: 3.9 mmol/L (ref 3.5–5.1)
Sodium: 137 mmol/L (ref 135–145)
Total Bilirubin: 2.7 mg/dL — ABNORMAL HIGH (ref 0.0–1.2)
Total Protein: 6.7 g/dL (ref 6.5–8.1)

## 2024-07-02 LAB — SURGICAL PCR SCREEN
MRSA, PCR: NEGATIVE
Staphylococcus aureus: NEGATIVE

## 2024-07-02 LAB — CBC
HCT: 42.1 % (ref 36.0–46.0)
Hemoglobin: 14.6 g/dL (ref 12.0–15.0)
MCH: 29.9 pg (ref 26.0–34.0)
MCHC: 34.7 g/dL (ref 30.0–36.0)
MCV: 86.3 fL (ref 80.0–100.0)
Platelets: 399 K/uL (ref 150–400)
RBC: 4.88 MIL/uL (ref 3.87–5.11)
RDW: 11.7 % (ref 11.5–15.5)
WBC: 8.5 K/uL (ref 4.0–10.5)
nRBC: 0 % (ref 0.0–0.2)

## 2024-07-02 SURGERY — LAPAROSCOPIC CHOLECYSTECTOMY WITH INTRAOPERATIVE CHOLANGIOGRAM
Anesthesia: General

## 2024-07-02 MED ORDER — ACETAMINOPHEN 500 MG PO TABS
1000.0000 mg | ORAL_TABLET | Freq: Four times a day (QID) | ORAL | Status: DC
  Administered 2024-07-02 – 2024-07-03 (×4): 1000 mg via ORAL
  Filled 2024-07-02 (×4): qty 2

## 2024-07-02 MED ORDER — BUPIVACAINE-EPINEPHRINE (PF) 0.25% -1:200000 IJ SOLN
INTRAMUSCULAR | Status: AC
  Filled 2024-07-02: qty 30

## 2024-07-02 MED ORDER — 0.9 % SODIUM CHLORIDE (POUR BTL) OPTIME
TOPICAL | Status: DC | PRN
  Administered 2024-07-02: 1000 mL

## 2024-07-02 MED ORDER — DROPERIDOL 2.5 MG/ML IJ SOLN: 0.6250 mg | Freq: Once | INTRAMUSCULAR | Status: DC | PRN

## 2024-07-02 MED ORDER — PIPERACILLIN-TAZOBACTAM 3.375 G IVPB 30 MIN
3.3750 g | Freq: Once | INTRAVENOUS | Status: AC
  Administered 2024-07-02: 3.375 g via INTRAVENOUS
  Filled 2024-07-02: qty 50

## 2024-07-02 MED ORDER — HYDROMORPHONE HCL 1 MG/ML IJ SOLN
0.5000 mg | INTRAMUSCULAR | Status: DC | PRN
  Administered 2024-07-02: 1 mg via INTRAVENOUS
  Filled 2024-07-02: qty 1

## 2024-07-02 MED ORDER — SODIUM CHLORIDE 0.9 % IR SOLN
Status: DC | PRN
Start: 2024-07-02 — End: 2024-07-02
  Administered 2024-07-02: 1000 mL

## 2024-07-02 MED ORDER — CHLORHEXIDINE GLUCONATE 0.12 % MT SOLN: 15.0000 mL | Freq: Once | OROMUCOSAL | Status: AC

## 2024-07-02 MED ORDER — KETOROLAC TROMETHAMINE 30 MG/ML IJ SOLN
INTRAMUSCULAR | Status: AC
  Filled 2024-07-02: qty 1

## 2024-07-02 MED ORDER — ORAL CARE MOUTH RINSE
15.0000 mL | Freq: Once | OROMUCOSAL | Status: AC
Start: 2024-07-02 — End: 2024-07-02
  Administered 2024-07-02: 15 mL via OROMUCOSAL

## 2024-07-02 MED ORDER — PROPOFOL 10 MG/ML IV BOLUS
INTRAVENOUS | Status: DC | PRN
  Administered 2024-07-02: 150 mg via INTRAVENOUS

## 2024-07-02 MED ORDER — ONDANSETRON HCL 4 MG/2ML IJ SOLN
INTRAMUSCULAR | Status: DC | PRN
  Administered 2024-07-02: 4 mg via INTRAVENOUS

## 2024-07-02 MED ORDER — INDOCYANINE GREEN 25 MG IV SOLR
2.5000 mg | Freq: Once | INTRAVENOUS | Status: AC
  Administered 2024-07-02: 2.5 mg via INTRAVENOUS
  Filled 2024-07-02: qty 10

## 2024-07-02 MED ORDER — ROCURONIUM BROMIDE 10 MG/ML (PF) SYRINGE
PREFILLED_SYRINGE | INTRAVENOUS | Status: DC | PRN
  Administered 2024-07-02: 10 mg via INTRAVENOUS
  Administered 2024-07-02: 20 mg via INTRAVENOUS
  Administered 2024-07-02: 50 mg via INTRAVENOUS

## 2024-07-02 MED ORDER — HYDROMORPHONE HCL 1 MG/ML IJ SOLN
0.2500 mg | INTRAMUSCULAR | Status: DC | PRN
  Administered 2024-07-02: 0.5 mg via INTRAVENOUS

## 2024-07-02 MED ORDER — MIDAZOLAM HCL 2 MG/2ML IJ SOLN
INTRAMUSCULAR | Status: DC | PRN
  Administered 2024-07-02 (×2): 1 mg via INTRAVENOUS

## 2024-07-02 MED ORDER — MIDAZOLAM HCL 2 MG/2ML IJ SOLN
INTRAMUSCULAR | Status: AC
  Filled 2024-07-02: qty 2

## 2024-07-02 MED ORDER — LACTATED RINGERS IV SOLN: INTRAVENOUS | Status: DC

## 2024-07-02 MED ORDER — LIDOCAINE 2% (20 MG/ML) 5 ML SYRINGE
INTRAMUSCULAR | Status: DC | PRN
  Administered 2024-07-02: 100 mg via INTRAVENOUS

## 2024-07-02 MED ORDER — KETOROLAC TROMETHAMINE 30 MG/ML IJ SOLN
INTRAMUSCULAR | Status: DC | PRN
  Administered 2024-07-02: 30 mg via INTRAVENOUS

## 2024-07-02 MED ORDER — FENTANYL CITRATE (PF) 250 MCG/5ML IJ SOLN
INTRAMUSCULAR | Status: AC
  Filled 2024-07-02: qty 5

## 2024-07-02 MED ORDER — DEXAMETHASONE SODIUM PHOSPHATE 10 MG/ML IJ SOLN
INTRAMUSCULAR | Status: DC | PRN
  Administered 2024-07-02: 8 mg via INTRAVENOUS

## 2024-07-02 MED ORDER — GABAPENTIN 300 MG PO CAPS
300.0000 mg | ORAL_CAPSULE | Freq: Once | ORAL | Status: AC
  Administered 2024-07-02: 300 mg via ORAL

## 2024-07-02 MED ORDER — HYDROMORPHONE HCL 1 MG/ML IJ SOLN
INTRAMUSCULAR | Status: AC
  Filled 2024-07-02: qty 1

## 2024-07-02 MED ORDER — GADOBUTROL 1 MMOL/ML IV SOLN
10.0000 mL | Freq: Once | INTRAVENOUS | Status: AC | PRN
  Administered 2024-07-02: 10 mL via INTRAVENOUS

## 2024-07-02 MED ORDER — FENTANYL CITRATE (PF) 250 MCG/5ML IJ SOLN
INTRAMUSCULAR | Status: DC | PRN
  Administered 2024-07-02 (×5): 50 ug via INTRAVENOUS

## 2024-07-02 MED ORDER — KETOROLAC TROMETHAMINE 15 MG/ML IJ SOLN
15.0000 mg | Freq: Four times a day (QID) | INTRAMUSCULAR | Status: AC
  Administered 2024-07-02 – 2024-07-03 (×4): 15 mg via INTRAVENOUS
  Filled 2024-07-02 (×4): qty 1

## 2024-07-02 MED ORDER — SUGAMMADEX SODIUM 200 MG/2ML IV SOLN
INTRAVENOUS | Status: DC | PRN
  Administered 2024-07-02: 200 mg via INTRAVENOUS

## 2024-07-02 MED ORDER — SODIUM CHLORIDE 0.9 % IV SOLN
INTRAVENOUS | Status: DC | PRN
  Administered 2024-07-02: 20 mL

## 2024-07-02 MED ORDER — OXYCODONE HCL 5 MG PO TABS
5.0000 mg | ORAL_TABLET | ORAL | Status: DC | PRN
  Administered 2024-07-02 (×2): 10 mg via ORAL
  Administered 2024-07-03: 5 mg via ORAL
  Filled 2024-07-02: qty 2
  Filled 2024-07-02: qty 1
  Filled 2024-07-02: qty 2

## 2024-07-02 MED ORDER — BUPIVACAINE-EPINEPHRINE 0.25% -1:200000 IJ SOLN
INTRAMUSCULAR | Status: DC | PRN
Start: 2024-07-02 — End: 2024-07-02
  Administered 2024-07-02: 30 mL

## 2024-07-02 MED ORDER — PROPOFOL 10 MG/ML IV BOLUS
INTRAVENOUS | Status: AC
  Filled 2024-07-02: qty 20

## 2024-07-02 MED ORDER — GABAPENTIN 300 MG PO CAPS
ORAL_CAPSULE | ORAL | Status: AC
  Filled 2024-07-02: qty 1

## 2024-07-02 MED ORDER — ACETAMINOPHEN 10 MG/ML IV SOLN
INTRAVENOUS | Status: DC | PRN
Start: 2024-07-02 — End: 2024-07-02
  Administered 2024-07-02: 1000 mg via INTRAVENOUS

## 2024-07-02 MED ORDER — ACETAMINOPHEN 650 MG RE SUPP: 650.0000 mg | Freq: Four times a day (QID) | RECTAL | Status: DC

## 2024-07-02 SURGICAL SUPPLY — 40 items
BAG COUNTER SPONGE SURGICOUNT (BAG) ×1 IMPLANT
CANISTER SUCTION 3000ML PPV (SUCTIONS) ×1 IMPLANT
CATH CHOLANG 76X19 KUMAR (CATHETERS) IMPLANT
CHLORAPREP W/TINT 26 (MISCELLANEOUS) ×1 IMPLANT
CLIP APPLIE 5 13 M/L LIGAMAX5 (MISCELLANEOUS) IMPLANT
CLIP LIGATING HEMO LOK XL GOLD (MISCELLANEOUS) IMPLANT
CLIP LIGATING HEMO O LOK GREEN (MISCELLANEOUS) ×1 IMPLANT
CNTNR URN SCR LID CUP LEK RST (MISCELLANEOUS) ×1 IMPLANT
COVER SURGICAL LIGHT HANDLE (MISCELLANEOUS) ×1 IMPLANT
DERMABOND ADVANCED .7 DNX12 (GAUZE/BANDAGES/DRESSINGS) ×1 IMPLANT
DERMABOND ADVANCED .7 DNX6 (GAUZE/BANDAGES/DRESSINGS) IMPLANT
ELECTRODE REM PT RTRN 9FT ADLT (ELECTROSURGICAL) ×1 IMPLANT
GAUZE 4X4 16PLY ~~LOC~~+RFID DBL (SPONGE) ×1 IMPLANT
GLOVE BIOGEL PI MICRO STRL 6 (GLOVE) ×1 IMPLANT
GLOVE INDICATOR 6.5 STRL GRN (GLOVE) ×1 IMPLANT
GOWN STRL REUS W/ TWL LRG LVL3 (GOWN DISPOSABLE) ×1 IMPLANT
GRASPER SUT TROCAR 14GX15 (MISCELLANEOUS) ×1 IMPLANT
IRRIGATION SUCT STRKRFLW 2 WTP (MISCELLANEOUS) ×1 IMPLANT
KIT BASIN OR (CUSTOM PROCEDURE TRAY) ×1 IMPLANT
KIT TURNOVER KIT B (KITS) ×1 IMPLANT
LHOOK LAP DISP 36CM (ELECTROSURGICAL) ×1 IMPLANT
NDL INSUFFLATION 14GA 120MM (NEEDLE) ×1 IMPLANT
NEEDLE INSUFFLATION 14GA 120MM (NEEDLE) ×1 IMPLANT
NS IRRIG 1000ML POUR BTL (IV SOLUTION) ×1 IMPLANT
PAD ARMBOARD POSITIONER FOAM (MISCELLANEOUS) ×1 IMPLANT
PENCIL BUTTON HOLSTER BLD 10FT (ELECTRODE) ×1 IMPLANT
POUCH LAPAROSCOPIC INSTRUMENT (MISCELLANEOUS) ×1 IMPLANT
SCISSORS LAP 5X35 DISP (ENDOMECHANICALS) ×1 IMPLANT
SET TUBE SMOKE EVAC HIGH FLOW (TUBING) ×1 IMPLANT
SLEEVE Z-THREAD 5X100MM (TROCAR) ×2 IMPLANT
SPONGE T-LAP 18X18 ~~LOC~~+RFID (SPONGE) ×1 IMPLANT
SUT MNCRL AB 4-0 PS2 18 (SUTURE) ×1 IMPLANT
SYSTEM BAG RETRIEVAL 10MM (BASKET) ×1 IMPLANT
TOWEL GREEN STERILE (TOWEL DISPOSABLE) ×1 IMPLANT
TOWEL GREEN STERILE FF (TOWEL DISPOSABLE) ×1 IMPLANT
TRAY LAPAROSCOPIC MC (CUSTOM PROCEDURE TRAY) ×1 IMPLANT
TROCAR Z THREAD OPTICAL 12X100 (TROCAR) ×1 IMPLANT
TROCAR Z-THREAD OPTICAL 5X100M (TROCAR) ×1 IMPLANT
WARMER LAPAROSCOPE (MISCELLANEOUS) ×1 IMPLANT
WATER STERILE IRR 1000ML POUR (IV SOLUTION) ×1 IMPLANT

## 2024-07-02 NOTE — Progress Notes (Signed)
 PROGRESS NOTE    Tina Mckee  FMW:[id number] DOB: 1991/03/30 DOA: 07/01/2024 PCP: Charlett Apolinar POUR, MD   33/F with history of perinatal stroke, residual right hemiparesis, obesity, hypertension, dyslipidemia developed severe epigastric/right-sided abdominal pain around 1 AM this morning which woke her up, subsequently tried some Pepto-Bismol followed by Tums without any relief, symptoms persisted through the morning, also noticed some chills, eventually presented to [facility] ER ED Course: Hypertensive, other vital stable, labs noted platelets 431, creatinine 0.6, WBC 8, urine pregnancy negative, UA normal, abdominal ultrasound with gallstones, gallbladder sludge, gallbladder wall thickness at upper limit of normal, positive sonographic Murphy's sign, EDP consulted general surgery who recommended admission per [facility]     Subjective: Feels fair, mild right-sided abdominal pain  Assessment and Plan:  Cholecystitis Cholelithiasis -Elevated LFTs, right upper quadrant tenderness on exam - RUQ ultrasound noted gallstones, sludge, gallbladder wall thickness is upper limits of normal, positive sonographic Murphy's - General Surgery following, plan for lap chole today   History of perinatal stroke Residual right hemiparesis Tremors, continue amantadine    Hypertension - Stable, continue amlodipine    Hyperglycemia - Recent A1c was 5.3   Obesity - About to start GLP-1 agonist, has not started yet     DVT prophylaxis: Lovenox  Code Status: full code Family Communication: Disposition Plan: Home in 1-2days  Consultants:    Procedures:   Antimicrobials:    Objective: Vitals:   07/01/24 1714 07/01/24 2000 07/02/24 0452 07/02/24 0838  BP: (!) 131/91 128/83 (!) 123/92 130/87  Pulse: 73 71 64 72  Resp: 16 17 16 16   Temp: 97.9 F (36.6 C) 97.9 F (36.6 C) 98 F (36.7 C) 97.8 F (36.6 C)  TempSrc: Oral Oral Oral Oral  SpO2: 97% 100% 96% 95%  Weight:      Height:         Intake/Output Summary (Last 24 hours) at 07/02/2024 1112 Last data filed at 07/02/2024 1050 Gross per 24 hour  Intake 50 ml  Output --  Net 50 ml   Filed Weights   07/01/24 0747  Weight: 106.6 kg    Examination:  General exam: Appears calm and comfortable  Respiratory system: Clear to auscultation Cardiovascular system: S1 & S2 heard, RRR.  Abd: Obese, mild right-sided tenderness, bowel sounds decreased Central nervous system: Right hemiparesis Extremities: no edema Skin: No rashes Psychiatry:  Mood & affect appropriate.     Data Reviewed:   CBC: Recent Labs  Lab 07/01/24 0756 07/01/24 1609 07/02/24 0804  WBC 8.8 7.7 8.5  HGB 14.4 14.8 14.6  HCT 41.1 42.4 42.1  MCV 85.4 86.2 86.3  PLT 431* 438* 399   Basic Metabolic Panel: Recent Labs  Lab 07/01/24 0756 07/01/24 1609 07/02/24 0804  NA 138  --  137  K 4.0  --  3.9  CL 102  --  103  CO2 24  --  21*  GLUCOSE 146*  --  115*  BUN 14  --  8  CREATININE 0.68 0.54 0.57  CALCIUM  10.8*  --  9.0   GFR: Estimated Creatinine Clearance: 110.5 mL/min (by C-G formula based on SCr of 0.57 mg/dL). Liver Function Tests: Recent Labs  Lab 07/01/24 0756 07/02/24 0804  AST 394* 258*  ALT 322* 526*  ALKPHOS 178* 177*  BILITOT 2.1* 2.7*  PROT 7.2 6.7  ALBUMIN 4.6 3.9   Recent Labs  Lab 07/01/24 0756  LIPASE 36   No results for input(s): AMMONIA in the last 168 hours. Coagulation Profile: No  results for input(s): INR, PROTIME in the last 168 hours. Cardiac Enzymes: No results for input(s): CKTOTAL, CKMB, CKMBINDEX, TROPONINI in the last 168 hours. BNP (last 3 results) No results for input(s): PROBNP in the last 8760 hours. HbA1C: No results for input(s): HGBA1C in the last 72 hours. CBG: No results for input(s): GLUCAP in the last 168 hours. Lipid Profile: No results for input(s): CHOL, HDL, LDLCALC, TRIG, CHOLHDL, LDLDIRECT in the last 72 hours. Thyroid  Function  Tests: No results for input(s): TSH, T4TOTAL, FREET4, T3FREE, THYROIDAB in the last 72 hours. Anemia Panel: No results for input(s): VITAMINB12, FOLATE, FERRITIN, TIBC, IRON, RETICCTPCT in the last 72 hours. Urine analysis:    Component Value Date/Time   COLORURINE YELLOW 07/01/2024 0944   APPEARANCEUR CLEAR 07/01/2024 0944   LABSPEC 1.011 07/01/2024 0944   PHURINE 6.5 07/01/2024 0944   GLUCOSEU NEGATIVE 07/01/2024 0944   HGBUR NEGATIVE 07/01/2024 0944   BILIRUBINUR NEGATIVE 07/01/2024 0944   BILIRUBINUR neg 01/11/2019 1659   BILIRUBINUR Negative 11/19/2013 0000   KETONESUR NEGATIVE 07/01/2024 0944   PROTEINUR NEGATIVE 07/01/2024 0944   UROBILINOGEN 0.2 01/11/2019 1659   UROBILINOGEN 0.2 12/12/2014 1105   NITRITE NEGATIVE 07/01/2024 0944   LEUKOCYTESUR NEGATIVE 07/01/2024 0944   Sepsis Labs: @LABRCNTIP (procalcitonin:4,lacticidven:4)  ) Recent Results (from the past 240 hours)  Surgical PCR screen     Status: None   Collection Time: 07/01/24  8:38 PM   Specimen: Nasal Mucosa; Nasal Swab  Result Value Ref Range Status   MRSA, PCR NEGATIVE NEGATIVE Final   Staphylococcus aureus NEGATIVE NEGATIVE Final    Comment: (NOTE) The Xpert SA Assay (FDA approved for NASAL specimens in patients 44 years of age and older), is one component of a comprehensive surveillance program. It is not intended to diagnose infection nor to guide or monitor treatment. Performed at [facility], [address], [place], [place] [address]      Radiology Studies: US  Abdomen Limited RUQ (LIVER/GB) Result Date: 07/01/2024 CLINICAL DATA:  Epigastric pain with diarrhea EXAM: ULTRASOUND ABDOMEN LIMITED RIGHT UPPER QUADRANT COMPARISON:  None Available. FINDINGS: Gallbladder: The gallbladder wall measures 3 mm in thickness. Gallstones are identified which measure up to 4 mm. Sludge noted within the gallbladder. No pericholecystic fluid. Positive sonographic Murphy's sign. Common  bile duct: Diameter: 2 mm Liver: No focal lesion identified. Within normal limits in parenchymal echogenicity. Portal vein is patent on color Doppler imaging with normal direction of blood flow towards the liver. Other: None. IMPRESSION: Gallstones and sludge noted within the gallbladder. Gallbladder wall thickness is upper limits of normal at 3 mm. Positive sonographic Murphy's sign. Cannot exclude acute cholecystitis. Electronically Signed   By: Waddell Calk M.D.   On: 07/01/2024 08:56     Scheduled Meds:  [MAR Hold] amantadine   200 mg Oral BID   [MAR Hold] amLODipine   5 mg Oral Daily   [MAR Hold] citalopram   40 mg Oral Daily   [MAR Hold] enoxaparin  (LOVENOX ) injection  40 mg Subcutaneous Q24H   [MAR Hold] mupirocin  ointment  1 Application Nasal BID   Continuous Infusions:  sodium chloride  75 mL/hr at 07/01/24 1509   lactated ringers  10 mL/hr at 07/02/24 1040   [MAR Hold] piperacillin -tazobactam       LOS: 1 day    Time spent:    Sigurd Pac, MD [facility]   07/02/2024, 11:12 AM

## 2024-07-02 NOTE — Progress Notes (Signed)
 Progress Note  * Day of Surgery *  Interval: Still having pain. Tbili rising  Objective: Vital signs in last 24 hours: Temp:  [97.6 F (36.4 C)-98 F (36.7 C)] 97.8 F (36.6 C) (07/20 0838) Pulse Rate:  [64-78] 72 (07/20 0838) Resp:  [16-25] 16 (07/20 0838) BP: (123-142)/(83-96) 130/87 (07/20 0838) SpO2:  [94 %-100 %] 95 % (07/20 0838) Last BM Date : 06/30/24  Intake/Output from previous day: No intake/output data recorded. Intake/Output this shift: No intake/output data recorded.  PE: Gen: NAD CV: RRR, normotensive Pulm: NWOB on room air Abd: Soft, nondistended, TTP in RUQ and epigastrium   Lab Results:  Recent Labs    07/01/24 1609 07/02/24 0804  WBC 7.7 8.5  HGB 14.8 14.6  HCT 42.4 42.1  PLT 438* 399   BMET Recent Labs    07/01/24 0756 07/01/24 1609 07/02/24 0804  NA 138  --  137  K 4.0  --  3.9  CL 102  --  103  CO2 24  --  21*  GLUCOSE 146*  --  115*  BUN 14  --  8  CREATININE 0.68 0.54 0.57  CALCIUM  10.8*  --  9.0   PT/INR No results for input(s): LABPROT, INR in the last 72 hours. CMP     Component Value Date/Time   NA 137 07/02/2024 0804   K 3.9 07/02/2024 0804   CL 103 07/02/2024 0804   CO2 21 (L) 07/02/2024 0804   GLUCOSE 115 (H) 07/02/2024 0804   BUN 8 07/02/2024 0804   CREATININE 0.57 07/02/2024 0804   CREATININE 0.43 (L) 12/05/2014 1148   CALCIUM  9.0 07/02/2024 0804   PROT 6.7 07/02/2024 0804   ALBUMIN 3.9 07/02/2024 0804   AST 258 (H) 07/02/2024 0804   ALT 526 (H) 07/02/2024 0804   ALKPHOS 177 (H) 07/02/2024 0804   BILITOT 2.7 (H) 07/02/2024 0804   GFRNONAA >60 07/02/2024 0804   GFRAA >60 09/12/2019 2129   Lipase     Component Value Date/Time   LIPASE 36 07/01/2024 0756       Studies/Results: US  Abdomen Limited RUQ (LIVER/GB) Result Date: 07/01/2024 CLINICAL DATA:  Epigastric pain with diarrhea EXAM: ULTRASOUND ABDOMEN LIMITED RIGHT UPPER QUADRANT COMPARISON:  None Available. FINDINGS: Gallbladder: The  gallbladder wall measures 3 mm in thickness. Gallstones are identified which measure up to 4 mm. Sludge noted within the gallbladder. No pericholecystic fluid. Positive sonographic Murphy's sign. Common bile duct: Diameter: 2 mm Liver: No focal lesion identified. Within normal limits in parenchymal echogenicity. Portal vein is patent on color Doppler imaging with normal direction of blood flow towards the liver. Other: None. IMPRESSION: Gallstones and sludge noted within the gallbladder. Gallbladder wall thickness is upper limits of normal at 3 mm. Positive sonographic Murphy's sign. Cannot exclude acute cholecystitis. Electronically Signed   By: Waddell Calk M.D.   On: 07/01/2024 08:56     Assessment/Plan 34 yo F with cholelithiasis with persistent pain, hyperbilirubinemia  LOS: 1 day   - Will proceed to OR for laparoscopic cholecystectomy with ICG and IOC - We discussed the etiology of patient's pain, we discussed treatment options and recommended surgery. We discussed details of surgery including general anesthesia, laparoscopic approach, identification of cystic duct and common bile duct. Ligation of cystic duct and cystic artery. Possible need for intraoperative cholangiogram, open procedure, and subtotal cholecystectomy. Possible risks of common bile duct injury, injury to surrounding structures, bile leak, bleeding, infection, diarrhea, retained stone and hernia. The patient showed good  understanding and all questions were answered   I reviewed ED provider notes, hospitalist notes, last 24 h vitals and pain scores, last 48 h intake and output, last 24 h labs and trends, and last 24 h imaging results.  This care required high  level of medical decision making.    Richerd Silversmith, MD [facility] Surgery 07/02/2024, 10:00 AM Please see Amion for pager number during day hours 7:00am-4:30pm

## 2024-07-02 NOTE — Plan of Care (Signed)
   Problem: Education: Goal: Knowledge of General Education information will improve Description Including pain rating scale, medication(s)/side effects and non-pharmacologic comfort measures Outcome: Progressing

## 2024-07-02 NOTE — Op Note (Signed)
 07/02/2024 1:17 PM  PATIENT: Tina Mckee  33 y.o. female  Patient Care Team: Panosh, Apolinar POUR, MD as PCP - General Hickling, Elsie DEL, MD (Inactive) (Neurology) Barbarann Oneil BROCKS, MD (Inactive) (Orthopedic Surgery) Leva Rush, MD as Consulting Physician (Obstetrics and Gynecology) Parrett, Madelin RAMAN, NP as Nurse Practitioner (Pulmonary Disease) Skeet Juliene SAUNDERS, DO as Consulting Physician (Neurology) Hope Almarie ORN, NP as Nurse Practitioner (Pulmonary Disease)  PRE-OPERATIVE DIAGNOSIS: cholelithiasis, possible choledocholithiasis  POST-OPERATIVE DIAGNOSIS: cholelithiasis, choledocholithiasis  PROCEDURE: laparoscopic cholecystectomy with intraoperative cholangiogram  SURGEON: Orie Silversmith, MD  ASSISTANT: None  ANESTHESIA: General endotracheal  EBL: 10cc  DRAINS: None  SPECIMEN: Gallbladder  COUNTS: Sponge, needle and instrument counts were reported correct x2 at the conclusion of the operation  DISPOSITION: PACU in satisfactory condition  COMPLICATIONS: Cholangiogram images showing filling defect not saved  FINDINGS: Inflamed, distended, and edematous gallbladder. Cholangiogram with filling defect and contrast not extending into small intestine  DESCRIPTION:  Preoperative indocyanine green  was administered in preoperative holding. The patient was identified & brought into the operating room. She was then positioned supine on the OR table. SCDs were in place and active during the entire case. She then underwent general endotracheal anesthesia. Pressure points were padded. Hair on the abdomen was clipped by the OR team. The abdomen was prepped and draped in the standard sterile fashion. Antibiotics were administered. A surgical timeout was performed and confirmed our plan.  A small incision was made in the LUQ at Palmer's point and a veress needle was inserted. Air was aspirated and subsequent positive drop test. Abdomen then insufflated to . A periumbilical incision was  then made and a 5mm trocar optiview using a 30 degree scope was inserted and the abdomen was entered under direct visualization. Inspection confirmed no evidence of trocar or veress site complications. The veress was then removed.    The patient was then positioned in reverse Trendelenburg with slight left side down. A 12 mm supxiphoid trocar was placed under direct visualization and  two additional 5mm trocars were placed along the right subcostal line - one 5mm port in mid subcostal region, another 5mm port in the right flank near the anterior axillary line.  The liver and gallbladder were inspected. The gallbladder fundus was unable to be grasped due to distension so a needle suction used to decompress the gallbladder. The fundus was then and elevated cephalad. An additional grasper was then placed on the infundibulum of the gallbladder and the infundibulum was retracted laterally. Staying high on the gallbladder, the peritoneum on both sides of the gallbladder was opened with hook cautery. Gentle blunt dissection was then employed with a [place]  dissector working down into [employer]. The cystic duct was identified and carefully circumferentially dissected. The cystic artery was also identified and carefully circumferentially dissected. The space between the cystic artery and hepatocystic plate was developed such that a good view of the liver could be seen through a window medial to the cystic artery. The triangle of Calot had been cleared of all fibrofatty tissue. At this point, a critical view of safety was achieved and the only structures visualized was the skeletonized cystic duct laterally, the skeletonized cystic artery and the liver through the window medial to the artery. There was a posterior cystic artery was noted.  Attention turned to infrared fluorescent cholangiography but given bile spillage from initial decompression this proved to not be helpful.  At this point attention was  turned to performing a cholangiogram. A kumar clamp was placed  on the infundibulum of the gallbladder and catheter inserted into the clamp and carefully the needle inserted into the cystic duct. A cholangiogram was then obtained using a dilute radio-opaque contrast and continuous fluoroscopy. There was contrast flow from a side branch consistent with cystic duct cannulization. Contrast flowed proximally up the common hepatic duct into the right and left intrahepatic chains out to secondary radicals. Contrast flowed distally into the common bile duct but there was a filling defect seen and did not see contrast flow into intestine. Unfortunately the filling defect image was not saved into the chart. This was consistent with an abnormal cholangiogram. The cholangiocatheter was then removed.  The cystic duct and artery were clipped with 2 hemolock clips on the patient side and 1 clip on the specimen side. The cystic duct and artery were then divided. The posterior cystic artery was clipped with two clips on patient side and divided with electrocautery above. There were some large venous vessels on the gallbladder wall high on the gallbladder that were clipped with 5mm metal clips to prevent bleeding obscuring dissection plane view. The gallbladder was then freed from its remaining attachments to the liver using electrocautery and placed into an endocatch bag. The RUQ was gently irrigated with sterile saline. Hemostasis was then verified. The clips were in good position; the gallbladder fossa was dry. The rest of the abdomen was inspected no injury nor bleeding elsewhere was identified.  The endocatch bag containing the gallbladder was then removed from the subxiphoid port site and passed off as specimen. The subxiphoid port fascia was then closed in a figured of eight fashion with 0 vicryl using a suture passer. The RUQ ports were removed under direct visualization and noted to be hemostatic.SABRA The fascia was  palpated and noted to be completely closed. The abdomen was then desufflated and the periumbilical trocar removed. The skin of all incision sites was approximated with 4-0 monocryl subcuticular suture and dermabond applied.  was then awakened from anesthesia, extubated, and transferred to a stretcher for transport to PACU in satisfactory condition.  Instrument, sponge, and needle counts were correct at closure and at the conclusion of the case.   Orie Silversmith, MD [facility] Surgery

## 2024-07-02 NOTE — Anesthesia Procedure Notes (Signed)
 Procedure Name: Intubation Date/Time: 07/02/2024 10:48 AM  Performed by: Boyce Shilling, CRNAPre-anesthesia Checklist: Patient identified, Emergency Drugs available, Suction available, Timeout performed and Patient being monitored Patient Re-evaluated:Patient Re-evaluated prior to induction Oxygen Delivery Method: Circle system utilized Preoxygenation: Pre-oxygenation with 100% oxygen Induction Type: IV induction Ventilation: Mask ventilation without difficulty Laryngoscope Size: Mac and 3 Grade View: Grade I Tube type: Oral Tube size: 7.0 mm Number of attempts: 1 Airway Equipment and Method: Stylet Placement Confirmation: ETT inserted through vocal cords under direct vision, positive ETCO2, CO2 detector and breath sounds checked- equal and bilateral Secured at: 22 cm Tube secured with: Tape Dental Injury: Teeth and Oropharynx as per pre-operative assessment

## 2024-07-02 NOTE — Transfer of Care (Signed)
 Immediate Anesthesia Transfer of Care Note  Patient: Tina Mckee  Procedure(s) Performed: LAPAROSCOPIC CHOLECYSTECTOMY WITH INTRAOPERATIVE CHOLANGIOGRAM  Patient Location: PACU  Anesthesia Type:General  Level of Consciousness: awake and alert   Airway & Oxygen Therapy: Patient Spontanous Breathing and Patient connected to face mask oxygen  Post-op Assessment: Report given to RN and Post -op Vital signs reviewed and stable  Post vital signs: Reviewed and stable  Last Vitals:  Vitals Value Taken Time  BP 146/85 07/02/24 13:00  Temp 36.8 C 07/02/24 12:58  Pulse 88 07/02/24 13:06  Resp 19 07/02/24 13:05  SpO2 90 % 07/02/24 13:06  Vitals shown include unfiled device data.  Last Pain:  Vitals:   07/02/24 1258  TempSrc:   PainSc: Asleep         Complications: No notable events documented.

## 2024-07-03 ENCOUNTER — Encounter (HOSPITAL_COMMUNITY): Payer: Self-pay | Admitting: General Surgery

## 2024-07-03 ENCOUNTER — Other Ambulatory Visit: Payer: Self-pay

## 2024-07-03 DIAGNOSIS — K819 Cholecystitis, unspecified: Secondary | ICD-10-CM | POA: Diagnosis not present

## 2024-07-03 LAB — COMPREHENSIVE METABOLIC PANEL WITH GFR
ALT: 383 U/L — ABNORMAL HIGH (ref 0–44)
AST: 110 U/L — ABNORMAL HIGH (ref 15–41)
Albumin: 3.8 g/dL (ref 3.5–5.0)
Alkaline Phosphatase: 170 U/L — ABNORMAL HIGH (ref 38–126)
Anion gap: 10 (ref 5–15)
BUN: 5 mg/dL — ABNORMAL LOW (ref 6–20)
CO2: 26 mmol/L (ref 22–32)
Calcium: 9.3 mg/dL (ref 8.9–10.3)
Chloride: 102 mmol/L (ref 98–111)
Creatinine, Ser: 0.52 mg/dL (ref 0.44–1.00)
GFR, Estimated: >60 mL/min (ref >60)
Glucose, Bld: 137 mg/dL — ABNORMAL HIGH (ref 70–99)
Potassium: 3.8 mmol/L (ref 3.5–5.1)
Sodium: 138 mmol/L (ref 135–145)
Total Bilirubin: 1.7 mg/dL — ABNORMAL HIGH (ref 0.0–1.2)
Total Protein: 6.6 g/dL (ref 6.5–8.1)

## 2024-07-03 LAB — CBC
HCT: 41.8 % (ref 36.0–46.0)
Hemoglobin: 14.3 g/dL (ref 12.0–15.0)
MCH: 30 pg (ref 26.0–34.0)
MCHC: 34.2 g/dL (ref 30.0–36.0)
MCV: 87.8 fL (ref 80.0–100.0)
Platelets: 463 K/uL — ABNORMAL HIGH (ref 150–400)
RBC: 4.76 MIL/uL (ref 3.87–5.11)
RDW: 11.7 % (ref 11.5–15.5)
WBC: 16.1 K/uL — ABNORMAL HIGH (ref 4.0–10.5)
nRBC: 0 % (ref 0.0–0.2)

## 2024-07-03 MED ORDER — ACETAMINOPHEN 500 MG PO TABS: 1000.0000 mg | ORAL_TABLET | Freq: Four times a day (QID) | ORAL | Status: AC | PRN

## 2024-07-03 MED ORDER — OXYCODONE HCL 5 MG PO TABS: 5.0000 mg | ORAL_TABLET | Freq: Four times a day (QID) | ORAL | 0 refills | Status: DC | PRN

## 2024-07-03 NOTE — Anesthesia Postprocedure Evaluation (Signed)
 Anesthesia Post Note  Patient: Tina Mckee  Procedure(s) Performed: LAPAROSCOPIC CHOLECYSTECTOMY WITH INTRAOPERATIVE CHOLANGIOGRAM     Patient location during evaluation: PACU Anesthesia Type: General Level of consciousness: sedated and patient cooperative Pain management: pain level controlled Vital Signs Assessment: post-procedure vital signs reviewed and stable Respiratory status: spontaneous breathing Cardiovascular status: stable Anesthetic complications: no   No notable events documented.  Last Vitals:  Vitals:   07/03/24 0750 07/03/24 1540  BP: 129/84 129/80  Pulse: 85 83  Resp: 16 16  Temp: (!) 36.4 C 36.6 C  SpO2: 95% 96%    Last Pain:  Vitals:   07/03/24 1540  TempSrc: Oral  PainSc:                  Norleen Pope

## 2024-07-03 NOTE — Plan of Care (Signed)

## 2024-07-03 NOTE — Discharge Instructions (Signed)

## 2024-07-03 NOTE — Progress Notes (Signed)
 PROGRESS NOTE    Tina Mckee  FMW:[id number] DOB: 13-Feb-1991 DOA: 07/01/2024 PCP: Charlett Apolinar POUR, MD   33/F with history of perinatal stroke, residual right hemiparesis, obesity, hypertension, dyslipidemia developed severe epigastric/right-sided abdominal pain around 1 AM this morning which woke her up, subsequently tried some Pepto-Bismol followed by Tums without any relief, symptoms persisted through the morning, also noticed some chills, eventually presented to [facility] ER ED Course: Hypertensive, other vital stable, labs noted platelets 431, creatinine 0.6, WBC 8, urine pregnancy negative, UA normal, abdominal ultrasound with gallstones, gallbladder sludge, gallbladder wall thickness at upper limit of normal, positive sonographic Murphy's sign, EDP consulted general surgery who recommended admission per [facility]     Subjective: Feels fair, mild right-sided abdominal pain  Assessment and Plan:  Cholecystitis Cholelithiasis -Elevated LFTs, right upper quadrant tenderness on exam - RUQ ultrasound noted gallstones, sludge, gallbladder wall thickness is upper limits of normal, positive sonographic Murphy's - General Surgery following, sp Lap chole yesterday -MRCP without ductal dilation or filling defects in CBD, lFTS improving -advanced diet, CMET in am   History of perinatal stroke Residual right hemiparesis Tremors, continue amantadine    Hypertension - Stable, continue amlodipine    Hyperglycemia - Recent A1c was 5.3   Obesity - About to start GLP-1 agonist, has not started yet     DVT prophylaxis: Lovenox  Code Status: full code Family Communication: Disposition Plan: Home tomorrow  Consultants:    Procedures:   Antimicrobials:    Objective: Vitals:   07/02/24 1411 07/02/24 2010 07/03/24 0500 07/03/24 0750  BP: (!) 142/88 130/79 120/76 129/84  Pulse: 73 84 81 85  Resp: 15 16 16 16   Temp:  98 F (36.7 C) 97.7 F (36.5 C) (!) 97.5 F (36.4 C)   TempSrc:  Oral Oral Oral  SpO2: 94% 100% 96% 95%  Weight:      Height:        Intake/Output Summary (Last 24 hours) at 07/03/2024 1342 Last data filed at 07/03/2024 0500 Gross per 24 hour  Intake 820 ml  Output --  Net 820 ml   Filed Weights   07/01/24 0747  Weight: 106.6 kg    Examination:  General exam: Appears calm and comfortable  Respiratory system: Clear to auscultation Cardiovascular system: S1 & S2 heard, RRR.  Abd: Obese, mild right-sided tenderness, bowel sounds decreased Central nervous system: Right hemiparesis Extremities: no edema Skin: No rashes Psychiatry:  Mood & affect appropriate.     Data Reviewed:   CBC: Recent Labs  Lab 07/01/24 0756 07/01/24 1609 07/02/24 0804 07/03/24 0721  WBC 8.8 7.7 8.5 16.1*  HGB 14.4 14.8 14.6 14.3  HCT 41.1 42.4 42.1 41.8  MCV 85.4 86.2 86.3 87.8  PLT 431* 438* 399 463*   Basic Metabolic Panel: Recent Labs  Lab 07/01/24 0756 07/01/24 1609 07/02/24 0804 07/03/24 0721  NA 138  --  137 138  K 4.0  --  3.9 3.8  CL 102  --  103 102  CO2 24  --  21* 26  GLUCOSE 146*  --  115* 137*  BUN 14  --  8 5*  CREATININE 0.68 0.54 0.57 0.52  CALCIUM  10.8*  --  9.0 9.3   GFR: Estimated Creatinine Clearance: 110.5 mL/min (by C-G formula based on SCr of 0.52 mg/dL). Liver Function Tests: Recent Labs  Lab 07/01/24 0756 07/02/24 0804 07/03/24 0721  AST 394* 258* 110*  ALT 322* 526* 383*  ALKPHOS 178* 177* 170*  BILITOT 2.1* 2.7* 1.7*  PROT 7.2 6.7 6.6  ALBUMIN 4.6 3.9 3.8   Recent Labs  Lab 07/01/24 0756  LIPASE 36   No results for input(s): AMMONIA in the last 168 hours. Coagulation Profile: No results for input(s): INR, PROTIME in the last 168 hours. Cardiac Enzymes: No results for input(s): CKTOTAL, CKMB, CKMBINDEX, TROPONINI in the last 168 hours. BNP (last 3 results) No results for input(s): PROBNP in the last 8760 hours. HbA1C: No results for input(s): HGBA1C in the last 72  hours. CBG: No results for input(s): GLUCAP in the last 168 hours. Lipid Profile: No results for input(s): CHOL, HDL, LDLCALC, TRIG, CHOLHDL, LDLDIRECT in the last 72 hours. Thyroid  Function Tests: No results for input(s): TSH, T4TOTAL, FREET4, T3FREE, THYROIDAB in the last 72 hours. Anemia Panel: No results for input(s): VITAMINB12, FOLATE, FERRITIN, TIBC, IRON, RETICCTPCT in the last 72 hours. Urine analysis:    Component Value Date/Time   COLORURINE YELLOW 07/01/2024 0944   APPEARANCEUR CLEAR 07/01/2024 0944   LABSPEC 1.011 07/01/2024 0944   PHURINE 6.5 07/01/2024 0944   GLUCOSEU NEGATIVE 07/01/2024 0944   HGBUR NEGATIVE 07/01/2024 0944   BILIRUBINUR NEGATIVE 07/01/2024 0944   BILIRUBINUR neg 01/11/2019 1659   BILIRUBINUR Negative 11/19/2013 0000   KETONESUR NEGATIVE 07/01/2024 0944   PROTEINUR NEGATIVE 07/01/2024 0944   UROBILINOGEN 0.2 01/11/2019 1659   UROBILINOGEN 0.2 12/12/2014 1105   NITRITE NEGATIVE 07/01/2024 0944   LEUKOCYTESUR NEGATIVE 07/01/2024 0944   Sepsis Labs: @LABRCNTIP (procalcitonin:4,lacticidven:4)  ) Recent Results (from the past 240 hours)  Surgical PCR screen     Status: None   Collection Time: 07/01/24  8:38 PM   Specimen: Nasal Mucosa; Nasal Swab  Result Value Ref Range Status   MRSA, PCR NEGATIVE NEGATIVE Final   Staphylococcus aureus NEGATIVE NEGATIVE Final    Comment: (NOTE) The Xpert SA Assay (FDA approved for NASAL specimens in patients 22 years of age and older), is one component of a comprehensive surveillance program. It is not intended to diagnose infection nor to guide or monitor treatment. Performed at [facility], [address] [place], [place] [address]      Radiology Studies: MR ABDOMEN WITH MRCP W CONTRAST Result Date: 07/03/2024 CLINICAL DATA:  Choledocholithiasis. Status post cholecystectomy. Report a filling defect in the distal common bile duct in the operative note. EXAM: MRI  ABDOMEN WITH CONTRAST (WITH MRCP) TECHNIQUE: Multiplanar multisequence MR imaging of the abdomen was performed following the administration of intravenous contrast. Heavily T2-weighted images of the biliary and pancreatic ducts were obtained, and three-dimensional MRCP images were rendered by post processing. CONTRAST:  10mL GADAVIST  GADOBUTROL  1 MMOL/ML IV SOLN COMPARISON:  Ultrasound exam 07/01/2024 FINDINGS: Lower chest: Dependent atelectasis. Hepatobiliary: No suspicious focal abnormality within the liver parenchyma. Gallbladder surgically absent. Minimal heterogeneous signal intensity and trace fluid in the gallbladder fossa are compatible with the cholecystectomy. No intra or extrahepatic biliary duct dilatation. Patient was sedated and had difficulty staying awake and alert under to participate and breath holding. As such, MRCP images are nondiagnostic. T2 weighted axial and coronal non MRCP imaging shows no substantial intrahepatic biliary duct dilatation. The common bile duct measures 6 mm diameter. No definite filling defect within the common bile duct on axial T2 fat-suppressed and T2 haste imaging. Pancreas: No focal mass lesion. No dilatation of the main duct. No intraparenchymal cyst. No peripancreatic edema. Spleen:  No splenomegaly. No suspicious focal mass lesion. Adrenals/Urinary Tract: No adrenal nodule or mass. Scattered tiny T2 hyperintensities identified in both kidneys, too  small to characterize, but statistically most likely cortical cyst. No hydronephrosis. Stomach/Bowel: Stomach is unremarkable. No gastric wall thickening. No evidence of outlet obstruction. Duodenum is normally positioned as is the ligament of Treitz. No small bowel or colonic dilatation within the visualized abdomen. Vascular/Lymphatic: No abdominal aortic aneurysm. No abdominal lymphadenopathy. Other:  No substantial intraperitoneal free fluid. Musculoskeletal: No focal suspicious marrow enhancement within the visualized  bony anatomy. IMPRESSION: 1. Status post cholecystectomy with minimal heterogeneous signal intensity and trace fluid in the gallbladder fossa, compatible with the cholecystectomy. 2. No intra or extrahepatic biliary duct dilatation. No definite filling defect within the common bile duct on axial T2 fat-suppressed and T2 haste imaging. MRCP images are nondiagnostic due to patient's inability to stay alert and participate in breath holding. 3. Scattered tiny T2 hyperintensities in both kidneys, too small to characterize, but statistically most likely cortical cysts. Electronically Signed   By: Camellia Candle M.D.   On: 07/03/2024 05:19   DG Cholangiogram Operative Result Date: 07/02/2024 CLINICAL DATA:  Laparoscopic cholecystectomy EXAM: INTRAOPERATIVE CHOLANGIOGRAM TECHNIQUE: Cholangiographic images from the C-arm fluoroscopic device were submitted for interpretation post-operatively. Please see the procedural report for the amount of contrast and the fluoroscopy time utilized. FLUOROSCOPY: Radiation Exposure Index (as provided by the fluoroscopic device): 13.82 mGy Kerma COMPARISON:  Ultrasound right upper quadrant preop 07/01/2024 FINDINGS: Single fluoroscopic spot image submitted for review demonstrates surgical instruments with catheter overlying the cystic duct remnant which is opacified. There is opacification of the common duct and the central intrahepatic ducts without obvious dilatation or filling defect on this limited single view. Please correlate with real-time intraoperative fluoroscopy. Imaging was obtained to aid in treatment. IMPRESSION: Intraoperative cholangiogram after cholecystectomy Electronically Signed   By: Ranell Bring M.D.   On: 07/02/2024 12:42   DG C-Arm 1-60 Min-No Report Result Date: 07/02/2024 Fluoroscopy was utilized by the requesting physician.  No radiographic interpretation.     Scheduled Meds:  acetaminophen   1,000 mg Oral Q6H   Or   acetaminophen   650 mg Rectal Q6H    amantadine   200 mg Oral BID   amLODipine   5 mg Oral Daily   citalopram   40 mg Oral Daily   enoxaparin  (LOVENOX ) injection  40 mg Subcutaneous Q24H   mupirocin  ointment  1 Application Nasal BID   Continuous Infusions:     LOS: 2 days    Time spent:    Sigurd Pac, MD [facility]   07/03/2024, 1:42 PM

## 2024-07-03 NOTE — Progress Notes (Signed)
 Progress Note  1 Day Post-Op  Subjective: Pain is well controlled, endorses flatus but denies having a BM. Tolerating her clear diet well. Patient stated that she would like to keep the diet the way it is for now.   Objective: Vital signs in last 24 hours: Temp:  [97.5 F (36.4 C)-98.2 F (36.8 C)] 97.5 F (36.4 C) (07/21 0750) Pulse Rate:  [72-86] 85 (07/21 0750) Resp:  [14-27] 16 (07/21 0750) BP: (120-154)/(76-92) 129/84 (07/21 0750) SpO2:  [92 %-100 %] 95 % (07/21 0750) Last BM Date : 06/30/24  Intake/Output from previous day: 07/20 0701 - 07/21 0700 In: 1870 [P.O.:820; I.V.:1000; IV Piggyback:50] Out: 5 [Blood:5] Intake/Output this shift: No intake/output data recorded.  PE: Gen: NAD CV: RRR, normotensive Pulm: NWOB on room air Abd: Soft, nondistended, mild TTP around incisions. Incisions C/D/I without evidence of cellulitis.     Lab Results:  Recent Labs    07/02/24 0804 07/03/24 0721  WBC 8.5 16.1*  HGB 14.6 14.3  HCT 42.1 41.8  PLT 399 463*   BMET Recent Labs    07/01/24 0756 07/01/24 1609 07/02/24 0804  NA 138  --  137  K 4.0  --  3.9  CL 102  --  103  CO2 24  --  21*  GLUCOSE 146*  --  115*  BUN 14  --  8  CREATININE 0.68 0.54 0.57  CALCIUM  10.8*  --  9.0   PT/INR No results for input(s): LABPROT, INR in the last 72 hours. CMP     Component Value Date/Time   NA 137 07/02/2024 0804   K 3.9 07/02/2024 0804   CL 103 07/02/2024 0804   CO2 21 (L) 07/02/2024 0804   GLUCOSE 115 (H) 07/02/2024 0804   BUN 8 07/02/2024 0804   CREATININE 0.57 07/02/2024 0804   CREATININE 0.43 (L) 12/05/2014 1148   CALCIUM  9.0 07/02/2024 0804   PROT 6.7 07/02/2024 0804   ALBUMIN 3.9 07/02/2024 0804   AST 258 (H) 07/02/2024 0804   ALT 526 (H) 07/02/2024 0804   ALKPHOS 177 (H) 07/02/2024 0804   BILITOT 2.7 (H) 07/02/2024 0804   GFRNONAA >60 07/02/2024 0804   GFRAA >60 09/12/2019 2129   Lipase     Component Value Date/Time   LIPASE 36 07/01/2024  0756       Studies/Results: MR ABDOMEN WITH MRCP W CONTRAST Result Date: 07/03/2024 CLINICAL DATA:  Choledocholithiasis. Status post cholecystectomy. Report a filling defect in the distal common bile duct in the operative note. EXAM: MRI ABDOMEN WITH CONTRAST (WITH MRCP) TECHNIQUE: Multiplanar multisequence MR imaging of the abdomen was performed following the administration of intravenous contrast. Heavily T2-weighted images of the biliary and pancreatic ducts were obtained, and three-dimensional MRCP images were rendered by post processing. CONTRAST:  10mL GADAVIST  GADOBUTROL  1 MMOL/ML IV SOLN COMPARISON:  Ultrasound exam 07/01/2024 FINDINGS: Lower chest: Dependent atelectasis. Hepatobiliary: No suspicious focal abnormality within the liver parenchyma. Gallbladder surgically absent. Minimal heterogeneous signal intensity and trace fluid in the gallbladder fossa are compatible with the cholecystectomy. No intra or extrahepatic biliary duct dilatation. Patient was sedated and had difficulty staying awake and alert under to participate and breath holding. As such, MRCP images are nondiagnostic. T2 weighted axial and coronal non MRCP imaging shows no substantial intrahepatic biliary duct dilatation. The common bile duct measures 6 mm diameter. No definite filling defect within the common bile duct on axial T2 fat-suppressed and T2 haste imaging. Pancreas: No focal mass lesion. No dilatation of  the main duct. No intraparenchymal cyst. No peripancreatic edema. Spleen:  No splenomegaly. No suspicious focal mass lesion. Adrenals/Urinary Tract: No adrenal nodule or mass. Scattered tiny T2 hyperintensities identified in both kidneys, too small to characterize, but statistically most likely cortical cyst. No hydronephrosis. Stomach/Bowel: Stomach is unremarkable. No gastric wall thickening. No evidence of outlet obstruction. Duodenum is normally positioned as is the ligament of Treitz. No small bowel or colonic  dilatation within the visualized abdomen. Vascular/Lymphatic: No abdominal aortic aneurysm. No abdominal lymphadenopathy. Other:  No substantial intraperitoneal free fluid. Musculoskeletal: No focal suspicious marrow enhancement within the visualized bony anatomy. IMPRESSION: 1. Status post cholecystectomy with minimal heterogeneous signal intensity and trace fluid in the gallbladder fossa, compatible with the cholecystectomy. 2. No intra or extrahepatic biliary duct dilatation. No definite filling defect within the common bile duct on axial T2 fat-suppressed and T2 haste imaging. MRCP images are nondiagnostic due to patient's inability to stay alert and participate in breath holding. 3. Scattered tiny T2 hyperintensities in both kidneys, too small to characterize, but statistically most likely cortical cysts. Electronically Signed   By: Camellia Candle M.D.   On: 07/03/2024 05:19   DG Cholangiogram Operative Result Date: 07/02/2024 CLINICAL DATA:  Laparoscopic cholecystectomy EXAM: INTRAOPERATIVE CHOLANGIOGRAM TECHNIQUE: Cholangiographic images from the C-arm fluoroscopic device were submitted for interpretation post-operatively. Please see the procedural report for the amount of contrast and the fluoroscopy time utilized. FLUOROSCOPY: Radiation Exposure Index (as provided by the fluoroscopic device): 13.82 mGy Kerma COMPARISON:  Ultrasound right upper quadrant preop 07/01/2024 FINDINGS: Single fluoroscopic spot image submitted for review demonstrates surgical instruments with catheter overlying the cystic duct remnant which is opacified. There is opacification of the common duct and the central intrahepatic ducts without obvious dilatation or filling defect on this limited single view. Please correlate with real-time intraoperative fluoroscopy. Imaging was obtained to aid in treatment. IMPRESSION: Intraoperative cholangiogram after cholecystectomy Electronically Signed   By: Ranell Bring M.D.   On: 07/02/2024  12:42   DG C-Arm 1-60 Min-No Report Result Date: 07/02/2024 Fluoroscopy was utilized by the requesting physician.  No radiographic interpretation.   US  Abdomen Limited RUQ (LIVER/GB) Result Date: 07/01/2024 CLINICAL DATA:  Epigastric pain with diarrhea EXAM: ULTRASOUND ABDOMEN LIMITED RIGHT UPPER QUADRANT COMPARISON:  None Available. FINDINGS: Gallbladder: The gallbladder wall measures 3 mm in thickness. Gallstones are identified which measure up to 4 mm. Sludge noted within the gallbladder. No pericholecystic fluid. Positive sonographic Murphy's sign. Common bile duct: Diameter: 2 mm Liver: No focal lesion identified. Within normal limits in parenchymal echogenicity. Portal vein is patent on color Doppler imaging with normal direction of blood flow towards the liver. Other: None. IMPRESSION: Gallstones and sludge noted within the gallbladder. Gallbladder wall thickness is upper limits of normal at 3 mm. Positive sonographic Murphy's sign. Cannot exclude acute cholecystitis. Electronically Signed   By: Waddell Calk M.D.   On: 07/01/2024 08:56     Assessment/Plan POD 1- S/P laparoscopic cholecystectomy with intraoperative cholangiogram, 7/20, Dr. Ann   -Multimodal pain management -Clear liquid diet, advance as tolerated. Patient stated that she does not want her diet advanced at this time. Explained that we can advance diet whenever she is ready but it is important for diet advancement in the recovery process.  -IVF  -Monitor CBC, LFTs,  -Monitor for post-op ileus   FEN - CLD, ADAT VTE - SCDs, Lovenox  ID- Zosyn  given in OR  Sleep apnea- Asthma-    I reviewed ED provider notes, hospitalist notes, last  24 h vitals and pain scores, last 48 h intake and output, last 24 h labs and trends, and last 24 h imaging results.    Eulah Hammonds, [facility] 07/03/2024, 7:50 AM Please see Amion for pager number during day hours 7:00am-4:30pm

## 2024-07-03 NOTE — Discharge Summary (Signed)
 Physician Discharge Summary  Tina Mckee FMW:[id number] DOB: Apr 09, 1991 DOA: 07/01/2024  PCP: Charlett Apolinar POUR, MD  Admit date: 07/01/2024 Discharge date: 07/03/2024  Time spent: 45 minutes  Recommendations for Outpatient Follow-up:  CMP on Thursday [facility]CS in 1 week for post op check   Discharge Diagnoses:  Principal Problem:   Cholecystitis   Calculous cholelithiases   Hemiparesis, right (HCC)   High blood triglycerides   Obesity (BMI 30-39.9)   History of stroke   Calculous cholecystitis   Discharge Condition: improved  Diet recommendation: regular  Filed Weights   07/01/24 0747  Weight: 106.6 kg    History of present illness:  33/F with history of perinatal stroke, residual right hemiparesis, obesity, hypertension, dyslipidemia developed severe epigastric/right-sided abdominal pain around 1 AM this morning which woke her up, subsequently tried some Pepto-Bismol followed by Tums without any relief, symptoms persisted through the morning, also noticed some chills, eventually presented to [facility] ER ED Course: Hypertensive, other vital stable, labs noted platelets 431, creatinine 0.6, WBC 8, urine pregnancy negative, UA normal, abdominal ultrasound with gallstones, gallbladder sludge, gallbladder wall thickness at upper limit of normal, positive sonographic Murphy's sign, EDP consulted general surgery who recommended admission per [facility]  Hospital Course:   Cholecystitis Cholelithiasis -Elevated LFTs, right upper quadrant tenderness on exam - RUQ ultrasound noted gallstones, sludge, gallbladder wall thickness is upper limits of normal, positive sonographic Murphy's - General Surgery following, sp Lap chole yesterday -MRCP without ductal dilation or filling defects in CBD, LFTS improving - Case reviewed with gastroenterology by general surgery, no current plans for ERCP, felt to be stable for discharge home with repeat labs, LFTs with PCP in 1 week  History of  perinatal stroke Residual right hemiparesis Tremors, continue amantadine    Hypertension - Stable, continue amlodipine    Hyperglycemia - Recent A1c was 5.3   Obesity - About to start GLP-1 agonist, has not started yet  Discharge Exam: Vitals:   07/03/24 0750 07/03/24 1540  BP: 129/84 129/80  Pulse: 85 83  Resp: 16 16  Temp: (!) 97.5 F (36.4 C) 97.8 F (36.6 C)  SpO2: 95% 96%   Gen: Awake, Alert, Oriented X 3,  HEENT: no JVD Lungs: Good air movement bilaterally, CTAB CVS: S1S2/RRR Abd: soft, mildly tender, non distended, BS present, port sites Extremities: No edema Skin: no new rashes on exposed skin   Discharge Instructions   Discharge Instructions     Diet - low sodium heart healthy   Complete by: As directed    Discharge wound care:   Complete by: As directed    routine   Increase activity slowly   Complete by: As directed       Allergies as of 07/03/2024       Reactions   Cephalexin Rash, Itching   Vibramycin  [doxycycline ] Itching, Rash        Medication List     TAKE these medications    acetaminophen  500 MG tablet Commonly known as: TYLENOL  Take 2 tablets (1,000 mg total) by mouth every 6 (six) hours as needed for mild pain (pain score 1-3) or moderate pain (pain score 4-6).   amantadine  100 MG capsule Commonly known as: SYMMETREL  Take 2 capsules (200 mg total) by mouth 2 (two) times daily.   amLODipine  5 MG tablet Commonly known as: NORVASC  TAKE 1 TABLET(5 MG) BY MOUTH DAILY   betamethasone  (augmented) 0.05 % lotion Commonly known as: DIPROLENE  Apply 1 Application topically daily as needed (Irritation).   bismuth  subsalicylate 262 MG chewable tablet Commonly known as: PEPTO BISMOL Chew 524 mg by mouth as needed for indigestion.   calcium  carbonate 500 MG chewable tablet Commonly known as: TUMS - dosed in mg elemental calcium  Chew 1 tablet by mouth as needed for indigestion or heartburn.   Ciclopirox 1 % shampoo Apply 1  Application topically 3 (three) times a week.   citalopram  40 MG tablet Commonly known as: CELEXA  TAKE 1 TABLET(40 MG) BY MOUTH DAILY   fluticasone  50 MCG/ACT nasal spray Commonly known as: FLONASE  SHAKE LIQUID AND USE 1 SPRAY IN EACH NOSTRIL TWICE DAILY What changed: See the new instructions.   Mirena  (52 MG) 20 MCG/DAY Iud Generic drug: levonorgestrel  Provided by [facility]   ONE-A-DAY WOMENS PO Take 1 tablet by mouth daily.   oxyCODONE  5 MG immediate release tablet Commonly known as: Oxy IR/ROXICODONE  Take 1 tablet (5 mg total) by mouth every 6 (six) hours as needed for breakthrough pain.   ProAir  HFA 108 (90 Base) MCG/ACT inhaler Generic drug: albuterol  INHALE 1-2 PUFFS INTO THE LUNGS EVERY 6 (SIX) HOURS AS NEEDED FOR WHEEZING OR SHORTNESS OF BREATH.   VITAMIN E PO Take 1 tablet by mouth daily.   Zepbound  2.5 MG/0.5ML Pen Generic drug: tirzepatide  Inject 2.5 mg into the skin once a week.               Discharge Care Instructions  (From admission, onward)           Start     Ordered   07/03/24 0000  Discharge wound care:       Comments: routine   07/03/24 1549           Allergies  Allergen Reactions   Cephalexin Rash and Itching   Vibramycin  [Doxycycline ] Itching and Rash    Follow-up Information     Maczis, Puja Gosai, PA-C Follow up.   Specialty: General Surgery Why: Please call to confirm your appointment date and time. Please bring a copy of your photo ID, insurance card and arrive 30 minutes prior to your appointment for paperwork. Contact information: [address] [place] [address] [phone]         Charlett Apolinar POUR, MD Follow up on 07/06/2024.   Specialties: Internal Medicine, Pediatrics Why: For repeat LFT's Contact information: [address] [place] [place] [facility] [place] [address] [phone]                  The results of significant diagnostics from this hospitalization (including imaging,  microbiology, ancillary and laboratory) are listed below for reference.    Significant Diagnostic Studies: MR ABDOMEN WITH MRCP W CONTRAST Result Date: 07/03/2024 CLINICAL DATA:  Choledocholithiasis. Status post cholecystectomy. Report a filling defect in the distal common bile duct in the operative note. EXAM: MRI ABDOMEN WITH CONTRAST (WITH MRCP) TECHNIQUE: Multiplanar multisequence MR imaging of the abdomen was performed following the administration of intravenous contrast. Heavily T2-weighted images of the biliary and pancreatic ducts were obtained, and three-dimensional MRCP images were rendered by post processing. CONTRAST:  10mL GADAVIST  GADOBUTROL  1 MMOL/ML IV SOLN COMPARISON:  Ultrasound exam 07/01/2024 FINDINGS: Lower chest: Dependent atelectasis. Hepatobiliary: No suspicious focal abnormality within the liver parenchyma. Gallbladder surgically absent. Minimal heterogeneous signal intensity and trace fluid in the gallbladder fossa are compatible with the cholecystectomy. No intra or extrahepatic biliary duct dilatation. Patient was sedated and had difficulty staying awake and alert under to participate and breath holding. As such, MRCP images are nondiagnostic. T2 weighted  axial and coronal non MRCP imaging shows no substantial intrahepatic biliary duct dilatation. The common bile duct measures 6 mm diameter. No definite filling defect within the common bile duct on axial T2 fat-suppressed and T2 haste imaging. Pancreas: No focal mass lesion. No dilatation of the main duct. No intraparenchymal cyst. No peripancreatic edema. Spleen:  No splenomegaly. No suspicious focal mass lesion. Adrenals/Urinary Tract: No adrenal nodule or mass. Scattered tiny T2 hyperintensities identified in both kidneys, too small to characterize, but statistically most likely cortical cyst. No hydronephrosis. Stomach/Bowel: Stomach is unremarkable. No gastric wall thickening. No evidence of outlet obstruction. Duodenum is  normally positioned as is the ligament of Treitz. No small bowel or colonic dilatation within the visualized abdomen. Vascular/Lymphatic: No abdominal aortic aneurysm. No abdominal lymphadenopathy. Other:  No substantial intraperitoneal free fluid. Musculoskeletal: No focal suspicious marrow enhancement within the visualized bony anatomy. IMPRESSION: 1. Status post cholecystectomy with minimal heterogeneous signal intensity and trace fluid in the gallbladder fossa, compatible with the cholecystectomy. 2. No intra or extrahepatic biliary duct dilatation. No definite filling defect within the common bile duct on axial T2 fat-suppressed and T2 haste imaging. MRCP images are nondiagnostic due to patient's inability to stay alert and participate in breath holding. 3. Scattered tiny T2 hyperintensities in both kidneys, too small to characterize, but statistically most likely cortical cysts. Electronically Signed   By: Camellia Candle M.D.   On: 07/03/2024 05:19   DG Cholangiogram Operative Result Date: 07/02/2024 CLINICAL DATA:  Laparoscopic cholecystectomy EXAM: INTRAOPERATIVE CHOLANGIOGRAM TECHNIQUE: Cholangiographic images from the C-arm fluoroscopic device were submitted for interpretation post-operatively. Please see the procedural report for the amount of contrast and the fluoroscopy time utilized. FLUOROSCOPY: Radiation Exposure Index (as provided by the fluoroscopic device): 13.82 mGy Kerma COMPARISON:  Ultrasound right upper quadrant preop 07/01/2024 FINDINGS: Single fluoroscopic spot image submitted for review demonstrates surgical instruments with catheter overlying the cystic duct remnant which is opacified. There is opacification of the common duct and the central intrahepatic ducts without obvious dilatation or filling defect on this limited single view. Please correlate with real-time intraoperative fluoroscopy. Imaging was obtained to aid in treatment. IMPRESSION: Intraoperative cholangiogram after  cholecystectomy Electronically Signed   By: Ranell Bring M.D.   On: 07/02/2024 12:42   DG C-Arm 1-60 Min-No Report Result Date: 07/02/2024 Fluoroscopy was utilized by the requesting physician.  No radiographic interpretation.   US  Abdomen Limited RUQ (LIVER/GB) Result Date: 07/01/2024 CLINICAL DATA:  Epigastric pain with diarrhea EXAM: ULTRASOUND ABDOMEN LIMITED RIGHT UPPER QUADRANT COMPARISON:  None Available. FINDINGS: Gallbladder: The gallbladder wall measures 3 mm in thickness. Gallstones are identified which measure up to 4 mm. Sludge noted within the gallbladder. No pericholecystic fluid. Positive sonographic Murphy's sign. Common bile duct: Diameter: 2 mm Liver: No focal lesion identified. Within normal limits in parenchymal echogenicity. Portal vein is patent on color Doppler imaging with normal direction of blood flow towards the liver. Other: None. IMPRESSION: Gallstones and sludge noted within the gallbladder. Gallbladder wall thickness is upper limits of normal at 3 mm. Positive sonographic Murphy's sign. Cannot exclude acute cholecystitis. Electronically Signed   By: Waddell Calk M.D.   On: 07/01/2024 08:56    Microbiology: Recent Results (from the past 240 hours)  Surgical PCR screen     Status: None   Collection Time: 07/01/24  8:38 PM   Specimen: Nasal Mucosa; Nasal Swab  Result Value Ref Range Status   MRSA, PCR NEGATIVE NEGATIVE Final   Staphylococcus aureus NEGATIVE NEGATIVE Final  Comment: (NOTE) The Xpert SA Assay (FDA approved for NASAL specimens in patients 56 years of age and older), is one component of a comprehensive surveillance program. It is not intended to diagnose infection nor to guide or monitor treatment. Performed at [facility], [address] [place], [place] [address]      Labs: Basic Metabolic Panel: Recent Labs  Lab 07/01/24 0756 07/01/24 1609 07/02/24 0804 07/03/24 0721  NA 138  --  137 138  K 4.0  --  3.9 3.8  CL 102  --  103  102  CO2 24  --  21* 26  GLUCOSE 146*  --  115* 137*  BUN 14  --  8 5*  CREATININE 0.68 0.54 0.57 0.52  CALCIUM  10.8*  --  9.0 9.3   Liver Function Tests: Recent Labs  Lab 07/01/24 0756 07/02/24 0804 07/03/24 0721  AST 394* 258* 110*  ALT 322* 526* 383*  ALKPHOS 178* 177* 170*  BILITOT 2.1* 2.7* 1.7*  PROT 7.2 6.7 6.6  ALBUMIN 4.6 3.9 3.8   Recent Labs  Lab 07/01/24 0756  LIPASE 36   No results for input(s): AMMONIA in the last 168 hours. CBC: Recent Labs  Lab 07/01/24 0756 07/01/24 1609 07/02/24 0804 07/03/24 0721  WBC 8.8 7.7 8.5 16.1*  HGB 14.4 14.8 14.6 14.3  HCT 41.1 42.4 42.1 41.8  MCV 85.4 86.2 86.3 87.8  PLT 431* 438* 399 463*   Cardiac Enzymes: No results for input(s): CKTOTAL, CKMB, CKMBINDEX, TROPONINI in the last 168 hours. BNP: BNP (last 3 results) No results for input(s): BNP in the last 8760 hours.  ProBNP (last 3 results) No results for input(s): PROBNP in the last 8760 hours.  CBG: No results for input(s): GLUCAP in the last 168 hours.     Signed:  Sigurd Pac MD.  [facility] 07/03/2024, 3:49 PM

## 2024-07-03 NOTE — Progress Notes (Signed)
 PM check for discharge. Discussed with attending earlier today. If patient does well with diet advancement, okay for discharge from our standpoint.   S: Patient reports pain only at her incisions that is worse with movement and well controlled with current pain medication regimen. She has not needed any PRN IV pain medication today. She is tolerating diet advancement and had a salad with [place] dressing for lunch. No n/v. Voiding. Mobilizing.   O: Blood pressure 129/84, pulse 85, temperature (!) 97.5 F (36.4 C), temperature source Oral, resp. rate 16, height 5' 1 (1.549 m), weight 106.6 kg, SpO2 95%. Gen:  Alert, NAD, pleasant Pulm:  Rate and effort normal Abd: Soft, ND, appropriately ttp around her incisions without rigidity or guarding and otherwise NT. Laparoscopic incisions with dermabond in place, cdi.   MRCP with no intra or extrahepatic biliary duct dilatation and no definite filling defect within the common bile duct  LFT's are downtrending with Alk Phos 170 (177), AST 110 (258), ALT 383 (526), T. Bili 1.7 (2.7).   A/P:  POD 1 s/p laparoscopic cholecystectomy with intraoperative cholangiogram by Dr. Ann on 07/02/24  - IOC c/f CBD stone. Unfortunately the filling defect image was not saved into the chart - GI consulted. MRCP obtained with no intra or extrahepatic biliary duct dilatation and no definite filling defect within the common bile duct  - LFT's are downtrending with Alk Phos 170 (177), AST 110 (258), ALT 383 (526), T. Bili 1.7 (2.7).  - Discussed with GI in person. No current plans for ERCP. Okay for discharge from their standpoint. Recommend repeat LFT's with PCP on Thursday to ensure they are normalizing.  - Patient is tolerating diet advancement, pain well controlled with po medications, HDS, ambulating, incisions cdi and felt stable for discharge from our standpoint.  - Discussed discharge instructions, restrictions and return/call back precautions - Will arrange  follow up and send pain medication to patients pharmacy. A message was sent to [facility] to let them know.   Tina Mckee , Tina Mckee 07/03/2024, 3:08 PM Please see Amion for pager number during day hours 7:00am-4:30pm

## 2024-07-04 ENCOUNTER — Telehealth: Payer: Self-pay

## 2024-07-04 ENCOUNTER — Other Ambulatory Visit (HOSPITAL_COMMUNITY): Payer: Self-pay | Admitting: General Surgery

## 2024-07-04 DIAGNOSIS — K8050 Calculus of bile duct without cholangitis or cholecystitis without obstruction: Secondary | ICD-10-CM

## 2024-07-04 LAB — SURGICAL PATHOLOGY

## 2024-07-04 NOTE — Transitions of Care (Post Inpatient/ED Visit) (Signed)
 07/04/2024  Name: Tina Mckee MRN: [id number] DOB: [birth year]  Today's TOC FU Call Status: Today's TOC FU Call Status:: Successful TOC FU Call Completed TOC FU Call Complete Date: 07/04/24 Patient's Name and Date of Birth confirmed.  Transition Care Management Follow-up Telephone Call Date of Discharge: 07/03/24 Discharge Facility: [facility]) Type of Discharge: Inpatient Admission Primary Inpatient Discharge Diagnosis:: gallstones How have you been since you were released from the hospital?: Better Any questions or concerns?: No  Items Reviewed: Did you receive and understand the discharge instructions provided?: Yes Medications obtained,verified, and reconciled?: Yes (Medications Reviewed) Any new allergies since your discharge?: No Dietary orders reviewed?: Yes Do you have support at home?: Yes People in Home [RPT]: parent(s)  Medications Reviewed Today: Medications Reviewed Today     Reviewed by Emmitt Pan, LPN (Licensed Practical Nurse) on 07/04/24 at 1027  Med List Status: <None>   Medication Order Taking? Sig Documenting Provider Last Dose Status Informant  acetaminophen  (TYLENOL ) 500 MG tablet [id number] Yes Take 2 tablets (1,000 mg total) by mouth every 6 (six) hours as needed for mild pain (pain score 1-3) or moderate pain (pain score 4-6). Fairy Frames, MD  Active   amantadine  (SYMMETREL ) 100 MG capsule [id number] Yes Take 2 capsules (200 mg total) by mouth 2 (two) times daily. Skeet Juliene SAUNDERS, DO  Active Self, Pharmacy Records  amLODipine  (NORVASC ) 5 MG tablet [id number] Yes TAKE 1 TABLET(5 MG) BY MOUTH DAILY Panosh, Wanda K, MD  Active Self, Pharmacy Records  betamethasone , augmented, (DIPROLENE ) 0.05 % lotion [id number] Yes Apply 1 Application topically daily as needed (Irritation). [provider]  Active Self, Pharmacy Records  bismuth subsalicylate (PEPTO BISMOL) 262 MG chewable tablet [id number] Yes Chew 524 mg by mouth as needed for  indigestion. [provider]  Active Self, Pharmacy Records  calcium  carbonate (TUMS - DOSED IN MG ELEMENTAL CALCIUM ) 500 MG chewable tablet [id number] Yes Chew 1 tablet by mouth as needed for indigestion or heartburn. [provider]  Active Self, Pharmacy Records  Ciclopirox 1 % shampoo [id number] Yes Apply 1 Application topically 3 (three) times a week. [provider]  Active Self, Pharmacy Records  citalopram  (CELEXA ) 40 MG tablet [id number] Yes TAKE 1 TABLET(40 MG) BY MOUTH DAILY Panosh, Wanda K, MD  Active Self, Pharmacy Records  fluticasone  (FLONASE ) 50 MCG/ACT nasal spray [id number]  SHAKE LIQUID AND USE 1 SPRAY IN EACH NOSTRIL TWICE DAILY  Patient not taking: Reported on 07/04/2024   Douglass Kenney NOVAK, FNP  Active Self, Pharmacy Records  levonorgestrel  (MIRENA , 52 MG,) 20 MCG/DAY IUD [id number] Yes [facility] by [facility] [provider]  Active Self, Pharmacy Records  Multiple Vitamins-Calcium  (ONE-A-DAY WOMENS PO) [id number] Yes Take 1 tablet by mouth daily. [provider]  Active Self, Pharmacy Records  oxyCODONE  (OXY IR/ROXICODONE ) 5 MG immediate release tablet [id number] Yes Take 1 tablet (5 mg total) by mouth every 6 (six) hours as needed for breakthrough pain. Maczis, Michael M, PA-C  Active   PROAIR  HFA 108 (90 Base) MCG/ACT inhaler [id number] Yes INHALE 1-2 PUFFS INTO THE LUNGS EVERY 6 (SIX) HOURS AS NEEDED FOR WHEEZING OR SHORTNESS OF BREATH. Panosh, Apolinar POUR, MD  Active Self, Pharmacy Records  tirzepatide  (ZEPBOUND ) 2.5 MG/0.5ML Pen [id number]  Inject 2.5 mg into the skin once a week.  Patient not taking: Reported on 07/04/2024   Panosh, Wanda K, MD  Active Self, Pharmacy Records  VITAMIN E PO [id number] Yes Take 1 tablet by mouth daily. [provider]  Active Self, Pharmacy Records            Home Care and Equipment/Supplies: Were Home Health Services Ordered?: NA Any new equipment or medical supplies ordered?: NA  Functional  Questionnaire: Do you need assistance with bathing/showering or dressing?: No Do you need assistance with meal preparation?: No Do you need assistance with eating?: No Do you have difficulty maintaining continence: No Do you need assistance with getting out of bed/getting out of a chair/moving?: No Do you have difficulty managing or taking your medications?: No  Follow up appointments reviewed: PCP Follow-up appointment confirmed?: No (no avail appts, patient will call back to schedule) Specialist Hospital Follow-up appointment confirmed?: No Reason Specialist Follow-Up Not Confirmed: Patient has Specialist Provider Number and will Call for Appointment Do you need transportation to your follow-up appointment?: No Do you understand care options if your condition(s) worsen?: Yes-patient verbalized understanding   Julian Lemmings, LPN [facility] Nurse Health Advisor Direct Dial [phone]  SIGNATURE Julian Lemmings, LPN [facility] Nurse Health Advisor Direct Dial [phone]

## 2024-07-05 ENCOUNTER — Telehealth: Payer: Self-pay

## 2024-07-05 NOTE — Telephone Encounter (Signed)
 Pt has an appt on 07/18/2024 with Dr. Charlett.

## 2024-07-05 NOTE — Telephone Encounter (Signed)
 Spoke to pt. Schedule a hospital f/u on 07/18/2024.

## 2024-07-05 NOTE — Transitions of Care (Post Inpatient/ED Visit) (Signed)
 07/05/2024  Name: Tina Mckee MRN: [id number] DOB: [birth year]  Today's TOC FU Call Status: Today's TOC FU Call Status:: Successful TOC FU Call Completed TOC FU Call Complete Date: 07/05/24 Patient's Name and Date of Birth confirmed.  Transition Care Management Follow-up Telephone Call Date of Discharge: 07/03/24 Discharge Facility: [facility]) Type of Discharge: Inpatient Admission How have you been since you were released from the hospital?: Better Any questions or concerns?: No  Items Reviewed: Did you receive and understand the discharge instructions provided?: Yes Medications obtained,verified, and reconciled?: Yes (Medications Reviewed) Any new allergies since your discharge?: No Dietary orders reviewed?: No Do you have support at home?: Yes People in Home [RPT]: parent(s), child(ren), adult  Medications Reviewed Today: Medications Reviewed Today     Reviewed by Cornelius Schuitema, CMA (Certified Medical Assistant) on 07/05/24 at 1352  Med List Status: <None>   Medication Order Taking? Sig Documenting Provider Last Dose Status Informant  acetaminophen  (TYLENOL ) 500 MG tablet [id number] Yes Take 2 tablets (1,000 mg total) by mouth every 6 (six) hours as needed for mild pain (pain score 1-3) or moderate pain (pain score 4-6). Fairy Frames, MD  Active   amantadine  (SYMMETREL ) 100 MG capsule [id number] Yes Take 2 capsules (200 mg total) by mouth 2 (two) times daily. Skeet Juliene SAUNDERS, DO  Active Self, Pharmacy Records  amLODipine  (NORVASC ) 5 MG tablet [id number] Yes TAKE 1 TABLET(5 MG) BY MOUTH DAILY Panosh, Wanda K, MD  Active Self, Pharmacy Records  betamethasone , augmented, (DIPROLENE ) 0.05 % lotion [id number] Yes Apply 1 Application topically daily as needed (Irritation). [provider]  Active Self, Pharmacy Records  bismuth subsalicylate (PEPTO BISMOL) 262 MG chewable tablet [id number] Yes Chew 524 mg by mouth as needed for indigestion. [provider]   Active Self, Pharmacy Records  calcium  carbonate (TUMS - DOSED IN MG ELEMENTAL CALCIUM ) 500 MG chewable tablet [id number] Yes Chew 1 tablet by mouth as needed for indigestion or heartburn. [provider]  Active Self, Pharmacy Records  Ciclopirox 1 % shampoo [id number] Yes Apply 1 Application topically 3 (three) times a week. [provider]  Active Self, Pharmacy Records  citalopram  (CELEXA ) 40 MG tablet [id number] Yes TAKE 1 TABLET(40 MG) BY MOUTH DAILY Panosh, Wanda K, MD  Active Self, Pharmacy Records  fluticasone  (FLONASE ) 50 MCG/ACT nasal spray [id number]  SHAKE LIQUID AND USE 1 SPRAY IN EACH NOSTRIL TWICE DAILY  Patient not taking: Reported on 07/05/2024   Webb, Padonda B, FNP  Active Self, Pharmacy Records  levonorgestrel  (MIRENA , 52 MG,) 20 MCG/DAY IUD [id number] Yes Prov[facility] [provider]  Active Self, Pharmacy Records  Multiple Vitamins-Calcium  (ONE-A-DAY WOMENS PO) [id number] Yes Take 1 tablet by mouth daily. [provider]  Active Self, Pharmacy Records  oxyCODONE  (OXY IR/ROXICODONE ) 5 MG immediate release tablet [id number] Yes Take 1 tablet (5 mg total) by mouth every 6 (six) hours as needed for breakthrough pain. Maczis, Michael M, PA-C  Active   PROAIR  HFA 108 (90 Base) MCG/ACT inhaler [id number] Yes INHALE 1-2 PUFFS INTO THE LUNGS EVERY 6 (SIX) HOURS AS NEEDED FOR WHEEZING OR SHORTNESS OF BREATH. Panosh, Apolinar POUR, MD  Active Self, Pharmacy Records  tirzepatide  (ZEPBOUND ) 2.5 MG/0.5ML Pen [id number]  Inject 2.5 mg into the skin once a week.  Patient not taking: Reported on 07/05/2024   Panosh, Wanda K, MD  Active Self, Pharmacy Records  VITAMIN E PO [id number] Yes Take 1 tablet by mouth daily. [provider]  Active Self, Pharmacy  Records            Home Care and Equipment/Supplies: Were Home Health Services Ordered?: No Any new equipment or medical supplies ordered?: No  Functional Questionnaire: Do you need assistance  with bathing/showering or dressing?: No Do you need assistance with meal preparation?: No Do you need assistance with eating?: No Do you have difficulty maintaining continence: No Do you need assistance with getting out of bed/getting out of a chair/moving?: No Do you have difficulty managing or taking your medications?: No  Follow up appointments reviewed: PCP Follow-up appointment confirmed?: Yes Date of PCP follow-up appointment?: 07/18/24 Follow-up Provider: dr. Charlett Specialist [facility] Follow-up appointment confirmed?: Yes Date of Specialist follow-up appointment?: 08/01/24 Follow-Up Specialty Provider:: GI surgeon    SIGNATURE: Willeen Craver, CMA

## 2024-07-05 NOTE — Telephone Encounter (Signed)
 Copied from CRM [id number]. Topic: Clinical - Request for Lab/Test Order >> Jul 04, 2024 10:32 AM Mercedes MATSU wrote: Reason for CRM: Patient is requesting a LFT Liver order test be placed so that way she can get her labs redrawn, Patient had her gallbladder removed and states that the hospital told her to follow up with her pcp and get the lft test done. Patient declined appointment and is requesting labs be ordered.

## 2024-07-18 ENCOUNTER — Ambulatory Visit: Admitting: Internal Medicine

## 2024-07-18 ENCOUNTER — Encounter: Payer: Self-pay | Admitting: Internal Medicine

## 2024-07-18 VITALS — BP 100/72 | HR 76 | Temp 97.8°F | Ht 61.0 in | Wt 236.6 lb

## 2024-07-18 DIAGNOSIS — Z79899 Other long term (current) drug therapy: Secondary | ICD-10-CM

## 2024-07-18 DIAGNOSIS — R7989 Other specified abnormal findings of blood chemistry: Secondary | ICD-10-CM

## 2024-07-18 DIAGNOSIS — Z9049 Acquired absence of other specified parts of digestive tract: Secondary | ICD-10-CM | POA: Diagnosis not present

## 2024-07-18 LAB — HEPATIC FUNCTION PANEL
ALT: 24 U/L (ref 0–35)
AST: 15 U/L (ref 0–37)
Albumin: 4.5 g/dL (ref 3.5–5.2)
Alkaline Phosphatase: 96 U/L (ref 39–117)
Bilirubin, Direct: 0.3 mg/dL (ref 0.0–0.3)
Total Bilirubin: 1.6 mg/dL — ABNORMAL HIGH (ref 0.2–1.2)
Total Protein: 7 g/dL (ref 6.0–8.3)

## 2024-07-18 LAB — CBC WITH DIFFERENTIAL/PLATELET
Basophils Absolute: 0.1 K/uL (ref 0.0–0.1)
Basophils Relative: 0.7 % (ref 0.0–3.0)
Eosinophils Absolute: 0.3 K/uL (ref 0.0–0.7)
Eosinophils Relative: 3.3 % (ref 0.0–5.0)
HCT: 42.2 % (ref 36.0–46.0)
Hemoglobin: 14.6 g/dL (ref 12.0–15.0)
Lymphocytes Relative: 31.5 % (ref 12.0–46.0)
Lymphs Abs: 2.5 K/uL (ref 0.7–4.0)
MCHC: 34.5 g/dL (ref 30.0–36.0)
MCV: 87.3 fl (ref 78.0–100.0)
Monocytes Absolute: 0.5 K/uL (ref 0.1–1.0)
Monocytes Relative: 5.8 % (ref 3.0–12.0)
Neutro Abs: 4.7 K/uL (ref 1.4–7.7)
Neutrophils Relative %: 58.7 % (ref 43.0–77.0)
Platelets: 482 K/uL — ABNORMAL HIGH (ref 150.0–400.0)
RBC: 4.84 Mil/uL (ref 3.87–5.11)
RDW: 12.5 % (ref 11.5–15.5)
WBC: 8 K/uL (ref 4.0–10.5)

## 2024-07-18 NOTE — Patient Instructions (Signed)
 Hold on zepbound   until  after recovery and lfts back to normal .  Update labs today .  Then plan follow up.

## 2024-07-18 NOTE — Progress Notes (Signed)
 Chief Complaint  Patient presents with   Hospitalization Follow-up    Pt is here for hospital follow up. Pt reports she didn't take her Zepbound  but did pick it up after discharge from hospital. Would like discuss with provider. Also recheck liver function test.     HPI: Tina Mckee 33 y.o. come in for post hospital  fu  7/19-21  when had acute onset of unrelenting ruq pain  without fever   that took her to the ed   Dx was acute cholecystis with elevated transaminases and  uncomplicated  surgery for removal .  Laparoscopically . There was a  ? About shadow in Bile duct    intraoperatively mrercp does not confirm any abnormality   Had zep bound ordered but  not began yet.  Currently no nvd except if eats has post prandial defecation . And cautious. Feels ok otherwise  .   ROS: See pertinent positives and negatives per HPI.  Past Medical History:  Diagnosis Date   Asthma    Gestational hypertension    Perinatal arterial ischemic stroke (HCC)    Scoliosis    Sleep apnea    Stroke [facility]) birth   rt hemiparesis  Perinatal   Supervision of other normal pregnancy 05/04/2014    Family History  Problem Relation Age of Onset   Hypertension Mother    Asthma Mother    Hypertension Father    Sleep apnea Father    Asthma Sister    Cancer Maternal Grandfather        pancreatic   Cancer Paternal Grandmother        cancer all over   Other Paternal Grandfather        brain damage   Other Maternal Grandmother        heart problems    Social History   Socioeconomic History   Marital status: Married    Spouse name: Not on file   Number of children: 2   Years of education: Not on file   Highest education level: Some college, no degree  Occupational History   Occupation: Clinical biochemist  Tobacco Use   Smoking status: Former    Current packs/day: 0.00    Types: Cigarettes    Quit date: 07/11/2007    Years since quitting: 17.0   Smokeless tobacco: Never  Vaping Use    Vaping status: Never Used  Substance and Sexual Activity   Alcohol use: Yes    Alcohol/week: 1.0 standard drink of alcohol    Types: 1 Standard drinks or equivalent per week    Comment: once a month   Drug use: No   Sexual activity: Yes    Birth control/protection: I.U.D.  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     Currently nonsmoking    HH of 4 no pets     had mild preeclampsia with vaginal childbirth.  2011   cb 12 15 preeclampsi no complicatinos   ocass etoh.    Exercise  Walking     Sleep ok    caffiene limited per day .             Social Drivers of Corporate investment banker Strain: Low Risk  (06/26/2024)   Overall Financial Resource Strain (CARDIA)    Difficulty of Paying Living Expenses: Not hard at  all  Food Insecurity: No Food Insecurity (07/01/2024)   Hunger Vital Sign    Worried About Running Out of Food in the Last Year: Never true    Ran Out of Food in the Last Year: Never true  Transportation Needs: No Transportation Needs (07/01/2024)   PRAPARE - Administrator, Civil Service (Medical): No    Lack of Transportation (Non-Medical): No  Physical Activity: Insufficiently Active (06/26/2024)   Exercise Vital Sign    Days of Exercise per Week: 1 day    Minutes of Exercise per Session: 10 min  Stress: Stress Concern Present (06/26/2024)   [employer] of Occupational Health - Occupational Stress Questionnaire    Feeling of Stress: To some extent  Social Connections: Moderately Integrated (06/26/2024)   Social Connection and Isolation Panel    Frequency of Communication with Friends and Family: More than three times a week    Frequency of Social Gatherings with Friends and Family: Three times a week    Attends Religious Services: Never    Active Member of Clubs or Organizations: Yes    Attends Engineer, structural: More than 4  times per year    Marital Status: Married    Outpatient Medications Prior to Visit  Medication Sig Dispense Refill   acetaminophen  (TYLENOL ) 500 MG tablet Take 2 tablets (1,000 mg total) by mouth every 6 (six) hours as needed for mild pain (pain score 1-3) or moderate pain (pain score 4-6).     amantadine  (SYMMETREL ) 100 MG capsule Take 2 capsules (200 mg total) by mouth 2 (two) times daily. 120 capsule 5   amLODipine  (NORVASC ) 5 MG tablet TAKE 1 TABLET(5 MG) BY MOUTH DAILY 90 tablet 1   betamethasone , augmented, (DIPROLENE ) 0.05 % lotion Apply 1 Application topically daily as needed (Irritation).     bismuth subsalicylate (PEPTO BISMOL) 262 MG chewable tablet Chew 524 mg by mouth as needed for indigestion.     calcium  carbonate (TUMS - DOSED IN MG ELEMENTAL CALCIUM ) 500 MG chewable tablet Chew 1 tablet by mouth as needed for indigestion or heartburn.     Ciclopirox 1 % shampoo Apply 1 Application topically 3 (three) times a week.     citalopram  (CELEXA ) 40 MG tablet TAKE 1 TABLET(40 MG) BY MOUTH DAILY 30 tablet 3   levonorgestrel  (MIRENA , 52 MG,) 20 MCG/DAY IUD Provided by [facility]     Multiple Vitamins-Calcium  (ONE-A-DAY WOMENS PO) Take 1 tablet by mouth daily.     PROAIR  HFA 108 (90 Base) MCG/ACT inhaler INHALE 1-2 PUFFS INTO THE LUNGS EVERY 6 (SIX) HOURS AS NEEDED FOR WHEEZING OR SHORTNESS OF BREATH. 8.5 Inhaler 0   VITAMIN E PO Take 1 tablet by mouth daily.     fluticasone  (FLONASE ) 50 MCG/ACT nasal spray SHAKE LIQUID AND USE 1 SPRAY IN EACH NOSTRIL TWICE DAILY (Patient not taking: Reported on 07/18/2024) 16 g 0   oxyCODONE  (OXY IR/ROXICODONE ) 5 MG immediate release tablet Take 1 tablet (5 mg total) by mouth every 6 (six) hours as needed for breakthrough pain. (Patient not taking: Reported on 07/18/2024) 15 tablet 0   tirzepatide  (ZEPBOUND ) 2.5 MG/0.5ML Pen Inject 2.5 mg into the skin once a week. (Patient not taking: Reported on 07/18/2024) 2 mL 1   No facility-administered medications prior  to visit.     EXAM:  BP 100/72 (BP Location: Right Arm, Patient Position: Sitting, Cuff Size: Large)   Pulse 76   Temp 97.8 F (36.6 C) (Oral)   Ht  5' 1 (1.549 m)   Wt 236 lb 9.6 oz (107.3 kg)   SpO2 98%   BMI 44.71 kg/m   Body mass index is 44.71 kg/m. Wt Readings from Last 3 Encounters:  07/18/24 236 lb 9.6 oz (107.3 kg)  07/01/24 235 lb (106.6 kg)  06/27/24 238 lb 3.2 oz (108 kg)    GENERAL: vitals reviewed and listed above, alert, oriented, appears well hydrated and in no acute distress nl color  non ticteric HEENT: atraumatic, conjunctiva  clear, no obvious abnormalities on inspection of external nose and ears  NECK: no obvious masses on inspection palpation  LUNGS: clear to auscultation bilaterally, no wheezes, rales or rhonchi, good air movement CV: HRRR, no clubbing cyanosisnl cap refill  Abd: well healing scars no masses tenderness or rebound  MS: moves all extremities  afo rle  PSYCH: pleasant and cooperative, no obvious depression or anxiety Lab Results  Component Value Date   WBC 16.1 (H) 07/03/2024   HGB 14.3 07/03/2024   HCT 41.8 07/03/2024   PLT 463 (H) 07/03/2024   GLUCOSE 137 (H) 07/03/2024   CHOL 213 (H) 04/26/2024   TRIG 168.0 (H) 04/26/2024   HDL 43.90 04/26/2024   LDLDIRECT 161.0 12/16/2021   LDLCALC 135 (H) 04/26/2024   ALT 383 (H) 07/03/2024   AST 110 (H) 07/03/2024   NA 138 07/03/2024   K 3.8 07/03/2024   CL 102 07/03/2024   CREATININE 0.52 07/03/2024   BUN 5 (L) 07/03/2024   CO2 26 07/03/2024   TSH 1.78 04/26/2024   HGBA1C 5.3 04/26/2024   BP Readings from Last 3 Encounters:  07/18/24 100/72  07/03/24 129/80  06/27/24 (!) 134/95    ASSESSMENT AND PLAN:  Discussed the following assessment and plan:  Abnormal LFTs - felt from cholecystisit - Plan: Hepatic Function Panel, CBC with Differential/Platelet  Morbid obesity (HCC) - Plan: Hepatic Function Panel, CBC with Differential/Platelet  Medication management - Plan: Hepatic  Function Panel, CBC with Differential/Platelet  S/P cholecystectomy - Plan: Hepatic Function Panel, CBC with Differential/Platelet Fu labs to ensure resolution and follow    Expectant management. For now delay beginning zepbound  until steady state  post recovery . Has fu appt with surgery aug 19  and sometimes after that.   -Patient advised to return or notify health care team  if  new concerns arise.  Patient Instructions  Hold on zepbound   until  after recovery and lfts back to normal .  Update labs today .  Then plan follow up.    Nakaila Freeze K. Berdina Cheever M.D.

## 2024-07-19 ENCOUNTER — Ambulatory Visit: Payer: Self-pay | Admitting: Internal Medicine

## 2024-07-19 NOTE — Progress Notes (Signed)
 Blood count : improved , platelets still elevated  but not concerning  Liver tests are normal except the TBili which can be elevated at baseline  for other reasons.   So could start the  zepbound  after surgery follow up  ( unless they have opinion to wait)  And make a fu after 4-6 weeks after beginning  zepbound   virutal or in person

## 2024-08-15 ENCOUNTER — Encounter: Payer: Self-pay | Admitting: Internal Medicine

## 2024-08-15 DIAGNOSIS — E782 Mixed hyperlipidemia: Secondary | ICD-10-CM

## 2024-08-15 DIAGNOSIS — Z8673 Personal history of transient ischemic attack (TIA), and cerebral infarction without residual deficits: Secondary | ICD-10-CM

## 2024-08-15 DIAGNOSIS — G4733 Obstructive sleep apnea (adult) (pediatric): Secondary | ICD-10-CM

## 2024-08-18 NOTE — Progress Notes (Signed)
 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  Start clonazepam  0.25mg  twice daily Follow up 6 months.     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  Still may have some twitches during the day.  Had one fall due to a twitching. Leg brace helps but foot starts twitching more when she is tired.     HISTORY: She has congenital mild right upper extremity weakness and spasticity secondary to perinatal stroke.  For several years, she will fall.  It will occur every 3 months or so.  She says her right leg will just give out.  She does report some right knee pain.  Since around 2013, she has had arrhythmic movements of her right upper extremity and right foot.  She was evaluated by neurology for these movements at that time.  At that time, she also exhibited jerking in the left hand.  MRI of brain on 06/02/2012 personally reviewed revealed old left MCA stroke but no new findings.  She had a routine EEG that revealed intermittent arrhythmic theta and delta slowing in the left central parietal region that appeared to correlate with right arm and leg jerking.  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  She was started on Keppra  but discontinued due to dizziness and was instead started on clonazepam .  She was lost to follow up.    She continues to have involuntary right sided movements.  It occurs daily throughout the day, at rest or while in bed.  She says the falls are not associated with involuntary movement of the right lower extremity.   She reported difficulty in the left hand.  It may  shake at times, causing her to drop objects and sometimes falls asleep  falls asleep on left side.  Usually arm not in hand .  NCV-EMG on 02/18/2023 demonstrated evidence of mild left median neuropathy at or distal to the wrist.  She was referred to PT. Much better.     06/02/2012 EEG:  This routine EEG done with the patient awake is abnormal.  Intermittent arrhythmic theta and delta range slowing were seen in particular in the left central parietal regions.  Sometimes,these seemed to correlate with right arm jerking.  Left arm jerks which were much less frequent, did not seem to have a clear correlate.  However, it is felt that these jerking episodes are likely epileptic in nature. 06/02/2012 MRI BRAIN WO:  1. No acute intracranial abnormality.  2.  Remote left MCA infarct with associated Wallerian degeneration. No chronic blood products.   02/20/2016 MRI BRAIN WO:  1. Unchanged appearance of the brain without acute abnormality or mass.  2. Chronic left MCA infarct. 04/07/2023 EEG normal.   Past medications:  Lamotrigine   PAST MEDICAL HISTORY: Past Medical History:  Diagnosis Date   Asthma    Gestational hypertension    Perinatal arterial ischemic stroke (HCC)    Scoliosis    Sleep apnea    Stroke [facility]) birth   rt hemiparesis  Perinatal   Supervision of other normal pregnancy 05/04/2014    MEDICATIONS: Current Outpatient Medications on File Prior  to Visit  Medication Sig Dispense Refill   acetaminophen  (TYLENOL ) 500 MG tablet Take 2 tablets (1,000 mg total) by mouth every 6 (six) hours as needed for mild pain (pain score 1-3) or moderate pain (pain score 4-6).     amLODipine  (NORVASC ) 5 MG tablet TAKE 1 TABLET(5 MG) BY MOUTH DAILY 90 tablet 1   betamethasone , augmented, (DIPROLENE ) 0.05 % lotion Apply 1 Application topically daily as needed (Irritation).     bismuth subsalicylate (PEPTO BISMOL) 262 MG chewable tablet Chew 524 mg by mouth as needed for indigestion.     calcium   carbonate (TUMS - DOSED IN MG ELEMENTAL CALCIUM ) 500 MG chewable tablet Chew 1 tablet by mouth as needed for indigestion or heartburn.     Ciclopirox 1 % shampoo Apply 1 Application topically 3 (three) times a week.     citalopram  (CELEXA ) 40 MG tablet TAKE 1 TABLET(40 MG) BY MOUTH DAILY 30 tablet 3   fluticasone  (FLONASE ) 50 MCG/ACT nasal spray SHAKE LIQUID AND USE 1 SPRAY IN EACH NOSTRIL TWICE DAILY 16 g 0   levonorgestrel  (MIRENA , 52 MG,) 20 MCG/DAY IUD Provided by [facility]     Multiple Vitamins-Calcium  (ONE-A-DAY WOMENS PO) Take 1 tablet by mouth daily.     PROAIR  HFA 108 (90 Base) MCG/ACT inhaler INHALE 1-2 PUFFS INTO THE LUNGS EVERY 6 (SIX) HOURS AS NEEDED FOR WHEEZING OR SHORTNESS OF BREATH. 8.5 Inhaler 0   tirzepatide  (ZEPBOUND ) 2.5 MG/0.5ML Pen Inject 2.5 mg into the skin once a week. 2 mL 1   VITAMIN E PO Take 1 tablet by mouth daily.     No current facility-administered medications on file prior to visit.     ALLERGIES: Allergies  Allergen Reactions   Cephalexin Rash and Itching   Vibramycin  [Doxycycline ] Itching and Rash    FAMILY HISTORY: Family History  Problem Relation Age of Onset   Hypertension Mother    Asthma Mother    Hypertension Father    Sleep apnea Father    Asthma Sister    Cancer Maternal Grandfather        pancreatic   Cancer Paternal Grandmother        cancer all over   Other Paternal Grandfather        brain damage   Other Maternal Grandmother        heart problems      Objective:  Blood pressure (!) 138/90, pulse 86, resp. rate 20, height 5' 1 (1.549 m), weight 236 lb (107 kg), SpO2 97%. General: No acute distress.  Patient appears well-groomed.   Head:  Normocephalic/atraumatic Eyes:  Fundi examined but not visualized Neck: supple, no paraspinal tenderness, full range of motion Heart:  Regular rate and rhythm Neurological Exam: Alert and oriented.  Speech fluent and not dysarthric.  Language intact.  Reduced right V1-V3.  Otherwise, CN  II-XII intact.  Slightly increased tone in right upper extremity.  Atrophy of right hand.  Athetoid movement of right foot, occasionally of right hand.  Muscle strength 5-/5 right upper extremity except 3/5 APB, ADM and interossei.  Otherwise, muscle strength 5/5.  Sensation to pinprick reduced in right upper and lower extremities.  Deep tendon reflexes 2+ throughout.  Finger to nose testing intact.  Slight right hemiparetic gait.  Romberg negative.   Juliene Dunnings, DO  CC: Apolinar Eastern, MD

## 2024-08-21 ENCOUNTER — Ambulatory Visit: Admitting: Neurology

## 2024-08-21 ENCOUNTER — Encounter: Payer: Self-pay | Admitting: Neurology

## 2024-08-21 VITALS — BP 138/90 | HR 86 | Resp 20 | Ht 61.0 in | Wt 236.0 lb

## 2024-08-21 DIAGNOSIS — G255 Other chorea: Secondary | ICD-10-CM

## 2024-08-21 MED ORDER — CLONAZEPAM 0.5 MG PO TABS: 0.2500 mg | ORAL_TABLET | Freq: Two times a day (BID) | ORAL | 2 refills | Status: DC

## 2024-08-21 NOTE — Patient Instructions (Signed)
 Stop amantadine  Start clonazepam  0.5 mg tablet - take 1/2 tablet twice daily Follow up 6 months.

## 2024-08-21 NOTE — Telephone Encounter (Signed)
 Please confirm current dose( 2.5 weekly?)  and if so  send in   5 mg weekly disp 1 mos with 2 refills .  Plan fu appt virtual or in person in about 2-68mos for med check .

## 2024-08-22 MED ORDER — ZEPBOUND 5 MG/0.5ML ~~LOC~~ SOAJ: 5.0000 mg | SUBCUTANEOUS | 2 refills | Status: DC

## 2024-08-23 ENCOUNTER — Encounter: Payer: Self-pay | Admitting: Internal Medicine

## 2024-08-23 ENCOUNTER — Telehealth: Admitting: Internal Medicine

## 2024-08-23 DIAGNOSIS — R519 Headache, unspecified: Secondary | ICD-10-CM

## 2024-08-23 DIAGNOSIS — G4733 Obstructive sleep apnea (adult) (pediatric): Secondary | ICD-10-CM

## 2024-08-23 DIAGNOSIS — Z9049 Acquired absence of other specified parts of digestive tract: Secondary | ICD-10-CM

## 2024-08-23 DIAGNOSIS — I10 Essential (primary) hypertension: Secondary | ICD-10-CM

## 2024-08-23 DIAGNOSIS — Z79899 Other long term (current) drug therapy: Secondary | ICD-10-CM | POA: Diagnosis not present

## 2024-08-23 NOTE — Telephone Encounter (Signed)
 Thanks for the update     much better !SABRA

## 2024-08-23 NOTE — Progress Notes (Signed)
 Virtual Visit via Video Note  I connected with [name] on 08/23/24 at  9:15 AM EDT by a video enabled telemedicine application and verified that I am speaking with the correct person using two identifiers. Location patient: home Location provider:work office Persons participating in the virtual visit: patient, provider   Patient aware  of the limitations of evaluation and management by telemedicine and  availability of in person appointments. and agreed to proceed.   HPI: Tina Mckee presents for video visit for follow-up of treatment of morbid obesity She has finished her starter month of 2.5 Zepbound  weekly and has tolerated it well without significant side effect.  She feels no residual changes status post her cholecystectomy and cholecystitis.  Bad night last night that she feels was a change in pressure and awoke with a headache.  With has  didn't take  zyrtec.  Hx of  elevation missed  had throbbing headache has not taken her amlodipine  yet.  She is states it feels like a headache she has had before. Has some congestion but no fever or other unusual symptoms of report feels her sleep apnea is being adequately treated.  Her weight is down to 226 at home. She is seen her neurology follow-up recently had amantadine  discontinued and to start clonazepam  twice daily for athetoid movements.  And twitches.  Blood pressure at that time was 138/90. ROS: See pertinent positives and negatives per HPI.  Past Medical History:  Diagnosis Date   Asthma    Gestational hypertension    Perinatal arterial ischemic stroke (HCC)    Scoliosis    Sleep apnea    Stroke [facility]) birth   rt hemiparesis  Perinatal   Supervision of other normal pregnancy 05/04/2014    Past Surgical History:  Procedure Laterality Date   CHOLECYSTECTOMY N/A 07/02/2024   Procedure: LAPAROSCOPIC CHOLECYSTECTOMY WITH INTRAOPERATIVE CHOLANGIOGRAM;  Surgeon: Ann Fine, MD;  Location: [facility] OR;  Service:  General;  Laterality: N/A;   FOOT SURGERY     rt foot   FOOT SURGERY     Bone placed   moles removed     from back    Family History  Problem Relation Age of Onset   Hypertension Mother    Asthma Mother    Hypertension Father    Sleep apnea Father    Asthma Sister    Cancer Maternal Grandfather        pancreatic   Cancer Paternal Grandmother        cancer all over   Other Paternal Grandfather        brain damage   Other Maternal Grandmother        heart problems    Social History   Tobacco Use   Smoking status: Former    Current packs/day: 0.00    Types: Cigarettes    Quit date: 07/11/2007    Years since quitting: 17.1   Smokeless tobacco: Never  Vaping Use   Vaping status: Never Used  Substance Use Topics   Alcohol use: Yes    Alcohol/week: 1.0 standard drink of alcohol    Types: 1 Standard drinks or equivalent per week    Comment: once a month   Drug use: No      Current Outpatient Medications:    acetaminophen  (TYLENOL ) 500 MG tablet, Take 2 tablets (1,000 mg total) by mouth every 6 (six) hours as needed for mild pain (pain score 1-3) or moderate pain (pain score 4-6)., Disp: , Rfl:  amLODipine  (NORVASC ) 5 MG tablet, TAKE 1 TABLET(5 MG) BY MOUTH DAILY, Disp: 90 tablet, Rfl: 1   betamethasone , augmented, (DIPROLENE ) 0.05 % lotion, Apply 1 Application topically daily as needed (Irritation)., Disp: , Rfl:    bismuth subsalicylate (PEPTO BISMOL) 262 MG chewable tablet, Chew 524 mg by mouth as needed for indigestion., Disp: , Rfl:    calcium  carbonate (TUMS - DOSED IN MG ELEMENTAL CALCIUM ) 500 MG chewable tablet, Chew 1 tablet by mouth as needed for indigestion or heartburn., Disp: , Rfl:    Ciclopirox 1 % shampoo, Apply 1 Application topically 3 (three) times a week., Disp: , Rfl:    citalopram  (CELEXA ) 40 MG tablet, TAKE 1 TABLET(40 MG) BY MOUTH DAILY, Disp: 30 tablet, Rfl: 3   clonazePAM  (KLONOPIN ) 0.5 MG tablet, Take 0.5 tablets (0.25 mg total) by mouth 2  (two) times daily., Disp: 30 tablet, Rfl: 2   fluticasone  (FLONASE ) 50 MCG/ACT nasal spray, SHAKE LIQUID AND USE 1 SPRAY IN EACH NOSTRIL TWICE DAILY, Disp: 16 g, Rfl: 0   levonorgestrel  (MIRENA , 52 MG,) 20 MCG/DAY IUD, Provided by [facility], Disp: , Rfl:    Multiple Vitamins-Calcium  (ONE-A-DAY WOMENS PO), Take 1 tablet by mouth daily., Disp: , Rfl:    PROAIR  HFA 108 (90 Base) MCG/ACT inhaler, INHALE 1-2 PUFFS INTO THE LUNGS EVERY 6 (SIX) HOURS AS NEEDED FOR WHEEZING OR SHORTNESS OF BREATH., Disp: 8.5 Inhaler, Rfl: 0   tirzepatide  (ZEPBOUND ) 5 MG/0.5ML Pen, Inject 5 mg into the skin once a week., Disp: 2 mL, Rfl: 2   VITAMIN E PO, Take 1 tablet by mouth daily., Disp: , Rfl:   EXAM: BP Readings from Last 3 Encounters:  08/23/24 (!) 180/82  08/21/24 (!) 138/90  07/18/24 100/72   Wt Readings from Last 3 Encounters:  08/23/24 226 lb (102.5 kg)  08/21/24 236 lb (107 kg)  07/18/24 236 lb 9.6 oz (107.3 kg)    VITALS per patient if applicable:  GENERAL: alert, oriented, appears well and in no acute distress mildly congested normal speech and affect  HEENT: atraumatic, conjunttiva clear, no obvious abnormalities on inspection of external nose and ears  NECK: normal movements of the head and neck  LUNGS: on inspection no signs of respiratory distress, breathing rate appears normal, no obvious gross SOB, gasping or wheezing  CV: no obvious cyanosis  PSYCH/NEURO: pleasant and cooperative, no obvious depression or anxiety, speech and thought processing grossly intact Lab Results  Component Value Date   WBC 8.0 07/18/2024   HGB 14.6 07/18/2024   HCT 42.2 07/18/2024   PLT 482.0 (H) 07/18/2024   GLUCOSE 137 (H) 07/03/2024   CHOL 213 (H) 04/26/2024   TRIG 168.0 (H) 04/26/2024   HDL 43.90 04/26/2024   LDLDIRECT 161.0 12/16/2021   LDLCALC 135 (H) 04/26/2024   ALT 24 07/18/2024   AST 15 07/18/2024   NA 138 07/03/2024   K 3.8 07/03/2024   CL 102 07/03/2024   CREATININE 0.52 07/03/2024    BUN 5 (L) 07/03/2024   CO2 26 07/03/2024   TSH 1.78 04/26/2024   HGBA1C 5.3 04/26/2024    ASSESSMENT AND PLAN:  Discussed the following assessment and plan:    ICD-10-CM   1. Morbid obesity (HCC)  E66.01     2. Headache, unspecified headache type  R51.9     3. Essential hypertension  I10     4. Medication management  Z79.899     5. S/P cholecystectomy  Z90.49     6. OSA on CPAP  G47.33     Tolerating initial dose agree with increase to the 5 mg weekly of Zepbound . Expectant management Will take her medicine and add nasal cortisone and send in some blood pressure readings to make sure it comes down if remains over 160 plan follow-up contact for plan.  Plan follow-up in 1 to 2 months on newer dose or as indicated depending on blood pressure readings.  Counseled.   Expectant management and discussion of plan and treatment with opportunity to ask questions and all were answered. The patient agreed with the plan and demonstrated an understanding of the instructions.   Advised to call back or seek an in-person evaluation if worsening  or having  further concerns  in interim. Return for 1-2 months med check or  as indicaetd if bp not controlled .    Apolinar Eastern, MD

## 2024-08-28 ENCOUNTER — Other Ambulatory Visit (HOSPITAL_COMMUNITY): Payer: Self-pay

## 2024-08-28 ENCOUNTER — Telehealth: Payer: Self-pay | Admitting: Internal Medicine

## 2024-08-28 NOTE — Telephone Encounter (Signed)
 Copied from CRM #[id number]. Topic: General - Other >> Aug 28, 2024  9:20 AM Tina Mckee wrote: Reason for RMF:ejupzwu called stating insurance is not covering her zepbound  anymore and she would like to look into other options CB [phone]

## 2024-08-29 ENCOUNTER — Telehealth: Payer: Self-pay | Admitting: *Deleted

## 2024-08-29 NOTE — Telephone Encounter (Signed)
 Jon can you help? She has obesity ht and hx of CVA  why would the zepbound  be approved and then de nied when we sen in 5 mg dose thanks

## 2024-08-29 NOTE — Telephone Encounter (Signed)
 Duplicate

## 2024-08-29 NOTE — Telephone Encounter (Signed)
 Copied from CRM #[id number]. Topic: General - Other >> Aug 28, 2024  9:20 AM Rosina BIRCH wrote: Reason for RMF:ejupzwu called stating insurance is not covering her zepbound  anymore and she would like to look into other options CB [phone] >> Aug 29, 2024  2:39 PM Thersia C wrote: Patient called in regarding medication, would like for Dr.Panosh and nurse to followup on what she needs to do as she can not afford the medication

## 2024-08-30 NOTE — Telephone Encounter (Signed)
 Tried to submit a prior authorization for Zepbound  via CoverMyMeds, was informed this was not an option for this medication. Contacted insurance company to clarify.  As of 08/14/24, her insurance plan considers Wegovy and Zepbound  complete plan exclusions and will not cover for any medical purpose per the insurance rep.   Notified patient. Reviewed self pay program options, patient declines today.

## 2024-08-31 ENCOUNTER — Other Ambulatory Visit (HOSPITAL_COMMUNITY): Payer: Self-pay

## 2024-09-09 ENCOUNTER — Other Ambulatory Visit: Payer: Self-pay | Admitting: Internal Medicine

## 2024-09-11 DIAGNOSIS — G4733 Obstructive sleep apnea (adult) (pediatric): Secondary | ICD-10-CM | POA: Diagnosis not present

## 2024-09-11 NOTE — Telephone Encounter (Unsigned)
 Copied from CRM #[id number]. Topic: Referral - Prior Authorization Question >> Sep 11, 2024 12:07 PM Roselie BROCKS wrote: Reason for CRM: Patient requests a prior authorization be sent to her insurance [employer] shield, blue option. Zepbound  pt states insurance will over it if on the prior authorization it lists sleep apnea ,high cholesterol , and high blood pressure due to family history

## 2024-09-20 ENCOUNTER — Other Ambulatory Visit (HOSPITAL_COMMUNITY): Payer: Self-pay

## 2024-09-20 ENCOUNTER — Telehealth: Payer: Self-pay

## 2024-09-20 NOTE — Telephone Encounter (Signed)
 Copied from CRM [id number]. Topic: General - Other >> Sep 20, 2024  3:21 PM Larissa S wrote: Reason for CRM: Patient returning missed call. Advised patient of note and she states she would like to try the [facility].

## 2024-09-20 NOTE — Telephone Encounter (Signed)
 Attempted to reach pt. Left a voicemail to call us back.

## 2024-09-20 NOTE — Telephone Encounter (Signed)
 Pharmacy Patient Advocate Encounter   Received notification from Pt Calls Messages that prior authorization for Zepbound  5 is required/requested.   Insurance verification completed.   The patient is insured through [facility].   Per test claim: Per test claim, medication is not covered due to plan/benefit exclusion, PA not submitted at this time

## 2024-09-20 NOTE — Telephone Encounter (Signed)
 Per test claim:   Tina Mckee is indicated for CVA. If you feel that it is appropriate, we can try [facility].

## 2024-09-23 NOTE — Telephone Encounter (Signed)
 Ok to try wegovy beginning dose 0.25  per week  disp 1 mos refill x1      dx hx of cva  and OSA  Obesity but  still may not be covered

## 2024-09-26 MED ORDER — WEGOVY 0.25 MG/0.5ML ~~LOC~~ SOAJ: 0.2500 mg | SUBCUTANEOUS | 0 refills | Status: DC

## 2024-09-26 NOTE — Telephone Encounter (Signed)
(   hought I already answered this in a different thread.)    I Oked to send in starter  dose of wegovy but  may not  be covered either . Let me know if further action is needed

## 2024-09-26 NOTE — Telephone Encounter (Signed)
 Attempted to reach pt. Left a voicemail to call us back.

## 2024-09-27 ENCOUNTER — Telehealth: Admitting: Internal Medicine

## 2024-09-27 DIAGNOSIS — I10 Essential (primary) hypertension: Secondary | ICD-10-CM

## 2024-09-27 DIAGNOSIS — I69351 Hemiplegia and hemiparesis following cerebral infarction affecting right dominant side: Secondary | ICD-10-CM | POA: Diagnosis not present

## 2024-09-27 DIAGNOSIS — G4733 Obstructive sleep apnea (adult) (pediatric): Secondary | ICD-10-CM

## 2024-09-27 NOTE — Telephone Encounter (Signed)
 Spoke to pt and inform her insurance is not [employer]. Offer to discuss with provider for alternatives. Pt verbalized ok. Appt is set for this afternoon at 4:00pm. Pt reports Bp 118/87 this am. Will check it again later.

## 2024-09-27 NOTE — Progress Notes (Signed)
 Virtual Visit via Video Note  I connected with Tina Mckee on 09/27/24 at  4:00 PM EDT by a video enabled telemedicine application and verified that I am speaking with the correct person using two identifiers. Location patient: home Location provider:work office Persons participating in the virtual visit: patient, provider   Patient aware  of the limitations of evaluation and management by telemedicine and  availability of in person appointments. and agreed to proceed.   HPI: Tina Mckee presents for video visit insurance rejected  wegovy also  in addition to zepbound  had covered first month /w success and then  not covered  second month .  Working going to gyme  bike  and some other exercise   balance not safe enough for elliptical  Leg has dec in siize so  brace needs readjustment Oct 21   Bp seem to be better in control   ROS: See pertinent positives and negatives per HPI.  Past Medical History:  Diagnosis Date   Asthma    Gestational hypertension    Perinatal arterial ischemic stroke (HCC)    Scoliosis    Sleep apnea    Stroke [facility]) birth   rt hemiparesis  Perinatal   Supervision of other normal pregnancy 05/04/2014    Past Surgical History:  Procedure Laterality Date   CHOLECYSTECTOMY N/A 07/02/2024   Procedure: LAPAROSCOPIC CHOLECYSTECTOMY WITH INTRAOPERATIVE CHOLANGIOGRAM;  Surgeon: Ann Fine, MD;  Location: [facility] OR;  Service: General;  Laterality: N/A;   FOOT SURGERY     rt foot   FOOT SURGERY     Bone placed   moles removed     from back    Family History  Problem Relation Age of Onset   Hypertension Mother    Asthma Mother    Hypertension Father    Sleep apnea Father    Asthma Sister    Cancer Maternal Grandfather        pancreatic   Cancer Paternal Grandmother        cancer all over   Other Paternal Grandfather        brain damage   Other Maternal Grandmother        heart problems    Social History   Tobacco Use   Smoking  status: Former    Current packs/day: 0.00    Types: Cigarettes    Quit date: 07/11/2007    Years since quitting: 17.2   Smokeless tobacco: Never  Vaping Use   Vaping status: Never Used  Substance Use Topics   Alcohol use: Yes    Alcohol/week: 1.0 standard drink of alcohol    Types: 1 Standard drinks or equivalent per week    Comment: once a month   Drug use: No      Current Outpatient Medications:    acetaminophen  (TYLENOL ) 500 MG tablet, Take 2 tablets (1,000 mg total) by mouth every 6 (six) hours as needed for mild pain (pain score 1-3) or moderate pain (pain score 4-6)., Disp: , Rfl:    amLODipine  (NORVASC ) 5 MG tablet, TAKE 1 TABLET(5 MG) BY MOUTH DAILY, Disp: 90 tablet, Rfl: 1   betamethasone , augmented, (DIPROLENE ) 0.05 % lotion, Apply 1 Application topically daily as needed (Irritation)., Disp: , Rfl:    bismuth subsalicylate (PEPTO BISMOL) 262 MG chewable tablet, Chew 524 mg by mouth as needed for indigestion., Disp: , Rfl:    calcium  carbonate (TUMS - DOSED IN MG ELEMENTAL CALCIUM ) 500 MG chewable tablet, Chew 1 tablet by mouth as needed for  indigestion or heartburn., Disp: , Rfl:    Ciclopirox 1 % shampoo, Apply 1 Application topically 3 (three) times a week., Disp: , Rfl:    citalopram  (CELEXA ) 40 MG tablet, TAKE 1 TABLET(40 MG) BY MOUTH DAILY, Disp: 30 tablet, Rfl: 3   clonazePAM  (KLONOPIN ) 0.5 MG tablet, Take 0.5 tablets (0.25 mg total) by mouth 2 (two) times daily., Disp: 30 tablet, Rfl: 2   fluticasone  (FLONASE ) 50 MCG/ACT nasal spray, SHAKE LIQUID AND USE 1 SPRAY IN EACH NOSTRIL TWICE DAILY, Disp: 16 g, Rfl: 0   levonorgestrel  (MIRENA , 52 MG,) 20 MCG/DAY IUD, Provided by [facility], Disp: , Rfl:    Multiple Vitamins-Calcium  (ONE-A-DAY WOMENS PO), Take 1 tablet by mouth daily., Disp: , Rfl:    PROAIR  HFA 108 (90 Base) MCG/ACT inhaler, INHALE 1-2 PUFFS INTO THE LUNGS EVERY 6 (SIX) HOURS AS NEEDED FOR WHEEZING OR SHORTNESS OF BREATH., Disp: 8.5 Inhaler, Rfl: 0   VITAMIN E  PO, Take 1 tablet by mouth daily., Disp: , Rfl:   EXAM: BP Readings from Last 3 Encounters:  09/27/24 128/87  08/23/24 (!) 180/82  08/21/24 (!) 138/90   VITALS per patient if applicable:  GENERAL: alert, oriented, appears well and in no acute distress  HEENT: atraumatic, conjunttiva clear, no obvious abnormalities on inspection of external nose and ears  NECK: normal movements of the head and neck  LUNGS: on inspection no signs of respiratory distress, breathing rate appears normal, no obvious gross SOB, gasping or wheezing  CV: no obvious cyanosis PSYCH/NEURO: pleasant and cooperative, no obvious depression or anxiety, speech and thought processing grossly intact Lab Results  Component Value Date   WBC 8.0 07/18/2024   HGB 14.6 07/18/2024   HCT 42.2 07/18/2024   PLT 482.0 (H) 07/18/2024   GLUCOSE 137 (H) 07/03/2024   CHOL 213 (H) 04/26/2024   TRIG 168.0 (H) 04/26/2024   HDL 43.90 04/26/2024   LDLDIRECT 161.0 12/16/2021   LDLCALC 135 (H) 04/26/2024   ALT 24 07/18/2024   AST 15 07/18/2024   NA 138 07/03/2024   K 3.8 07/03/2024   CL 102 07/03/2024   CREATININE 0.52 07/03/2024   BUN 5 (L) 07/03/2024   CO2 26 07/03/2024   TSH 1.78 04/26/2024   HGBA1C 5.3 04/26/2024    ASSESSMENT AND PLAN:  Discussed the following assessment and plan:    ICD-10-CM   1. Morbid obesity (HCC)  E66.01 Amb Ref to Medical Weight Management    2. OSA on CPAP  G47.33 Amb Ref to Medical Weight Management    3. Hemiparesis affecting right side as late effect of cerebrovascular accident (HCC)  I69.351 Amb Ref to Medical Weight Management    4. Essential hypertension  I10 Amb Ref to Medical Weight Management     Unfortunately it appears that her insurance will not cover Zepbound  or Wegovy though she needs easily FDA criteria.  Her obstructive sleep apnea is not severe enough.  Her history of CVA not significant enough for them. Will have her continue activity as possible as she has lost a  few pounds and her blood pressure appears to be controlled at this time. Discussed weight management referral and she is interested and willing. Her initial month of Zepbound  was a positive experience for her and I believe that medication would be helpful in the future. Keep appointment from January and follow-up otherwise as needed. Counseled.   Expectant management and discussion of plan and treatment with opportunity to ask questions and all were answered. The  patient agreed with the plan and demonstrated an understanding of the instructions.   Advised to call back or seek an in-person evaluation if worsening  or having  further concerns  in interim. Return for when planned.    Apolinar Eastern, MD

## 2024-09-28 ENCOUNTER — Encounter (INDEPENDENT_AMBULATORY_CARE_PROVIDER_SITE_OTHER): Payer: Self-pay

## 2024-09-28 NOTE — Telephone Encounter (Signed)
 Please see other encounter.

## 2024-09-28 NOTE — Telephone Encounter (Signed)
 Contacted pt yesterday and set up appt to discuss with provider.

## 2024-10-03 ENCOUNTER — Encounter: Payer: Self-pay | Admitting: Internal Medicine

## 2024-10-03 ENCOUNTER — Encounter: Payer: Self-pay | Admitting: Primary Care

## 2024-10-03 ENCOUNTER — Ambulatory Visit: Payer: BC Managed Care – PPO | Admitting: Primary Care

## 2024-10-03 VITALS — BP 124/70 | HR 95 | Temp 97.9°F | Ht 61.0 in | Wt 237.2 lb

## 2024-10-03 DIAGNOSIS — G4733 Obstructive sleep apnea (adult) (pediatric): Secondary | ICD-10-CM

## 2024-10-03 NOTE — Telephone Encounter (Signed)
 I SUGGEST  talking with the weight management people about phentermine  or   other meds

## 2024-10-03 NOTE — Patient Instructions (Signed)
 VISIT SUMMARY: Today, we reviewed your progress with CPAP therapy for sleep apnea and discussed your weight management concerns. Your CPAP use is effective, and you are compliant with the therapy. We also explored alternative options for weight loss management due to the high cost of your current medication.  YOUR PLAN: -OBSTRUCTIVE SLEEP APNEA: Obstructive sleep apnea is a condition where your airway becomes blocked during sleep, causing breathing pauses. Your CPAP therapy is working well, with an average use of over 7 hours per night and an effective apnea score of 1.5. Continue using your CPAP machine with the current settings (5 to 15 cm H2O). Try to sleep on your side and avoid alcohol before bed. [employer] will continue to supply your CPAP equipment, and we plan to replace your CPAP machine in August 2026.  -WEIGHT MANAGEMENT: Weight management is important for overall health and can impact sleep apnea. You are currently using a weight loss medication, but the cost is high. We discussed alternative medications like phentermine and Wellbutrin, which you can talk about with your primary care provider. Additionally, consider joining [facility] for more support.  INSTRUCTIONS: Please follow up with your primary care provider to discuss alternative weight loss medications. Consider a referral to [facility] for additional support with weight management.  Follow-up 1 year with [facility] NP for sooner if needed   CPAP and BIPAP Information CPAP and BIPAP use air pressure to keep your airways open and help you breathe well. CPAP and BIPAP use different amounts of pressure. Your health care provider will tell you whether CPAP or BIPAP would be best for you. CPAP stands for continuous positive airway pressure. With CPAP, the amount of pressure stays the same while you breathe in and out. BIPAP stands for bi-level positive airway pressure. With  BIPAP, the amount of pressure will be higher when you breathe in and lower when you breathe out. This allows you to take bigger breaths. CPAP or BIPAP may be used in the hospital or at home. You may need to have a sleep study before your provider can order a device for you to use at home. What are the advantages? CPAP and BIPAP are most often used for obstructive sleep apnea to keep the airways from collapsing when the muscles relax during sleep. CPAP or BIPAP can be used if you have: Chronic obstructive pulmonary disease. Heart failure. Medical conditions that cause muscle weakness. Other problems that cause breathing to be shallow, weak, or difficult. What are the risks? Your provider will talk with you about risks. These may include: Sores on your nose or face caused from the mask, prongs, or nasal pillows. Dry or stuffy nose or nosebleeds. Feeling gassy or bloated. Sinus or lung infection if the equipment is not cleaned well. When should CPAP or BIPAP be used? In most cases, CPAP or BIPAP is used during sleep at night or whenever the main sleep time happens. It's also used during naps. People with some medical conditions may need to wear the mask when they're awake. Follow instructions from your provider about when to use your CPAP or BIPAP. What happens during CPAP or BIPAP?  Both CPAP and BIPAP use a small machine that uses electricity to create air pressure. A long tube connects the device to a plastic mask. Air is blown through the mask into your nose or mouth. The amount of pressure that's used to blow the air can be adjusted. Your provider will  set the pressure setting and help you find the best mask for you. Tips for using the mask There are different types and sizes of masks. If your mask does not fit well, talk with your provider about getting a different one. Some common types of masks include: Full face masks, which fit over the mouth and nose. Nasal masks, which fit over the  nose. Nasal pillow or prong masks, which fit into the nostrils. The mask needs to be snug to your face, so some people feel trapped or closed in at first. If you feel this way, you may need to get used to the mask. Hold the mask loosely over your nose or mouth and then gradually put the the mask on more snugly. Slowly increase the amount of time you use the mask. If you have trouble with your mask not fitting well or leaking, talk with your provider. Do not stop using the mask. Tips for using the device Follow instructions from your provider about how to and how often to use the device. For home use, CPAP and BIPAP devices come from home health care companies. There are many different brands. Your health insurance company will help to decide which device you get. Keep the CPAP or BIPAP device and attachments clean. Ask your home health care company or check the instruction book for cleaning instructions. Make sure the humidifier is filled with germ-free (sterile) water and is working correctly. This will help prevent a dry or stuffy nose or nosebleeds. A nasal saline mist or spray may keep your nose from getting dry and sore. Do not eat or drink while the CPAP or BIPAP device is on. Food or drinks could get pushed into your lungs by the pressure of the CPAP or BIPAP. Follow these instructions at home: Take over-the-counter and prescription medicines only as told by your provider. Do not smoke, vape, or use nicotine or tobacco. Contact a health care provider if: You have redness or pressure sores on your head, face, mouth, or nose from the mask or headgear. You have trouble using the CPAP or BIPAP device. You have trouble going to sleep or staying asleep. Someone tells you that you snore even when wearing your CPAP or BIPAP device. Get help right away if: You have trouble breathing. You feel confused. These symptoms may be an emergency. Get help right away. Call 911. Do not wait to see if the  symptoms will go away. Do not drive yourself to the hospital. This information is not intended to replace advice given to you by your health care provider. Make sure you discuss any questions you have with your health care provider. Document Revised: 03/24/2023 Document Reviewed: 03/24/2023 Elsevier Patient Education  2024 [employer].

## 2024-10-03 NOTE — Progress Notes (Signed)
 @Patient  ID: Tina Mckee, female    DOB: 03/08/1991, 33 y.o.   MRN: [id number]  Chief Complaint  Patient presents with   Obstructive Sleep Apnea    CPAP F/U    Referring provider: Charlett Apolinar POUR, MD  HPI: 33 year old female, former smoker quit in 2008.  Past medical history significant for OSA.  Home sleep study showed mild OSA, AHI 13/hour (events were severe during supine sleep).  10/03/2024 Discussed the use of AI scribe software for clinical note transcription with the patient, who gave verbal consent to proceed.  History of Present Illness Tina Mckee is a 33 year old female with mild sleep apnea who presents for an annual check-in regarding her CPAP use.  Diagnosed with mild sleep apnea in 2021, she experiences 13 apneic events per hour, particularly severe when supine. She uses a CPAP machine with auto settings (pressure range 5-15) and averages 7 hours and 12 minutes of use per night. Her apnea score is 1.5, and she is fully compliant with CPAP use, though she occasionally removes it due to discomfort, such as puffy eyes, especially during weather changes. Her DME company, [employer], supplies her equipment.  She struggles with weight management and previously used a starter dose of Zepbound , which was affordable with a coupon. However, the cost increased significantly with a higher dose, causing frustration due to lack of insurance coverage for weight loss medications as she is not diabetic. Her weight was 224 pounds about a month and a half ago.  Her mother also has sleep apnea and uses a BiPAP machine but finds the pressure too high and is non-compliant. She notes her mother's condition is worse than hers.      Allergies  Allergen Reactions   Cephalexin Rash and Itching   Vibramycin  [Doxycycline ] Itching and Rash    Immunization History  Administered Date(s) Administered   DTP 06/22/1989, 09/02/1989, 10/26/1989, 01/04/1991, 05/11/1994   HIB (PRP-OMP)  10/26/1989, 05/28/1990, 07/22/1990   Hepatitis B 08/31/2000, 10/05/2000, 02/22/2001   Influenza Split 09/21/2011, 09/27/2012   Influenza, Seasonal, Injecte, Preservative Fre 12/28/2023   Influenza,inj,Quad PF,6+ Mos 09/29/2013, 09/25/2014, 12/19/2015, 10/13/2016, 10/15/2017   Influenza-Unspecified 10/15/2017, 10/19/2018, 11/08/2019, 10/19/2020, 12/02/2021, 12/24/2022   MMR 07/22/1990, 05/11/1994   Moderna Sars-Covid-2 Vaccination 02/10/2020, 03/09/2020, 10/19/2020   OPV 06/22/1989, 09/02/1989, 01/04/1991, 05/11/1994   PFIZER Comirnaty(Gray Top)Covid-19 Tri-Sucrose Vaccine 12/02/2021   Td 07/15/2001   Tdap 08/03/2011, 11/14/2014   Unspecified SARS-COV-2 Vaccination 12/24/2022    Past Medical History:  Diagnosis Date   Asthma    Gestational hypertension    Perinatal arterial ischemic stroke (HCC)    Scoliosis    Sleep apnea    Stroke (HCC) birth   rt hemiparesis  Perinatal   Supervision of other normal pregnancy 05/04/2014    Tobacco History: Social History   Tobacco Use  Smoking Status Former   Current packs/day: 0.00   Types: Cigarettes   Quit date: 07/11/2007   Years since quitting: 17.2  Smokeless Tobacco Never   Counseling given: Not Answered   Outpatient Medications Prior to Visit  Medication Sig Dispense Refill   acetaminophen  (TYLENOL ) 500 MG tablet Take 2 tablets (1,000 mg total) by mouth every 6 (six) hours as needed for mild pain (pain score 1-3) or moderate pain (pain score 4-6).     amLODipine  (NORVASC ) 5 MG tablet TAKE 1 TABLET(5 MG) BY MOUTH DAILY 90 tablet 1   betamethasone , augmented, (DIPROLENE ) 0.05 % lotion Apply 1 Application topically daily as needed (Irritation).  bismuth subsalicylate (PEPTO BISMOL) 262 MG chewable tablet Chew 524 mg by mouth as needed for indigestion.     calcium  carbonate (TUMS - DOSED IN MG ELEMENTAL CALCIUM ) 500 MG chewable tablet Chew 1 tablet by mouth as needed for indigestion or heartburn.     Ciclopirox 1 % shampoo Apply  1 Application topically 3 (three) times a week.     citalopram  (CELEXA ) 40 MG tablet TAKE 1 TABLET(40 MG) BY MOUTH DAILY 30 tablet 3   clonazePAM  (KLONOPIN ) 0.5 MG tablet Take 0.5 tablets (0.25 mg total) by mouth 2 (two) times daily. 30 tablet 2   fluticasone  (FLONASE ) 50 MCG/ACT nasal spray SHAKE LIQUID AND USE 1 SPRAY IN EACH NOSTRIL TWICE DAILY 16 g 0   levonorgestrel  (MIRENA , 52 MG,) 20 MCG/DAY IUD Provided by [facility]     Multiple Vitamins-Calcium  (ONE-A-DAY WOMENS PO) Take 1 tablet by mouth daily.     PROAIR  HFA 108 (90 Base) MCG/ACT inhaler INHALE 1-2 PUFFS INTO THE LUNGS EVERY 6 (SIX) HOURS AS NEEDED FOR WHEEZING OR SHORTNESS OF BREATH. 8.5 Inhaler 0   VITAMIN E PO Take 1 tablet by mouth daily.     No facility-administered medications prior to visit.      Review of Systems  Review of Systems  Constitutional:  Positive for unexpected weight change. Negative for fatigue.  Respiratory: Negative.     Physical Exam  BP 124/70   Pulse 95   Temp 97.9 F (36.6 C)   Ht 5' 1 (1.549 m)   Wt 237 lb 3.2 oz (107.6 kg)   SpO2 99% Comment: RA  BMI 44.82 kg/m  Physical Exam Constitutional:      Appearance: Normal appearance. She is well-developed.  HENT:     Head: Normocephalic and atraumatic.     Mouth/Throat:     Mouth: Mucous membranes are moist.     Pharynx: Oropharynx is clear.  Eyes:     Pupils: Pupils are equal, round, and reactive to light.  Cardiovascular:     Rate and Rhythm: Normal rate and regular rhythm.     Heart sounds: Normal heart sounds. No murmur heard. Pulmonary:     Effort: Pulmonary effort is normal. No respiratory distress.     Breath sounds: Normal breath sounds. No wheezing or rhonchi.  Abdominal:     Tenderness: There is no abdominal tenderness.  Musculoskeletal:        General: Normal range of motion.     Cervical back: Normal range of motion and neck supple.  Skin:    General: Skin is warm and dry.     Findings: No erythema or rash.   Neurological:     General: No focal deficit present.     Mental Status: She is alert and oriented to person, place, and time. Mental status is at baseline.  Psychiatric:        Mood and Affect: Mood normal.        Behavior: Behavior normal.        Thought Content: Thought content normal.        Judgment: Judgment normal.     CBC    Component Value Date/Time   WBC 8.0 07/18/2024 0847   RBC 4.84 07/18/2024 0847   HGB 14.6 07/18/2024 0847   HCT 42.2 07/18/2024 0847   PLT 482.0 (H) 07/18/2024 0847   MCV 87.3 07/18/2024 0847   MCH 30.0 07/03/2024 0721   MCHC 34.5 07/18/2024 0847   RDW 12.5 07/18/2024 0847   LYMPHSABS  2.5 07/18/2024 0847   MONOABS 0.5 07/18/2024 0847   EOSABS 0.3 07/18/2024 0847   BASOSABS 0.1 07/18/2024 0847    BMET    Component Value Date/Time   NA 138 07/03/2024 0721   K 3.8 07/03/2024 0721   CL 102 07/03/2024 0721   CO2 26 07/03/2024 0721   GLUCOSE 137 (H) 07/03/2024 0721   BUN 5 (L) 07/03/2024 0721   CREATININE 0.52 07/03/2024 0721   CREATININE 0.43 (L) 12/05/2014 1148   CALCIUM  9.3 07/03/2024 0721   GFRNONAA >60 07/03/2024 0721   GFRAA >60 09/12/2019 2129    BNP No results found for: BNP  ProBNP No results found for: PROBNP  Imaging: No results found.   Assessment & Plan:   No problem-specific Assessment & Plan notes found for this encounter.   1. OSA (obstructive sleep apnea) (Primary)   Assessment and Plan Assessment & Plan Obstructive sleep apnea Obstructive sleep apnea is well-managed with CPAP therapy. She reports no significant issues with CPAP use, achieving an average of 7 hours and 12 minutes of use per night. The apnea-hypopnea index (AHI) is 1.5, indicating effective management. Occasional removal of the CPAP mask due to discomfort from environmental factors such as pressure changes and puffy eyes is noted but not concerning. Compliance is high, with 100% usage and 90% of nights achieving more than 4 hours of use. She  has lost weight since the original sleep study in 2021, which may impact the severity of sleep apnea.  - Continue CPAP therapy with current settings (auto settings 5 to 15 cm H2O). - Encourage side sleeping position, avoidance of alcohol before bed and driving when tired. - Renew CPAP supplies with [employer]  - Plan for CPAP machine replacement in August 2026, as insurance typically covers replacement every 5 years.  Weight management She is frustrated with insurance coverage for weight loss medications. She is not diabetic and has been unable to get coverage for Zepbound . Explored alternative weight loss options, including phentermine and Wellbutrin, which may be discussed with her primary care provider. Encouraged consideration of [facility] for further assistance with weight management. - Discuss phentermine and Wellbutrin with primary care provider for weight management. - Consider referral to [facility] for additional support.  Almarie LELON Ferrari, NP 10/03/2024

## 2024-10-09 ENCOUNTER — Encounter (INDEPENDENT_AMBULATORY_CARE_PROVIDER_SITE_OTHER): Payer: Self-pay | Admitting: Nurse Practitioner

## 2024-10-09 ENCOUNTER — Ambulatory Visit (INDEPENDENT_AMBULATORY_CARE_PROVIDER_SITE_OTHER): Admitting: Nurse Practitioner

## 2024-10-09 VITALS — BP 131/85 | HR 89 | Temp 97.7°F | Ht 62.0 in | Wt 236.0 lb

## 2024-10-09 DIAGNOSIS — I10 Essential (primary) hypertension: Secondary | ICD-10-CM | POA: Diagnosis not present

## 2024-10-09 DIAGNOSIS — I69351 Hemiplegia and hemiparesis following cerebral infarction affecting right dominant side: Secondary | ICD-10-CM

## 2024-10-09 DIAGNOSIS — G4733 Obstructive sleep apnea (adult) (pediatric): Secondary | ICD-10-CM | POA: Diagnosis not present

## 2024-10-09 DIAGNOSIS — E66813 Obesity, class 3: Secondary | ICD-10-CM

## 2024-10-09 DIAGNOSIS — E782 Mixed hyperlipidemia: Secondary | ICD-10-CM

## 2024-10-09 DIAGNOSIS — Z0289 Encounter for other administrative examinations: Secondary | ICD-10-CM

## 2024-10-09 DIAGNOSIS — Z6841 Body Mass Index (BMI) 40.0 and over, adult: Secondary | ICD-10-CM

## 2024-10-09 NOTE — Progress Notes (Signed)
 [address] [place], [place], [place] [address] Office: [phone]  /  Fax: [phone]   Initial Consultation    Tina Mckee was seen in clinic today to evaluate for obesity. She is interested in losing weight to improve overall health and reduce the risk of weight related complications. She presents today to review program treatment options, initial physical assessment, and evaluation.    Tina Mckee had a stroke as a baby which has left her right side weakened- partial paralysis of right side. Her husband is an personnel officer and travels most of the year. Will come home once every 30 days.  She is essentially single parent most of the time. Is very busy with 35 yo and 34 year old. She does not work, stays home with kids.   She does have sleep apnea and uses her CPAP 100% of the time- AHI 12.8/hour, O2 nadir 83% sleep study from 2021  Tina Mckee does have hypertension which is currently well controlled with amlodipine  5 mg every day. Denies headaches, chest pain and dizziness.  BP Readings from Last 3 Encounters:  10/09/24 131/85  10/03/24 124/70  09/27/24 128/87   She has a history of hyperlipidemia and is currently trying to lower with nutrition, exercise and weight loss.   Anthropometrics and Bioimpedance Analysis   Body mass index is 43.16 kg/m. Body Fat Mass 51.1 % Visceral Fat Mass Rating : 14   Obesity Related Diseases and Complications  Obesity Quality of Life and Psychosocial Complications: Body image dissatisfaction, Reduced health-related quality of life, and Decrease physical activity and social participation  Cardiometabolic: Dyslipidemia or hypercholesterolemia, Hypertension, DOE, and Fatigue  Biomechanical: Low back pain, Obstructive sleep apnea, and Asthma   Weight Related History  She was referred by: PCP  When asked what they would like to accomplish? She states: Adopt a healthier eating pattern and lifestyle, Improve energy levels and physical activity, 35Improve existing medical conditions, Improve quality of life, Improve appearance, and Improve self-confidence  Weight history: After pregnancy age 33 and 4 she noticed weight gain  Highest weight: 260  Contributing factors: family history of obesity, disruption of circadian rhythm / sleep disordered breathing, consumption of processed foods, use of obesogenic medications: Psychotropic medications, moderate to high levels of stress, chronic skipping of meals, strong orexigenic signaling and/or inadequate inhibitory control , multiple weight loss attempts in the past, sedentary job, hectic pace of life, and self - critic or all-or-none mindset  Prior weight loss attempts: Noom and Balanced Plate / Portion Control  Current or previous pharmacotherapy: GLP-1 + GIP- 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   Response to medication: Lost weight initially but was unable to sustain weight loss  Current nutrition plan: Portion control / smart choices  Greatest challenge with dieting: no weight loss and dieting fatigue.  Current level of physical activity: Other: recumbent bike due to right knee weakness  Barriers to Exercise: orthopedic problems  Readiness and Motivation  On a scale from 0 to 10 How ready are you to make changes to your eating and physical activity to lose weight? 10 How important is it for you to lose weight right now ? 10 How confident are you that you can lose weight if you try? 6  Past Medical History   Past Medical History:  Diagnosis Date   Asthma    Gestational hypertension    Perinatal arterial ischemic stroke (HCC)    Scoliosis    Sleep apnea    Stroke (HCC)  birth   rt hemiparesis  Perinatal   Supervision of other normal pregnancy 05/04/2014     Objective    BP 131/85   Pulse 89   Temp 97.7 F (36.5 C)   Ht 5' 2 (1.575 m)   Wt 236 lb (107 kg)   SpO2 96%   BMI 43.16 kg/m  She was weighed on the bioimpedance scale: Body  mass index is 43.16 kg/m.    General:  Alert, oriented and cooperative. Patient is in no acute distress.  Respiratory: Normal respiratory effort, no problems with respiration noted   Gait: able to ambulate independently  Mental Status: Normal mood and affect. Normal behavior. Normal judgment and thought content.   Diagnostic Data Reviewed  BMET    Component Value Date/Time   NA 138 07/03/2024 0721   K 3.8 07/03/2024 0721   CL 102 07/03/2024 0721   CO2 26 07/03/2024 0721   GLUCOSE 137 (H) 07/03/2024 0721   BUN 5 (L) 07/03/2024 0721   CREATININE 0.52 07/03/2024 0721   CREATININE 0.43 (L) 12/05/2014 1148   CALCIUM  9.3 07/03/2024 0721   GFRNONAA >60 07/03/2024 0721   GFRAA >60 09/12/2019 2129   Lab Results  Component Value Date   HGBA1C 5.3 04/26/2024   HGBA1C 5.2 04/11/2012   No results found for: INSULIN CBC    Component Value Date/Time   WBC 8.0 07/18/2024 0847   RBC 4.84 07/18/2024 0847   HGB 14.6 07/18/2024 0847   HCT 42.2 07/18/2024 0847   PLT 482.0 (H) 07/18/2024 0847   MCV 87.3 07/18/2024 0847   MCH 30.0 07/03/2024 0721   MCHC 34.5 07/18/2024 0847   RDW 12.5 07/18/2024 0847   Iron/TIBC/Ferritin/ %Sat No results found for: IRON, TIBC, FERRITIN, IRONPCTSAT Lipid Panel     Component Value Date/Time   CHOL 213 (H) 04/26/2024 1047   TRIG 168.0 (H) 04/26/2024 1047   HDL 43.90 04/26/2024 1047   CHOLHDL 5 04/26/2024 1047   VLDL 33.6 04/26/2024 1047   LDLCALC 135 (H) 04/26/2024 1047   LDLDIRECT 161.0 12/16/2021 0856   Hepatic Function Panel     Component Value Date/Time   PROT 7.0 07/18/2024 0847   ALBUMIN 4.5 07/18/2024 0847   AST 15 07/18/2024 0847   ALT 24 07/18/2024 0847   ALKPHOS 96 07/18/2024 0847   BILITOT 1.6 (H) 07/18/2024 0847   BILIDIR 0.3 07/18/2024 0847      Component Value Date/Time   TSH 1.78 04/26/2024 1047    Medications  Outpatient Encounter Medications as of 10/09/2024  Medication Sig   acetaminophen  (TYLENOL ) 500 MG  tablet Take 2 tablets (1,000 mg total) by mouth every 6 (six) hours as needed for mild pain (pain score 1-3) or moderate pain (pain score 4-6).   amLODipine  (NORVASC ) 5 MG tablet TAKE 1 TABLET(5 MG) BY MOUTH DAILY   betamethasone , augmented, (DIPROLENE ) 0.05 % lotion Apply 1 Application topically daily as needed (Irritation).   bismuth subsalicylate (PEPTO BISMOL) 262 MG chewable tablet Chew 524 mg by mouth as needed for indigestion.   calcium  carbonate (TUMS - DOSED IN MG ELEMENTAL CALCIUM ) 500 MG chewable tablet Chew 1 tablet by mouth as needed for indigestion or heartburn.   Ciclopirox 1 % shampoo Apply 1 Application topically 3 (three) times a week.   citalopram  (CELEXA ) 40 MG tablet TAKE 1 TABLET(40 MG) BY MOUTH DAILY   clonazePAM  (KLONOPIN ) 0.5 MG tablet Take 0.5 tablets (0.25 mg total) by mouth 2 (two) times daily.   fluticasone  (FLONASE ) 50  MCG/ACT nasal spray SHAKE LIQUID AND USE 1 SPRAY IN EACH NOSTRIL TWICE DAILY   levonorgestrel  (MIRENA , 52 MG,) 20 MCG/DAY IUD Provided by [facility]   Multiple Vitamins-Calcium  (ONE-A-DAY WOMENS PO) Take 1 tablet by mouth daily.   PROAIR  HFA 108 (90 Base) MCG/ACT inhaler INHALE 1-2 PUFFS INTO THE LUNGS EVERY 6 (SIX) HOURS AS NEEDED FOR WHEEZING OR SHORTNESS OF BREATH.   tirzepatide  (ZEPBOUND ) 2.5 MG/0.5ML Pen Inject 2.5 mg into the skin once a week.   VITAMIN E PO Take 1 tablet by mouth daily.   No facility-administered encounter medications on file as of 10/09/2024.     Assessment and Plan   Hemiparesis affecting right side as late effect of cerebrovascular accident (HCC) Continue to wear brace on right leg and limit exercise equipment to those where her balance is controlled.   Essential hypertension Continue Amlodipine  5 mg every day Monitor BP and if consistently >140/90 notify PCP If develops headaches, chest pain, shortness of breath or dizziness go to ER Loss of 10-15% body weight can help improve blood pressures   Mixed  hyperlipidemia Loss of 10-15% body weight can improve lipid levels Continue to focus on limiting saturated fats   OSA on CPAP       Use CPAP 100% of the time       Decreasing body weight by 10-15% can improve AHI   Class 3 severe obesity with serious comorbidity and body mass index (BMI) of 40.0 to 44.9 in adult, unspecified obesity type (HCC)  Obesity Treatment and Action Plan:  Patient will work on garnering support from family and friends to begin weight loss journey. Will work on eliminating or reducing the presence of highly palatable, calorie dense foods in the home. Will complete provided nutritional and psychosocial assessment questionnaire before the next appointment. Will be scheduled for indirect calorimetry to determine resting energy expenditure in a fasting state.  This will allow us  to create a reduced calorie, high-protein meal plan to promote loss of fat mass while preserving muscle mass. Counseled on the health benefits of losing 5%-15% of total body weight. Was counseled on nutritional approaches to weight loss and benefits of reducing processed foods and consuming plant-based foods and high quality protein as part of nutritional weight management. Was counseled on pharmacotherapy and role as an adjunct in weight management.   Education and Additional resources  She was weighed on the bioimpedance scale and results were discussed and documented in the synopsis.  We discussed obesity as a progressive, chronic disease and the importance of a more detailed evaluation of all the factors contributing to the disease.  We reviewed the basic principles in obesity management.   We discussed the importance of long term lifestyle changes which include nutrition, exercise and behavioral modification as well as the importance of customizing this to her specific health and social needs.  We reviewed the role of medical interventions including pharmacotherapy and surgical  interventions.   We discussed the benefits of reaching a healthier weight to alleviate the symptoms of existing conditions and reduce the risks of the biomechanical, cardiometabolic and psychological effects of obesity.  We reviewed our program approach and philosophy, which are guided by the four pillars of obesity medicine.  We discussed how to prepare for intake appointment and the importance of fasting and avoidance of stimulants for at least 8 hours prior to indirect calorimetry.  Lateshia Dispenza appears to be in the action stage of change and reports being ready to initiate intensive  lifestyle and behavioral modifications as part of their weight loss journey.  Attestation  Reviewed by clinician on day of visit: allergies, medications, problem list, medical history, surgical history, family history, social history, and previous encounter notes pertinent to obesity diagnosis.  I personally spent a total of 31 minutes in the care of the patient today including preparing to see the patient, getting/reviewing separately obtained history, performing a medically appropriate exam/evaluation, counseling and educating, and documenting clinical information in the EHR.   Neddie Steedman ANP-C

## 2024-10-16 ENCOUNTER — Other Ambulatory Visit: Payer: Self-pay | Admitting: Internal Medicine

## 2024-10-30 ENCOUNTER — Encounter (INDEPENDENT_AMBULATORY_CARE_PROVIDER_SITE_OTHER): Payer: Self-pay | Admitting: Nurse Practitioner

## 2024-10-30 ENCOUNTER — Ambulatory Visit (INDEPENDENT_AMBULATORY_CARE_PROVIDER_SITE_OTHER): Payer: Self-pay | Admitting: Nurse Practitioner

## 2024-10-30 VITALS — BP 117/79 | HR 76 | Temp 97.7°F | Ht 62.0 in | Wt 236.0 lb

## 2024-10-30 DIAGNOSIS — G4733 Obstructive sleep apnea (adult) (pediatric): Secondary | ICD-10-CM

## 2024-10-30 DIAGNOSIS — F321 Major depressive disorder, single episode, moderate: Secondary | ICD-10-CM

## 2024-10-30 DIAGNOSIS — R5383 Other fatigue: Secondary | ICD-10-CM | POA: Diagnosis not present

## 2024-10-30 DIAGNOSIS — I69351 Hemiplegia and hemiparesis following cerebral infarction affecting right dominant side: Secondary | ICD-10-CM

## 2024-10-30 DIAGNOSIS — Z1331 Encounter for screening for depression: Secondary | ICD-10-CM

## 2024-10-30 DIAGNOSIS — E782 Mixed hyperlipidemia: Secondary | ICD-10-CM | POA: Diagnosis not present

## 2024-10-30 DIAGNOSIS — Z6841 Body Mass Index (BMI) 40.0 and over, adult: Secondary | ICD-10-CM | POA: Diagnosis not present

## 2024-10-30 DIAGNOSIS — E559 Vitamin D deficiency, unspecified: Secondary | ICD-10-CM

## 2024-10-30 DIAGNOSIS — E66813 Obesity, class 3: Secondary | ICD-10-CM

## 2024-10-30 DIAGNOSIS — I10 Essential (primary) hypertension: Secondary | ICD-10-CM

## 2024-10-30 DIAGNOSIS — R0602 Shortness of breath: Secondary | ICD-10-CM | POA: Diagnosis not present

## 2024-10-30 NOTE — Progress Notes (Signed)
 [address] [place],  [place], [place] [address]  Office: [phone]  /  Fax: [phone]   Subjective   Initial Visit  Tina Mckee (MR# [id number]) is a 33 y.o. female who presents for evaluation and treatment of obesity and related comorbidities. Current BMI is Body mass index is 43.16 kg/m. Tina Mckee has been struggling with her weight for many years and has been unsuccessful in either losing weight, maintaining weight loss, or reaching her healthy weight goal.  Poetry is currently in the action stage of change and ready to dedicate time achieving and maintaining a healthier weight. Tina Mckee is interested in becoming our patient and working on intensive lifestyle modifications including (but not limited to) diet and exercise for weight loss.  Tina Mckee had a stroke as a baby which has left her right side weakened- partial paralysis of right side. Her husband is an personnel officer and travels most of the year. Will come home once every 30 days.  She is essentially single parent most of the time. Is very busy with 35 yo and 35 year old. She does not work, stays home with kids. She is going on a cruise over Thanksgiving week but does plan to start the meal plan and eat healthily during the cruise   She does have sleep apnea and uses her CPAP 100% of the time- AHI 12.8/hour, O2 nadir 83% sleep study from 2021   Tina Mckee does have hypertension which is currently well controlled with amlodipine  5 mg every day. Denies headaches, chest pain and dizziness.  BP Readings from Last 3 Encounters:  10/30/24 117/79  10/09/24 131/85  10/03/24 124/70     She has a history of hyperlipidemia and is currently trying to lower with nutrition, exercise and weight loss.  Weight history:  When asked how their weight has affected their life and health, she states: Has affected self-esteem, Contributed to medical problems, Contributed to orthopedic problems or mobility issues, Having fatigue, Having poor endurance, and  Has affected mood   When asked what else they would like to accomplish? She states: Adopt a healthier eating pattern and lifestyle, Improve energy levels and physical activity, Improve existing medical conditions, Improve quality of life, Improve appearance, and Improve self-confidence  She starting to note weight gain during : adulthood and pregnancy.  Life events associated with weight gain include : pregnancy.   Other contributing factors: family history of obesity, disruption of circadian rhythm / sleep disordered breathing, consumption of processed foods, use of obesogenic medications: Psychotropic medications, moderate to high levels of stress, reduced physical activity, chronic skipping of meals, strong orexigenic signaling and/or inadequate inhibitory control , multiple weight loss attempts in the past, sedentary job, hectic pace of life, need for convenient foods, and self - critic or all-or-none mindset.  Their highest weight has been:  236 lbs.  Desired weight: 178  Previous weight-loss programs : Noom and Balanced Plate / Portion Control.  Their maximum weight loss was:  12 lbs.  Their greatest challenge with dieting: dieting fatigue, difficulty maintaining reduced calorie state, and meal preparation and cooking.  Current or previous pharmacotherapy: GLP-1 + GIP.  Response to medication: Was cost prohibitive or lost coverage for AOM   Nutritional History:  Current nutrition plan: Portion control / smart choices.  How many times do you eat outside the home: 1-2 per week  How often do they skip meals: skips breakfast and skips lunch  What beverages do they drink: water, caffeinated beverages , and alcohol(1 glass of wine a week)  Use of artificial sweetners : No  Food intolerances or dislikes: none.  Food triggers: Stress, Boredom, When feeling guilty, To help comfort self, and When Sad.  Food cravings: Chinese  Do they struggle with excessive hunger or portion  control : Yes    Physical Activity:  Current level of physical activity: Other: recumbent bike 30-45 minutes 5 days a week   Barriers to Exercise: orthopedic problems   Past medical history includes:   Past Medical History:  Diagnosis Date   Anxiety    Asthma    At risk for falling    Back pain    Depression    Gall bladder disease    Gestational hypertension    High cholesterol    HS (hereditary spherocytosis)    Obesity    Perinatal arterial ischemic stroke (HCC)    Prediabetes    Right sided weakness    Scoliosis    Sleep apnea    Stroke [facility]) birth   rt hemiparesis  Perinatal   Supervision of other normal pregnancy 05/04/2014     Objective   BP 117/79   Pulse 76   Temp 97.7 F (36.5 C)   Ht 5' 2 (1.575 m)   Wt 236 lb (107 kg)   SpO2 94%   BMI 43.16 kg/m  She was weighed on the bioimpedance scale: Body mass index is 43.16 kg/m.    Anthropometrics:  Vitals Temp: 97.7 F (36.5 C) BP: 117/79 Pulse Rate: 76 SpO2: 94 %   Anthropometric Measurements Height: 5' 2 (1.575 m) Weight: 236 lb (107 kg) BMI (Calculated): 43.15 Waist Measurement : 46 inches   Body Composition  Body Fat %: 50.4 % Fat Mass (lbs): 119.2 lbs Muscle Mass (lbs): 111.2 lbs Total Body Water (lbs): 87.6 lbs Visceral Fat Rating : 14   Other Clinical Data RMR: 1944 Fasting: yes Labs: yes Today's Visit #: 1 Starting Date: 10/30/24    Physical Exam:  General: She is overweight, cooperative, alert, well developed, and in no acute distress. PSYCH: Has normal mood, affect and thought process.   HEENT: EOMI, sclerae are anicteric. Lungs: Normal breathing effort, no conversational dyspnea. Extremities: No edema.  Neurologic: Partial paresis of  right hand/leg/face. No tremors or fasciculations noted.    Diagnostic Data Reviewed  EKG: Reviewed EKG from 07/01/24 [facility] NSR 71, no change from previous EKG 08/11/21  Indirect Calorimeter completed today shows a VO2  of 281 and a REE of 1944.  Her calculated basal metabolic rate is 8294 thus her resting energy expenditure faster than calculated.  Depression Screen  Jarelly's PHQ-9 score was: 24. She is followed through PCP Dr. Charlett for depression     10/30/2024    8:53 AM  Depression screen PHQ 2/9  Decreased Interest 3  Down, Depressed, Hopeless 3  PHQ - 2 Score 6  Altered sleeping 3  Tired, decreased energy 3  Change in appetite 3  Feeling bad or failure about yourself  3  Trouble concentrating 3  Moving slowly or fidgety/restless 3  Suicidal thoughts 0  PHQ-9 Score 24  Difficult doing work/chores Extremely dIfficult    Screening for Sleep Related Breathing Disorders  Jasmia admits to daytime somnolence and admits to waking up still tired. Patient has a history of symptoms of daytime fatigue, morning fatigue, Epworth sleepiness scale, morning headache, and hypertension. Jenisse generally gets 6 or 7 hours of sleep per night, and states that she has difficulty falling asleep, nightime awakenings, and difficulty falling back asleep  if awakened. Snoring is present. Apneic episodes are present. Epworth Sleepiness Score is 12. She does have sleep apnea and uses her CPAP 100% of the time  BMET    Component Value Date/Time   NA 138 07/03/2024 0721   K 3.8 07/03/2024 0721   CL 102 07/03/2024 0721   CO2 26 07/03/2024 0721   GLUCOSE 137 (H) 07/03/2024 0721   BUN 5 (L) 07/03/2024 0721   CREATININE 0.52 07/03/2024 0721   CREATININE 0.43 (L) 12/05/2014 1148   CALCIUM  9.3 07/03/2024 0721   GFRNONAA >60 07/03/2024 0721   GFRAA >60 09/12/2019 2129   Lab Results  Component Value Date   HGBA1C 5.3 04/26/2024   HGBA1C 5.2 04/11/2012   No results found for: INSULIN CBC    Component Value Date/Time   WBC 8.0 07/18/2024 0847   RBC 4.84 07/18/2024 0847   HGB 14.6 07/18/2024 0847   HCT 42.2 07/18/2024 0847   PLT 482.0 (H) 07/18/2024 0847   MCV 87.3 07/18/2024 0847   MCH 30.0 07/03/2024  0721   MCHC 34.5 07/18/2024 0847   RDW 12.5 07/18/2024 0847   Iron/TIBC/Ferritin/ %Sat No results found for: IRON, TIBC, FERRITIN, IRONPCTSAT Lipid Panel     Component Value Date/Time   CHOL 213 (H) 04/26/2024 1047   TRIG 168.0 (H) 04/26/2024 1047   HDL 43.90 04/26/2024 1047   CHOLHDL 5 04/26/2024 1047   VLDL 33.6 04/26/2024 1047   LDLCALC 135 (H) 04/26/2024 1047   LDLDIRECT 161.0 12/16/2021 0856   Hepatic Function Panel     Component Value Date/Time   PROT 7.0 07/18/2024 0847   ALBUMIN 4.5 07/18/2024 0847   AST 15 07/18/2024 0847   ALT 24 07/18/2024 0847   ALKPHOS 96 07/18/2024 0847   BILITOT 1.6 (H) 07/18/2024 0847   BILIDIR 0.3 07/18/2024 0847      Component Value Date/Time   TSH 1.78 04/26/2024 1047     Assessment and Plan   Class 3 severe obesity with serious comorbidity and body mass index (BMI) of 40.0 to 44.9 in adult, unspecified obesity type (HCC) TREATMENT PLAN FOR OBESITY:  Recommended Dietary Goals  Dajanique is currently in the action stage of change. As such, her goal is to implement medically supervised obesity management plan.  She has agreed to implement: the Category 2 plan - 1200 kcal per day  Behavioral Intervention  We discussed the following Behavioral Modification Strategies today: increasing lean protein intake to established goals, decreasing simple carbohydrates , increasing vegetables, increasing lower glycemic fruits, increasing fiber rich foods, avoiding skipping meals, increasing water intake, work on meal planning and preparation, reading food labels , keeping healthy foods at home, identifying sources and decreasing liquid calories, decreasing eating out or consumption of processed foods, and making healthy choices when eating convenient foods, planning for success, and better snacking choices  Additional resources provided today: Handout on healthy eating and balanced plate, Handout on complex carbohydrates and lean sources of  protein, Category 2 packet, and Handout principles of weight management  Recommended Physical Activity Goals  Shearon has been advised to work up to 150 minutes of moderate intensity aerobic activity a week and strengthening exercises 2-3 times per week for cardiovascular health, weight loss maintenance and preservation of muscle mass.   She has agreed to :  Think about enjoyable ways to increase daily physical activity and overcoming barriers to exercise, Increase physical activity in their day and reduce sedentary time (increase NEAT)., and Start aerobic activity with a goal of  150 minutes a week at moderate intensity.   Medical Interventions and Pharmacotherapy We will work on building a therapist, art and behavioral strategies. We will discuss the role of pharmacotherapy as an adjunct at subsequent visits.   ASSOCIATED CONDITIONS ADDRESSED TODAY  Other Fatigue Harriet does feel that her weight is causing her energy to be lower than it should be. Fatigue may be related to obesity, depression or many other causes. Labs will be ordered, and in the meanwhile, Naiomy will focus on self care including making healthy food choices, increasing physical activity and focusing on stress reduction.  Shortness of Breath Courtny notes increasing shortness of breath with physical activity and seems to be worsening over time with weight gain. She notes getting out of breath sooner with activity than she used to. This has not gotten worse recently. Janique denies shortness of breath at rest or orthopnea.  Depression screening Depression, major, single episode, moderate (HCC) Depression screening was high but is currently on medication, Celexa  40 mg every day and Klonopin  0.5 mg BID and following with her PCP/ Declines thoughts of hurting self.  Continue to follow closely with PCP. If any thoughts of hurting self she is to go tt the ER.   OSA on CPAP       Use CPAP 100% of the  time       Decreasing body weight by 10-15% can improve AHI  -     CBC with Differential/Platelet -     Comprehensive metabolic panel with GFR   Essential hypertension Continue amlodipine  5 mg every day Start Category 2 meal plan  and DASH diet Monitor BP and if consistently >140/90 notify PCP If develops headaches, chest pain, shortness of breath or dizziness go to ER Loss of 10-15% body weight can help improve blood pressures  -     CBC with Differential/Platelet  Hemiparesis affecting right side as late effect of cerebrovascular accident [facility])       Continue to follow with neurology       Increase activity as tolerated with newly adjusted right leg brace  Mixed hyperlipidemia Focus on implementing category 2 meal plan, limit saturated fats Loss of 10-15% body weight can improve lipid levels Focus on getting 150 minutes a week of moderate to high intensity exercise  -     Comprehensive metabolic panel with GFR -     Lipid panel  Class 3 severe obesity with serious comorbidity and body mass index (BMI) of 40.0 to 44.9 in adult, unspecified obesity type (HCC) Refer to plan above -     CBC with Differential/Platelet -     Comprehensive metabolic panel with GFR -     Hemoglobin A1c -     Insulin, random -     Lipid panel -     Magnesium  -     TSH -     Vitamin B12 -     VITAMIN D 25 Hydroxy (Vit-D Deficiency, Fractures)  Vitamin D deficiency If Vit D level remains low will supplement with Ergocalciferol 50000 units once a week for 12 weeks and then recheck level.   -     VITAMIN D 25 Hydroxy (Vit-D Deficiency, Fractures)    Follow-up  She was informed of the importance of frequent follow-up visits to maximize her success with intensive lifestyle modifications for her multiple health conditions. She was informed we would discuss her lab results at her next visit unless there is a critical issue that needs to  be addressed sooner. Raechelle agreed to keep her next visit at the  agreed upon time to discuss these results.  Attestation Statement  This is the patient's intake visit at [facility]. The patient's Health Questionnaire was reviewed at length. Included in the packet: current and past health history, medications, allergies, ROS, gynecologic history (women only), surgical history, family history, social history, weight history, weight loss surgery history (for those that have had weight loss surgery), nutritional evaluation, mood and food questionnaire, PHQ9, Epworth questionnaire, sleep habits questionnaire, patient life and health improvement goals questionnaire. These will all be scanned into the patient's chart under media.   During the visit, I independently reviewed the patient's EKG from 07/01/2024 done at [facility], previous labs, bioimpedance scale results, and indirect calorimetry results. I used this information to medically tailor a meal plan for the patient that will help her to lose weight and will improve her obesity-related conditions. I performed a medically necessary appropriate examination and/or evaluation. I discussed the assessment and treatment plan with the patient. The patient was provided an opportunity to ask questions and all were answered. The patient agreed with the plan and demonstrated an understanding of the instructions. Labs were ordered at this visit and will be reviewed at the next visit unless critical results need to be addressed immediately. Clinical information was updated and documented in the EMR.   In addition, they received basic education on identification of processed foods and reduction of these, different sources of lean proteins and complex carbohydrates and how to eat balanced by incorporation of whole foods.  Reviewed by clinician on day of visit: allergies, medications, problem list, medical history, surgical history, family history, social history, and previous encounter notes.  I personally spent a  total of 40 minutes in the care of the patient today including preparing to see the patient, getting/reviewing separately obtained history, performing a medically appropriate exam/evaluation, counseling and educating, placing orders, documenting clinical information in the EHR, and independently interpreting results. Excluding time for indirect calorimetry and depression screening    Lonell Liverpool ANP-C

## 2024-10-31 ENCOUNTER — Ambulatory Visit (INDEPENDENT_AMBULATORY_CARE_PROVIDER_SITE_OTHER): Payer: Self-pay | Admitting: Nurse Practitioner

## 2024-10-31 LAB — LIPID PANEL
Chol/HDL Ratio: 4.3 ratio (ref 0.0–4.4)
Cholesterol, Total: 198 mg/dL (ref 100–199)
HDL: 46 mg/dL (ref >39)
LDL Chol Calc (NIH): 113 mg/dL — ABNORMAL HIGH (ref 0–99)
Triglycerides: 223 mg/dL — ABNORMAL HIGH (ref 0–149)
VLDL Cholesterol Cal: 39 mg/dL (ref 5–40)

## 2024-10-31 LAB — VITAMIN B12: Vitamin B-12: 268 pg/mL (ref 232–1245)

## 2024-10-31 LAB — COMPREHENSIVE METABOLIC PANEL WITH GFR
ALT: 18 IU/L (ref 0–32)
AST: 16 IU/L (ref 0–40)
Albumin: 4.7 g/dL (ref 3.9–4.9)
Alkaline Phosphatase: 87 IU/L (ref 41–116)
BUN/Creatinine Ratio: 27 — ABNORMAL HIGH (ref 9–23)
BUN: 16 mg/dL (ref 6–20)
Bilirubin Total: 1 mg/dL (ref 0.0–1.2)
CO2: 23 mmol/L (ref 20–29)
Calcium: 9.8 mg/dL (ref 8.7–10.2)
Chloride: 102 mmol/L (ref 96–106)
Creatinine, Ser: 0.6 mg/dL (ref 0.57–1.00)
Globulin, Total: 2.4 g/dL (ref 1.5–4.5)
Glucose: 89 mg/dL (ref 70–99)
Potassium: 4.6 mmol/L (ref 3.5–5.2)
Sodium: 141 mmol/L (ref 134–144)
Total Protein: 7.1 g/dL (ref 6.0–8.5)
eGFR: 120 mL/min/1.73 (ref >59)

## 2024-10-31 LAB — TSH: TSH: 2.13 u[IU]/mL (ref 0.450–4.500)

## 2024-10-31 LAB — CBC WITH DIFFERENTIAL/PLATELET
Basophils Absolute: 0.1 x10E3/uL (ref 0.0–0.2)
Basos: 1 %
EOS (ABSOLUTE): 0.4 x10E3/uL (ref 0.0–0.4)
Eos: 4 %
Hematocrit: 46.1 % (ref 34.0–46.6)
Hemoglobin: 15 g/dL (ref 11.1–15.9)
Immature Grans (Abs): 0 x10E3/uL (ref 0.0–0.1)
Immature Granulocytes: 0 %
Lymphocytes Absolute: 2.9 x10E3/uL (ref 0.7–3.1)
Lymphs: 27 %
MCH: 30 pg (ref 26.6–33.0)
MCHC: 32.5 g/dL (ref 31.5–35.7)
MCV: 92 fL (ref 79–97)
Monocytes Absolute: 0.5 x10E3/uL (ref 0.1–0.9)
Monocytes: 4 %
Neutrophils Absolute: 6.7 x10E3/uL (ref 1.4–7.0)
Neutrophils: 64 %
Platelets: 563 x10E3/uL — ABNORMAL HIGH (ref 150–450)
RBC: 5 x10E6/uL (ref 3.77–5.28)
RDW: 12.4 % (ref 11.7–15.4)
WBC: 10.6 x10E3/uL (ref 3.4–10.8)

## 2024-10-31 LAB — HEMOGLOBIN A1C
Est. average glucose Bld gHb Est-mCnc: 94 mg/dL
Hgb A1c MFr Bld: 4.9 % (ref 4.8–5.6)

## 2024-10-31 LAB — INSULIN, RANDOM: INSULIN: 19.5 u[IU]/mL (ref 2.6–24.9)

## 2024-10-31 LAB — MAGNESIUM: Magnesium: 1.9 mg/dL (ref 1.6–2.3)

## 2024-10-31 LAB — VITAMIN D 25 HYDROXY (VIT D DEFICIENCY, FRACTURES): Vit D, 25-Hydroxy: 16.4 ng/mL — ABNORMAL LOW (ref 30.0–100.0)

## 2024-11-02 ENCOUNTER — Encounter: Payer: Self-pay | Admitting: Neurology

## 2024-11-13 ENCOUNTER — Encounter (INDEPENDENT_AMBULATORY_CARE_PROVIDER_SITE_OTHER): Payer: Self-pay | Admitting: Nurse Practitioner

## 2024-11-13 ENCOUNTER — Ambulatory Visit (INDEPENDENT_AMBULATORY_CARE_PROVIDER_SITE_OTHER): Payer: Self-pay | Admitting: Nurse Practitioner

## 2024-11-13 VITALS — BP 121/81 | HR 73 | Temp 98.4°F | Ht 62.0 in | Wt 241.0 lb

## 2024-11-13 DIAGNOSIS — E782 Mixed hyperlipidemia: Secondary | ICD-10-CM

## 2024-11-13 DIAGNOSIS — I10 Essential (primary) hypertension: Secondary | ICD-10-CM

## 2024-11-13 DIAGNOSIS — I69351 Hemiplegia and hemiparesis following cerebral infarction affecting right dominant side: Secondary | ICD-10-CM | POA: Diagnosis not present

## 2024-11-13 DIAGNOSIS — G4733 Obstructive sleep apnea (adult) (pediatric): Secondary | ICD-10-CM

## 2024-11-13 DIAGNOSIS — E559 Vitamin D deficiency, unspecified: Secondary | ICD-10-CM

## 2024-11-13 DIAGNOSIS — Z6841 Body Mass Index (BMI) 40.0 and over, adult: Secondary | ICD-10-CM

## 2024-11-13 DIAGNOSIS — E66813 Obesity, class 3: Secondary | ICD-10-CM

## 2024-11-13 DIAGNOSIS — E88819 Insulin resistance, unspecified: Secondary | ICD-10-CM

## 2024-11-13 MED ORDER — VITAMIN D (ERGOCALCIFEROL) 1.25 MG (50000 UNIT) PO CAPS: 50000.0000 [IU] | ORAL_CAPSULE | ORAL | 1 refills | Status: DC

## 2024-11-13 MED ORDER — METFORMIN HCL ER 500 MG PO TB24: 500.0000 mg | ORAL_TABLET | Freq: Every day | ORAL | 0 refills | Status: DC

## 2024-11-13 NOTE — Progress Notes (Signed)
 Office: [phone]  /  Fax: [phone]  WEIGHT SUMMARY AND BIOMETRICS  Weight Lost Since Last Visit: 0  Weight Gained Since Last Visit: 5 lb   Vitals Temp: 98.4 F (36.9 C) BP: 121/81 Pulse Rate: 73 SpO2: 93 %   Anthropometric Measurements Height: 5' 2 (1.575 m) Weight: 241 lb (109.3 kg) BMI (Calculated): 44.07 Weight at Last Visit: 236 LB Weight Lost Since Last Visit: 0 Weight Gained Since Last Visit: 5 lb Starting Weight: 236 LB Total Weight Loss (lbs): 0 lb (0 kg) Waist Measurement : 46 inches   Body Composition  Body Fat %: 51.3 % Fat Mass (lbs): 123.8 lbs Muscle Mass (lbs): 111.6 lbs Total Body Water (lbs): 89.6 lbs Visceral Fat Rating : 15   Other Clinical Data Fasting: NO Labs: NO Today's Visit #: 2 Starting Date: 10/30/24    Total Weight Loss:0 Bio Impedance Data reviewed with patient:  HPI  Chief Complaint: OBESITY  Tina Mckee is here to discuss her progress with her obesity treatment plan. She is on the practicing portion control and making smarter food choices, such as increasing vegetables and decreasing simple carbohydrates and states she is following her eating plan approximately 90 % of the time. She states she is walking for exercise   Interval History:  Since last office visit she was on a cruise over Thanksgiving. She didn't overeat, tried portion control. Had some more simple carbs. She did a lot of walking on the cruise but her right leg did cause some fatigue/pain.  She is ready to start meal plan 2 She has been drinking 64-80 ounces of water.  She does plan to restart recumbent bike and strength training for upper body at [employer].   She continues to use her CPAP 100% of the time.  Danial does have hypertension and BP's are currently well controlled with Amlodipine  5 mg every day.  Denies headaches, chest pain, shortness of breath at rest and dizziness.  BP Readings from Last 3 Encounters:  11/13/24 121/81  10/30/24  117/79  10/09/24 131/85    Lab results are reviewed with patient in detail. Normal electrolytes, fasting glucose, liver enzymes, kidney functions, A1c, thyroid , and vit B12. Vit D is low at 16.4 and would benefit from supplementation. Fasting insulin  is elevated at 19.5 with HOMA-IR of 4.29 indicating insulin  resistance. LDL and triglycerides are elevated on lipid panel.  Elevated platelets on CBC, has been elevated consistently when checked- states mother also has elevated platelets.    Cancer screenings are reviewed with patient as obesity is a risk cancer for certain cancers.   Pap: 12/16/21 - negative   PHYSICAL EXAM:  Blood pressure 121/81, pulse 73, temperature 98.4 F (36.9 C), height 5' 2 (1.575 m), weight 241 lb (109.3 kg), SpO2 93%. Body mass index is 44.08 kg/m.  General: Well Developed, well nourished, and in no acute distress.  HEENT: Normocephalic, atraumatic; EOMI, sclerae are anicteric. Skin: Warm and dry, good turgor Chest:  Normal excursion, shape, no gross ABN Respiratory: No conversational dyspnea; speaking in full sentences NeuroM-Sk:  Normal gross ROM * 4 extremities  Psych: A and O X 3, insight adequate, mood- full    DIAGNOSTIC DATA REVIEWED:  Last metabolic panel Lab Results  Component Value Date   GLUCOSE 89 10/30/2024   NA 141 10/30/2024   K 4.6 10/30/2024   CL 102 10/30/2024   CO2 23 10/30/2024   BUN 16 10/30/2024   CREATININE 0.60 10/30/2024   EGFR 120 10/30/2024   CALCIUM   9.8 10/30/2024   PROT 7.1 10/30/2024   ALBUMIN 4.7 10/30/2024   LABGLOB 2.4 10/30/2024   BILITOT 1.0 10/30/2024   ALKPHOS 87 10/30/2024   AST 16 10/30/2024   ALT 18 10/30/2024   ANIONGAP 10 07/03/2024    Lab Results  Component Value Date   HGBA1C 4.9 10/30/2024   HGBA1C 5.2 04/11/2012   Lab Results  Component Value Date   INSULIN  19.5 10/30/2024   Lab Results  Component Value Date   TSH 2.130 10/30/2024   CBC    Component Value Date/Time   WBC 10.6  10/30/2024 0924   WBC 8.0 07/18/2024 0847   RBC 5.00 10/30/2024 0924   RBC 4.84 07/18/2024 0847   HGB 15.0 10/30/2024 0924   HCT 46.1 10/30/2024 0924   PLT 563 (H) 10/30/2024 0924   MCV 92 10/30/2024 0924   MCH 30.0 10/30/2024 0924   MCH 30.0 07/03/2024 0721   MCHC 32.5 10/30/2024 0924   MCHC 34.5 07/18/2024 0847   RDW 12.4 10/30/2024 0924   Lipid Panel     Component Value Date/Time   CHOL 198 10/30/2024 0924   TRIG 223 (H) 10/30/2024 0924   HDL 46 10/30/2024 0924   CHOLHDL 4.3 10/30/2024 0924   CHOLHDL 5 04/26/2024 1047   VLDL 33.6 04/26/2024 1047   LDLCALC 113 (H) 10/30/2024 0924   LDLDIRECT 161.0 12/16/2021 0856   Nutritional Lab Results  Component Value Date   VD25OH 16.4 (L) 10/30/2024   Lab Results  Component Value Date   VITAMINB12 268 10/30/2024      ASSESSMENT AND PLAN Class 3 severe obesity with serious comorbidity and body mass index (BMI) of 40.0 to 44.9 in adult, unspecified obesity type (HCC)  TREATMENT PLAN FOR OBESITY:  Recommended Dietary Goals  Joshalyn is currently in the action stage of change. As such, her goal is to continue weight management plan. She has agreed to the Category 2 Plan.  Behavioral Intervention  We discussed the following Behavioral Modification Strategies today: increasing lean protein intake to established goals, decreasing simple carbohydrates , increasing fiber rich foods, avoiding skipping meals, increasing water intake , reading food labels , keeping healthy foods at home, better snacking choices, celebration eating strategies- one plate with protein as largest portion, clean vegetable and then portion of anything she wants to try but food cannot touch on the plate, be mindful and enjoy your food  , continue to work on maintaining a reduced calorie state, getting the recommended amount of protein, incorporating whole foods, making healthy choices, staying well hydrated and practicing mindfulness when eating., and increase  protein intake, fibrous foods (25 grams per day for women, 30 grams for men) and water to improve satiety and decrease hunger signals. .  Recommended Physical Activity Goals  Bekki has been advised to work up to 150 minutes of moderate intensity aerobic activity a week and strengthening exercises 2-3 times per week for cardiovascular health, weight loss maintenance and preservation of muscle mass.   She has agreed to Think about enjoyable ways to increase daily physical activity and overcoming barriers to exercise, Increase physical activity in their day and reduce sedentary time (increase NEAT)., and Start aerobic activity with a goal of 150 minutes a week at moderate intensity.    Pharmacotherapy We discussed various medication options to help Marai with her weight loss efforts and we both agreed to start Metformin XR 500 mg 1 tab every day for insulin  resistance and off label for weight loss. Start Ergocalciferol   50000 units once a week for Vit D deficiency.   ASSOCIATED CONDITIONS ADDRESSED TODAY  Action/Plan  OSA on CPAP       Use CPAP 100% of the time       Decreasing body weight by 10-15% can improve AHI   Essential hypertension Continue amlodipine  5 mg every day Start Category 2 meal plan  and DASH diet Monitor BP and if consistently >140/90 notify PCP If develops headaches, chest pain, shortness of breath or dizziness go to ER Follow up regularly with PCP  Mixed hyperlipidemia Focus on implementing category 2 meal plan, limit saturated fats Loss of 10-15% body weight can improve lipid levels Focus on getting 150 minutes a week of moderate to high intensity exercise   Hemiparesis affecting right side as late effect of cerebrovascular accident (HCC) Continue to follow regularly with neurology Will start exercise with recumbent bike as this is easiest with her right leg needing brace due to hemiparesis  Insulin  resistance Start Category 2  meal plan, limit simple  carbohydrates Start Metformin XR 500 mg every day with food. Continue exercise with current goal of 150 minutes of moderate to high intensity exercise/week.  -     metFORMIN HCl ER; Take 1 tablet (500 mg total) by mouth daily with breakfast.  Dispense: 30 tablet; Refill: 0  Vitamin D  deficiency Start to supplement with Ergocalciferol  50000 units once a week for 12 weeks and then recheck level.   -     Vitamin D  (Ergocalciferol ); Take 1 capsule (50,000 Units total) by mouth every 7 (seven) days.  Dispense: 5 capsule; Refill: 1         Return in about 4 weeks (around 12/11/2024).SABRA She was informed of the importance of frequent follow up visits to maximize her success with intensive lifestyle modifications for her multiple health conditions.   ATTESTASTION STATEMENTS:  Reviewed by clinician on day of visit: allergies, medications, problem list, medical history, surgical history, family history, social history, and previous encounter notes.    Elliott Quade ANP-C

## 2024-12-06 ENCOUNTER — Other Ambulatory Visit: Payer: Self-pay | Admitting: Neurology

## 2024-12-11 ENCOUNTER — Other Ambulatory Visit (INDEPENDENT_AMBULATORY_CARE_PROVIDER_SITE_OTHER): Payer: Self-pay | Admitting: Nurse Practitioner

## 2024-12-11 DIAGNOSIS — E88819 Insulin resistance, unspecified: Secondary | ICD-10-CM

## 2024-12-12 ENCOUNTER — Encounter (INDEPENDENT_AMBULATORY_CARE_PROVIDER_SITE_OTHER): Payer: Self-pay | Admitting: Nurse Practitioner

## 2024-12-12 ENCOUNTER — Ambulatory Visit (INDEPENDENT_AMBULATORY_CARE_PROVIDER_SITE_OTHER): Admitting: Nurse Practitioner

## 2024-12-12 VITALS — BP 132/87 | HR 80 | Temp 98.1°F | Ht 62.0 in | Wt 238.0 lb

## 2024-12-12 DIAGNOSIS — I10 Essential (primary) hypertension: Secondary | ICD-10-CM | POA: Diagnosis not present

## 2024-12-12 DIAGNOSIS — E88819 Insulin resistance, unspecified: Secondary | ICD-10-CM

## 2024-12-12 DIAGNOSIS — G4733 Obstructive sleep apnea (adult) (pediatric): Secondary | ICD-10-CM | POA: Diagnosis not present

## 2024-12-12 DIAGNOSIS — E782 Mixed hyperlipidemia: Secondary | ICD-10-CM | POA: Diagnosis not present

## 2024-12-12 DIAGNOSIS — I69351 Hemiplegia and hemiparesis following cerebral infarction affecting right dominant side: Secondary | ICD-10-CM

## 2024-12-12 DIAGNOSIS — E559 Vitamin D deficiency, unspecified: Secondary | ICD-10-CM

## 2024-12-12 DIAGNOSIS — E66813 Obesity, class 3: Secondary | ICD-10-CM | POA: Diagnosis not present

## 2024-12-12 DIAGNOSIS — Z6841 Body Mass Index (BMI) 40.0 and over, adult: Secondary | ICD-10-CM | POA: Diagnosis not present

## 2024-12-12 MED ORDER — METFORMIN HCL ER 500 MG PO TB24: 500.0000 mg | ORAL_TABLET | Freq: Two times a day (BID) | ORAL | 0 refills | Status: AC

## 2024-12-12 MED ORDER — VITAMIN D (ERGOCALCIFEROL) 1.25 MG (50000 UNIT) PO CAPS: 50000.0000 [IU] | ORAL_CAPSULE | ORAL | 1 refills | Status: AC

## 2024-12-12 NOTE — Progress Notes (Signed)
 " Office: [phone]  /  Fax: [phone]  WEIGHT SUMMARY AND BIOMETRICS  Weight Lost Since Last Visit: 3 lb  Weight Gained Since Last Visit: 0   Vitals Temp: 98.1 F (36.7 C) BP: 132/87 Pulse Rate: 80 SpO2: 95 %   Anthropometric Measurements Height: 5' 2 (1.575 m) Weight: 238 lb (108 kg) BMI (Calculated): 43.52 Weight at Last Visit: 241 lb Weight Lost Since Last Visit: 3 lb Weight Gained Since Last Visit: 0 Starting Weight: 236 lb Total Weight Loss (lbs): 0 lb (0 kg) Waist Measurement : 46 inches   Body Composition  Body Fat %: 50.4 % Fat Mass (lbs): 120.4 lbs Muscle Mass (lbs): 112.4 lbs Total Body Water (lbs): 85.8 lbs Visceral Fat Rating : 14   Other Clinical Data RMR: 1946 Fasting: no Labs: no Today's Visit #: 3 Starting Date: 10/30/24    Total Weight Loss: 0 Bio Impedance Data reviewed with patient: Muscle is up 0.8 pounds, adipose is down 3.4 pounds. Visceral fat rating decreased 1 point from 15 to 14.   HPI  Chief Complaint: OBESITY  Laporshia is here to discuss her progress with her obesity treatment plan. She is on the the Category 1 Plan and states she is following her eating plan approximately 50 % of the time. She states she is not currently exercising- bad knees, hemiparesis   Interval History:  Since last office visit she did eat off plan during the holidays. She had macaroni and cheese, mashed potatoes, green bean casseroles, turkey and ham.   She does try to aim for 100 grams of protein daily and most days does calorie goal but did worse during the holidays. She is drinking at least 80 ounces of water daily.  She has skipped a couple meals but mostly sticks to plan. She has not been at the gym- her right knee has been bothering her more.  She does plan to start recumbent bike at the gym once her kids are back in school.   She started on Metformin  XR 500 mg every day for insulin  resistance and denies side effects.  She is willing to go up  to BID.   She started Ergocalciferol  50000 units once a week for Vit D deficiency. She denies side effects.   Zarie does have hypertension and her BP's are currently well controlled on amlodipine  5 mg every day. Denies headaches, chest pain shortness of breath at rest and dizziness  BP Readings from Last 3 Encounters:  12/12/24 132/87  11/13/24 121/81  10/30/24 117/79   She continues to wear her CPAP 100 % of the time.    She continues to work on nutrition, exercise and wight loss to help lower lipid levels.  Lab Results  Component Value Date   CHOL 198 10/30/2024   HDL 46 10/30/2024   LDLCALC 113 (H) 10/30/2024   LDLDIRECT 161.0 12/16/2021   TRIG 223 (H) 10/30/2024   CHOLHDL 4.3 10/30/2024     PHYSICAL EXAM:  Blood pressure 132/87, pulse 80, temperature 98.1 F (36.7 C), height 5' 2 (1.575 m), weight 238 lb (108 kg), SpO2 95%. Body mass index is 43.53 kg/m.  General: Well Developed, well nourished, and in no acute distress.  HEENT: Normocephalic, atraumatic; EOMI, sclerae are anicteric. Skin: Warm and dry, good turgor Chest:  Normal excursion, shape, no gross ABN Respiratory: No conversational dyspnea; speaking in full sentences NeuroM-Sk:  Normal gross ROM * 4 extremities  Psych: A and O X 3, insight adequate, mood- full  DIAGNOSTIC DATA REVIEWED:  BMET    Component Value Date/Time   NA 141 10/30/2024 0924   K 4.6 10/30/2024 0924   CL 102 10/30/2024 0924   CO2 23 10/30/2024 0924   GLUCOSE 89 10/30/2024 0924   GLUCOSE 137 (H) 07/03/2024 0721   BUN 16 10/30/2024 0924   CREATININE 0.60 10/30/2024 0924   CREATININE 0.43 (L) 12/05/2014 1148   CALCIUM  9.8 10/30/2024 0924   GFRNONAA >60 07/03/2024 0721   GFRAA >60 09/12/2019 2129   Lab Results  Component Value Date   HGBA1C 4.9 10/30/2024   HGBA1C 5.2 04/11/2012   Lab Results  Component Value Date   INSULIN  19.5 10/30/2024   Lab Results  Component Value Date   TSH 2.130 10/30/2024   CBC     Component Value Date/Time   WBC 10.6 10/30/2024 0924   WBC 8.0 07/18/2024 0847   RBC 5.00 10/30/2024 0924   RBC 4.84 07/18/2024 0847   HGB 15.0 10/30/2024 0924   HCT 46.1 10/30/2024 0924   PLT 563 (H) 10/30/2024 0924   MCV 92 10/30/2024 0924   MCH 30.0 10/30/2024 0924   MCH 30.0 07/03/2024 0721   MCHC 32.5 10/30/2024 0924   MCHC 34.5 07/18/2024 0847   RDW 12.4 10/30/2024 0924   Iron Studies No results found for: IRON, TIBC, FERRITIN, IRONPCTSAT Lipid Panel     Component Value Date/Time   CHOL 198 10/30/2024 0924   TRIG 223 (H) 10/30/2024 0924   HDL 46 10/30/2024 0924   CHOLHDL 4.3 10/30/2024 0924   CHOLHDL 5 04/26/2024 1047   VLDL 33.6 04/26/2024 1047   LDLCALC 113 (H) 10/30/2024 0924   LDLDIRECT 161.0 12/16/2021 0856   Hepatic Function Panel     Component Value Date/Time   PROT 7.1 10/30/2024 0924   ALBUMIN 4.7 10/30/2024 0924   AST 16 10/30/2024 0924   ALT 18 10/30/2024 0924   ALKPHOS 87 10/30/2024 0924   BILITOT 1.0 10/30/2024 0924   BILIDIR 0.3 07/18/2024 0847      Component Value Date/Time   TSH 2.130 10/30/2024 0924   Nutritional Lab Results  Component Value Date   VD25OH 16.4 (L) 10/30/2024     ASSESSMENT AND PLAN Class 3 severe obesity with serious comorbidity and body mass index (BMI) of 40.0 to 44.9 in adult, unspecified obesity type (HCC)  TREATMENT PLAN FOR OBESITY:  Recommended Dietary Goals  Babe is currently in the action stage of change. As such, her goal is to continue weight management plan. She has agreed to the Category 1 Plan.  Behavioral Intervention  We discussed the following Behavioral Modification Strategies today: increasing lean protein intake to established goals, increasing fiber rich foods, avoiding skipping meals, increasing water intake , decreasing eating out or consumption of processed foods, and making healthy choices when eating convenient foods, practice mindfulness eating and understand the difference  between hunger signals and cravings, continue to work on maintaining a reduced calorie state, getting the recommended amount of protein, incorporating whole foods, making healthy choices, staying well hydrated and practicing mindfulness when eating., and increase protein intake, fibrous foods (25 grams per day for women, 30 grams for men) and water to improve satiety and decrease hunger signals. .More hunger at night and wanting to snack- increase Metformin  to BID and if no change may add topiramate or Wellbutrin    Recommended Physical Activity Goals  Duru has been advised to work up to 150 minutes of moderate intensity aerobic activity a week and strengthening exercises  2-3 times per week for cardiovascular health, weight loss maintenance and preservation of muscle mass.   She has agreed to Increase physical activity in their day and reduce sedentary time (increase NEAT). and Start aerobic activity with a goal of 150 minutes a week at moderate intensity.    Pharmacotherapy We discussed various medication options to help Miciah with her weight loss efforts and we both agreed to increase Metformin  XR 500 mg to BID for insulin  resistance and off label for weight management- denies side effects. Continue Ergocalciferol  50000 units once a week for Vit D deficiency- denies side effects with medication.  ASSOCIATED CONDITIONS ADDRESSED TODAY  Action/Plan  Essential hypertension Continue amlodipine  5 mg every day Continue Category 1 meal plan  and DASH diet Monitor BP and if consistently >140/90 notify PCP If develops headaches, chest pain, shortness of breath or dizziness go to ER Continue to follow regularly with PCP  OSA on CPAP       Use CPAP 100% of the time       Decreasing body weight by 10-15% can improve AHI   Insulin  resistance Continue Category 1  meal plan, limit simple carbohydrates. Increase lean protein, water and fiber Increase Metformin  XR 500 mg to BID- denies side  effects Continue exercise with current goal of 150 minutes of moderate to high intensity exercise/week.  -     metFORMIN  HCl ER; Take 1 tablet (500 mg total) by mouth 2 (two) times daily with a meal.  Dispense: 60 tablet; Refill: 0  Mixed hyperlipidemia Focus on implementing category 1 meal plan, limit saturated fats.Increase lean protein, water and fiber Focus on getting 150 minutes a week of moderate to high intensity exercise  Continue to follow regularly with PCP  Hemiparesis affecting right side as late effect of cerebrovascular accident [facility])       Continue to follow regularly with Neurology- wear brace and increase activity as tolerated.    Vitamin D  deficiency Continue to supplement with Ergocalciferol  50000 units once a week  Low vitamin D  levels can be associated with adiposity and may result in leptin resistance and weight gain. Also associated with fatigue.  Currently on vitamin D  supplementation without any adverse effects such as nausea, vomiting or muscle weakness.   -     Vitamin D  (Ergocalciferol ); Take 1 capsule (50,000 Units total) by mouth every 7 (seven) days.  Dispense: 5 capsule; Refill: 1         Return in about 4 weeks (around 01/09/2025).SABRA She was informed of the importance of frequent follow up visits to maximize her success with intensive lifestyle modifications for her multiple health conditions.   ATTESTASTION STATEMENTS:  Reviewed by clinician on day of visit: allergies, medications, problem list, medical history, surgical history, family history, social history, and previous encounter notes.    Lonell Liverpool ANP-C "

## 2024-12-28 ENCOUNTER — Encounter: Payer: Self-pay | Admitting: Internal Medicine

## 2024-12-28 ENCOUNTER — Ambulatory Visit: Payer: BC Managed Care – PPO | Admitting: Internal Medicine

## 2024-12-28 VITALS — BP 130/86 | HR 71 | Temp 97.7°F | Ht 62.0 in | Wt 240.0 lb

## 2024-12-28 DIAGNOSIS — Z6841 Body Mass Index (BMI) 40.0 and over, adult: Secondary | ICD-10-CM | POA: Diagnosis not present

## 2024-12-28 DIAGNOSIS — Z23 Encounter for immunization: Secondary | ICD-10-CM | POA: Diagnosis not present

## 2024-12-28 DIAGNOSIS — Z0000 Encounter for general adult medical examination without abnormal findings: Secondary | ICD-10-CM

## 2024-12-28 DIAGNOSIS — E559 Vitamin D deficiency, unspecified: Secondary | ICD-10-CM

## 2024-12-28 DIAGNOSIS — E785 Hyperlipidemia, unspecified: Secondary | ICD-10-CM | POA: Diagnosis not present

## 2024-12-28 DIAGNOSIS — E88819 Insulin resistance, unspecified: Secondary | ICD-10-CM

## 2024-12-28 DIAGNOSIS — I10 Essential (primary) hypertension: Secondary | ICD-10-CM

## 2024-12-28 DIAGNOSIS — E782 Mixed hyperlipidemia: Secondary | ICD-10-CM

## 2024-12-28 DIAGNOSIS — G4733 Obstructive sleep apnea (adult) (pediatric): Secondary | ICD-10-CM

## 2024-12-28 DIAGNOSIS — Z1159 Encounter for screening for other viral diseases: Secondary | ICD-10-CM

## 2024-12-28 DIAGNOSIS — I69351 Hemiplegia and hemiparesis following cerebral infarction affecting right dominant side: Secondary | ICD-10-CM

## 2024-12-28 LAB — CBC WITH DIFFERENTIAL/PLATELET
Basophils Absolute: 0.1 K/uL (ref 0.0–0.1)
Basophils Relative: 1 % (ref 0.0–3.0)
Eosinophils Absolute: 0.3 K/uL (ref 0.0–0.7)
Eosinophils Relative: 3.4 % (ref 0.0–5.0)
HCT: 43.8 % (ref 36.0–46.0)
Hemoglobin: 15.1 g/dL — ABNORMAL HIGH (ref 12.0–15.0)
Lymphocytes Relative: 31.7 % (ref 12.0–46.0)
Lymphs Abs: 2.8 K/uL (ref 0.7–4.0)
MCHC: 34.4 g/dL (ref 30.0–36.0)
MCV: 87.6 fl (ref 78.0–100.0)
Monocytes Absolute: 0.5 K/uL (ref 0.1–1.0)
Monocytes Relative: 5.7 % (ref 3.0–12.0)
Neutro Abs: 5.1 K/uL (ref 1.4–7.7)
Neutrophils Relative %: 58.2 % (ref 43.0–77.0)
Platelets: 447 K/uL — ABNORMAL HIGH (ref 150.0–400.0)
RBC: 5 Mil/uL (ref 3.87–5.11)
RDW: 12.6 % (ref 11.5–15.5)
WBC: 8.7 K/uL (ref 4.0–10.5)

## 2024-12-28 LAB — LIPID PANEL
Cholesterol: 184 mg/dL (ref 28–200)
HDL: 43.2 mg/dL
LDL Cholesterol: 110 mg/dL — ABNORMAL HIGH (ref 10–99)
NonHDL: 141.27
Total CHOL/HDL Ratio: 4
Triglycerides: 157 mg/dL — ABNORMAL HIGH (ref 10.0–149.0)
VLDL: 31.4 mg/dL (ref 0.0–40.0)

## 2024-12-28 LAB — VITAMIN D 25 HYDROXY (VIT D DEFICIENCY, FRACTURES): VITD: 21.24 ng/mL — ABNORMAL LOW (ref 30.00–100.00)

## 2024-12-28 MED ORDER — ALBUTEROL SULFATE HFA 108 (90 BASE) MCG/ACT IN AERS: 1.0000 | INHALATION_SPRAY | Freq: Four times a day (QID) | RESPIRATORY_TRACT | 1 refills | Status: AC | PRN

## 2024-12-28 MED ORDER — AMLODIPINE BESYLATE 5 MG PO TABS: ORAL_TABLET | ORAL | 1 refills | Status: AC

## 2024-12-28 MED ORDER — CITALOPRAM HYDROBROMIDE 40 MG PO TABS: ORAL_TABLET | ORAL | 1 refills | Status: AC

## 2024-12-28 NOTE — Patient Instructions (Signed)
 Good to see you today    Continue lifestyle intervention healthy eating and exercise .  Fu lipids  today aand hep c screen  Meds refilled . Plan 6 mos fu or as indicated

## 2024-12-28 NOTE — Progress Notes (Signed)
 "  Chief Complaint  Patient presents with   Annual Exam    HPI: Patient  Tina Mckee  34 y.o. comes in today for Preventive Health Care visit   Update  no injury   feels ok better since going to gym  Weight management    ongoing. .  Now on metformin   Vit d is low so placed onmed in November  OSA  better with mask  Donates blood but would like hep c screen that flag in system  Plans of mammogram  with gyne with family hx concerns and she gets cysts in breast Has HS and doesn't shave under arms as much   Health Maintenance  Topic Date Due   Hepatitis C Screening  Never done   COVID-19 Vaccine (6 - 2025-26 season) 01/13/2025 (Originally 08/14/2024)   Cervical Cancer Screening (HPV/Pap Cotest)  03/28/2025 (Originally 10/14/2019)   DTaP/Tdap/Td (10 - Td or Tdap) 12/28/2034   Influenza Vaccine  Completed   Hepatitis B Vaccines 19-59 Average Risk  Completed   HPV VACCINES (No Doses Required) Completed   HIV Screening  Completed   Pneumococcal Vaccine  Aged Out   Meningococcal B Vaccine  Aged Out   Health Maintenance Review LIFESTYLE:  Exercise:   gym  5 d per week recumbent bike 30 +  Tobacco/ETS: n Alcohol:  rare Sugar beverages: water Sleep:  about 7    osa   mask better  Drug use: no HH of  4  + dog  husb works out of town .  2 girls at home 15 and 10 ages    ROS:  GEN/ HEENT: No fever, significant weight changes sweats headaches vision problems hearing changes, CV/ PULM; No chest pain shortness of breath cough, syncope,edema  change in exercise tolerance. GI /GU: No adominal pain, vomiting, change in bowel habits. No blood in the stool. No significant GU symptoms. SKIN/HEME: ,no acute skin rashes suspicious lesions or bleeding. No lymphadenopathy, nodules, masses.  NEURO/ PSYCH:  No neurologic signs such as weakness numbness. No depression anxiety. IMM/ Allergy: No unusual infections.  Allergy .   REST of 12 system review negative except as per HPI   Past Medical  History:  Diagnosis Date   Anxiety    Asthma    At risk for falling    Back pain    Depression    Gall bladder disease    Gestational hypertension    High cholesterol    HS (hereditary spherocytosis)    Obesity    Perinatal arterial ischemic stroke (HCC)    Prediabetes    Right sided weakness    Scoliosis    Sleep apnea    Stroke [facility]) birth   rt hemiparesis  Perinatal   Supervision of other normal pregnancy 05/04/2014    Past Surgical History:  Procedure Laterality Date   CHOLECYSTECTOMY N/A 07/02/2024   Procedure: LAPAROSCOPIC CHOLECYSTECTOMY WITH INTRAOPERATIVE CHOLANGIOGRAM;  Surgeon: Ann Fine, MD;  Location: [facility] OR;  Service: General;  Laterality: N/A;   FOOT SURGERY     rt foot   FOOT SURGERY     Bone placed   moles removed     from back   wisdom teeth removal     2007    Family History  Problem Relation Age of Onset   Sleep apnea Mother    Hyperlipidemia Mother    Hypertension Mother    Asthma Mother    Anxiety disorder Mother    Hyperlipidemia Father  Hypertension Father    Sleep apnea Father    Asthma Sister    Other Maternal Grandmother        heart problems   Cancer Maternal Grandfather        pancreatic   Cancer Paternal Grandmother        cancer all over   Other Paternal Grandfather        brain damage    Social History   Socioeconomic History   Marital status: Married    Spouse name: Dustin   Number of children: 2   Years of education: Not on file   Highest education level: Some college, no degree  Occupational History   Occupation: Clinical Biochemist   Occupation: stay at home spouse  Tobacco Use   Smoking status: Former    Current packs/day: 0.00    Types: Cigarettes    Quit date: 07/11/2007    Years since quitting: 17.4   Smokeless tobacco: Never  Vaping Use   Vaping status: Never Used  Substance and Sexual Activity   Alcohol use: Yes    Alcohol/week: 1.0 standard drink of alcohol    Types: 1 Standard drinks or  equivalent per week    Comment: once a month   Drug use: No   Sexual activity: Yes    Birth control/protection: I.U.D.  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     Currently nonsmoking    HH of 4 no pets     had mild preeclampsia with vaginal childbirth.  2011   cb 12 15 preeclampsi no complicatinos   ocass etoh.    Exercise  Walking     Sleep ok    caffiene limited per day .    Left handed            Social Drivers of Health   Tobacco Use: Medium Risk (12/28/2024)   Patient History    Smoking Tobacco Use: Former    Smokeless Tobacco Use: Never    Passive Exposure: Not on file  Financial Resource Strain: Low Risk (09/27/2024)   Overall Financial Resource Strain (CARDIA)    Difficulty of Paying Living Expenses: Not hard at all  Food Insecurity: Unknown (09/27/2024)   Epic    Worried About Programme Researcher, Broadcasting/film/video in the Last Year: Not on file    [employer] of Food in the Last Year: Never true  Transportation Needs: No Transportation Needs (09/27/2024)   Epic    Lack of Transportation (Medical): No    Lack of Transportation (Non-Medical): No  Physical Activity: Sufficiently Active (09/27/2024)   Exercise Vital Sign    Days of Exercise per Week: 5 days    Minutes of Exercise per Session: 40 min  Stress: Stress Concern Present (09/27/2024)   [employer] of Occupational Health - Occupational Stress Questionnaire    Feeling of Stress: To some extent  Social Connections: Moderately Integrated (09/27/2024)   Social Connection and Isolation Panel    Frequency of Communication with Friends and Family: More than three times a week    Frequency of Social Gatherings with Friends and Family: Twice a week    Attends Religious Services: Never    Database Administrator or Organizations: Yes    Attends Engineer, Structural: More than 4 times per year    Marital  Status: Married  Depression (PHQ2-9): High Risk (12/28/2024)   Depression (PHQ2-9)    PHQ-2 Score: 14  Alcohol Screen: Low Risk (09/27/2024)   Alcohol Screen    Last Alcohol Screening Score (AUDIT): 1  Housing: Low Risk (09/27/2024)   Epic    Unable to Pay for Housing in the Last Year: No    Number of Times Moved in the Last Year: 0    Homeless in the Last Year: No  Utilities: Not At Risk (07/01/2024)   Epic    Threatened with loss of utilities: No  Health Literacy: Not on file    Outpatient Medications Prior to Visit  Medication Sig Dispense Refill   acetaminophen  (TYLENOL ) 500 MG tablet Take 2 tablets (1,000 mg total) by mouth every 6 (six) hours as needed for mild pain (pain score 1-3) or moderate pain (pain score 4-6).     betamethasone , augmented, (DIPROLENE ) 0.05 % lotion Apply 1 Application topically daily as needed (Irritation).     bismuth subsalicylate (PEPTO BISMOL) 262 MG chewable tablet Chew 524 mg by mouth as needed for indigestion.     calcium  carbonate (TUMS - DOSED IN MG ELEMENTAL CALCIUM ) 500 MG chewable tablet Chew 1 tablet by mouth as needed for indigestion or heartburn.     clonazePAM  (KLONOPIN ) 0.5 MG tablet TAKE 1/2 TABLET(0.25 MG) BY MOUTH TWICE DAILY 30 tablet 2   fluticasone  (FLONASE ) 50 MCG/ACT nasal spray SHAKE LIQUID AND USE 1 SPRAY IN EACH NOSTRIL TWICE DAILY 16 g 0   levonorgestrel  (MIRENA , 52 MG,) 20 MCG/DAY IUD Provided by Care Center     metFORMIN  (GLUCOPHAGE -XR) 500 MG 24 hr tablet Take 1 tablet (500 mg total) by mouth 2 (two) times daily with a meal. 60 tablet 0   Multiple Vitamins-Calcium  (ONE-A-DAY WOMENS PO) Take 1 tablet by mouth daily.     Vitamin D , Ergocalciferol , (DRISDOL ) 1.25 MG (50000 UNIT) CAPS capsule Take 1 capsule (50,000 Units total) by mouth every 7 (seven) days. 5 capsule 1   VITAMIN E PO Take 1 tablet by mouth daily.     amLODipine  (NORVASC ) 5 MG tablet TAKE 1 TABLET(5 MG) BY MOUTH DAILY 90 tablet 1   citalopram  (CELEXA ) 40 MG  tablet TAKE 1 TABLET(40 MG) BY MOUTH DAILY 30 tablet 3   PROAIR  HFA 108 (90 Base) MCG/ACT inhaler INHALE 1-2 PUFFS INTO THE LUNGS EVERY 6 (SIX) HOURS AS NEEDED FOR WHEEZING OR SHORTNESS OF BREATH. 8.5 Inhaler 0   Ciclopirox 1 % shampoo Apply 1 Application topically 3 (three) times a week. (Patient not taking: Reported on 12/28/2024)     No facility-administered medications prior to visit.     EXAM:  BP 130/86 (BP Location: Left Arm, Patient Position: Sitting, Cuff Size: Large)   Pulse 71   Temp 97.7 F (36.5 C) (Oral)   Ht 5' 2 (1.575 m)   Wt 240 lb (108.9 kg)   SpO2 95%   BMI 43.90 kg/m   Body mass index is 43.9 kg/m. Wt Readings from Last 3 Encounters:  12/28/24 240 lb (108.9 kg)  12/12/24 238 lb (108 kg)  11/13/24 241 lb (109.3 kg)    Physical Exam: Vital signs reviewed HZW:Uypd is a well-developed well-nourished alert cooperative    who appearsr stated age in no acute distress.  HEENT: normocephalic atraumatic , Eyes: PERRL EOM's full, conjunctiva clear, Nares: paten,t no deformity discharge or tenderness., Ears: no deformity EAC's clear TMs with normal landmarks. Mouth: clear OP, no lesions, edema.  Moist mucous membranes. Dentition  in adequate repair. NECK: supple without masses, thyromegaly or bruits. CHEST/PULM:  Clear to auscultation and percussion breath sounds equal no wheeze , rales or rhonchi. Breast: normal by inspection . No dimpling, discharge, masses, tenderness or discharge . CV: PMI is nondisplaced, S1 S2 no gallops, murmurs, rubs. Peripheral pulses are full without delay.  ABDOMEN: Bowel sounds normal nontender  No guard or rebound, no hepato splenomegal no CVA tenderness.   Extremtities:  No clubbing cyanosis or edema,  r hemiparesis and atrophy changes no acute joint swelling or redness  NEURO:  Oriented x3, cranial nerves 3-12 appear to be intact,  r hemiparesis   independent gait with  afo support RLE SKIN: No acute rashes normal turgor, color, no  bruising or petechiae. PSYCH: Oriented, good eye contact, no obvious depression anxiety, cognition and judgment appear normal. LN: no cervical axillary adenopathy  Lab Results  Component Value Date   WBC 8.7 12/28/2024   HGB 15.1 (H) 12/28/2024   HCT 43.8 12/28/2024   PLT 447.0 (H) 12/28/2024   GLUCOSE 89 10/30/2024   CHOL 184 12/28/2024   TRIG 157.0 (H) 12/28/2024   HDL 43.20 12/28/2024   LDLDIRECT 161.0 12/16/2021   LDLCALC 110 (H) 12/28/2024   ALT 18 10/30/2024   AST 16 10/30/2024   NA 141 10/30/2024   K 4.6 10/30/2024   CL 102 10/30/2024   CREATININE 0.60 10/30/2024   BUN 16 10/30/2024   CO2 23 10/30/2024   TSH 2.130 10/30/2024   HGBA1C 4.9 10/30/2024    BP Readings from Last 3 Encounters:  12/28/24 130/86  12/12/24 132/87  11/13/24 121/81    Lab plan  reviewed with patient   ASSESSMENT AND PLAN:  Discussed the following assessment and plan:    ICD-10-CM   1. Visit for preventive health examination  Z00.00 Vitamin D , 25-hydroxy    Lipid Panel    CBC with Differential/Platelet    2. Encounter for hepatitis C screening test for low risk patient  Z11.59 Hep C Antibody    3. Flu vaccine need  Z23 Flu vaccine trivalent PF, 6mos and older(Flulaval,Afluria,Fluarix,Fluzone)    4. Vitamin D  deficiency  E55.9 Vitamin D , 25-hydroxy    5. Insulin  resistance  E88.819 Vitamin D , 25-hydroxy    Lipid Panel    CBC with Differential/Platelet    6. Essential hypertension  I10     7. Hyperlipidemia, unspecified hyperlipidemia type  E78.5 Vitamin D , 25-hydroxy    Lipid Panel    CBC with Differential/Platelet    Lipoprotein A (LPA)    8. OSA on CPAP  G47.33     9. Mixed hyperlipidemia  E78.2     10. Hemiparesis affecting right side as late effect of cerebrovascular accident (HCC)  I69.351     11. Morbid obesity (HCC)  E66.01     12. Need for Tdap vaccination  Z23     Over all  trending in  better direction Mood  improved and exercise helpful. If wegovy  or  zepbound   becomes available  she would be a good candidate  Fasting lab today . Fu 6-12 mos depending   ok to continue citalopram   as seems to be stable conditions.   Return in about 6 months (around 06/27/2025) for depending on results.  Patient Care Team: Trinaty Bundrick, Apolinar POUR, MD as PCP - General Susen Elsie DEL, MD (Inactive) (Neurology) Barbarann Oneil BROCKS, MD (Inactive) (Orthopedic Surgery) Leva Rush, MD as Consulting Physician (Obstetrics and Gynecology) Parrett, Madelin RAMAN, NP as Nurse Practitioner (  Pulmonary Disease) Skeet Juliene SAUNDERS, DO as Consulting Physician (Neurology) Hope Almarie ORN, NP as Nurse Practitioner (Pulmonary Disease) Patient Instructions  Good to see you today    Continue lifestyle intervention healthy eating and exercise .  Fu lipids  today aand hep c screen  Meds refilled . Plan 6 mos fu or as indicated   Nihira Puello K. Abem Shaddix M.D.  "

## 2024-12-29 ENCOUNTER — Ambulatory Visit: Payer: Self-pay | Admitting: Internal Medicine

## 2024-12-29 NOTE — Progress Notes (Signed)
 Ldl cholesterol improving Blood count  platelets  about the same  Vit d still low but improving  continue vit d as planned by weight management  can take a while to get levels up.

## 2025-01-05 LAB — HEPATITIS C ANTIBODY: Hepatitis C Ab: NONREACTIVE

## 2025-01-05 LAB — LIPOPROTEIN A (LPA): Lipoprotein (a): 53 nmol/L

## 2025-01-05 NOTE — Progress Notes (Signed)
Lipo a is in favorable range

## 2025-01-08 ENCOUNTER — Ambulatory Visit (INDEPENDENT_AMBULATORY_CARE_PROVIDER_SITE_OTHER): Admitting: Nurse Practitioner

## 2025-01-08 ENCOUNTER — Encounter (INDEPENDENT_AMBULATORY_CARE_PROVIDER_SITE_OTHER): Payer: Self-pay

## 2025-01-09 ENCOUNTER — Other Ambulatory Visit (INDEPENDENT_AMBULATORY_CARE_PROVIDER_SITE_OTHER): Payer: Self-pay | Admitting: Nurse Practitioner

## 2025-01-09 DIAGNOSIS — E88819 Insulin resistance, unspecified: Secondary | ICD-10-CM

## 2025-01-18 ENCOUNTER — Other Ambulatory Visit: Payer: Self-pay | Admitting: Obstetrics and Gynecology

## 2025-01-18 DIAGNOSIS — N6323 Unspecified lump in the left breast, lower outer quadrant: Secondary | ICD-10-CM

## 2025-01-23 ENCOUNTER — Ambulatory Visit (INDEPENDENT_AMBULATORY_CARE_PROVIDER_SITE_OTHER): Admitting: Nurse Practitioner

## 2025-01-26 ENCOUNTER — Other Ambulatory Visit

## 2025-01-26 ENCOUNTER — Encounter

## 2025-02-19 ENCOUNTER — Ambulatory Visit: Admitting: Neurology

## 2025-02-22 ENCOUNTER — Ambulatory Visit: Admitting: Neurology

## 2026-01-01 ENCOUNTER — Encounter: Admitting: Internal Medicine
